# Patient Record
Sex: Female | Born: 1945 | Race: Black or African American | Hispanic: No | Smoking: Former smoker
Health system: Southern US, Community
[De-identification: ages and names within clinical notes are randomized; demographics above are authoritative.]

## PROBLEM LIST (undated history)

## (undated) DIAGNOSIS — N189 Chronic kidney disease, unspecified: Secondary | ICD-10-CM

## (undated) DIAGNOSIS — N200 Calculus of kidney: Secondary | ICD-10-CM

## (undated) DIAGNOSIS — K219 Gastro-esophageal reflux disease without esophagitis: Secondary | ICD-10-CM

## (undated) DIAGNOSIS — E782 Mixed hyperlipidemia: Secondary | ICD-10-CM

## (undated) DIAGNOSIS — I340 Nonrheumatic mitral (valve) insufficiency: Secondary | ICD-10-CM

## (undated) DIAGNOSIS — I5189 Other ill-defined heart diseases: Secondary | ICD-10-CM

## (undated) DIAGNOSIS — E1122 Type 2 diabetes mellitus with diabetic chronic kidney disease: Secondary | ICD-10-CM

## (undated) DIAGNOSIS — F329 Major depressive disorder, single episode, unspecified: Secondary | ICD-10-CM

## (undated) DIAGNOSIS — I071 Rheumatic tricuspid insufficiency: Secondary | ICD-10-CM

## (undated) DIAGNOSIS — I639 Cerebral infarction, unspecified: Secondary | ICD-10-CM

## (undated) DIAGNOSIS — N289 Disorder of kidney and ureter, unspecified: Secondary | ICD-10-CM

## (undated) DIAGNOSIS — F32A Depression, unspecified: Secondary | ICD-10-CM

## (undated) DIAGNOSIS — D631 Anemia in chronic kidney disease: Secondary | ICD-10-CM

## (undated) DIAGNOSIS — N10 Acute tubulo-interstitial nephritis: Secondary | ICD-10-CM

## (undated) DIAGNOSIS — I739 Peripheral vascular disease, unspecified: Secondary | ICD-10-CM

## (undated) DIAGNOSIS — I10 Essential (primary) hypertension: Secondary | ICD-10-CM

## (undated) DIAGNOSIS — E118 Type 2 diabetes mellitus with unspecified complications: Secondary | ICD-10-CM

## (undated) DIAGNOSIS — N1832 Chronic kidney disease, stage 3b: Secondary | ICD-10-CM

## (undated) HISTORY — PX: KIDNEY STONE SURGERY: SHX686

## (undated) HISTORY — DX: Calculus of kidney: N20.0

## (undated) HISTORY — DX: Essential (primary) hypertension: I10

## (undated) HISTORY — DX: Type 2 diabetes mellitus with unspecified complications: E11.8

## (undated) HISTORY — PX: CHOLECYSTECTOMY: SHX55

## (undated) HISTORY — DX: Mixed hyperlipidemia: E78.2

---
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
## 2006-11-29 ENCOUNTER — Emergency Department: Payer: Self-pay | Admitting: Emergency Medicine

## 2016-05-21 DIAGNOSIS — I639 Cerebral infarction, unspecified: Secondary | ICD-10-CM

## 2016-05-21 HISTORY — DX: Cerebral infarction, unspecified: I63.9

## 2016-10-01 ENCOUNTER — Inpatient Hospital Stay: Payer: Medicare Other

## 2016-10-01 ENCOUNTER — Emergency Department: Payer: Medicare Other

## 2016-10-01 ENCOUNTER — Encounter: Payer: Self-pay | Admitting: Emergency Medicine

## 2016-10-01 ENCOUNTER — Inpatient Hospital Stay
Admission: EM | Admit: 2016-10-01 | Discharge: 2016-10-02 | DRG: 065 | Disposition: A | Discharge status: Home / Self Care | Payer: Medicare Other | Attending: Internal Medicine | Admitting: Internal Medicine

## 2016-10-01 DIAGNOSIS — Z66 Do not resuscitate: Secondary | ICD-10-CM | POA: Diagnosis present

## 2016-10-01 DIAGNOSIS — Z8249 Family history of ischemic heart disease and other diseases of the circulatory system: Secondary | ICD-10-CM

## 2016-10-01 DIAGNOSIS — Z23 Encounter for immunization: Secondary | ICD-10-CM

## 2016-10-01 DIAGNOSIS — Z87891 Personal history of nicotine dependence: Secondary | ICD-10-CM | POA: Diagnosis not present

## 2016-10-01 DIAGNOSIS — R7301 Impaired fasting glucose: Secondary | ICD-10-CM | POA: Diagnosis present

## 2016-10-01 DIAGNOSIS — N390 Urinary tract infection, site not specified: Secondary | ICD-10-CM

## 2016-10-01 DIAGNOSIS — Z8673 Personal history of transient ischemic attack (TIA), and cerebral infarction without residual deficits: Secondary | ICD-10-CM | POA: Diagnosis present

## 2016-10-01 DIAGNOSIS — I10 Essential (primary) hypertension: Secondary | ICD-10-CM | POA: Diagnosis present

## 2016-10-01 DIAGNOSIS — H6122 Impacted cerumen, left ear: Secondary | ICD-10-CM | POA: Diagnosis present

## 2016-10-01 DIAGNOSIS — I639 Cerebral infarction, unspecified: Secondary | ICD-10-CM | POA: Diagnosis not present

## 2016-10-01 DIAGNOSIS — N3001 Acute cystitis with hematuria: Secondary | ICD-10-CM | POA: Diagnosis present

## 2016-10-01 DIAGNOSIS — I6350 Cerebral infarction due to unspecified occlusion or stenosis of unspecified cerebral artery: Secondary | ICD-10-CM | POA: Diagnosis not present

## 2016-10-01 DIAGNOSIS — R42 Dizziness and giddiness: Secondary | ICD-10-CM

## 2016-10-01 LAB — URINALYSIS, COMPLETE (UACMP) WITH MICROSCOPIC
Bilirubin Urine: NEGATIVE
Ketones, ur: NEGATIVE mg/dL
NITRITE: NEGATIVE
Protein, ur: 100 mg/dL — AB
Specific Gravity, Urine: 1.017 (ref 1.005–1.030)
pH: 5 (ref 5.0–8.0)

## 2016-10-01 LAB — BASIC METABOLIC PANEL
ANION GAP: 10 (ref 5–15)
BUN: 19 mg/dL (ref 6–20)
CALCIUM: 9.7 mg/dL (ref 8.9–10.3)
CO2: 23 mmol/L (ref 22–32)
CREATININE: 0.99 mg/dL (ref 0.44–1.00)
Chloride: 102 mmol/L (ref 101–111)
GFR, EST NON AFRICAN AMERICAN: 56 mL/min — AB (ref >60)
Glucose, Bld: 271 mg/dL — ABNORMAL HIGH (ref 65–99)
Potassium: 4.7 mmol/L (ref 3.5–5.1)
Sodium: 135 mmol/L (ref 135–145)

## 2016-10-01 LAB — CBC
HCT: 40.2 % (ref 35.0–47.0)
Hemoglobin: 14.1 g/dL (ref 12.0–16.0)
MCH: 30.6 pg (ref 26.0–34.0)
MCHC: 35.1 g/dL (ref 32.0–36.0)
MCV: 87.2 fL (ref 80.0–100.0)
Platelets: 344 10*3/uL (ref 150–440)
RBC: 4.61 MIL/uL (ref 3.80–5.20)
RDW: 12.5 % (ref 11.5–14.5)
WBC: 8.6 10*3/uL (ref 3.6–11.0)

## 2016-10-01 LAB — TROPONIN I: Troponin I: <0.03 ng/mL (ref <0.03)

## 2016-10-01 LAB — GLUCOSE, CAPILLARY
GLUCOSE-CAPILLARY: 220 mg/dL — AB (ref 65–99)
Glucose-Capillary: 203 mg/dL — ABNORMAL HIGH (ref 65–99)

## 2016-10-01 MED ORDER — ATORVASTATIN CALCIUM 20 MG PO TABS
40.0000 mg | ORAL_TABLET | Freq: Every day | ORAL | Status: DC
  Administered 2016-10-01 – 2016-10-02 (×2): 40 mg via ORAL
  Filled 2016-10-01 (×2): qty 2

## 2016-10-01 MED ORDER — ASPIRIN 81 MG PO CHEW
324.0000 mg | CHEWABLE_TABLET | Freq: Once | ORAL | Status: AC
  Administered 2016-10-01: 324 mg via ORAL
  Filled 2016-10-01: qty 4

## 2016-10-01 MED ORDER — ASPIRIN EC 81 MG PO TBEC
81.0000 mg | DELAYED_RELEASE_TABLET | Freq: Every day | ORAL | Status: DC
  Administered 2016-10-02 (×2): 81 mg via ORAL
  Filled 2016-10-01: qty 1

## 2016-10-01 MED ORDER — PNEUMOCOCCAL VAC POLYVALENT 25 MCG/0.5ML IJ INJ
0.5000 mL | INJECTION | INTRAMUSCULAR | Status: AC
  Administered 2016-10-02: 17:00:00 0.5 mL via INTRAMUSCULAR
  Filled 2016-10-01: qty 0.5

## 2016-10-01 MED ORDER — INSULIN ASPART 100 UNIT/ML ~~LOC~~ SOLN
0.0000 [IU] | Freq: Every day | SUBCUTANEOUS | Status: DC
  Administered 2016-10-01: 2 [IU] via SUBCUTANEOUS
  Filled 2016-10-01: qty 2

## 2016-10-01 MED ORDER — ACETAMINOPHEN 650 MG RE SUPP: 650.0000 mg | Freq: Four times a day (QID) | RECTAL | Status: DC | PRN

## 2016-10-01 MED ORDER — ONDANSETRON HCL 4 MG/2ML IJ SOLN: 4.0000 mg | Freq: Four times a day (QID) | INTRAMUSCULAR | Status: DC | PRN

## 2016-10-01 MED ORDER — MECLIZINE HCL 25 MG PO TABS
25.0000 mg | ORAL_TABLET | Freq: Once | ORAL | Status: AC
  Administered 2016-10-01: 25 mg via ORAL
  Filled 2016-10-01: qty 1

## 2016-10-01 MED ORDER — ENOXAPARIN SODIUM 40 MG/0.4ML ~~LOC~~ SOLN
40.0000 mg | SUBCUTANEOUS | Status: DC
  Administered 2016-10-01: 40 mg via SUBCUTANEOUS
  Filled 2016-10-01: qty 0.4

## 2016-10-01 MED ORDER — GLIPIZIDE ER 5 MG PO TB24
5.0000 mg | ORAL_TABLET | Freq: Every day | ORAL | Status: DC
  Administered 2016-10-02: 08:00:00 5 mg via ORAL
  Filled 2016-10-01: qty 1

## 2016-10-01 MED ORDER — DEXTROSE 5 % IV SOLN
1.0000 g | INTRAVENOUS | Status: DC
  Administered 2016-10-02: 08:00:00 1 g via INTRAVENOUS
  Filled 2016-10-01 (×2): qty 10

## 2016-10-01 MED ORDER — INSULIN ASPART 100 UNIT/ML ~~LOC~~ SOLN
0.0000 [IU] | Freq: Three times a day (TID) | SUBCUTANEOUS | Status: DC
  Administered 2016-10-01: 3 [IU] via SUBCUTANEOUS
  Administered 2016-10-02: 5 [IU] via SUBCUTANEOUS
  Administered 2016-10-02: 3 [IU] via SUBCUTANEOUS
  Administered 2016-10-02: 18:00:00 1 [IU] via SUBCUTANEOUS
  Filled 2016-10-01 (×2): qty 5
  Filled 2016-10-01: qty 1
  Filled 2016-10-01 (×2): qty 3

## 2016-10-01 MED ORDER — CEFTRIAXONE SODIUM IN DEXTROSE 20 MG/ML IV SOLN: 1.0000 g | INTRAVENOUS | Status: DC

## 2016-10-01 MED ORDER — CEFTRIAXONE SODIUM IN DEXTROSE 20 MG/ML IV SOLN
1.0000 g | Freq: Once | INTRAVENOUS | Status: AC
  Administered 2016-10-01: 1 g via INTRAVENOUS
  Filled 2016-10-01: qty 50

## 2016-10-01 MED ORDER — ONDANSETRON HCL 4 MG PO TABS: 4.0000 mg | ORAL_TABLET | Freq: Four times a day (QID) | ORAL | Status: DC | PRN

## 2016-10-01 MED ORDER — ACETAMINOPHEN 325 MG PO TABS: 650.0000 mg | ORAL_TABLET | Freq: Four times a day (QID) | ORAL | Status: DC | PRN

## 2016-10-01 NOTE — ED Notes (Signed)
MD notified of hypertension.  He states that this is permissive hypertension and unless over 220/110.  1C notified and pt will be taken up

## 2016-10-01 NOTE — ED Notes (Signed)
Pt taken to US

## 2016-10-01 NOTE — ED Notes (Signed)
Call to 1C 

## 2016-10-01 NOTE — ED Triage Notes (Signed)
Pt states over the weekend when she tried to stand up she would become dizzy and feel like she was going to fall back. States a month ago, her left leg became "heavy" as well. States slight headache this am, no slurred speech or other neuro deficits noted per family and assessment.

## 2016-10-01 NOTE — ED Notes (Signed)
Due to BP MD will be notified before pt is transferred up

## 2016-10-01 NOTE — ED Provider Notes (Signed)
[facility] Emergency Department Provider Note  ____________________________________________   First MD Initiated Contact with Patient 10/01/16 1246     (approximate)  I have reviewed the triage vital signs and the nursing notes.   HISTORY  Chief Complaint Dizziness and Headache   HPI Crystal Miller is a 71 y.o. female with a history of hypertension who is presenting to the emergency department with vertigo since this past Friday. She says that she feels dizzy and off balance when walking. No symptoms when she is laying flat. She also says that she has a mild right-sided headache but says that she has headaches almost every day. This is no different from her regular headaches. She says that she has been both the right as well as the left temple. Denies any blurred vision. Also says that she has pressure to her left ear which is chronic. Denies any ringing in her ears. Denies any recent illnesses. Says that she has "heaviness" to her left leg that has also been going on over the past month.   Past Medical History:  Diagnosis Date  . Hypertension     There are no active problems to display for this patient.   Past Surgical History:  Procedure Laterality Date  . CHOLECYSTECTOMY      Prior to Admission medications   Not on File    Allergies Patient has no known allergies.  No family history on file.  Social History Social History  Substance Use Topics  . Smoking status: Former Games developer  . Smokeless tobacco: Never Used  . Alcohol use No    Review of Systems  Constitutional: No fever/chills Eyes: No visual changes. ENT: No sore throat. Cardiovascular: Denies chest pain. Respiratory: Denies shortness of breath. Gastrointestinal: No abdominal pain.  No nausea, no vomiting.  No diarrhea.  No constipation. Genitourinary: Negative for dysuria. Musculoskeletal: Negative for back pain. Skin: Negative for rash. Neurological: Negative for  headaches, focal weakness or numbness.   ____________________________________________   PHYSICAL EXAM:  VITAL SIGNS: ED Triage Vitals [10/01/16 1041]  Enc Vitals Group     BP (!) 159/60     Pulse Rate 68     Resp 18     Temp 98 F (36.7 C)     Temp Source Oral     SpO2 98 %     Weight 144 lb (65.3 kg)     Height 5\' 3"  (1.6 m)     Head Circumference      Peak Flow      Pain Score 2     Pain Loc      Pain Edu?      Excl. in GC?     Constitutional: Alert and oriented. Well appearing and in no acute distress. Eyes: Conjunctivae are normal. PERRL. EOMI. Head: Atraumatic.No tenderness to palpation along the distribution of the temporal arteries. Cerumen impaction to the left external ear canal. Nose: No congestion/rhinnorhea. Mouth/Throat: Mucous membranes are moist.   Neck: No stridor.   Cardiovascular: Normal rate, regular rhythm. Grossly normal heart sounds.   Respiratory: Normal respiratory effort.  No retractions. Lungs CTAB. Gastrointestinal: Soft and nontender. No distention.  Musculoskeletal: No lower extremity tenderness nor edema.  No joint effusions. Neurologic:  Normal speech and language. Difficulty with heel-to-shin testing with range of motion of the left lower extremity. However, there is no ataxia to the bilateral upper extremities. No nystagmus. Skin:  Skin is warm, dry and intact. No rash noted. Psychiatric: Mood and affect are  normal. Speech and behavior are normal.  ____________________________________________   LABS (all labs ordered are listed, but only abnormal results are displayed)  Labs Reviewed  BASIC METABOLIC PANEL - Abnormal; Notable for the following:       Result Value   Glucose, Bld 271 (*)    GFR calc non Af Amer 56 (*)    All other components within normal limits  URINALYSIS, COMPLETE (UACMP) WITH MICROSCOPIC - Abnormal; Notable for the following:    Color, Urine YELLOW (*)    APPearance CLOUDY (*)    Glucose, UA >=500 (*)    Hgb  urine dipstick SMALL (*)    Protein, ur 100 (*)    Leukocytes, UA LARGE (*)    Bacteria, UA MANY (*)    Squamous Epithelial / LPF 6-30 (*)    Non Squamous Epithelial 0-5 (*)    All other components within normal limits  URINE CULTURE  CBC  TROPONIN I  CBG MONITORING, ED   ____________________________________________  EKG  ED ECG REPORT I, Arelia LongestSchaevitz,  Kearra Calkin M, the attending physician, personally viewed and interpreted this ECG.   Date: 10/01/2016  EKG Time: 1050  Rate: 69  Rhythm: normal sinus rhythm  Axis: Normal  Intervals:none  ST&T Change: No ST segment elevation or depression. No abnormal T-wave inversion.  ____________________________________________  RADIOLOGY  CT Head Wo Contrast (Final result)  Result time 10/01/16 13:56:33  Final result by Joellyn HaffPatel, Hetal P, MD (10/01/16 13:56:33)           Narrative:   CLINICAL DATA: Vertigo. Dizziness when standing.  EXAM: CT HEAD WITHOUT CONTRAST  TECHNIQUE: Contiguous axial images were obtained from the base of the skull through the vertex without intravenous contrast.  COMPARISON: None.  FINDINGS: Brain: No evidence of acute cortical infarction, hemorrhage, extra-axial collection, ventriculomegaly, or mass effect. Low attenuation in the right side of the pons concerning for a nonhemorrhagic acute versus subacute lacunar infarct. Generalized cerebral atrophy. Periventricular white matter low attenuation likely secondary to microangiopathy.  Vascular: Cerebrovascular atherosclerotic calcifications are noted.  Skull: Negative for fracture or focal lesion.  Sinuses/Orbits: Visualized portions of the orbits are unremarkable. Visualized portions of the paranasal sinuses and mastoid air cells are unremarkable.  Other: None.  IMPRESSION: 1. 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   Electronically Signed By: Elige KoHetal  Patel On: 10/01/2016 13:56            ____________________________________________   PROCEDURES  Procedure(s) performed:  Ceruminosis is noted to the left ext canal.  Wax is removed by syringing and manual debridement. Instructions for home care to prevent wax buildup are given.  Normal TM visualized in the left after cerumen removal. Normal TM on the right.   Procedures  Critical Care performed:   ____________________________________________   INITIAL IMPRESSION / ASSESSMENT AND PLAN / ED COURSE  Pertinent labs & imaging results that were available during my care of the patient were reviewed by me and considered in my medical decision making (see chart for details).  ----------------------------------------- 4:18 PM on 10/01/2016 -----------------------------------------  Patient's likely infarct correlates with her neurologic deficits. Also found to have UTI. She will be admitted to the hospital. We'll start on aspirin as well as Rocephin. I discussed the findings with the patient as well as family and they're understanding and willing to comply with the plan. Signed out to Dr. Elpidio AnisSudini of the medicine service.      ____________________________________________  FINAL CLINICAL IMPRESSION(S) / ED DIAGNOSES  Cerumen impaction. Vertigo. UTI. CVA.    NEW MEDICATIONS STARTED DURING THIS VISIT:  New Prescriptions   No medications on file     Note:  This document was prepared using Dragon voice recognition software and may include unintentional dictation errors.    Myrna Blazer, MD 10/01/16 [phone]

## 2016-10-01 NOTE — ED Notes (Signed)
Pt continues to feel dizzy. Unchanged from before meclizine.  Pt helped to bathroom to void

## 2016-10-01 NOTE — ED Notes (Addendum)
Pt states since Friday night she gets dizzy when standing. Only when standing. States hard to walk because she gets off balance. Also c/o pain in L leg. Family also states " don't forget the headache." PT states R temporal HA. Denies blurred vision. Alert, oriented.   Pt states no hx of HTN, no medication for HTN.

## 2016-10-01 NOTE — H&P (Addendum)
[facility] -  at [facility]   PATIENT NAME: Crystal Miller    MR#:  [id number]  DATE OF BIRTH:  [birth year]  DATE OF ADMISSION:  10/01/2016  PRIMARY CARE PHYSICIAN: Patient, No Pcp Per   REQUESTING/REFERRING PHYSICIAN: Dr. Loreli Dollar  CHIEF COMPLAINT:   Chief Complaint  Patient presents with  . Dizziness  . Headache    HISTORY OF PRESENT ILLNESS:  Crystal Miller  is a 71 y.o. female with No past medical history he states 3 days ago on Friday evening she started getting dizzy spells and couldn't walk. She's been having some balance issues since then. She has a slight headache. She does have some tightening in her left leg. She did have some pressure in her left ear and decreased hearing but the ER physician remove some wax and that has resolved that symptom. In the ER, CT scan showed an acute versus subacute stroke and hospitalist services were contacted for further evaluation.  PAST MEDICAL HISTORY:   Past Medical History:  Diagnosis Date  . Hypertension     PAST SURGICAL HISTORY:   Past Surgical History:  Procedure Laterality Date  . CHOLECYSTECTOMY      SOCIAL HISTORY:   Social History  Substance Use Topics  . Smoking status: Former Games developer  . Smokeless tobacco: Never Used  . Alcohol use No    FAMILY HISTORY:   Family History  Problem Relation Age of Onset  . Alzheimer's disease Mother   . CVA Father   . CAD Father     DRUG ALLERGIES:  No Known Allergies  REVIEW OF SYSTEMS:  CONSTITUTIONAL: No fever. Entire body weakness. Some weight loss. EYES: No blurred or double vision. Wears glasses. EARS, NOSE, AND THROAT: No tinnitus or ear pain. No sore throat. Decreased hearing secondary to earwax left ear. Positive for runny nose RESPIRATORY: Some cough. No shortness of breath, wheezing or hemoptysis.  CARDIOVASCULAR: No chest pain, orthopnea, edema.  GASTROINTESTINAL: No nausea, vomiting, diarrhea or abdominal pain. No blood  in bowel movements GENITOURINARY: No dysuria, hematuria.  ENDOCRINE: No polyuria, nocturia,  HEMATOLOGY: No anemia, easy bruising or bleeding SKIN: No rash or lesion. MUSCULOSKELETAL: Positive for joint pain in her hands   NEUROLOGIC: No tingling, numbness, weakness.  PSYCHIATRY: No anxiety or depression.   MEDICATIONS AT HOME:   Prior to Admission medications   Not on File    Patient does not take any medication  VITAL SIGNS:  Blood pressure (!) 178/76, pulse 65, temperature 98 F (36.7 C), temperature source Oral, resp. rate 15, height 5\' 3"  (1.6 m), weight 65.3 kg (144 lb), SpO2 97 %.  PHYSICAL EXAMINATION:  GENERAL:  71 y.o.-year-old patient lying in the bed with no acute distress.  EYES: Pupils equal, round, reactive to light and accommodation. No scleral icterus. Extraocular muscles intact.  HEENT: Head atraumatic, normocephalic. Oropharynx and nasopharynx clear.  NECK:  Supple, no jugular venous distention. No thyroid enlargement, no tenderness.  LUNGS: Normal breath sounds bilaterally, no wheezing, rales,rhonchi or crepitation. No use of accessory muscles of respiration.  CARDIOVASCULAR: S1, S2 normal. No murmurs, rubs, or gallops.  ABDOMEN: Soft, nontender, nondistended. Bowel sounds present. No organomegaly or mass.  EXTREMITIES: No pedal edema, cyanosis, or clubbing.  NEUROLOGIC: Cranial nerves II through XII are intact. Muscle strength 5/5 in all extremities. Sensation intact. Gait not checked.  PSYCHIATRIC: The patient is alert and oriented x 3.  SKIN: No rash, lesion, or ulcer.   LABORATORY PANEL:   CBC  Recent Labs Lab 10/01/16 1043  WBC 8.6  HGB 14.1  HCT 40.2  PLT 344   ------------------------------------------------------------------------------------------------------------------  Chemistries   Recent Labs Lab 10/01/16 1043  NA 135  K 4.7  CL 102  CO2 23  GLUCOSE 271*  BUN 19  CREATININE 0.99  CALCIUM 9.7    ------------------------------------------------------------------------------------------------------------------  Cardiac Enzymes  Recent Labs Lab 10/01/16 1400  TROPONINI <0.03   ------------------------------------------------------------------------------------------------------------------  RADIOLOGY:  Ct Head Wo Contrast  Result Date: 10/01/2016 CLINICAL DATA:  Vertigo.  Dizziness when standing. EXAM: CT HEAD WITHOUT CONTRAST TECHNIQUE: Contiguous axial images were obtained from the base of the skull through the vertex without intravenous contrast. COMPARISON:  None. FINDINGS: Brain: No evidence of acute cortical infarction, hemorrhage, extra-axial collection, ventriculomegaly, or mass effect. Low attenuation in the right side of the pons concerning for a nonhemorrhagic acute versus subacute lacunar infarct. Generalized cerebral atrophy. Periventricular white matter low attenuation likely secondary to microangiopathy. Vascular: Cerebrovascular atherosclerotic calcifications are noted. Skull: Negative for fracture or focal lesion. Sinuses/Orbits: Visualized portions of the orbits are unremarkable. Visualized portions of the paranasal sinuses and mastoid air cells are unremarkable. Other: None. IMPRESSION: 1. 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 Electronically Signed   By: Elige KoHetal  Patel   On: 10/01/2016 13:56    EKG:   Normal sinus rhythm 69 bpm  IMPRESSION AND PLAN:   1. Acute versus subacute stroke. Obtain MRI of the brain, echocardiogram and carotid ultrasound. Obtain physical therapy and occupational therapy consultations. Since the patient is aspirin nave we'll continue aspirin on a daily basis start Lipitor and check a lipid profile in the a.m. Depending on how she does with physical therapy further recommendations will be based on this. Patient states her main issue is balance. Monitor on  telemetry for arrhythmias. 2. Acute cystitis with hematuria. IV Rocephin. Follow-up urine culture 3. Impaired fasting glucose with a glucose of 271 we'll check a hemoglobin A1c and place on sliding scale at this point. Patient most likely a diabetic with the sugar. Potentially can start low-dose glipizide 4. Essential hypertension. Allow permissive hypertension. Do not treat blood pressure unless greater than 220/120.  All the records are reviewed and case discussed with ED provider. Management plans discussed with the patient, family and they are in agreement.  CODE STATUS: DO NOT RESUSCITATE  TOTAL TIME TAKING CARE OF THIS PATIENT: 55 minutes.    Alford HighlandWIETING, Tyrin Herbers M.D on 10/01/2016 at 4:48 PM  Between 7am to 6pm - Pager - [phone]  After 6pm call admission pager [phone]  [facility] Office  [phone]  CC: Primary care physician; Patient, No Pcp Per

## 2016-10-02 ENCOUNTER — Inpatient Hospital Stay (HOSPITAL_COMMUNITY)
Admit: 2016-10-02 | Discharge: 2016-10-02 | Disposition: A | Discharge status: Home / Self Care | Payer: Medicare Other | Attending: Internal Medicine | Admitting: Internal Medicine

## 2016-10-02 ENCOUNTER — Inpatient Hospital Stay: Payer: Medicare Other

## 2016-10-02 DIAGNOSIS — I6350 Cerebral infarction due to unspecified occlusion or stenosis of unspecified cerebral artery: Secondary | ICD-10-CM

## 2016-10-02 LAB — CBC
HEMATOCRIT: 37.2 % (ref 35.0–47.0)
HEMOGLOBIN: 13.2 g/dL (ref 12.0–16.0)
MCH: 31.1 pg (ref 26.0–34.0)
MCHC: 35.4 g/dL (ref 32.0–36.0)
MCV: 87.7 fL (ref 80.0–100.0)
Platelets: 298 10*3/uL (ref 150–440)
RBC: 4.23 MIL/uL (ref 3.80–5.20)
RDW: 12.4 % (ref 11.5–14.5)
WBC: 7.2 10*3/uL (ref 3.6–11.0)

## 2016-10-02 LAB — ECHOCARDIOGRAM COMPLETE
HEIGHTINCHES: 63 in
WEIGHTICAEL: 2339.2 [oz_av]

## 2016-10-02 LAB — LIPID PANEL
CHOL/HDL RATIO: 7.2 ratio
Cholesterol: 179 mg/dL (ref 0–200)
HDL: 25 mg/dL — AB (ref >40)
LDL CALC: UNDETERMINED mg/dL (ref 0–99)
TRIGLYCERIDES: 496 mg/dL — AB (ref <150)
VLDL: UNDETERMINED mg/dL (ref 0–40)

## 2016-10-02 LAB — BASIC METABOLIC PANEL
Anion gap: 8 (ref 5–15)
BUN: 22 mg/dL — ABNORMAL HIGH (ref 6–20)
CHLORIDE: 103 mmol/L (ref 101–111)
CO2: 22 mmol/L (ref 22–32)
Calcium: 9.3 mg/dL (ref 8.9–10.3)
Creatinine, Ser: 0.93 mg/dL (ref 0.44–1.00)
GFR calc Af Amer: >60 mL/min (ref >60)
GFR, EST NON AFRICAN AMERICAN: 60 mL/min — AB (ref >60)
Glucose, Bld: 326 mg/dL — ABNORMAL HIGH (ref 65–99)
Potassium: 4.7 mmol/L (ref 3.5–5.1)
SODIUM: 133 mmol/L — AB (ref 135–145)

## 2016-10-02 LAB — GLUCOSE, CAPILLARY
GLUCOSE-CAPILLARY: 262 mg/dL — AB (ref 65–99)
GLUCOSE-CAPILLARY: 131 mg/dL — AB (ref 65–99)
Glucose-Capillary: 233 mg/dL — ABNORMAL HIGH (ref 65–99)

## 2016-10-02 LAB — HEMOGLOBIN A1C
Hgb A1c MFr Bld: 9.7 % — ABNORMAL HIGH (ref 4.8–5.6)
MEAN PLASMA GLUCOSE: 232 mg/dL

## 2016-10-02 MED ORDER — LIVING WELL WITH DIABETES BOOK
Freq: Once | Status: AC
  Administered 2016-10-02: 17:00:00
  Filled 2016-10-02: qty 1

## 2016-10-02 MED ORDER — GLIPIZIDE ER 5 MG PO TB24: 5.0000 mg | ORAL_TABLET | Freq: Every day | ORAL | 2 refills | Status: DC

## 2016-10-02 MED ORDER — ASPIRIN 81 MG PO TBEC: 81.0000 mg | DELAYED_RELEASE_TABLET | Freq: Every day | ORAL | 2 refills | Status: AC

## 2016-10-02 MED ORDER — CEFUROXIME AXETIL 250 MG PO TABS: 250.0000 mg | ORAL_TABLET | Freq: Two times a day (BID) | ORAL | 0 refills | Status: AC

## 2016-10-02 MED ORDER — LISINOPRIL 20 MG PO TABS: 20.0000 mg | ORAL_TABLET | Freq: Every day | ORAL | 2 refills | Status: DC

## 2016-10-02 MED ORDER — ATORVASTATIN CALCIUM 40 MG PO TABS: 40.0000 mg | ORAL_TABLET | Freq: Every day | ORAL | 2 refills | Status: DC

## 2016-10-02 NOTE — Progress Notes (Signed)
Pt d/ced home after positive diagnosis for stroke.  She is going home with [facility] PT/OT.  Will be using a walker.  Pt now has PCP and has made f/u appt.  NIHSS = 5.  Pt has good strength on L side but has movement difficulty.  Was SR on 60s-80s.  Pt's daughter and grandson lives with her.  Will get diabetes education book for her before she leaves.  IV removed.  Prescriptions given and PNA shot administered.  Pt will go home w/daughter.

## 2016-10-02 NOTE — Progress Notes (Signed)
Chaplain prayed for patient and a family member who was present in the room. When the nurse entered the room she said that the order she did was for an Advance Directive, but there was no notes regarding an AD. Chaplain went back to provide education about AD.   10/02/16 0700  Clinical Encounter Type  Visited With Patient and family together  Visit Type Initial  Referral From Nurse  Consult/Referral To Chaplain  Spiritual Encounters  Spiritual Needs Prayer

## 2016-10-02 NOTE — Discharge Summary (Signed)
[facility] [place] at [facility]   PATIENT NAME: Crystal Miller    MR#:  [id number]  DATE OF BIRTH:  08/11/1945  DATE OF ADMISSION:  10/01/2016 ADMITTING PHYSICIAN: Alford Highland, MD  DATE OF DISCHARGE: 10/02/2016  PRIMARY CARE PHYSICIAN: Patient, No Pcp Per    ADMISSION DIAGNOSIS:  Vertigo [R42] CVA (cerebral vascular accident) (HCC) [I63.9] Impacted cerumen of left ear [H61.22] Urinary tract infection without hematuria, site unspecified [N39.0] Cerebrovascular accident (CVA), unspecified mechanism (HCC) [I63.9]  DISCHARGE DIAGNOSIS:  Active Problems:   CVA (cerebral vascular accident) (HCC)   SECONDARY DIAGNOSIS:   Past Medical History:  Diagnosis Date  . Hypertension     HOSPITAL COURSE:   1. Acute stroke-   Confirmed by MRI, echocardiogram is done, result is pending.   Carotid Doppler study does not show any stenosis   some weakness while getting up and walking so physical therapy suggested arrangement of home health and occupational therapy.   Started on aspirin, statin, blood pressure control.   Counseled her about medication compliance and regular follow-up with primary care physician. 2. Acute cystitis with hematuria. IV Rocephin. Follow-up urine culture- give oral cefuroxime on discharge. 3. Impaired fasting glucose with a glucose of 271 , High hemoglobin A1c   Also discharging on oral glipizide.   I counseled patient about the diabetic diet and regular follow-up with her primary care physician as she may not need much medicine being new diagnosis of diabetes. 4. Essential hypertension. Allowed initially  permissive hypertension.We will discharge on oral lisinopril, advise dietary control and follow up with primary care physician.  DRUG ALLERGIES:  No Known Allergies  DISCHARGE MEDICATIONS:   Current Discharge Medication List    START taking these medications   Details  aspirin EC 81 MG EC tablet Take 1 tablet (81 mg total) by  mouth daily. Qty: 30 tablet, Refills: 2    atorvastatin (LIPITOR) 40 MG tablet Take 1 tablet (40 mg total) by mouth daily at 6 PM. Qty: 30 tablet, Refills: 2    cefUROXime (CEFTIN) 250 MG tablet Take 1 tablet (250 mg total) by mouth 2 (two) times daily with a meal. Qty: 8 tablet, Refills: 0    glipiZIDE (GLUCOTROL XL) 5 MG 24 hr tablet Take 1 tablet (5 mg total) by mouth daily with breakfast. Qty: 30 tablet, Refills: 2    lisinopril (PRINIVIL,ZESTRIL) 20 MG tablet Take 1 tablet (20 mg total) by mouth daily. Qty: 30 tablet, Refills: 2         DISCHARGE INSTRUCTIONS:  Follow with primary care physician in one month.  If you experience worsening of your admission symptoms, develop shortness of breath, life threatening emergency, suicidal or homicidal thoughts you must seek medical attention immediately by calling 911 or calling your MD immediately  if symptoms less severe.  You Must read complete instructions/literature along with all the possible adverse reactions/side effects for all the Medicines you take and that have been prescribed to you. Take any new Medicines after you have completely understood and accept all the possible adverse reactions/side effects.   Please note  You were cared for by a hospitalist during your hospital stay. If you have any questions about your discharge medications or the care you received while you were in the hospital after you are discharged, you can call the unit and asked to speak with the hospitalist on call if the hospitalist that took care of you is not available. Once you are discharged, your primary care  physician will handle any further medical issues. Please note that NO REFILLS for any discharge medications will be authorized once you are discharged, as it is imperative that you return to your primary care physician (or establish a relationship with a primary care physician if you do not have one) for your aftercare needs so that they can  reassess your need for medications and monitor your lab values.    Today   CHIEF COMPLAINT:   Chief Complaint  Patient presents with  . Dizziness  . Headache    HISTORY OF PRESENT ILLNESS:  Crystal Miller  is a 71 y.o. female with No past medical history he states 3 days ago on Friday evening she started getting dizzy spells and couldn't walk. She's been having some balance issues since then. She has a slight headache. She does have some tightening in her left leg. She did have some pressure in her left ear and decreased hearing but the ER physician remove some wax and that has resolved that symptom. In the ER, CT scan showed an acute versus subacute stroke and hospitalist services were contacted for further evaluation.    VITAL SIGNS:  Blood pressure (!) 167/59, pulse 72, temperature 98.3 F (36.8 C), temperature source Oral, resp. rate 16, height 5\' 3"  (1.6 m), weight 66.3 kg (146 lb 3.2 oz), SpO2 95 %.  I/O:   Intake/Output Summary (Last 24 hours) at 10/02/16 1558 Last data filed at 10/02/16 1353  Gross per 24 hour  Intake              530 ml  Output                0 ml  Net              530 ml    PHYSICAL EXAMINATION:  GENERAL:  71 y.o.-year-old patient lying in the bed with no acute distress.  EYES: Pupils equal, round, reactive to light and accommodation. No scleral icterus. Extraocular muscles intact.  HEENT: Head atraumatic, normocephalic. Oropharynx and nasopharynx clear.  NECK:  Supple, no jugular venous distention. No thyroid enlargement, no tenderness.  LUNGS: Normal breath sounds bilaterally, no wheezing, rales,rhonchi or crepitation. No use of accessory muscles of respiration.  CARDIOVASCULAR: S1, S2 normal. No murmurs, rubs, or gallops.  ABDOMEN: Soft, non-tender, non-distended. Bowel sounds present. No organomegaly or mass.  EXTREMITIES: No pedal edema, cyanosis, or clubbing.  NEUROLOGIC: Cranial nerves II through XII are intact. Muscle strength 5/5 in all  extremities. Sensation intact. Gait not checked.  PSYCHIATRIC: The patient is alert and oriented x 3.  SKIN: No obvious rash, lesion, or ulcer.   DATA REVIEW:   CBC  Recent Labs Lab 10/02/16 0549  WBC 7.2  HGB 13.2  HCT 37.2  PLT 298    Chemistries   Recent Labs Lab 10/02/16 0549  NA 133*  K 4.7  CL 103  CO2 22  GLUCOSE 326*  BUN 22*  CREATININE 0.93  CALCIUM 9.3    Cardiac Enzymes  Recent Labs Lab 10/01/16 1400  TROPONINI <0.03    Microbiology Results  No results found for this or any previous visit.  RADIOLOGY:  Ct Head Wo Contrast  Result Date: 10/01/2016 CLINICAL DATA:  Vertigo.  Dizziness when standing. EXAM: CT HEAD WITHOUT CONTRAST TECHNIQUE: Contiguous axial images were obtained from the base of the skull through the vertex without intravenous contrast. COMPARISON:  None. FINDINGS: Brain: No evidence of acute cortical infarction, hemorrhage, extra-axial collection, ventriculomegaly, or mass  effect. Low attenuation in the right side of the pons concerning for a nonhemorrhagic acute versus subacute lacunar infarct. Generalized cerebral atrophy. Periventricular white matter low attenuation likely secondary to microangiopathy. Vascular: Cerebrovascular atherosclerotic calcifications are noted. Skull: Negative for fracture or focal lesion. Sinuses/Orbits: Visualized portions of the orbits are unremarkable. Visualized portions of the paranasal sinuses and mastoid air cells are unremarkable. Other: None. IMPRESSION: 1. 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 Electronically Signed   By: Elige KoHetal  Patel   On: 10/01/2016 13:56   Mr Brain Wo Contrast  Result Date: 10/02/2016 CLINICAL DATA:  Development of dizziness and gait disturbance 3 days ago. Headache. EXAM: MRI HEAD WITHOUT CONTRAST TECHNIQUE: Multiplanar, multiecho pulse sequences of the brain and surrounding structures were  obtained without intravenous contrast. COMPARISON:  Head CT 10/01/2016 FINDINGS: Brain: Acute infarction within the right para median pons. This measures 1 x 1 x 2 cm. No other acute infarction. The brainstem appears otherwise normal. No cerebellar abnormality. Cerebral hemispheres show mild chronic small-vessel change of the deep white matter. No cortical or large vessel territory infarction. No mass lesion, hemorrhage, hydrocephalus or extra-axial collection. Vascular: Major vessels at the base of the brain show flow. Skull and upper cervical spine: Negative Sinuses/Orbits: Clear/normal Other: None IMPRESSION: 1 x 1 x 2 cm acute infarction in the right para median pons. Mild swelling but no evidence of hemorrhage. No other acute infarction. Mild chronic small-vessel ischemic change of the cerebral hemispheric white matter. Electronically Signed   By: Paulina FusiMark  Shogry M.D.   On: 10/02/2016 10:09   [place] Carotid Bilateral  Result Date: 10/01/2016 CLINICAL DATA:  Dizziness, cerebrovascular accident EXAM: BILATERAL CAROTID DUPLEX ULTRASOUND TECHNIQUE: Wallace CullensGray scale imaging, color Doppler and duplex ultrasound were performed of bilateral carotid and vertebral arteries in the neck. COMPARISON:  None. FINDINGS: Criteria: Quantification of carotid stenosis is based on velocity parameters that correlate the residual internal carotid diameter with NASCET-based stenosis levels, using the diameter of the distal internal carotid lumen as the denominator for stenosis measurement. The following velocity measurements were obtained: RIGHT ICA:  129 cm/sec CCA:  82 cm/sec SYSTOLIC ICA/CCA RATIO:  1.6 DIASTOLIC ICA/CCA RATIO:  2 ECA:  100 cm/sec LEFT ICA:  93 cm/sec CCA:  114 cm/sec SYSTOLIC ICA/CCA RATIO:  0.8 DIASTOLIC ICA/CCA RATIO:  1.4 ECA:  170 cm/sec RIGHT CAROTID ARTERY: Smooth circumferential soft plaque is noted within the distal right CCA, carotid bulb and proximal ECA of less than 50% luminal narrowing. RIGHT VERTEBRAL ARTERY:   Antegrade LEFT CAROTID ARTERY: Small focus of calcified plaque in the mid common carotid artery with soft smooth plaque noted distal to this within the distal CCA. The ICA is tortuous in appearance. LEFT VERTEBRAL ARTERY:  Antegrade IMPRESSION: 1. Tortuous appearing left internal carotid artery which is believed contribute to elevated velocities on the left. 2. Less than 50% stenosis estimated of the internal carotid arteries bilaterally. 3. Antegrade flow seen within both vertebral arteries. Electronically Signed   By: Tollie Ethavid  Kwon M.D.   On: 10/01/2016 18:19   [place] Venous Img Lower Unilateral Left  Result Date: 10/01/2016 CLINICAL DATA:  Left lower extremity pain for several months. EXAM: LEFT LOWER EXTREMITY VENOUS DOPPLER ULTRASOUND TECHNIQUE: Gray-scale sonography with graded compression, as well as color Doppler and duplex ultrasound were performed to evaluate the lower extremity deep venous systems from the level of the common femoral vein and including the common femoral, femoral, profunda femoral,  popliteal and calf veins including the posterior tibial, peroneal and gastrocnemius veins when visible. The superficial great saphenous vein was also interrogated. Spectral Doppler was utilized to evaluate flow at rest and with distal augmentation maneuvers in the common femoral, femoral and popliteal veins. COMPARISON:  None. FINDINGS: Contralateral Common Femoral Vein: Respiratory phasicity is normal and symmetric with the symptomatic side. No evidence of thrombus. Normal compressibility. Common Femoral Vein: No evidence of thrombus. Normal compressibility, respiratory phasicity and response to augmentation. Saphenofemoral Junction: No evidence of thrombus. Normal compressibility and flow on color Doppler imaging. Profunda Femoral Vein: No evidence of thrombus. Normal compressibility and flow on color Doppler imaging. Femoral Vein: No evidence of thrombus. Normal compressibility, respiratory phasicity and  response to augmentation. Popliteal Vein: No evidence of thrombus. Normal compressibility, respiratory phasicity and response to augmentation. Calf Veins: No evidence of thrombus. Normal compressibility and flow on color Doppler imaging. Superficial Great Saphenous Vein: No evidence of thrombus. Normal compressibility and flow on color Doppler imaging. Venous Reflux:  None. Other Findings:  None. IMPRESSION: No evidence of DVT within the left lower extremity. Electronically Signed   By: Amie Portland M.D.   On: 10/01/2016 17:43    EKG:   Orders placed or performed during the hospital encounter of 10/01/16  . ED EKG  . ED EKG      Management plans discussed with the patient, family and they are in agreement.  CODE STATUS:     Code Status Orders        Start     Ordered   10/01/16 1645  Do not attempt resuscitation (DNR)  Continuous    Question Answer Comment  In the event of cardiac or respiratory ARREST Do not call a "code blue"   In the event of cardiac or respiratory ARREST Do not perform Intubation, CPR, defibrillation or ACLS   In the event of cardiac or respiratory ARREST Use medication by any route, position, wound care, and other measures to relive pain and suffering. May use oxygen, suction and manual treatment of airway obstruction as needed for comfort.   Comments Nurse may pronounce      10/01/16 1645    Code Status History    Date Active Date Inactive Code Status Order ID Comments User Context   This patient has a current code status but no historical code status.      TOTAL TIME TAKING CARE OF THIS PATIENT: 35 minutes.    Altamese Dilling M.D on 10/02/2016 at 3:58 PM  Between 7am to 6pm - Pager - [phone]  After 6pm go to [email or web address] - password [email or web address]  [facility]  Office  [phone]  CC: Primary care physician; Patient, No Pcp Per   Note: This dictation was prepared with Dragon dictation along with smaller phrase  technology. Any transcriptional errors that result from this process are unintentional.

## 2016-10-02 NOTE — Progress Notes (Signed)
Initial Nutrition Assessment  DOCUMENTATION CODES:   Not applicable  INTERVENTION:  Encouraged ongoing intake of adequate calories and protein at meals. Patient has met 100% of estimated needs in the past 24 hours and is well-nourished on exam (no fat or muscle wasting noted).  If patient has days with poor appetite/PO intake recommend drinking Glucerna or other oral nutrition supplement. Discussed recommendation with patient but will not place order for here as she is meeting estimated needs at this time.  RD can provide education on Carbohydrate Counting when MD has discussed new diagnosis of DM type 2 with patient. Discussed with RN can place consult for the education any time during admission.  NUTRITION DIAGNOSIS:   Inadequate oral intake related to poor appetite, social / environmental circumstances (stress due to loss of 3 family members) as evidenced by per patient/family report, reported weight loss of 23% body weight in the past year.  GOAL:   Patient will meet greater than or equal to 90% of their needs  MONITOR:   PO intake, Supplement acceptance, Labs, Weight trends, I & O's  REASON FOR ASSESSMENT:   Malnutrition Screening Tool    ASSESSMENT:   71 year old female with no PMHx presents with 3 days of dizziness, headache found to have acute versus subacute stroke. Also with new diagnosis of DM type 2.   Met with patient at bedside. She reports her appetite has been poor for a while now due to family stress. She lives by herself now because her husband and two other family members passed away within the past year. Patient reports it is hard to prepare meals for herself and she occasionally has days where she will go all day without eating. Lately she has been eating out at restaurants such as [employer] so she does not have to prepare food. Reports eating mainly two meals per day, but sometimes less due to stress. Could not provide any further details on typical intake.  Reports she has dentures but does not like to wear them. The only food she can't eat per report without dentures is apples.   Patient reports her UBW was around 190 lbs one year ago. No weight history in chart. Per report this is a weight loss of 43.8 lbs (23% body weight) over one year, which is significant for time frame.  Meal Completion: 100%  In the past 24 hours patient has had 1375 kcal (100% estimated kcal needs) and 88 grams of protein (100% estimated protein needs).  Medications reviewed and include: Glipizide 5 mg daily, Novolog sliding scale TID and QHS, ceftriaxone.  Labs reviewed: CBG 203-233 past 24 hrs, Sodium 133, BUN 22. HgbA1c 9.7 on 10/01/2016.  Nutrition-Focused physical exam completed. Findings are no fat depletion, no muscle depletion, and no edema. Unsure if reported weight loss is accurate (could have either been less weight loss or over a longer time period than reported) as with that significant of weight loss would expect to see some level of fat or muscle depletion on physical exam.  Discussed with RN. Unsure if MD has told patient yet about new diagnosis of DM. Encouraged to place consult for DM education once MD has discussed with patient.  Diet Order:  Diet 2 gram sodium Room service appropriate? Yes; Fluid consistency: Thin  Skin:  Reviewed, no issues  Last BM:  PTA  Height:   Ht Readings from Last 1 Encounters:  10/01/16 '5\' 3"'  (1.6 m)    Weight:   Wt Readings from  Last 1 Encounters:  10/01/16 146 lb 3.2 oz (66.3 kg)    Ideal Body Weight:  52.3 kg  BMI:  Body mass index is 25.9 kg/m.  Estimated Nutritional Needs:   Kcal:  1375-1605 (MSJ x 1.2-1.4)  Protein:  65-80 grams (1-1.2 grams/kg)  Fluid:  1.6 L/day (25 ml/kg)  EDUCATION NEEDS:   Education needs no appropriate at this time (Can provide once MD discusses new dx of DM with patient.)  Willey Blade, MS, RD, LDN Pager: [phone] After Hours Pager: [phone]

## 2016-10-02 NOTE — Progress Notes (Signed)
Chaplain received and order to visit with pt in room 127. Chaplain provided information for an Advanced Directive.    10/02/16 0920  Clinical Encounter Type  Visited With Patient  Visit Type Initial  Referral From Nurse  Consult/Referral To Chaplain  Spiritual Encounters  Spiritual Needs Other (Comment)

## 2016-10-02 NOTE — Care Management Note (Addendum)
Case Management Note  Patient Details  Name: Crystal Miller MRN: [id number] Date of Birth: 01/28/1946  Subjective/Objective:      Admitted to this facility with the diagnosis of CVA. Daughter will be staying with [name]. Sister is Clide CliffRicky [phone] or [phone] No primary care physician. Now has an appointment [facility].  Information given on physicians accepting new patients. Will be [facility] [address] for prescriptions. Takes care of all basic and instrumental activities of daily living herself, drives. No falls. Good appetite. No home services in the past. No skilled facility. No equipment in the home. Family will transport.                Action/Plan:   Physical therapy and occupational therapy evaluation completed. Recommended both services in the home. Recommended rolling walker and bedside commode.    Expected Discharge Date:  10/03/16               Expected Discharge Plan:     In-House Referral:   yes  Discharge planning Services   yes  Post Acute Care Choice:   Home with Home Health, physical therapy and occupational therapy.  Choice offered to:   yes  DME Arranged:   yes DME Agency:   [facility]  HH Arranged:   yes HH Agency[facility]  Status of Service:   Agency notified  If discussed at Long Length of Stay Meetings, dates discussed:  No   Additional Comments:  Discharge to home today per Dr. Wallie RenshawVachhani  Yeslin Delio S Garlene Apperson, RN MSN CCM Care management [phone] 10/02/2016, 3:53 PM

## 2016-10-02 NOTE — Progress Notes (Signed)
Inpatient Diabetes Program Recommendations  AACE/ADA: New Consensus Statement on Inpatient Glycemic Control (2015)  Target Ranges:  Prepandial:   less than 140 mg/dL      Peak postprandial:   less than 180 mg/dL (1-2 hours)      Critically ill patients:  140 - 180 mg/dL   Lab Results  Component Value Date   GLUCAP 262 (H) 10/02/2016   HGBA1C 9.7 (H) 10/01/2016    Review of Glycemic Control  Results for Crystal Miller, Crystal Miller (MRN [id number]) as of 10/02/2016 08:22  Ref. Range 10/01/2016 18:45 10/01/2016 23:37 10/02/2016 07:29  Glucose-Capillary Latest Ref Range: 65 - 99 mg/dL 295220 (H) 284203 (H) 132262 (H)    Diabetes history: none  Elevated A1C (6.5 or greater). Per ADA guidelines this meets the criteria of a diagnosis for diabetes. If appropriate, please consider placing this diagnosis in chart and inform patient of diagnosis.   Outpatient Diabetes medications: none Current orders for Inpatient glycemic control: Glucotrol 5mg  with breakfast, Novolog 0-9 units tid, Novolog 0-5 units qhs  Inpatient Diabetes Program Recommendations:  Consider adding low dose basal insulin, 7 units qhs- fasting blood sugar 262mg /dl.    Once the A1C is discussed with patient, consider ordering inpatient dietitian consult and outpatient diabetes education.    Patient will need a prescription for a meter , lancets and strips at discharge. May need Metformin at discharge if not contraindicated.   I will order Living well with diabetes when appropriate.   Susette RacerJulie Sharisse Rantz, RN, BA, MHA, CDE Diabetes Coordinator Inpatient Diabetes Program  [phone] (Team Pager) [phone] [facility] Office) 10/02/2016 8:27 AM

## 2016-10-02 NOTE — Evaluation (Signed)
Occupational Therapy Evaluation Patient Details Name: Crystal Miller MRN: [id number] DOB: 22-Nov-1945 Today's Date: 10/02/2016    History of Present Illness 71 y.o. female with No past medical history he states 3 days ago on Friday evening she started getting dizzy spells and couldn't walk. She's been having some balance issues since then. She has a slight headache. In the ER, CT scan showed an acute versus subacute stroke and hospitalist services were contacted for further evaluation.   Clinical Impression   Pt seen for OT evaluation this date. Pt presents with slightly decreased coordination with gross and fine motor skills of LUE (non dominant), tingling in toes of L foot, impaired balance, and slight dizziness noted upon sitting EOB but quickly resolved.  Pt has decreased speed with finger to thumb opposition and with finger to nose coordination. Pt would benefit from skilled OT services to address ADL training, fine motor skills training, adaptive equipment training, strengthening, and family ed and training in order to maximize return to PLOF and minimize risk of falls and increased caregiver burden.  Pt would benefit from [facility] after discharge from hospital.    Follow Up Recommendations  Home health OT    Equipment Recommendations  3 in 1 bedside commode    Recommendations for Other Services       Precautions / Restrictions Precautions Precautions: Fall Restrictions Weight Bearing Restrictions: No      Mobility Bed Mobility Overal bed mobility: Needs Assistance Bed Mobility: Supine to Sit;Sit to Supine     Supine to sit: Supervision Sit to supine: Supervision   General bed mobility comments: slight dizziness upon sitting EOB, resolving within 3 minutes; pt educated in body positioning and falls prevention strategies   Transfers Overall transfer level: Needs assistance Equipment used: 1 person hand held assist Transfers: Sit to/from Stand Sit to Stand: Min guard          General transfer comment: slight unsteadiness noted, cues for hand placement to support balance and safety    Balance Overall balance assessment: Needs assistance Sitting-balance support: Feet supported;Single extremity supported Sitting balance-Leahy Scale: Good     Standing balance support: During functional activity;Single extremity supported Standing balance-Leahy Scale: Fair Standing balance comment: slight unsteadiness noted, better with 1 handheld assist                           ADL either performed or assessed with clinical judgement   ADL Overall ADL's : Needs assistance/impaired Eating/Feeding: Sitting;Set up   Grooming: Standing;Wash/dry face;Min guard   Upper Body Bathing: Sitting;Set up;Supervision/ safety   Lower Body Bathing: Minimal assistance;Sitting/lateral leans;Sit to/from stand   Upper Body Dressing : Set up;Supervision/safety;Sitting   Lower Body Dressing: Min guard;Sitting/lateral leans;Sit to/from stand   Toilet Transfer: Solicitor;Ambulation;Grab bars Toilet Transfer Details (indicate cue type and reason): x1 handheld min guard with verbal cues for sequencing to maximize safety Toileting- Clothing Manipulation and Hygiene: Sitting/lateral lean;Supervision/safety       Functional mobility during ADLs: Min guard (x1 handheld min guard) General ADL Comments: pt generally min assist for LB dressing, slight balance deficits and slight increased time to follow simple commands     Vision Baseline Vision/History: Wears glasses Wears Glasses: Reading only Patient Visual Report: No change from baseline Vision Assessment?: Yes Eye Alignment: Within Functional Limits Ocular Range of Motion: Within Functional Limits Alignment/Gaze Preference: Within Defined Limits Tracking/Visual Pursuits: Decreased smoothness of vertical tracking;Decreased smoothness of horizontal tracking;Requires cues, head turns, or  add eye shifts to  track;Impaired - to be further tested in functional context Convergence: Impaired - to be further tested in functional context Visual Fields: No apparent deficits     Perception     Praxis      Pertinent Vitals/Pain Pain Assessment: No/denies pain     Hand Dominance Right   Extremity/Trunk Assessment Upper Extremity Assessment Upper Extremity Assessment: LUE deficits/detail (RUE grossly WFL) LUE Deficits / Details: L shoulder flexion approx 3+/5; L shoulder add/abduction, elbow flex/extension and grip strength WFL and equal bilaterally; intact sensation bilaterally; slightly decreased coordination with hand/eye testing and finger to thumb opposition  LUE Coordination: decreased fine motor   Lower Extremity Assessment Lower Extremity Assessment: Defer to PT evaluation;Overall WFL for tasks assessed;LLE deficits/detail (slight tingling in toes on L foot noted; grossly even strength bilaterally) LLE Sensation:  (slight tingling in L toes)   Cervical / Trunk Assessment Cervical / Trunk Assessment: Normal   Communication Communication Communication: Other (comment) (slightly slurred speech, pt endorses this is not her baseline)   Cognition Arousal/Alertness: Awake/alert Behavior During Therapy: WFL for tasks assessed/performed Overall Cognitive Status: Impaired/Different from baseline Area of Impairment: Following commands                       Following Commands: Follows multi-step commands inconsistently;Follows one step commands with increased time;Follows multi-step commands with increased time;Follows one step commands consistently           General Comments       Exercises     Shoulder Instructions      Home Living Family/patient expects to be discharged to:: Private residence Living Arrangements: Children (daughter and 3yo ggrandson live with her; spouse passed away in Nov 2017) Available Help at Discharge: Family;Available PRN/intermittently (daughter  works) Type of Home: Apartment Home Access: Level entry     Home Layout: One level     Bathroom Shower/Tub: IT trainerTub/shower unit;Curtain   Bathroom Toilet: Standard Bathroom Accessibility: Yes How Accessible: Accessible via wheelchair;Accessible via walker Home Equipment: Walker - 2 wheels;Shower seat;Grab bars - tub/shower;Grab bars - toilet   Additional Comments: has equipment that spouse had been using prior to his passing in Nov      Prior Functioning/Environment Level of Independence: Independent        Comments: Pt indep with ADL, IADL including driving, med mgt, cooking, and being active in her church; no falls reported in past 12 months        OT Problem List: Decreased strength;Decreased coordination;Decreased cognition;Impaired sensation;Decreased safety awareness;Impaired balance (sitting and/or standing);Impaired UE functional use      OT Treatment/Interventions: Self-care/ADL training;Therapeutic exercise;Therapeutic activities;Energy conservation;Neuromuscular education;DME and/or AE instruction;Patient/family education    OT Goals(Current goals can be found in the care plan section) Acute Rehab OT Goals Patient Stated Goal: go home OT Goal Formulation: With patient Time For Goal Achievement: 10/16/16 Potential to Achieve Goals: Good  OT Frequency: Min 1X/week   Barriers to D/C: Decreased caregiver support          Co-evaluation              AM-PAC PT "6 Clicks" Daily Activity     Outcome Measure Help from another person eating meals?: None Help from another person taking care of personal grooming?: None Help from another person toileting, which includes using toliet, bedpan, or urinal?: A Little Help from another person bathing (including washing, rinsing, drying)?: A Little Help from another person to put on and taking off regular  upper body clothing?: A Little Help from another person to put on and taking off regular lower body clothing?: A  Little 6 Click Score: 20   End of Session Equipment Utilized During Treatment: Gait belt  Activity Tolerance: Patient tolerated treatment well Patient left: in bed;with call bell/phone within reach;with bed alarm set;with family/visitor present  OT Visit Diagnosis: Other abnormalities of gait and mobility (R26.89);Hemiplegia and hemiparesis;Other symptoms and signs involving cognitive function Hemiplegia - Right/Left: Left Hemiplegia - dominant/non-dominant: Non-Dominant Hemiplegia - caused by: Cerebral infarction                Time: 1610-9604 OT Time Calculation (min): 34 min Charges:  OT General Charges $OT Visit: 1 Procedure OT Evaluation $OT Eval Low Complexity: 1 Procedure OT Treatments $Self Care/Home Management : 23-37 mins G-Codes:     Richrd Prime, MPH, MS, OTR/L ascom [phone] 10/02/16, 1:54 PM

## 2016-10-02 NOTE — Evaluation (Signed)
Physical Therapy Evaluation Patient Details Name: Crystal MasseLinda Miller MRN: [id number] DOB: 01/19/1946 Today's Date: 10/02/2016   History of Present Illness  71 y.o. female with No past medical history he states 3 days ago on Friday evening she started getting dizzy spells and couldn't walk. She's been having some balance issues since then. She has a slight headache. Pt is now with R CVA in pons. Still complaints of dizziness this date. History includes HTN.  Clinical Impression  Pt is a pleasant 71 year old female who was admitted for CVA. Pt performs bed mobility with mod I, transfers with cga, and ambulation with cga and RW. Pt demonstrates deficits with unsteadiness/strength/mobility. Pt with L side strength deficits, needing to use RW for all mobility at this time. Pt is high fall risk. Educated family about use of gait belt and RW. No dizziness noted with ambulation at this time. Coordination deficits on R UE/LE with RAMP movement, sensation WNL. Would benefit from skilled PT to address above deficits and promote optimal return to PLOF. Recommend transition to HHPT upon discharge from acute hospitalization.'      Follow Up Recommendations Home health PT;Supervision/Assistance - 24 hour    Equipment Recommendations  Rolling walker with 5" wheels;3in1 (PT)    Recommendations for Other Services       Precautions / Restrictions Precautions Precautions: Fall Restrictions Weight Bearing Restrictions: No      Mobility  Bed Mobility Overal bed mobility: Modified Independent Bed Mobility: Supine to Sit     Supine to sit: Modified independent (Device/Increase time) Sit to supine: Supervision   General bed mobility comments: safe technique performed with ability to transfer to EOB without dizziness present. Upon return to supine, able to slide body up using bed rails  Transfers Overall transfer level: Needs assistance Equipment used: Rolling walker (2 wheeled) Transfers: Sit to/from  Stand Sit to Stand: Min guard         General transfer comment: unsteadiness present. Use of RW. No dizziness noted  Ambulation/Gait Ambulation/Gait assistance: Min guard Ambulation Distance (Feet): 20 Feet Assistive device: Rolling walker (2 wheeled) Gait Pattern/deviations: Step-through pattern;Decreased step length - left   Gait velocity interpretation: Below normal speed for age/gender General Gait Details: unsteadiness noted with ambulation, however no formal LOB noted. Pt cued for correct technique using RW as it tends to get too far away from her at times. When turning, pt makes quick movements, education given. Further mobility noted below.  Stairs            Wheelchair Mobility    Modified Rankin (Stroke Patients Only)       Balance Overall balance assessment: Needs assistance Sitting-balance support: Feet supported Sitting balance-Leahy Scale: Good     Standing balance support: Bilateral upper extremity supported Standing balance-Leahy Scale: Fair Standing balance comment: slight unsteadiness noted, better with 1 handheld assist                             Pertinent Vitals/Pain Pain Assessment: No/denies pain    Home Living Family/patient expects to be discharged to:: Private residence Living Arrangements: Children Available Help at Discharge: Family;Available 24 hours/day (will be able to arrange 24/7 care) Type of Home: Apartment Home Access: Level entry     Home Layout: One level Home Equipment: Shower seat;Grab bars - tub/shower;Grab bars - toilet      Prior Function Level of Independence: Independent         Comments: Pt  indep with ADL, IADL including driving, med mgt, cooking, and being active in her church; no falls reported in past 12 months     Hand Dominance   Dominant Hand: Right    Extremity/Trunk Assessment   Upper Extremity Assessment Upper Extremity Assessment: LUE deficits/detail (R grossly WNL) LUE  Deficits / Details: grip strength WFL. shoulder/elbow flexion grossly 4/5; decreased coordination with RAMP movement LUE Coordination: decreased gross motor    Lower Extremity Assessment Lower Extremity Assessment: LLE deficits/detail;Generalized weakness (R LE WNL) LLE Deficits / Details: L hip/knee flexors grossly 3+/5 and fatigue with endurance. Dorsiflexion grossly WNL. No numbness noted.  LLE Coordination: decreased gross motor (with RAMP movement)       Communication   Communication:  (speech slightly slurred)  Cognition Arousal/Alertness: Awake/alert Behavior During Therapy: WFL for tasks assessed/performed Overall Cognitive Status: Impaired/Different from baseline Area of Impairment: Following commands                       Following Commands: Follows one step commands consistently;Follows multi-step commands with increased time              General Comments      Exercises Other Exercises Other Exercises: Further ambulation performed in hallway x 60'. Fatigue noted in L leg with slight giveaway weakness. Pt cued for keeping RW close to body and upright posture.   Assessment/Plan    PT Assessment Patient needs continued PT services  PT Problem List Decreased strength;Decreased balance;Decreased mobility;Decreased safety awareness;Decreased knowledge of use of DME       PT Treatment Interventions DME instruction;Gait training;Functional mobility training;Balance training    PT Goals (Current goals can be found in the Care Plan section)  Acute Rehab PT Goals Patient Stated Goal: go home PT Goal Formulation: With patient Time For Goal Achievement: 10/16/16 Potential to Achieve Goals: Good    Frequency 7X/week   Barriers to discharge        Co-evaluation               AM-PAC PT "6 Clicks" Daily Activity  Outcome Measure Difficulty turning over in bed (including adjusting bedclothes, sheets and blankets)?: None Difficulty moving from lying  on back to sitting on the side of the bed? : None Difficulty sitting down on and standing up from a chair with arms (e.g., wheelchair, bedside commode, etc,.)?: A Little Help needed moving to and from a bed to chair (including a wheelchair)?: A Little Help needed walking in hospital room?: A Little Help needed climbing 3-5 steps with a railing? : A Little 6 Click Score: 20    End of Session Equipment Utilized During Treatment: Gait belt Activity Tolerance: Patient limited by fatigue Patient left: in bed;with bed alarm set;with family/visitor present Nurse Communication: Mobility status PT Visit Diagnosis: Unsteadiness on feet (R26.81);Muscle weakness (generalized) (M62.81);Dizziness and giddiness (R42)    Time: 1511-1540 PT Time Calculation (min) (ACUTE ONLY): 29 min   Charges:   PT Evaluation $PT Eval Low Complexity: 1 Procedure PT Treatments $Gait Training: 8-22 mins   PT G Codes:        Elizabeth Palau, PT, DPT [phone]   Crystal Miller 10/02/2016, 5:12 PM

## 2016-10-02 NOTE — Discharge Instructions (Signed)
Follow-up with PMD regularly, advised about dietary control of salt, oily and sugary foods.

## 2016-10-02 NOTE — Progress Notes (Signed)
*  PRELIMINARY RESULTS* Echocardiogram 2D Echocardiogram has been performed.  Cristela BlueHege, Caylee Vlachos 10/02/2016, 1:57 PM

## 2016-10-04 ENCOUNTER — Emergency Department
Admission: EM | Admit: 2016-10-04 | Discharge: 2016-10-05 | Disposition: A | Discharge status: Home / Self Care | Payer: Medicare Other | Attending: Emergency Medicine | Admitting: Emergency Medicine

## 2016-10-04 ENCOUNTER — Encounter: Payer: Self-pay | Admitting: Medical Oncology

## 2016-10-04 ENCOUNTER — Emergency Department: Payer: Medicare Other

## 2016-10-04 DIAGNOSIS — E119 Type 2 diabetes mellitus without complications: Secondary | ICD-10-CM | POA: Insufficient documentation

## 2016-10-04 DIAGNOSIS — R42 Dizziness and giddiness: Secondary | ICD-10-CM | POA: Diagnosis present

## 2016-10-04 DIAGNOSIS — Z87891 Personal history of nicotine dependence: Secondary | ICD-10-CM | POA: Diagnosis not present

## 2016-10-04 DIAGNOSIS — I639 Cerebral infarction, unspecified: Secondary | ICD-10-CM

## 2016-10-04 DIAGNOSIS — Z7982 Long term (current) use of aspirin: Secondary | ICD-10-CM | POA: Insufficient documentation

## 2016-10-04 DIAGNOSIS — I10 Essential (primary) hypertension: Secondary | ICD-10-CM | POA: Insufficient documentation

## 2016-10-04 DIAGNOSIS — Z79899 Other long term (current) drug therapy: Secondary | ICD-10-CM | POA: Diagnosis not present

## 2016-10-04 HISTORY — DX: Cerebral infarction, unspecified: I63.9

## 2016-10-04 LAB — URINALYSIS, COMPLETE (UACMP) WITH MICROSCOPIC
BILIRUBIN URINE: NEGATIVE
Bacteria, UA: NONE SEEN
Glucose, UA: NEGATIVE mg/dL
HGB URINE DIPSTICK: NEGATIVE
KETONES UR: NEGATIVE mg/dL
NITRITE: NEGATIVE
PROTEIN: 100 mg/dL — AB
RBC / HPF: NONE SEEN RBC/hpf (ref 0–5)
Specific Gravity, Urine: 1.017 (ref 1.005–1.030)
pH: 5 (ref 5.0–8.0)

## 2016-10-04 LAB — BASIC METABOLIC PANEL
Anion gap: 6 (ref 5–15)
BUN: 38 mg/dL — AB (ref 6–20)
CHLORIDE: 104 mmol/L (ref 101–111)
CO2: 25 mmol/L (ref 22–32)
CREATININE: 1.56 mg/dL — AB (ref 0.44–1.00)
Calcium: 9.2 mg/dL (ref 8.9–10.3)
GFR calc Af Amer: 37 mL/min — ABNORMAL LOW (ref >60)
GFR calc non Af Amer: 32 mL/min — ABNORMAL LOW (ref >60)
GLUCOSE: 225 mg/dL — AB (ref 65–99)
Potassium: 4.4 mmol/L (ref 3.5–5.1)
Sodium: 135 mmol/L (ref 135–145)

## 2016-10-04 LAB — CBC
HCT: 40.1 % (ref 35.0–47.0)
HEMOGLOBIN: 14 g/dL (ref 12.0–16.0)
MCH: 30.8 pg (ref 26.0–34.0)
MCHC: 34.8 g/dL (ref 32.0–36.0)
MCV: 88.3 fL (ref 80.0–100.0)
Platelets: 352 10*3/uL (ref 150–440)
RBC: 4.54 MIL/uL (ref 3.80–5.20)
RDW: 12.4 % (ref 11.5–14.5)
WBC: 11.5 10*3/uL — ABNORMAL HIGH (ref 3.6–11.0)

## 2016-10-04 LAB — URINE CULTURE

## 2016-10-04 LAB — TROPONIN I: Troponin I: <0.03 ng/mL (ref <0.03)

## 2016-10-04 MED ORDER — ATORVASTATIN CALCIUM 20 MG PO TABS
40.0000 mg | ORAL_TABLET | Freq: Every day | ORAL | Status: DC
  Administered 2016-10-04: 40 mg via ORAL
  Filled 2016-10-04 (×2): qty 2

## 2016-10-04 MED ORDER — ATORVASTATIN CALCIUM 20 MG PO TABS: 40.0000 mg | ORAL_TABLET | Freq: Every day | ORAL | Status: DC

## 2016-10-04 MED ORDER — CEFUROXIME AXETIL 250 MG PO TABS
250.0000 mg | ORAL_TABLET | Freq: Two times a day (BID) | ORAL | Status: DC
  Administered 2016-10-04 – 2016-10-05 (×2): 250 mg via ORAL
  Filled 2016-10-04 (×3): qty 1

## 2016-10-04 MED ORDER — SODIUM CHLORIDE 0.9 % IV SOLN
1000.0000 mL | Freq: Once | INTRAVENOUS | Status: AC
  Administered 2016-10-04: 1000 mL via INTRAVENOUS

## 2016-10-04 MED ORDER — ASPIRIN EC 81 MG PO TBEC
81.0000 mg | DELAYED_RELEASE_TABLET | Freq: Every day | ORAL | Status: DC
  Administered 2016-10-05: 81 mg via ORAL
  Filled 2016-10-04: qty 1

## 2016-10-04 MED ORDER — GLIPIZIDE ER 5 MG PO TB24
5.0000 mg | ORAL_TABLET | Freq: Every day | ORAL | Status: DC
  Administered 2016-10-05: 5 mg via ORAL
  Filled 2016-10-04: qty 1

## 2016-10-04 MED ORDER — LISINOPRIL 10 MG PO TABS
20.0000 mg | ORAL_TABLET | Freq: Every day | ORAL | Status: DC
  Administered 2016-10-05: 20 mg via ORAL
  Filled 2016-10-04 (×2): qty 2

## 2016-10-04 NOTE — Progress Notes (Signed)
[facility]  Patient Status: Home health PT [facility], 10/04/16, due to recent CVA. After being in the home for an hour, famiy called 911 and [facility] so she was not actually admitted to the agency. [name] has not yet been seen by her new PCP, Dr. Everlene OtherJayce Cook. PT had today when his office was called and PT was advised to send her to ED.  The daughter and sister in the home reported a significant decline in [name] status starting Weds., 10/03/16. They reported increased weakness and requiring much more assistance with her mobility, balance increasingly worse. Stated she went from walking with minimum assist using her rolling walker to not being able to walk. [name] reported she was having blurred/double vision, increased slurred speech, increased dizziness and weakness, no reported pain.  The family reported her cognitive status appeared stable but she was not interacting with family as she was the day before.  PT noted significant L side weakness, decreased sitting balance and poor standing balance. She required moderate assist with transfers and only able to ambulate 5 feet with +1 maximum assist using RW demonstrating poor balance leaning to L.  Family reported [name] had taken her morning medications and eaten prior to the PT visit. Blood pressure at rest sitting and standing in RUE: 142/78; pulse 76; temperature 97.8; 02 sats at rest 96 percent. Daughter stated she was [facility] to have [name] seen in ED if her condition worsened once she returned home and daughter felt strongly that is what she needed to do.  PT recommending  SN services be added to referral if MD approves due to the following:  1) Diagnosed with recent CVA; was on no medications prior to her recent CVA and now she is on all new meds including for blood pressure, cholesterol, blood sugar, and antibiotic for UTI;  2)Feel  she would benefit from blood sugar monitoring with glucometer and would need education on diet/medication/monitoring blood sugar; 3) She was recently diagnosed with UTI ; 4)She previously did not have a PCP and does not see her newly assigned PCP until 11/06/16.    If patient discharges after hours, please call [phone].   Dimple CaseyJason E Hinton 10/04/2016, 4:18 PM

## 2016-10-04 NOTE — ED Provider Notes (Signed)
[facility] Emergency Department Provider Note   ____________________________________________    I have reviewed the triage vital signs and the nursing notes.   HISTORY  Chief Complaint Dizziness and Blurred Vision     HPI Crystal Miller is a 71 y.o. female who presents with complaints of dizziness and blurred vision. Patient recently discharged from our hospital after diagnosis of a CVA which left her with some mild left-sided weakness. She reports she had been doing well at home and had been ambulating and taking care of herself until yesterday when she developed new blurred vision and some dizziness. Since then she has not been ambulating and family is having difficulty caring for her even with her home health resources. No fevers or chills reported. No cough. No abdominal pain or nausea or vomiting. No headache.    Past Medical History:  Diagnosis Date  . Diabetes mellitus without complication (HCC)   . Hypertension   . Stroke [facility])     Patient Active Problem List   Diagnosis Date Noted  . CVA (cerebral vascular accident) (HCC) 10/01/2016    Past Surgical History:  Procedure Laterality Date  . CHOLECYSTECTOMY      Prior to Admission medications   Medication Sig Start Date End Date Taking? Authorizing Provider  aspirin EC 81 MG EC tablet Take 1 tablet (81 mg total) by mouth daily. 10/03/16  Yes Altamese Dilling, MD  atorvastatin (LIPITOR) 40 MG tablet Take 1 tablet (40 mg total) by mouth daily at 6 PM. 10/02/16  Yes Altamese Dilling, MD  cefUROXime (CEFTIN) 250 MG tablet Take 1 tablet (250 mg total) by mouth 2 (two) times daily with a meal. 10/02/16 10/06/16 Yes Altamese Dilling, MD  glipiZIDE (GLUCOTROL XL) 5 MG 24 hr tablet Take 1 tablet (5 mg total) by mouth daily with breakfast. 10/03/16  Yes Altamese Dilling, MD  lisinopril (PRINIVIL,ZESTRIL) 20 MG tablet Take 1 tablet (20 mg total) by mouth daily. 10/02/16  Yes  Altamese Dilling, MD     Allergies Patient has no known allergies.  Family History  Problem Relation Age of Onset  . Alzheimer's disease Mother   . CVA Father   . CAD Father     Social History Social History  Substance Use Topics  . Smoking status: Former Games developer  . Smokeless tobacco: Never Used  . Alcohol use No    Review of Systems  Constitutional: No fever/chills Eyes: Blurred vision as above.  ENT: No sore throat. Cardiovascular: Denies chest pain. Respiratory: Denies shortness of breath. Gastrointestinal: No abdominal pain.  No nausea, no vomiting.   Genitourinary: Negative for dysuria. Musculoskeletal: Negative for back pain. Skin: Negative for rash. Neurological: Negative for headaches   ____________________________________________   PHYSICAL EXAM:  VITAL SIGNS: ED Triage Vitals  Enc Vitals Group     BP 10/04/16 1230 (!) 154/62     Pulse Rate 10/04/16 1230 72     Resp 10/04/16 1230 (!) 22     Temp --      Temp src --      SpO2 10/04/16 1230 97 %     Weight 10/04/16 1210 146 lb (66.2 kg)     Height 10/04/16 1210 5\' 3"  (1.6 m)     Head Circumference --      Peak Flow --      Pain Score --      Pain Loc --      Pain Edu? --      Excl. in GC? --  Constitutional: Alert and oriented. No acute distress. Pleasant and interactive Eyes: Conjunctivae are normal. PERRLA, EOMI Head: Atraumatic. Nose: No congestion/rhinnorhea. Mouth/Throat: Mucous membranes are moist.    Cardiovascular: Normal rate, regular rhythm. Grossly normal heart sounds.  Good peripheral circulation. Respiratory: Normal respiratory effort.  No retractions. Lungs CTAB. Gastrointestinal: Soft and nontender. No distention.  No CVA tenderness. Genitourinary: deferred Musculoskeletal: No lower extremity tenderness nor edema.  Warm and well perfused Neurologic:  Normal speech and language. No gross focal neurologic deficits are appreciated. Cranial nerves II through XII are  normal  Skin:  Skin is warm, dry and intact. No rash noted. Psychiatric: Mood and affect are normal. Speech and behavior are normal.  ____________________________________________   LABS (all labs ordered are listed, but only abnormal results are displayed)  Labs Reviewed  BASIC METABOLIC PANEL - Abnormal; Notable for the following:       Result Value   Glucose, Bld 225 (*)    BUN 38 (*)    Creatinine, Ser 1.56 (*)    GFR calc non Af Amer 32 (*)    GFR calc Af Amer 37 (*)    All other components within normal limits  CBC - Abnormal; Notable for the following:    WBC 11.5 (*)    All other components within normal limits  TROPONIN I  URINALYSIS, COMPLETE (UACMP) WITH MICROSCOPIC  CBG MONITORING, ED   ____________________________________________  EKG  None ____________________________________________  RADIOLOGY  CT unremarkable ____________________________________________   PROCEDURES  Procedure(s) performed: No    Critical Care performed: No ____________________________________________   INITIAL IMPRESSION / ASSESSMENT AND PLAN / ED COURSE  Pertinent labs & imaging results that were available during my care of the patient were reviewed by me and considered in my medical decision making (see chart for details).  Patient presents with weakness and dizziness, I suspect mild dehydration. IV fluids given with some improvement. I opted to admit the patient to the hospitalist service given her complaint of blurry vision for further evaluation.  Dr. Madelon LipsVacchani has decided to obtain MRI and PT consult to determine whether patient may require inpatient rehab services. Dr. Lenard LancePaduchowski will follow up on these results.   ____________________________________________   FINAL CLINICAL IMPRESSION(S) / ED DIAGNOSES  Final diagnoses:  CVA (cerebral infarction)  Cerebral infarction (HCC)      NEW MEDICATIONS STARTED DURING THIS VISIT:  New Prescriptions   No medications  on file     Note:  This document was prepared using Dragon voice recognition software and may include unintentional dictation errors.    Jene EveryKinner, Destini Cambre, MD 10/04/16 [phone]

## 2016-10-04 NOTE — ED Notes (Signed)
Patient changed to a floor bed from an ED stretcher.

## 2016-10-04 NOTE — Evaluation (Signed)
Physical Therapy Evaluation Patient Details Name: Crystal Miller MRN: [id number] DOB: [birth year] Today's Date: 10/04/2016   History of Present Illness  Pt with recent admission for CVA, now comes back to ED for headache and complaints of vision issues.  Clinical Impression  Pt is a pleasant 71 year old female who was admitted for CVA. Pt performs bed mobility with mod assist, transfers with cga, and ambulation with min assist and RW. Only able to ambulate 5' at this time secondary to unsteadiness with AD. During vision testing, R eye does not cross midline and peripheral vision is reduced on R side. Decreased coordination noted on L side. All L side weakness grossly same to earlier this week. Pt demonstrates deficits with strength/mobility/ambulation/balance. Compared to previous admission, pt with increased deficits regarding balance. Pt is very high falls risk. Pt is not yet at baseline level. Would benefit from skilled PT to address above deficits and promote optimal return to PLOF; recommend transition to STR upon discharge from acute hospitalization.       Follow Up Recommendations SNF    Equipment Recommendations       Recommendations for Other Services       Precautions / Restrictions Precautions Precautions: Fall Restrictions Weight Bearing Restrictions: No      Mobility  Bed Mobility Overal bed mobility: Needs Assistance Bed Mobility: Supine to Sit     Supine to sit: Mod assist Sit to supine: Mod assist   General bed mobility comments: assist for scooting out towards EOB as she demonstrates L side weakness. Once seated at EOB, pt able to sit with upright posture. Complains of slight dizziness with position change  Transfers Overall transfer level: Needs assistance Equipment used: Rolling walker (2 wheeled) Transfers: Sit to/from Stand Sit to Stand: Min guard         General transfer comment: slight unsteadiness, however safe use with  RW  Ambulation/Gait Ambulation/Gait assistance: Min assist Ambulation Distance (Feet): 5 Feet Assistive device: Rolling walker (2 wheeled) Gait Pattern/deviations: Step-to pattern;Ataxic     General Gait Details: Very unsteady during ambulation with ataxic movements noted. Pt has hard time with L foot placement on floor. INcreased difficulty with retro ambulation noted. Needs heavy cues and assist for ambulation  Stairs            Wheelchair Mobility    Modified Rankin (Stroke Patients Only)       Balance Overall balance assessment: Needs assistance Sitting-balance support: Feet supported Sitting balance-Leahy Scale: Good     Standing balance support: Bilateral upper extremity supported Standing balance-Leahy Scale: Poor                               Pertinent Vitals/Pain Pain Assessment: No/denies pain    Home Living Family/patient expects to be discharged to:: Private residence Living Arrangements: Children Available Help at Discharge: Family;Available 24 hours/day Type of Home: Apartment Home Access: Level entry     Home Layout: One level Home Equipment: Shower seat;Grab bars - tub/shower;Grab bars - toilet Additional Comments: has equipment that spouse had been using prior to his passing in Nov    Prior Function Level of Independence: Independent         Comments: Pt indep with ADL, IADL including driving, med mgt, cooking, and being active in her church; no falls reported in past 12 months     Hand Dominance        Extremity/Trunk Assessment   Upper  Extremity Assessment Upper Extremity Assessment: Generalized weakness (L UE grossly 4/5; R UE grossly 4+/5)    Lower Extremity Assessment Lower Extremity Assessment: LLE deficits/detail LLE Deficits / Details: L hip/knee flexors grossly 3+/5 and fatigue with endurance. Dorsiflexion grossly WNL. No numbness noted.        Communication   Communication:  (speech slightly slurred)   Cognition Arousal/Alertness: Awake/alert Behavior During Therapy: WFL for tasks assessed/performed Overall Cognitive Status: Within Functional Limits for tasks assessed                                 General Comments: able to name all family memebers in room      General Comments      Exercises     Assessment/Plan    PT Assessment Patient needs continued PT services  PT Problem List Decreased strength;Decreased balance;Decreased mobility;Decreased safety awareness;Decreased knowledge of use of DME       PT Treatment Interventions DME instruction;Gait training;Functional mobility training;Balance training    PT Goals (Current goals can be found in the Care Plan section)  Acute Rehab PT Goals Patient Stated Goal: to get stronger PT Goal Formulation: With patient Time For Goal Achievement: 10/18/16 Potential to Achieve Goals: Good    Frequency 7X/week   Barriers to discharge        Co-evaluation               AM-PAC PT "6 Clicks" Daily Activity  Outcome Measure Difficulty turning over in bed (including adjusting bedclothes, sheets and blankets)?: Total Difficulty moving from lying on back to sitting on the side of the bed? : Total Difficulty sitting down on and standing up from a chair with arms (e.g., wheelchair, bedside commode, etc,.)?: Total Help needed moving to and from a bed to chair (including a wheelchair)?: A Lot Help needed walking in hospital room?: A Lot Help needed climbing 3-5 steps with a railing? : A Lot 6 Click Score: 9    End of Session Equipment Utilized During Treatment: Gait belt Activity Tolerance: Patient tolerated treatment well Patient left: in bed;with family/visitor present Nurse Communication: Mobility status PT Visit Diagnosis: Unsteadiness on feet (R26.81);Muscle weakness (generalized) (M62.81);Dizziness and giddiness (R42)    Time: 1542-1600 PT Time Calculation (min) (ACUTE ONLY): 18 min   Charges:   PT  Evaluation $PT Eval Low Complexity: 1 Procedure     PT G Codes:   PT G-Codes **NOT FOR INPATIENT CLASS** Functional Assessment Tool Used: AM-PAC 6 Clicks Basic Mobility Functional Limitation: Mobility: Walking and moving around Mobility: Walking and Moving Around Current Status ([id number](G8978): At least 60 percent but less than 80 percent impaired, limited or restricted Mobility: Walking and Moving Around Goal Status [phone](G8979): At least 40 percent but less than 60 percent impaired, limited or restricted    Elizabeth PalauStephanie Brentton Wardlow, PT, DPT [phone]   Larena Ohnemus 10/04/2016, 5:02 PM

## 2016-10-04 NOTE — ED Triage Notes (Signed)
Pt from home via ems with reports of dizziness and blurred vision that began yesterday morning. Pt was treated and discharged on Tuesday for CVA. Pt had left sided deficits and speech difficulties when she went home. Denies pain at this time.

## 2016-10-04 NOTE — ED Provider Notes (Signed)
-----------------------------------------   5:46 PM on 10/04/2016 -----------------------------------------  The patient's MRI shows an evolving acute stroke but no additional/and is stroke. Patient's physical therapy consultation recommends skilled nursing facility placement. I discussed the patient with the clinical social worker, they state they will not have time to place the patient today but will work on her first thing tomorrow morning. I discussed this with the patient and family they are agreeable to stay in the emergency department overnight while awaiting attempted placement tomorrow.   Minna AntisPaduchowski, Niang Mitcheltree, MD 10/04/16 1747

## 2016-10-04 NOTE — ED Notes (Signed)
Patient transported to MRI 

## 2016-10-04 NOTE — ED Notes (Signed)
To ct by stretcher 

## 2016-10-04 NOTE — ED Notes (Signed)
Patient given turkey sandwich tray at this time.   

## 2016-10-04 NOTE — Progress Notes (Signed)
I discharged pt 2 days ago with a stroke and UTI.  As per family- she was fine yesterday morning, in evening yesterday, she had headache and vision problem. Since than it is hard for her to stand up, and they can not take care at home.  I spoke to ER case manager, physicial therapy.  On Exam Vitals and orthostatic  Are stable.  Pt is alert and oriented.  She have new eye movement abnormalities  her right eye does not go past midline on looking at left. And have some nystagmus on left.   Power 5/5 on right side and 4/5 on left side.  Assessment and plan  Recent stroke, dizziness.   Spoke to PT, Child psychotherapistocial worker and ER physician.    Will get an MRI brain, and if a new stroke- admit.   If no new stroke, then it depend on PT eval and CSW may help for placement.    Time spent 35 min.

## 2016-10-04 NOTE — ED Notes (Signed)
Physical Therapist in room to evaluate patient at this time.  Will continue to monitor.

## 2016-10-05 LAB — GLUCOSE, CAPILLARY: Glucose-Capillary: 165 mg/dL — ABNORMAL HIGH (ref 65–99)

## 2016-10-05 MED ORDER — BLOOD GLUCOSE MONITOR KIT: PACK | 0 refills | Status: AC

## 2016-10-05 MED ORDER — LIVING WELL WITH DIABETES BOOK
Freq: Once | Status: AC
  Administered 2016-10-05: 12:00:00
  Filled 2016-10-05: qty 1

## 2016-10-05 NOTE — ED Notes (Addendum)
Daughter's number (Chrissy) [phone]  Son's number Thereasa Distance(Rodney) [phone]

## 2016-10-05 NOTE — ED Provider Notes (Signed)
Patient seen and evaluated by social work who discussed rehabilitation placement versus home PT care with the family. The family cannot afford inpatient rehabilitation placement at this time. They decided to stay with home physical therapy. The patient and family are aware of this plan at this time.  Physical Exam  BP (!) 172/42   Pulse 61   Resp 18   Ht $RemoveBefor eDEID_EWTINpkvHPQpKLptAcLVUKdIripgJXki$5\' 3"5.86 kg/m   Physical Exam No distress. Patient is not reporting any worsening neurologic symptoms. ED Course  Procedures  MDM Patient to be discharged to home with home PT.       Myrna BlazerSchaevitz, David Matthew, MD 10/05/16 1055

## 2016-10-05 NOTE — Progress Notes (Signed)
LCSW received call from Diabetes coordinator and she will follow up with patients daughter to ensure the out patient appointment with Dr Adriana Simasook is not missed.  Delta Air LinesClaudine Maxum Cassarino LCSW [phone]

## 2016-10-05 NOTE — Care Management Note (Signed)
Case Management Note  Patient Details  Name: Consuello MasseLinda Dicola MRN: [id number] Date of Birth: 08/19/1945  Subjective/Objective:         Spoke to patient at bedside who asked myself and Claudine CSW to contact her daughter Prentice DockerChrissy at [phone]. The daughter has  Agreed to come get the patient and [facility]. The CSW talked to her about the cost of STR being out of pocket because the patient did not have a 3 night Medicare qualifying IP stay. They cannot afford to pay out of pocket. She will be here directly to get the patient.      Action/Plan:   Expected Discharge Date:                  Expected Discharge Plan:     In-House Referral:     Discharge planning Services     Post Acute Care Choice:    Choice offered to:     DME Arranged:    DME Agency:     HH Arranged:    HH Agency:     Status of Service:     If discussed at [employer] Length of Stay Meetings, dates discussed:    Additional Comments:  Berna BueCheryl Shylah Dossantos, RN 10/05/2016, 8:10 AM

## 2016-10-05 NOTE — ED Notes (Signed)
Patient assisted to commode. Patient soiled herself. This RN and KSwindleRN cleaned patient, cleaned bed, and replaced linens. Breakfast provided.

## 2016-10-05 NOTE — NC FL2 (Cosign Needed)
[place] MEDICAID FL2 LEVEL OF CARE SCREENING TOOL     IDENTIFICATION  Patient Name: Marchetta Navratil Birthdate: 1945-10-03 Sex: female Admission Date (Current Location): 10/04/2016  [place] Number:  Chiropodist and Address:  [facility], [address], [place], [place] [address]      Provider Number: [id number]  Attending Physician Name and Address:  No att. providers found  Relative Name and Phone Number:  Nicoletta Dress Sister [phone] or Watson,Thrischelle Daughter [phone]     Current Level of Care: Hospital Recommended Level of Care: Skilled Nursing Facility Prior Approval Number:    Date Approved/Denied:   PASRR Number: [id number]  Discharge Plan: SNF    Current Diagnoses: Patient Active Problem List   Diagnosis Date Noted  . CVA (cerebral vascular accident) (HCC) 10/01/2016    Orientation RESPIRATION BLADDER Height & Weight     Self, Time, Situation, Place  Normal Continent Weight: 146 lb (66.2 kg) Height:  5\' 3"  (160 cm)  BEHAVIORAL SYMPTOMS/MOOD NEUROLOGICAL BOWEL NUTRITION STATUS      Continent Diet (Low sodium Heart healthy)  AMBULATORY STATUS COMMUNICATION OF NEEDS Skin   Limited Assist Verbally Normal                       Personal Care Assistance Level of Assistance  Bathing, Feeding, Dressing Bathing Assistance: Limited assistance Feeding assistance: Independent Dressing Assistance: Limited assistance     Functional Limitations Info  Sight, Hearing, Speech Sight Info: Adequate Hearing Info: Adequate Speech Info: Adequate    SPECIAL CARE FACTORS FREQUENCY  PT (By licensed PT)     PT Frequency: 5x a week              Contractures Contractures Info: Not present    Additional Factors Info  Code Status, Allergies Code Status Info: DNR Allergies Info: NKA           Current Medications (10/05/2016):  This is the current hospital active medication  list Current Facility-Administered Medications  Medication Dose Route Frequency Provider Last Rate Last Dose  . aspirin EC tablet 81 mg  81 mg Oral Daily Minna Antis, MD      . atorvastatin (LIPITOR) tablet 40 mg  40 mg Oral q1800 Minna Antis, MD   40 mg at 10/04/16 1932  . cefUROXime (CEFTIN) tablet 250 mg  250 mg Oral BID WC Minna Antis, MD   250 mg at 10/04/16 1932  . glipiZIDE (GLUCOTROL XL) 24 hr tablet 5 mg  5 mg Oral Q breakfast Minna Antis, MD      . lisinopril (PRINIVIL,ZESTRIL) tablet 20 mg  20 mg Oral Daily Minna Antis, MD       Current Outpatient Prescriptions  Medication Sig Dispense Refill  . aspirin EC 81 MG EC tablet Take 1 tablet (81 mg total) by mouth daily. 30 tablet 2  . atorvastatin (LIPITOR) 40 MG tablet Take 1 tablet (40 mg total) by mouth daily at 6 PM. 30 tablet 2  . cefUROXime (CEFTIN) 250 MG tablet Take 1 tablet (250 mg total) by mouth 2 (two) times daily with a meal. 8 tablet 0  . glipiZIDE (GLUCOTROL XL) 5 MG 24 hr tablet Take 1 tablet (5 mg total) by mouth daily with breakfast. 30 tablet 2  . lisinopril (PRINIVIL,ZESTRIL) 20 MG tablet Take 1 tablet (20 mg total) by mouth daily. 30 tablet 2     Discharge Medications: Please see discharge summary for a list of discharge  medications.  Relevant Imaging Results:  Relevant Lab Results:   Additional Information SSN [id number]  Darleene Cleavernterhaus, Jaceon Heiberger R, [place]CSWA

## 2016-10-05 NOTE — ED Notes (Signed)
Blood sugar 165.

## 2016-10-05 NOTE — Progress Notes (Addendum)
LCSW consulted with care manager and spoke directly to patient and explained she did not meet 3 day qualify stay . Even though the PT felt a short term rehab would be good for her, the family has elected to have in home support. Daughter will come at noon to pick up patient once a home sitter can be arranged. ED RN/EDP and care manager consulted   No further needs  Richrd Kuzniar SmyrnaBandi LCSW [phone]

## 2016-10-05 NOTE — ED Notes (Addendum)
Pt's daughter, Prentice DockerChrissy contacted in regards to providing transportation home, states they should be here within the next hour.  Pt updated.

## 2016-10-05 NOTE — ED Notes (Signed)
Pt offered bedpan per request.

## 2016-10-05 NOTE — Clinical Social Work Note (Signed)
CSW received consult that patient needs SNF placement.  CSW was waiting on MRI result to determine if patient had a new stroke.  PT is recommending SNF, patient has not had a qualifying hospital stay.  Pending result of MRI, CSW will meet with patient and her family to explain options of going to SNF and paying privately or return home with home health.  CSW to complete assessment at a later time, FL2 has been completed awaiting signature by physician.  5:40pm  CSW received phone call that patient's MRI does not show an additional stroke.  CSW informed physician, that CSW is not able to see patient tonight and will work on placement for patient and discuss if patient can pay privately for SNF tomorrow.  Ervin KnackEric R. Haley Fuerstenberg, MSW, Theresia MajorsLCSWA [phone]  10/05/2016 5:40pm

## 2016-10-05 NOTE — Progress Notes (Addendum)
Inpatient Diabetes Program Recommendations  AACE/ADA: New Consensus Statement on Inpatient Glycemic Control (2015)  Target Ranges:  Prepandial:   less than 140 mg/dL      Peak postprandial:   less than 180 mg/dL (1-2 hours)      Critically ill patients:  140 - 180 mg/dL   Lab Results  Component Value Date   GLUCAP 165 (H) 10/05/2016   HGBA1C 9.7 (H) 10/01/2016    Spoke to Clinical social worker regarding patient's diagnosis- daughter is aware of the diagnosis so I called to speak to her.  I told her about the Living well with Diabetes book I have ordered for her mother.  I have suggested she purchase a [employer] Reli-on meter and testing strips and check her mother's blood sugar fasting and 2 hours after any meal and to take the blood sugar report with her to the MD visit next month.  I have told her that blood sugars have been around 200mg /dl over the past few months and that she may see blood sugars that are this high.  I have encouraged her to cal the MD office if she has concerns.  Patient will benefit from outpatient diabetes education.   Discussed with Case Management, Gabriel Cirriheryl Wicker RN  Susette RacerJulie Ercell Perlman, RN, [place], [place]HA, CDE Diabetes Coordinator Inpatient Diabetes Program  [phone] (Team Pager) [phone] [facility] Office) 10/05/2016 11:10 AM

## 2016-11-01 ENCOUNTER — Ambulatory Visit (INDEPENDENT_AMBULATORY_CARE_PROVIDER_SITE_OTHER): Payer: Medicare Other | Admitting: Family Medicine

## 2016-11-01 ENCOUNTER — Encounter: Payer: Self-pay | Admitting: Family Medicine

## 2016-11-01 VITALS — BP 160/78 | HR 77 | Temp 98.6°F | Wt 140.5 lb

## 2016-11-01 DIAGNOSIS — I639 Cerebral infarction, unspecified: Secondary | ICD-10-CM

## 2016-11-01 DIAGNOSIS — Z1231 Encounter for screening mammogram for malignant neoplasm of breast: Secondary | ICD-10-CM

## 2016-11-01 DIAGNOSIS — I10 Essential (primary) hypertension: Secondary | ICD-10-CM

## 2016-11-01 DIAGNOSIS — R413 Other amnesia: Secondary | ICD-10-CM | POA: Diagnosis not present

## 2016-11-01 DIAGNOSIS — Z1211 Encounter for screening for malignant neoplasm of colon: Secondary | ICD-10-CM | POA: Diagnosis not present

## 2016-11-01 DIAGNOSIS — E2839 Other primary ovarian failure: Secondary | ICD-10-CM

## 2016-11-01 DIAGNOSIS — E782 Mixed hyperlipidemia: Secondary | ICD-10-CM | POA: Diagnosis not present

## 2016-11-01 DIAGNOSIS — E118 Type 2 diabetes mellitus with unspecified complications: Secondary | ICD-10-CM | POA: Insufficient documentation

## 2016-11-01 DIAGNOSIS — Z8673 Personal history of transient ischemic attack (TIA), and cerebral infarction without residual deficits: Secondary | ICD-10-CM

## 2016-11-01 DIAGNOSIS — Z1239 Encounter for other screening for malignant neoplasm of breast: Secondary | ICD-10-CM

## 2016-11-01 HISTORY — DX: Essential (primary) hypertension: I10

## 2016-11-01 HISTORY — DX: Mixed hyperlipidemia: E78.2

## 2016-11-01 HISTORY — DX: Type 2 diabetes mellitus with unspecified complications: E11.8

## 2016-11-01 LAB — COMPREHENSIVE METABOLIC PANEL
ALT: 22 U/L (ref 0–35)
AST: 21 U/L (ref 0–37)
Albumin: 4.1 g/dL (ref 3.5–5.2)
Alkaline Phosphatase: 78 U/L (ref 39–117)
BILIRUBIN TOTAL: 0.4 mg/dL (ref 0.2–1.2)
BUN: 27 mg/dL — ABNORMAL HIGH (ref 6–23)
CHLORIDE: 103 meq/L (ref 96–112)
CO2: 25 meq/L (ref 19–32)
Calcium: 10 mg/dL (ref 8.4–10.5)
Creatinine, Ser: 1.13 mg/dL (ref 0.40–1.20)
GFR: 50.27 mL/min — AB (ref >60.00)
GLUCOSE: 193 mg/dL — AB (ref 70–99)
Potassium: 4.6 mEq/L (ref 3.5–5.1)
Sodium: 136 mEq/L (ref 135–145)
Total Protein: 7.9 g/dL (ref 6.0–8.3)

## 2016-11-01 LAB — LIPID PANEL
CHOL/HDL RATIO: 3
Cholesterol: 69 mg/dL (ref 0–200)
HDL: 27.3 mg/dL — AB (ref >39.00)
LDL CALC: 19 mg/dL (ref 0–99)
NONHDL: 41.92
Triglycerides: 117 mg/dL (ref 0.0–149.0)
VLDL: 23.4 mg/dL (ref 0.0–40.0)

## 2016-11-01 MED ORDER — AMLODIPINE BESYLATE 5 MG PO TABS: 5.0000 mg | ORAL_TABLET | Freq: Every day | ORAL | 3 refills | Status: DC

## 2016-11-01 NOTE — Assessment & Plan Note (Signed)
Continue aspirin, statin. Needs aggressive blood pressure control as well as control of her diabetes.

## 2016-11-01 NOTE — Assessment & Plan Note (Signed)
New problem. Per daughter.  Wants to see neurology.

## 2016-11-01 NOTE — Assessment & Plan Note (Signed)
Uncontrolled. Metabolic panel today. Adding Norvasc.

## 2016-11-01 NOTE — Assessment & Plan Note (Signed)
Uncontrolled but improving. Metabolic panel today to assess renal function. Recommended metformin but she wants to wait at this time. Continue glipizide.

## 2016-11-01 NOTE — Patient Instructions (Signed)
We will call with the lab results and with the referrals.  Follow up in 1 month.  Take care  Dr. Adriana Simasook

## 2016-11-01 NOTE — Assessment & Plan Note (Signed)
Lipid panel today to assess response to Lipitor.

## 2016-11-01 NOTE — Progress Notes (Signed)
Subjective:  Patient ID: Crystal Miller, female    DOB: 12/25/1945  Age: 71 y.o. MRN: [id number]  CC: Establish care  HPI Crystal Miller is a 71 y.o. female presents to the clinic today to establish care. Issues are below.  Recent CVA  Patient recently admitted for stroke.  She was discharged home on aspirin, statin, lisinopril.  It was recommended that she have home health PT and OT.  She seems to be doing okay at home. She does require around-the-clock care due to difficulty ambulating and weakness.  Daughter endorses that she is compliant with her medications.  Daughter reports that she's noticed some changes in her cognition even prior to the stroke. She would like her to see a neurologist.  Additionally, patient and daughter state that she is struggling with her mood.   Hypertension  Uncontrolled.  She is currently on lisinopril 20 mg daily.  Her home blood pressure readings are elevated. The majority are greater than 150 systolic.  DM-2  Most recent A1c was 9.7. Her sugars are improving per her home readings. Sugars ranging from 108-225 this week.  Compliant with glipizide. Is not on metformin.  Hyperlipidemia  She is currently on Lipitor. She needs repeat lipid panel to ensure adequate response.  Preventative care  Patient has not seen a doctor in years.  She is in need of several preventative healthcare items: Foot exam, eye exam, tetanus, mammogram, colonoscopy, bone density scan.  She is amendable to an eye exam, foot exam, mammogram, cologuard, and bone density. Will wait on tetanus.  PMH, Surgical Hx, Family Hx, Social History reviewed and updated as below.  Past Medical History:  Diagnosis Date  . DM (diabetes mellitus), 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  . Essential hypertension 11/01/2016  . Mixed hyperlipidemia 11/01/2016  . Stroke [facility](HCC)    Past Surgical History:  Procedure Laterality Date  . CHOLECYSTECTOMY     Family History    Problem Relation Age of Onset  . Alzheimer's disease Mother   . Arthritis Mother   . Hypertension Mother   . CVA Father   . CAD Father   . Heart disease Father   . Stroke Father   . Hypertension Father    Social History  Substance Use Topics  . Smoking status: Former Smoker    Years: 20.00    Types: Cigarettes  . Smokeless tobacco: Never Used  . Alcohol use No   Review of Systems  Constitutional: Positive for unexpected weight change.  HENT: Positive for hearing loss and voice change.   Eyes: Positive for visual disturbance.  Gastrointestinal: Positive for constipation.  Genitourinary:       Urinary incontinence.  Musculoskeletal: Positive for arthralgias.  Neurological: Positive for dizziness, weakness, numbness and headaches.  All other systems reviewed and are negative.   Objective:   Today's Vitals: BP (!) 160/78 (BP Location: Right Arm, Patient Position: Sitting, Cuff Size: Large)   Pulse 77   Temp 98.6 F (37 C) (Oral)   Wt 140 lb 8 oz (63.7 kg)   SpO2 98%   BMI 24.89 kg/m   Physical Exam  Constitutional: She appears well-developed. No distress.  HENT:  Head: Normocephalic and atraumatic.  Mouth/Throat: Oropharynx is clear and moist.  Eyes: Conjunctivae are normal. No scleral icterus.  Neck: Neck supple. No thyromegaly present.  Cardiovascular: Normal rate and regular rhythm.   Pulmonary/Chest: Effort normal and breath sounds normal. She has no wheezes. She has no rales.  Abdominal: Soft. She exhibits  no distension. There is no tenderness. There is no rebound and no guarding.  Lymphadenopathy:    She has no cervical adenopathy.  Neurological: She is alert.  Left sided weakness noted. Difficulty with ambulation.  Skin: Skin is warm. No rash noted.  Psychiatric: She has a normal mood and affect.  Vitals reviewed.  Assessment & Plan:   Problem List Items Addressed This Visit      Cardiovascular and Mediastinum   Essential hypertension     Uncontrolled. Metabolic panel today. Adding Norvasc.      Relevant Medications   amLODipine (NORVASC) 5 MG tablet   Other Relevant Orders   Comprehensive metabolic panel     Endocrine   DM (diabetes mellitus), type 2 with complications (HCC)    Uncontrolled but improving. Metabolic panel today to assess renal function. Recommended metformin but she wants to wait at this time. Continue glipizide.      Relevant Orders   Ambulatory referral to Ophthalmology     Other   Mixed hyperlipidemia    Lipid panel today to assess response to Lipitor.      Relevant Medications   amLODipine (NORVASC) 5 MG tablet   Other Relevant Orders   Lipid panel   Memory change    New problem. Per daughter.  Wants to see neurology.      Relevant Orders   Ambulatory referral to Neurology   History of stroke - Primary    Continue aspirin, statin. Needs aggressive blood pressure control as well as control of her diabetes.      Relevant Orders   Ambulatory referral to Neurology    Other Visit Diagnoses    Colon cancer screening       Relevant Orders   Cologuard   Breast cancer screening       Relevant Orders   MM Digital Screening   Estrogen deficiency       Relevant Orders   DG BONE DENSITY (DXA)      Meds ordered this encounter  Medications  . amLODipine (NORVASC) 5 MG tablet    Sig: Take 1 tablet (5 mg total) by mouth daily.    Dispense:  90 tablet    Refill:  3   Follow-up: 1 month  Telsa Dillavou DO [facility]

## 2016-11-06 ENCOUNTER — Encounter: Payer: Self-pay | Admitting: Family Medicine

## 2016-11-06 ENCOUNTER — Ambulatory Visit: Payer: Medicare Other | Admitting: Family Medicine

## 2016-11-07 ENCOUNTER — Ambulatory Visit
Admission: RE | Admit: 2016-11-07 | Discharge: 2016-11-07 | Disposition: A | Discharge status: Home / Self Care | Payer: Medicare Other | Source: Ambulatory Visit | Attending: Family Medicine | Admitting: Family Medicine

## 2016-11-07 ENCOUNTER — Ambulatory Visit: Payer: Medicare Other

## 2016-11-07 ENCOUNTER — Other Ambulatory Visit: Payer: Self-pay | Admitting: Family Medicine

## 2016-11-07 ENCOUNTER — Telehealth: Payer: Self-pay | Admitting: *Deleted

## 2016-11-07 DIAGNOSIS — Z1231 Encounter for screening mammogram for malignant neoplasm of breast: Secondary | ICD-10-CM | POA: Insufficient documentation

## 2016-11-07 DIAGNOSIS — E2839 Other primary ovarian failure: Secondary | ICD-10-CM | POA: Insufficient documentation

## 2016-11-07 DIAGNOSIS — M810 Age-related osteoporosis without current pathological fracture: Secondary | ICD-10-CM | POA: Insufficient documentation

## 2016-11-07 MED ORDER — ALENDRONATE SODIUM 70 MG PO TABS: 70.0000 mg | ORAL_TABLET | ORAL | 11 refills | Status: DC

## 2016-11-07 NOTE — Telephone Encounter (Signed)
Harriett SineNancy from [facility] requested verbal orders for a home health aide to assist with bathing.  FYI Pt missed her occupational therapy appt today, due to scheduling conflict.  Intel CorporationContact Nancy [phone]

## 2016-11-07 NOTE — Telephone Encounter (Signed)
Spoke with Harriett SineNancy and gave verbal thanks

## 2016-11-07 NOTE — Progress Notes (Signed)
.  fp

## 2016-11-08 ENCOUNTER — Telehealth: Payer: Self-pay | Admitting: *Deleted

## 2016-11-08 ENCOUNTER — Other Ambulatory Visit: Payer: Self-pay | Admitting: Family Medicine

## 2016-11-08 DIAGNOSIS — N6489 Other specified disorders of breast: Secondary | ICD-10-CM

## 2016-11-08 DIAGNOSIS — R928 Other abnormal and inconclusive findings on diagnostic imaging of breast: Secondary | ICD-10-CM

## 2016-11-08 NOTE — Telephone Encounter (Signed)
[facility] has requested verbal extended ordered for physical therapy  frequency of 2 weeks 3 times a week and 1 week 1 time a week starting next week  Contact [phone]

## 2016-11-08 NOTE — Telephone Encounter (Signed)
Gave verbal, thanks

## 2016-11-14 ENCOUNTER — Ambulatory Visit
Admission: RE | Admit: 2016-11-14 | Discharge: 2016-11-14 | Disposition: A | Discharge status: Home / Self Care | Payer: Medicare Other | Source: Ambulatory Visit | Attending: Family Medicine | Admitting: Family Medicine

## 2016-11-14 DIAGNOSIS — N6489 Other specified disorders of breast: Secondary | ICD-10-CM | POA: Diagnosis not present

## 2016-11-14 DIAGNOSIS — R928 Other abnormal and inconclusive findings on diagnostic imaging of breast: Secondary | ICD-10-CM

## 2016-11-14 LAB — COLOGUARD: Cologuard: NEGATIVE

## 2016-11-14 NOTE — Telephone Encounter (Signed)
Give verbal please.

## 2016-11-14 NOTE — Telephone Encounter (Signed)
Bennetta LaosNancy Wilson called wanted to add extension for OT for 1 x week for one week and two times week for the next two weeks.   Call back [phone]

## 2016-11-15 NOTE — Telephone Encounter (Signed)
Left voice mail to call back 

## 2016-11-16 NOTE — Telephone Encounter (Signed)
Gave orders as listed below

## 2016-11-26 ENCOUNTER — Encounter: Payer: Self-pay | Admitting: Family Medicine

## 2016-11-30 ENCOUNTER — Telehealth: Payer: Self-pay | Admitting: Family Medicine

## 2016-11-30 NOTE — Telephone Encounter (Signed)
Harriett SineNancy from [facility] called and left a voicemail stating that she will be away next week and that pt refuses to have another Occupational therapist while she is away, so pt will have 2 missed visit and will be discharged from OT. Pt is in agreement with this. Please advise, thank you!  Call [place] HeightsNancy @ [phone]

## 2016-11-30 NOTE — Telephone Encounter (Signed)
Left voice mail to call back 

## 2016-11-30 NOTE — Telephone Encounter (Signed)
Ok

## 2016-12-04 ENCOUNTER — Encounter: Payer: Self-pay | Admitting: Family Medicine

## 2016-12-04 ENCOUNTER — Ambulatory Visit (INDEPENDENT_AMBULATORY_CARE_PROVIDER_SITE_OTHER): Payer: Medicare Other | Admitting: Family Medicine

## 2016-12-04 DIAGNOSIS — I639 Cerebral infarction, unspecified: Secondary | ICD-10-CM

## 2016-12-04 DIAGNOSIS — I10 Essential (primary) hypertension: Secondary | ICD-10-CM

## 2016-12-04 DIAGNOSIS — E782 Mixed hyperlipidemia: Secondary | ICD-10-CM

## 2016-12-04 DIAGNOSIS — Z8673 Personal history of transient ischemic attack (TIA), and cerebral infarction without residual deficits: Secondary | ICD-10-CM | POA: Diagnosis not present

## 2016-12-04 DIAGNOSIS — E118 Type 2 diabetes mellitus with unspecified complications: Secondary | ICD-10-CM | POA: Diagnosis not present

## 2016-12-04 MED ORDER — METFORMIN HCL 500 MG PO TABS: 500.0000 mg | ORAL_TABLET | Freq: Two times a day (BID) | ORAL | 3 refills | Status: DC

## 2016-12-04 NOTE — Patient Instructions (Signed)
Metformin as prescribed.  Continue your other medications.   Follow up in 3 months.  We will call with the PT appt  Take care  Dr. Adriana Simasook

## 2016-12-04 NOTE — Assessment & Plan Note (Signed)
Doing well. Referring to PT.

## 2016-12-04 NOTE — Progress Notes (Signed)
Subjective:  Patient ID: Crystal Miller, female    DOB: [birth year]  Age: 71 y.o. MRN: [id number]  CC: Follow up  HPI:  71 year old female with history of stroke, DM 2, hypertension, hyperlipidemia presents for follow-up.  History of stroke  Patient is doing well.  She still having some gait instability.  She has finished home health PT and OT. Home health recommending outpatient PT.  Endorses compliance with aspirin, statin.  DM-2  Blood sugars not at goal.  She is currently on glipizide.  We'll discuss additional pharmacotherapy today.  HTN  Stable.  Currently on amlodipine and lisinopril. Blood pressures improving.  HLD  At goal on Lipitor.  Social Hx   Social History   Social History  . Marital status: Widowed    Spouse name: N/A  . Number of children: N/A  . Years of education: N/A   Social History Main Topics  . Smoking status: Former Smoker    Years: 20.00    Types: Cigarettes  . Smokeless tobacco: Never Used  . Alcohol use No  . Drug use: No  . Sexual activity: No   Other Topics Concern  . None   Social History Narrative  . None    Review of Systems  Constitutional: Negative.   Musculoskeletal: Positive for gait problem.  Psychiatric/Behavioral:       Feels depressed/down at times.   Objective:  BP (!) 144/80   Pulse 82   Temp 98 F (36.7 C) (Oral)   Wt 140 lb 9.6 oz (63.8 kg)   SpO2 98%   BMI 24.91 kg/m   BP/Weight 12/04/2016 11/01/2016 10/05/2016  Systolic BP 144 160 170  Diastolic BP 80 78 61  Wt. (Lbs) 140.6 140.5 -  BMI 24.91 24.89 -    Physical Exam  Constitutional: She is oriented to person, place, and time. She appears well-developed.  Cardiovascular: Normal rate and regular rhythm.   No murmur heard. Pulmonary/Chest: Effort normal and breath sounds normal. She has no wheezes. She has no rales.  Neurological: She is alert and oriented to person, place, and time.  Psychiatric: She has a normal mood and affect.    Vitals reviewed.  Lab Results  Component Value Date   WBC 11.5 (H) 10/04/2016   HGB 14.0 10/04/2016   HCT 40.1 10/04/2016   PLT 352 10/04/2016   GLUCOSE 193 (H) 11/01/2016   CHOL 69 11/01/2016   TRIG 117.0 11/01/2016   HDL 27.30 (L) 11/01/2016   LDLCALC 19 11/01/2016   ALT 22 11/01/2016   AST 21 11/01/2016   NA 136 11/01/2016   K 4.6 11/01/2016   CL 103 11/01/2016   CREATININE 1.13 11/01/2016   BUN 27 (H) 11/01/2016   CO2 25 11/01/2016   HGBA1C 9.7 (H) 10/01/2016    Assessment & Plan:   Problem List Items Addressed This Visit      Cardiovascular and Mediastinum   Essential hypertension    Stable on Norvasc and Lisinopril.        Endocrine   DM (diabetes mellitus), type 2 with complications (HCC)    Adding Metformin. Continue glipizide.      Relevant Medications   metFORMIN (GLUCOPHAGE) 500 MG tablet     Other   History of stroke    Doing well. Referring to PT.      Relevant Orders   Ambulatory referral to Physical Therapy   Mixed hyperlipidemia    At goal on Lipitor.  Meds ordered this encounter  Medications  . metFORMIN (GLUCOPHAGE) 500 MG tablet    Sig: Take 1 tablet (500 mg total) by mouth 2 (two) times daily with a meal.    Dispense:  180 tablet    Refill:  3   Follow-up: 3 months  Nisha Dhami Adriana Simasook DO [facility] [place]

## 2016-12-04 NOTE — Assessment & Plan Note (Signed)
Adding Metformin. Continue glipizide.

## 2016-12-04 NOTE — Assessment & Plan Note (Signed)
At goal on Lipitor. 

## 2016-12-04 NOTE — Telephone Encounter (Signed)
Harriett SineNancy , [facility] advised of below.

## 2016-12-04 NOTE — Assessment & Plan Note (Signed)
Stable on Norvasc and Lisinopril.

## 2017-01-01 ENCOUNTER — Ambulatory Visit: Payer: Medicare Other | Attending: Family Medicine

## 2017-01-01 DIAGNOSIS — R262 Difficulty in walking, not elsewhere classified: Secondary | ICD-10-CM | POA: Diagnosis present

## 2017-01-01 DIAGNOSIS — M6281 Muscle weakness (generalized): Secondary | ICD-10-CM

## 2017-01-01 NOTE — Therapy (Signed)
[facility] [address] [place], [place], [address] Phone: [phone]   Fax:  [phone]  Physical Therapy Evaluation  Patient Details  Name: Crystal MasseLinda Miller MRN: [id number] Date of Birth: 09/03/1945 Referring Provider: Everlene OtherJayce Cook DO  Encounter Date: 01/01/2017      PT End of Session - 01/01/17 1044    Visit Number 1   Number of Visits 16   Date for PT Re-Evaluation 02/26/17   Authorization Type 1 / 10 G Code   PT Start Time 0945   PT Stop Time 1045   PT Time Calculation (min) 60 min   Activity Tolerance Patient tolerated treatment well   Behavior During Therapy [facility] for tasks assessed/performed      Past Medical History:  Diagnosis Date  . DM (diabetes mellitus), 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  . Essential hypertension 11/01/2016  . Mixed hyperlipidemia 11/01/2016  . Stroke [facility](HCC)     Past Surgical History:  Procedure Laterality Date  . CHOLECYSTECTOMY      There were no vitals filed for this visit.       Subjective Assessment - 01/01/17 1002    Subjective Patient reports increased balance difficulty and generalized/ L sided weakness after experiencing a CVA on 10/04/16. Patient states she has difficulty with performing prolonged standing, ascending/descending, reaching on a high shelf, walking (requires use of rollator), cooking, and dressing herself (requires to sit to don/doff clothing and reports it takes increased time to perform. Patient states she has intermittent pain in her feel from diabetes.   Pertinent History PMH: Diabetes type II, HTN, Osteoporosis, CVA on 10/04/16   Limitations Lifting;Standing;Walking   How long can you stand comfortably? 1 min   How long can you walk comfortably? 30min    Diagnostic tests MRI: Positive   Patient Stated Goals Not to be dependent with walker   Currently in Pain? No/denies            [facility] Assessment - 01/01/17 0956      Assessment    Medical Diagnosis History of Stroke    Referring Provider Everlene OtherJayce Cook DO   Onset Date/Surgical Date 10/04/16   Hand Dominance Right   Next MD Visit unknown   Prior Therapy home health     Balance Screen   Has the patient fallen in the past 6 months No   Has the patient had a decrease in activity level because of a fear of falling?  Yes   Is the patient reluctant to leave their home because of a fear of falling?  No     Home Nurse, mental healthnvironment   Living Environment Private residence   Living Arrangements Children   Available Help at Discharge Friend(s)   Type of Home Apartment   Home Access Level entry   Home Layout One level   Home Equipment Shower seat;Toilet riser;Grab bars - toilet  rollator     Prior Function   Level of Independence Independent   Vocation Retired   GafferVocation Requirements N/A   Leisure To go church     Cognition   Overall Cognitive Status Within Functional Limits for tasks assessed     Observation/Other Assessments   Other Surveys  Other Surveys   Lower Extremity Functional Scale  30/80     Sensation   Light Touch Appears Intact     Functional Tests   Functional tests Sit to Stand     Sit to Stand   Comments Able  to perform ~ 50% of time with push off of thighs     ROM / Strength   AROM / PROM / Strength AROM;Strength     AROM   AROM Assessment Site Hip;Knee   Right/Left Hip --  WNL   Right/Left Knee --  WNL: Decreased R knee extension in sitting     Strength   Strength Assessment Site Hip;Knee;Ankle   Right/Left Hip Right;Left   Right Hip Flexion 4-/5   Right Hip ABduction 3/5   Right Hip ADduction 3/5   Left Hip Flexion 3+/5   Left Hip ABduction 4-/5   Left Hip ADduction 4-/5   Right/Left Knee Right;Left   Right Knee Flexion 4-/5   Right Knee Extension 4-/5   Left Knee Flexion 3+/5   Left Knee Extension 3+/5   Right/Left Ankle Right;Left   Right Ankle Dorsiflexion 4-/5   Right Ankle Plantar Flexion 4-/5   Left Ankle Dorsiflexion 3+/5    Left Ankle Plantar Flexion 2/5     Transfers   Five time sit to stand comments  42sec     Ambulation/Gait   Assistive device Rollator   Gait Pattern Decreased step length - left;Poor foot clearance - left  Decreased push off on the L LE     6 minute walk test results    Aerobic Endurance Distance Walked 550     Standardized Balance Assessment   Standardized Balance Assessment Timed Up and Go Test;10 meter walk test;Five Times Sit to Stand   10 Meter Walk .63m/s      Timed Up and Go Test   Normal TUG (seconds) 32     Balance: Feet together balance -- 20sec Staggered stance -- 10sec B   Therapeutic Exercise: Staggered stance -- x 2 B Sit to stands -- x10   Patient demonstrates increased fatigue at end of session   Objective measurements completed on examination: See above findings.           PT Education - 01/01/17 1044    Education provided Yes   Education Details HEP: sit to stands, staggerd stance for balance   Person(s) Educated Patient   Methods Explanation;Demonstration;Handout   Comprehension Returned demonstration;Verbalized understanding             PT Long Term Goals - 01/01/17 1052      PT LONG TERM GOAL #1   Title Patient will be independent with HEP to continue benefits of therapy after discharge   Baseline Dependent with exercise performance and progression   Time 8   Period Weeks   Status New   Target Date 02/26/17     PT LONG TERM GOAL #2   Title Patient will improve TUG to <20sec to demonstrate significant improvement in fall risk.    Baseline TUG: 35secs   Time 8   Period Weeks   Status New   Target Date 01/29/17     PT LONG TERM GOAL #3   Title Patient will improve to >1066ft improve LE endurance and improve ability to walk throughout the grocery store.    Baseline : 595ft   Time 8   Period Weeks   Status New   Target Date 02/26/17     PT LONG TERM GOAL #4   Title Patient will improve scores  to >1.0 m/s to demonstrate improvement with community ambulation without a RW.   Baseline .23m/s with rollator   Time 8   Period Weeks   Status New  Target Date 02/26/17     PT LONG TERM GOAL #5   Title Patient will improve 5XSTS to under 16sec to demonstrate significant improvement in LE functional strength and decreased fall risk   Baseline 42sec   Time 8   Period Weeks   Status New   Target Date 02/26/17                Plan - 2017-01-25 1045    Clinical Impression Statement Patient is a 71 yo right hand dominant female presenting with increased balance and walking difficulties after experiencing a CVA on 10/04/16. Patient demonstrates increased balance difficulties and fall risk as demonstrated by decreased scores in TUG, , , and 5xSTS. Patient also demonstrates decreased strength as indicated by decreased MMT scores. Patient will benefit from further skilled therapy focused on improving limitations to return to prior level of function.    History and Personal Factors relevant to plan of care: History of diabetes, CVA, osteoporosis   Clinical Presentation Stable   Clinical Presentation due to: symptoms improving   Clinical Decision Making Low   Rehab Potential Good   Clinical Impairments Affecting Rehab Potential (+) Highly motivated, family support (-) age, diabetes   PT Frequency 2x / week   PT Duration 8 weeks   PT Treatment/Interventions Gait training;Moist Heat;Therapeutic activities;Therapeutic exercise;Balance training;Patient/family education;Neuromuscular re-education;Stair training;Functional mobility training;Passive range of motion;Manual techniques;Electrical Stimulation   PT Next Visit Plan Progress strengthening   PT Home Exercise Plan See education section   Consulted and Agree with Plan of Care Patient      Patient will benefit from skilled therapeutic intervention in order to improve the following deficits and impairments:  Abnormal gait,  Decreased endurance, Decreased strength, Decreased balance, Difficulty walking, Decreased mobility  Visit Diagnosis: Muscle weakness (generalized) - Plan: PT plan of care cert/re-cert  Difficulty in walking, not elsewhere classified - Plan: PT plan of care cert/re-cert      G-Codes - 01-25-2017 1100    Functional Assessment Tool Used (Outpatient Only) 5XSTS, , , TUG, clinical judgement   Functional Limitation Mobility: Walking and moving around   Mobility: Walking and Moving Around Current Status (Z6109) At least 40 percent but less than 60 percent impaired, limited or restricted   Mobility: Walking and Moving Around Goal Status [phone]) At least 1 percent but less than 20 percent impaired, limited or restricted       Problem List Patient Active Problem List   Diagnosis Date Noted  . DM (diabetes mellitus), 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  . Essential hypertension 11/01/2016  . Mixed hyperlipidemia 11/01/2016  . Memory change 11/01/2016  . History of stroke 10/01/2016    Myrene Galas, PT DPT January 25, 2017, 11:13 AM  [facility] SPORTS MEDICINE [address] [place], [address] Phone: [phone]   Fax:  [phone]  Name: Crystal Miller MRN: [id number] Date of Birth: 1945/06/17

## 2017-01-03 ENCOUNTER — Ambulatory Visit: Payer: Medicare Other

## 2017-01-03 DIAGNOSIS — M6281 Muscle weakness (generalized): Secondary | ICD-10-CM

## 2017-01-03 DIAGNOSIS — R262 Difficulty in walking, not elsewhere classified: Secondary | ICD-10-CM

## 2017-01-03 NOTE — Therapy (Signed)
[facility]ICINE [address] [place], [place], [address] Phone: [phone]   Fax:  [phone]  Physical Therapy Treatment  Patient Details  Name: Crystal MasseLinda Miller MRN: [id number] Date of Birth: 01/20/1946 Referring Provider: Everlene OtherJayce Cook DO  Encounter Date: 01/03/2017      PT End of Session - 01/03/17 0949    Visit Number 2   Number of Visits 16   Date for PT Re-Evaluation 02/26/17   Authorization Type 2 / 10 G Code   PT Start Time 0945   PT Stop Time 1030   PT Time Calculation (min) 45 min   Activity Tolerance Patient tolerated treatment well   Behavior During Therapy [facility] for tasks assessed/performed      Past Medical History:  Diagnosis Date  . DM (diabetes mellitus), 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  . Essential hypertension 11/01/2016  . Mixed hyperlipidemia 11/01/2016  . Stroke [facility](HCC)     Past Surgical History:  Procedure Laterality Date  . CHOLECYSTECTOMY      There were no vitals filed for this visit.      Subjective Assessment - 01/03/17 0941    Subjective Patient reports increased fatigue after the previous session but no increase in pain. Patient reports no major changes since the previous visit.   Pertinent History PMH: Diabetes type II, HTN, Osteoporosis, CVA on 10/04/16   Limitations Lifting;Standing;Walking   How long can you stand comfortably? 1 min   How long can you walk comfortably? 30min    Diagnostic tests MRI: Positive   Patient Stated Goals Not to be dependent with walker   Currently in Pain? No/denies         Therapeutic Exercise Nustep level 2 - x 6min with cueing on speed of performance  Sit to stands - 2 x 10 with push up of thighs Seated hip adduction/ glute squeeze - x 15 Side stepping across airex beam - x 5 down and back with intermittent UE support Heel raises off of airex beam - x20 Standing marches with single unilateral hand support -- x 20  Step ups onto 6"  step -- 3 x 5  Hip abduction in standing -- x20     Patient demonstrates increased LE fatigue at end of session       PT Education - 01/03/17 0948    Education provided Yes   Education Details HEP: form/technique with exercise   Person(s) Educated Patient   Methods Explanation;Demonstration   Comprehension Verbalized understanding;Returned demonstration             PT Long Term Goals - 01/01/17 1052      PT LONG TERM GOAL #1   Title Patient will be independent with HEP to continue benefits of therapy after discharge   Baseline Dependent with exercise performance and progression   Time 8   Period Weeks   Status New   Target Date 02/26/17     PT LONG TERM GOAL #2   Title Patient will improve TUG to <20sec to demonstrate significant improvement in fall risk.    Baseline TUG: 35secs   Time 8   Period Weeks   Status New   Target Date 01/29/17     PT LONG TERM GOAL #3   Title Patient will improve 6minWT to >10100ft improve LE endurance and improve ability to walk throughout the grocery store.    Baseline 6minWT: 55450ft   Time 8   Period Weeks   Status New  Target Date 02/26/17     PT LONG TERM GOAL #4   Title Patient will improve scores to >1.0 m/s to demonstrate improvement with community ambulation without a RW.   Baseline .9m/s with rollator   Time 8   Period Weeks   Status New   Target Date 02/26/17     PT LONG TERM GOAL #5   Title Patient will improve 5XSTS to under 16sec to demonstrate significant improvement in LE functional strength and decreased fall risk   Baseline 42sec   Time 8   Period Weeks   Status New   Target Date 02/26/17               Plan - 01/03/17 0959    Clinical Impression Statement Progressed patient's strengthening and balance exercises to improve LE strength and balance. Patient demonstrates improvement with ability to perform sit to stands requiring less attempts to perform the motion. Patient demonstrates  decreased strength indicated by increase fatigue after performing exercises. Patient will benefit from further skilled therapy to return to prior level of function.    Rehab Potential Good   Clinical Impairments Affecting Rehab Potential (+) Highly motivated, family support (-) age, diabetes   PT Frequency 2x / week   PT Duration 8 weeks   PT Treatment/Interventions Gait training;Moist Heat;Therapeutic activities;Therapeutic exercise;Balance training;Patient/family education;Neuromuscular re-education;Stair training;Functional mobility training;Passive range of motion;Manual techniques;Electrical Stimulation   PT Next Visit Plan Progress strengthening   PT Home Exercise Plan See education section   Consulted and Agree with Plan of Care Patient      Patient will benefit from skilled therapeutic intervention in order to improve the following deficits and impairments:  Abnormal gait, Decreased endurance, Decreased strength, Decreased balance, Difficulty walking, Decreased mobility  Visit Diagnosis: Muscle weakness (generalized)  Difficulty in walking, not elsewhere classified     Problem List Patient Active Problem List   Diagnosis Date Noted  . DM (diabetes mellitus), 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  . Essential hypertension 11/01/2016  . Mixed hyperlipidemia 11/01/2016  . Memory change 11/01/2016  . History of stroke 10/01/2016    Myrene Galas, PT DPT  01/03/2017, 10:15 AM   [facility] PHYSICAL AND SPORTS MEDICINE [address], [place], [address] Phone: [phone]   Fax:  [phone]  Name: Crystal Miller MRN: [id number] Date of Birth: 1945-09-11

## 2017-01-04 ENCOUNTER — Other Ambulatory Visit: Payer: Self-pay | Admitting: Family Medicine

## 2017-01-04 MED ORDER — LISINOPRIL 20 MG PO TABS: 20.0000 mg | ORAL_TABLET | Freq: Every day | ORAL | 2 refills | Status: DC

## 2017-01-04 MED ORDER — ATORVASTATIN CALCIUM 40 MG PO TABS: 40.0000 mg | ORAL_TABLET | Freq: Every day | ORAL | 2 refills | Status: DC

## 2017-01-04 NOTE — Telephone Encounter (Signed)
Sent to pharmacy 

## 2017-01-04 NOTE — Telephone Encounter (Signed)
Last OV 12/04/16 with Dr.Cook, last filled by Dr.Vachhani 10/02/16

## 2017-01-04 NOTE — Telephone Encounter (Signed)
Pt daughter called and is requesting a refill on pt's lisinopril (PRINIVIL,ZESTRIL) 20 MG tablet, and atorvastatin (LIPITOR) 40 MG tablet. Pt has been out of medication for a few days. Please advise, thank you!  Pharmacy - [facility] [address] [place], [place] - [address]

## 2017-01-08 ENCOUNTER — Ambulatory Visit: Payer: Medicare Other

## 2017-01-08 DIAGNOSIS — M6281 Muscle weakness (generalized): Secondary | ICD-10-CM | POA: Diagnosis not present

## 2017-01-08 DIAGNOSIS — R262 Difficulty in walking, not elsewhere classified: Secondary | ICD-10-CM

## 2017-01-08 NOTE — Therapy (Signed)
[facility] [facility]INE [address] [place], [address] Phone: [phone]   Fax:  [phone]  Physical Therapy Treatment  Patient Details  Name: Crystal Miller MRN: [id number] Date of Birth: [birth year] Referring Provider: Everlene Other DO  Encounter Date: 01/08/2017      PT End of Session - 01/08/17 0953    Visit Number 3   Number of Visits 16   Date for PT Re-Evaluation 02/26/17   Authorization Type 3/ 10 G Code   PT Start Time 0945   PT Stop Time 1030   PT Time Calculation (min) 45 min   Activity Tolerance Patient tolerated treatment well   Behavior During Therapy [facility] for tasks assessed/performed      Past Medical History:  Diagnosis Date  . DM (diabetes mellitus), 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  . Essential hypertension 11/01/2016  . Mixed hyperlipidemia 11/01/2016  . Stroke [facility])     Past Surgical History:  Procedure Laterality Date  . CHOLECYSTECTOMY      There were no vitals filed for this visit.      Subjective Assessment - 01/08/17 0948    Subjective Patient reports she didnt feel terribly tired after the previous session. Patient denies any major changes since the previous visit.    Pertinent History PMH: Diabetes type II, HTN, Osteoporosis, CVA on 10/04/16   Limitations Lifting;Standing;Walking   How long can you stand comfortably? 1 min   How long can you walk comfortably?    Diagnostic tests MRI: Positive   Patient Stated Goals Not to be dependent with walker   Currently in Pain? No/denies          Therapeutic Exercise Nustep level 4 - x with cueing on speed of performance  Sit to stands - 2 x 10 with push up of thighs Seated hip adduction/ glute squeeze - x 15 Step ups onto 6" step -- 2 x 7 B Walking around the gym with SPC - 58ft x 2 with focus on improving heel strike and stride length Heel raises off of ground - 2x20 Side stepping down and back  - x 5 down  and back at side of treadmill with UE support  Patient demonstrates increased LE fatigue at the end of the session          PT Education - 01/08/17 0950    Education provided Yes   Education Details Form/technique with exercise   Person(s) Educated Patient   Methods Explanation;Demonstration   Comprehension Verbalized understanding;Returned demonstration             PT Long Term Goals - 01/01/17 1052      PT LONG TERM GOAL #1   Title Patient will be independent with HEP to continue benefits of therapy after discharge   Baseline Dependent with exercise performance and progression   Time 8   Period Weeks   Status New   Target Date 02/26/17     PT LONG TERM GOAL #2   Title Patient will improve TUG to <20sec to demonstrate significant improvement in fall risk.    Baseline TUG: 35secs   Time 8   Period Weeks   Status New   Target Date 01/29/17     PT LONG TERM GOAL #3   Title Patient will improve to >1076ft improve LE endurance and improve ability to walk throughout the grocery store.    Baseline : 528ft   Time 8   Period  Weeks   Status New   Target Date 02/26/17     PT LONG TERM GOAL #4   Title Patient will improve scores to >1.0 m/s to demonstrate improvement with community ambulation without a RW.   Baseline .81m/s with rollator   Time 8   Period Weeks   Status New   Target Date 02/26/17     PT LONG TERM GOAL #5   Title Patient will improve 5XSTS to under 16sec to demonstrate significant improvement in LE functional strength and decreased fall risk   Baseline 42sec   Time 8   Period Weeks   Status New   Target Date 02/26/17               Plan - 01/08/17 0953    Clinical Impression Statement Conitnued to progress patient's exercises with more resistance and harder techniques. Patient demonstrates improvement with sit to stand ability with less use of UEs compared to previous visits. Focussed on improving ambulation quality  improving heel strike and stride length with use of a SPC. Patient will benefit from further skilled therapy focused on improving limitations to return to prior level of function.     Rehab Potential Good   Clinical Impairments Affecting Rehab Potential (+) Highly motivated, family support (-) age, diabetes   PT Frequency 2x / week   PT Duration 8 weeks   PT Treatment/Interventions Gait training;Moist Heat;Therapeutic activities;Therapeutic exercise;Balance training;Patient/family education;Neuromuscular re-education;Stair training;Functional mobility training;Passive range of motion;Manual techniques;Electrical Stimulation   PT Next Visit Plan Progress strengthening   PT Home Exercise Plan See education section   Consulted and Agree with Plan of Care Patient      Patient will benefit from skilled therapeutic intervention in order to improve the following deficits and impairments:  Abnormal gait, Decreased endurance, Decreased strength, Decreased balance, Difficulty walking, Decreased mobility  Visit Diagnosis: Muscle weakness (generalized)  Difficulty in walking, not elsewhere classified     Problem List Patient Active Problem List   Diagnosis Date Noted  . DM (diabetes mellitus), 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  . Essential hypertension 11/01/2016  . Mixed hyperlipidemia 11/01/2016  . Memory change 11/01/2016  . History of stroke 10/01/2016    Myrene Galas, PT DPT 01/08/2017, 10:18 AM   [facility] PHYSICAL AND SPORTS MEDICINE [address] [place], [address] Phone: [phone]   Fax:  [phone]  Name: Junell Cullifer MRN: [id number] Date of Birth: 1945/06/01

## 2017-01-10 ENCOUNTER — Ambulatory Visit: Payer: Medicare Other

## 2017-01-10 DIAGNOSIS — R262 Difficulty in walking, not elsewhere classified: Secondary | ICD-10-CM

## 2017-01-10 DIAGNOSIS — M6281 Muscle weakness (generalized): Secondary | ICD-10-CM

## 2017-01-10 NOTE — Therapy (Signed)
[place] [facility]INE [address] [place], [address] Phone: [phone]   Fax:  [phone]  Physical Therapy Treatment  Patient Details  Name: Crystal Miller MRN: [id number] Date of Birth: December 16, 1945 Referring Provider: Everlene Other DO  Encounter Date: 01/10/2017      PT End of Session - 01/10/17 0907    Visit Number 4   Number of Visits 16   Date for PT Re-Evaluation 02/26/17   Authorization Type 4/ 10 G Code   PT Start Time 0904   PT Stop Time 0945   PT Time Calculation (min) 41 min   Activity Tolerance Patient tolerated treatment well   Behavior During Therapy [facility] for tasks assessed/performed      Past Medical History:  Diagnosis Date  . DM (diabetes mellitus), 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  . Essential hypertension 11/01/2016  . Mixed hyperlipidemia 11/01/2016  . Stroke [facility])     Past Surgical History:  Procedure Laterality Date  . CHOLECYSTECTOMY      There were no vitals filed for this visit.      Subjective Assessment - 01/10/17 0906    Subjective Patient reports she's felt 'a little tired' after the previous visit. Patient states she's been performing HEP throughout the day.   Pertinent History PMH: Diabetes type II, HTN, Osteoporosis, CVA on 10/04/16   Limitations Lifting;Standing;Walking   How long can you stand comfortably? 1 min   How long can you walk comfortably?    Diagnostic tests MRI: Positive   Patient Stated Goals Not to be dependent with walker   Currently in Pain? No/denies        Therapeutic Exercise Nustep level 4 - x with cueing on speed of performance  Side stepping down and back under airex beam - x 5 down and back at side of treadmill with UE support Hip abduction in standing - x 15 requiring tactile cueing to decrease trunk lean Seated Leg Press at OMEGA - x 10 35#, x 10 45# Sit to stands -with push up of thighs 2 x 12 Walking around the gym with  [facility] - x119ft with focus on improving heel strike and stride length Heel raises off of ground - x20 Standing mini squats with UE support - x 15   Patient demonstrates increased LE fatigue at the end of the session        PT Education - 01/10/17 0907    Education provided Yes   Education Details form/technique with exercise   Person(s) Educated Patient   Methods Explanation;Demonstration   Comprehension Verbalized understanding;Returned demonstration             PT Long Term Goals - 01/01/17 1052      PT LONG TERM GOAL #1   Title Patient will be independent with HEP to continue benefits of therapy after discharge   Baseline Dependent with exercise performance and progression   Time 8   Period Weeks   Status New   Target Date 02/26/17     PT LONG TERM GOAL #2   Title Patient will improve TUG to <20sec to demonstrate significant improvement in fall risk.    Baseline TUG: 35secs   Time 8   Period Weeks   Status New   Target Date 01/29/17     PT LONG TERM GOAL #3   Title Patient will improve to >1088ft improve LE endurance and improve ability to walk throughout the grocery store.  Baseline : 570ft   Time 8   Period Weeks   Status New   Target Date 02/26/17     PT LONG TERM GOAL #4   Title Patient will improve scores to >1.0 m/s to demonstrate improvement with community ambulation without a RW.   Baseline .62m/s with rollator   Time 8   Period Weeks   Status New   Target Date 02/26/17     PT LONG TERM GOAL #5   Title Patient will improve 5XSTS to under 16sec to demonstrate significant improvement in LE functional strength and decreased fall risk   Baseline 42sec   Time 8   Period Weeks   Status New   Target Date 02/26/17               Plan - 01/10/17 0941    Clinical Impression Statement Focused on improving quadriceps strength and walking ability with a SPC. Patient demonstrates improvement with ambulating with a cane with  ability to perform greater step length and speed. Patient continues to demonstrate decreased strength and balance mostly in narrow BOS positios. Patient will benefit from further skilled therapy focused on improving limitations to return to prior level of function.    Rehab Potential Good   Clinical Impairments Affecting Rehab Potential (+) Highly motivated, family support (-) age, diabetes   PT Frequency 2x / week   PT Duration 8 weeks   PT Treatment/Interventions Gait training;Moist Heat;Therapeutic activities;Therapeutic exercise;Balance training;Patient/family education;Neuromuscular re-education;Stair training;Functional mobility training;Passive range of motion;Manual techniques;Electrical Stimulation   PT Next Visit Plan Progress strengthening   PT Home Exercise Plan See education section   Consulted and Agree with Plan of Care Patient      Patient will benefit from skilled therapeutic intervention in order to improve the following deficits and impairments:  Abnormal gait, Decreased endurance, Decreased strength, Decreased balance, Difficulty walking, Decreased mobility  Visit Diagnosis: Muscle weakness (generalized)  Difficulty in walking, not elsewhere classified     Problem List Patient Active Problem List   Diagnosis Date Noted  . DM (diabetes mellitus), 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  . Essential hypertension 11/01/2016  . Mixed hyperlipidemia 11/01/2016  . Memory change 11/01/2016  . History of stroke 10/01/2016    Myrene Galas, PT DPT 01/10/2017, 9:46 AM  [place] [facility] PHYSICAL AND SPORTS MEDICINE [address] [place], [address] Phone: [phone]   Fax:  [phone]  Name: Crystal Miller MRN: [id number] Date of Birth: April 30, 1946

## 2017-01-15 ENCOUNTER — Ambulatory Visit: Payer: Medicare Other

## 2017-01-15 DIAGNOSIS — M6281 Muscle weakness (generalized): Secondary | ICD-10-CM | POA: Diagnosis not present

## 2017-01-15 DIAGNOSIS — R262 Difficulty in walking, not elsewhere classified: Secondary | ICD-10-CM

## 2017-01-15 NOTE — Therapy (Signed)
[place] [facility] [address], [place], [address] Phone: [phone]   Fax:  [phone]  Physical Therapy Treatment  Patient Details  Name: Crystal Miller MRN: [id number] Date of Birth: Oct 05, 1945 Referring Provider: Everlene Other DO  Encounter Date: 01/15/2017      PT End of Session - 01/15/17 0911    Visit Number 5   Number of Visits 16   Date for PT Re-Evaluation 02/26/17   Authorization Type 5/ 10 G Code   PT Start Time 0903   PT Stop Time 0945   PT Time Calculation (min) 42 min   Activity Tolerance Patient tolerated treatment well   Behavior During Therapy [facility] for tasks assessed/performed      Past Medical History:  Diagnosis Date  . DM (diabetes mellitus), 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  . Essential hypertension 11/01/2016  . Mixed hyperlipidemia 11/01/2016  . Stroke [facility])     Past Surgical History:  Procedure Laterality Date  . CHOLECYSTECTOMY      There were no vitals filed for this visit.      Subjective Assessment - 01/15/17 0909    Subjective Patient reports no major changes since the previous session. Patient reports she's been performing her HEP.    Pertinent History PMH: Diabetes type II, HTN, Osteoporosis, CVA on 10/04/16   Limitations Lifting;Standing;Walking   How long can you stand comfortably? 1 min   How long can you walk comfortably?    Diagnostic tests MRI: Positive   Patient Stated Goals Not to be dependent with walker   Currently in Pain? No/denies         Therapeutic Exercise Nustep level 3 - x with cueing on speed of performance  Feet together balance with feet together - 2  x 45sec  Widened tandem pre gait weight shifting in standing - x20  Ambulation with focus on improving heel strike and narrowing BOS -- 78ft x 2  Stepping over cane to improve step length and dynamic balance -- 2 x 12  Heel raises in standing with UE support -- x 20   Ambulation with SPC -- x213ft with focus on improving heel strike     Patient demonstrates increased LE fatigue at the end of the session        PT Education - 01/15/17 0911    Education provided Yes   Education Details form/technique with exercise   Person(s) Educated Patient   Methods Explanation;Demonstration   Comprehension Verbalized understanding;Returned demonstration             PT Long Term Goals - 01/01/17 1052      PT LONG TERM GOAL #1   Title Patient will be independent with HEP to continue benefits of therapy after discharge   Baseline Dependent with exercise performance and progression   Time 8   Period Weeks   Status New   Target Date 02/26/17     PT LONG TERM GOAL #2   Title Patient will improve TUG to <20sec to demonstrate significant improvement in fall risk.    Baseline TUG: 35secs   Time 8   Period Weeks   Status New   Target Date 01/29/17     PT LONG TERM GOAL #3   Title Patient will improve to >1060ft improve LE endurance and improve ability to walk throughout the grocery store.    Baseline : 531ft   Time 8   Period Weeks   Status  New   Target Date 02/26/17     PT LONG TERM GOAL #4   Title Patient will improve scores to >1.0 m/s to demonstrate improvement with community ambulation without a RW.   Baseline .58m/s with rollator   Time 8   Period Weeks   Status New   Target Date 02/26/17     PT LONG TERM GOAL #5   Title Patient will improve 5XSTS to under 16sec to demonstrate significant improvement in LE functional strength and decreased fall risk   Baseline 42sec   Time 8   Period Weeks   Status New   Target Date 02/26/17               Plan - 01/15/17 0912    Clinical Impression Statement Patient demonstrates increased BOS when walking and requires cueing to correct to enable faster and safer walking ability. Patient demosntrates improved gait pattern to previous visit indicating functional carryover  between visits. Patient will benefit from further skilled therapy to decrease fall risk.    Rehab Potential Good   Clinical Impairments Affecting Rehab Potential (+) Highly motivated, family support (-) age, diabetes   PT Frequency 2x / week   PT Duration 8 weeks   PT Treatment/Interventions Gait training;Moist Heat;Therapeutic activities;Therapeutic exercise;Balance training;Patient/family education;Neuromuscular re-education;Stair training;Functional mobility training;Passive range of motion;Manual techniques;Electrical Stimulation   PT Next Visit Plan Progress strengthening   PT Home Exercise Plan See education section   Consulted and Agree with Plan of Care Patient      Patient will benefit from skilled therapeutic intervention in order to improve the following deficits and impairments:  Abnormal gait, Decreased endurance, Decreased strength, Decreased balance, Difficulty walking, Decreased mobility  Visit Diagnosis: Muscle weakness (generalized)  Difficulty in walking, not elsewhere classified     Problem List Patient Active Problem List   Diagnosis Date Noted  . DM (diabetes mellitus), 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  . Essential hypertension 11/01/2016  . Mixed hyperlipidemia 11/01/2016  . Memory change 11/01/2016  . History of stroke 10/01/2016    Myrene Galas, PT DPT 01/15/2017, 9:28 AM  [place] [facility] PHYSICAL AND SPORTS MEDICINE [address] [place], [address] Phone: [phone]   Fax:  [phone]  Name: Crystal Miller MRN: [id number] Date of Birth: 18-Dec-1945

## 2017-01-17 ENCOUNTER — Ambulatory Visit: Payer: Medicare Other

## 2017-01-17 DIAGNOSIS — M6281 Muscle weakness (generalized): Secondary | ICD-10-CM | POA: Diagnosis not present

## 2017-01-17 DIAGNOSIS — R262 Difficulty in walking, not elsewhere classified: Secondary | ICD-10-CM

## 2017-01-17 NOTE — Therapy (Signed)
[place] [facility] [address] [place], [place], [address] Phone: [phone]   Fax:  [phone]  Physical Therapy Treatment  Patient Details  Name: Crystal MasseLinda Payson MRN: [id number] Date of Birth: 04/27/1946 Referring Provider: Everlene OtherJayce Cook DO  Encounter Date: 01/17/2017      PT End of Session - 01/17/17 0908    Visit Number 6   Number of Visits 16   Date for PT Re-Evaluation 02/26/17   Authorization Type 6/ 10 G Code   PT Start Time 0900   PT Stop Time 0945   PT Time Calculation (min) 45 min   Activity Tolerance Patient tolerated treatment well   Behavior [facility] for tasks assessed/performed      Past Medical History:  Diagnosis Date  . DM (diabetes mellitus), 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  . Essential hypertension 11/01/2016  . Mixed hyperlipidemia 11/01/2016  . Stroke [facility](HCC)     Past Surgical History:  Procedure Laterality Date  . CHOLECYSTECTOMY      There were no vitals filed for this visit.      Subjective Assessment - 01/17/17 0907    Subjective Patient reports no major changes since the previous visit. Patient reports she is overall doing well on this date. Patient reports she was able to sweep her kitchen since the previous visit.    Pertinent History PMH: Diabetes type II, HTN, Osteoporosis, CVA on 10/04/16   Limitations Lifting;Standing;Walking   How long can you stand comfortably? 1 min   How long can you walk comfortably? 30min    Diagnostic tests MRI: Positive   Patient Stated Goals Not to be dependent with walker   Currently in Pain? No/denies         Therapeutic Exercise Nustep level 5 - x 10min with cueing on speed of performance  Widened tandem pre gait weight shifting in standing - x20  Feet together balance in standing - 2  x 45sec  Heel raises in standing with UE support -- x 20  Stepping over cane to improve step length and dynamic balance -- 2 x 12  Ambulation  with SPC -- x27600ft with focus on improving heel strike       Patient demonstrates increased LE fatigue at the end of the session       PT Education - 01/17/17 0907    Education provided Yes   Education Details form/technique with exercise   Person(s) Educated Patient   Methods Explanation;Demonstration   Comprehension Verbalized understanding;Returned demonstration             PT Long Term Goals - 01/01/17 1052      PT LONG TERM GOAL #1   Title Patient will be independent with HEP to continue benefits of therapy after discharge   Baseline Dependent with exercise performance and progression   Time 8   Period Weeks   Status New   Target Date 02/26/17     PT LONG TERM GOAL #2   Title Patient will improve TUG to <20sec to demonstrate significant improvement in fall risk.    Baseline TUG: 35secs   Time 8   Period Weeks   Status New   Target Date 01/29/17     PT LONG TERM GOAL #3   Title Patient will improve 6minWT to >104800ft improve LE endurance and improve ability to walk throughout the grocery store.    Baseline 6minWT: 56950ft   Time 8   Period Weeks  Status New   Target Date 02/26/17     PT LONG TERM GOAL #4   Title Patient will improve scores to >1.0 m/s to demonstrate improvement with community ambulation without a RW.   Baseline .68m/s with rollator   Time 8   Period Weeks   Status New   Target Date 02/26/17     PT LONG TERM GOAL #5   Title Patient will improve 5XSTS to under 16sec to demonstrate significant improvement in LE functional strength and decreased fall risk   Baseline 42sec   Time 8   Period Weeks   Status New   Target Date 02/26/17               Plan - 01/17/17 0913    Clinical Impression Statement Continued to focus on improving decreasing base of support, weight shifting when ambulating, and improving step length B. Patient demonstrates difficulty with weight shifting and maintaining balance without UE support. Patient  demonstratres decreased strength and balance and will benefit from further skilled therapy to return to prior level of function.    Rehab Potential Good   Clinical Impairments Affecting Rehab Potential (+) Highly motivated, family support (-) age, diabetes   PT Frequency 2x / week   PT Duration 8 weeks   PT Treatment/Interventions Gait training;Moist Heat;Therapeutic activities;Therapeutic exercise;Balance training;Patient/family education;Neuromuscular re-education;Stair training;Functional mobility training;Passive range of motion;Manual techniques;Electrical Stimulation   PT Next Visit Plan Progress strengthening   PT Home Exercise Plan See education section   Consulted and Agree with Plan of Care Patient      Patient will benefit from skilled therapeutic intervention in order to improve the following deficits and impairments:  Abnormal gait, Decreased endurance, Decreased strength, Decreased balance, Difficulty walking, Decreased mobility  Visit Diagnosis: Muscle weakness (generalized)  Difficulty in walking, not elsewhere classified     Problem List Patient Active Problem List   Diagnosis Date Noted  . DM (diabetes mellitus), 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  . Essential hypertension 11/01/2016  . Mixed hyperlipidemia 11/01/2016  . Memory change 11/01/2016  . History of stroke 10/01/2016    Myrene Galas, PT DPT 01/17/2017, 9:31 AM  [place] [facility] PHYSICAL AND SPORTS MEDICINE [address] [place], [address] Phone: [phone]   Fax:  [phone]  Name: Sanora Cunanan MRN: [id number] Date of Birth: 10/15/1945

## 2017-01-22 ENCOUNTER — Ambulatory Visit: Payer: Medicare Other | Attending: Family Medicine

## 2017-01-22 DIAGNOSIS — M6281 Muscle weakness (generalized): Secondary | ICD-10-CM | POA: Diagnosis present

## 2017-01-22 DIAGNOSIS — R262 Difficulty in walking, not elsewhere classified: Secondary | ICD-10-CM | POA: Diagnosis present

## 2017-01-22 NOTE — Therapy (Signed)
[facility]INE [address] [place], [place], [address] Phone: [phone]   Fax:  [phone]  Physical Therapy Treatment  Patient Details  Name: Crystal MasseLinda Miller MRN: [id number] Date of Birth: 02/03/1946 Referring Provider: Everlene OtherJayce Cook DO  Encounter Date: 01/22/2017      PT End of Session - 01/22/17 1356    Visit Number 7   Number of Visits 16   Date for PT Re-Evaluation 02/26/17   Authorization Type 7/ 10 G Code   PT Start Time 1345   PT Stop Time 1430   PT Time Calculation (min) 45 min   Activity Tolerance Patient tolerated treatment well   Behavior During Therapy [facility] for tasks assessed/performed      Past Medical History:  Diagnosis Date  . DM (diabetes mellitus), 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  . Essential hypertension 11/01/2016  . Mixed hyperlipidemia 11/01/2016  . Stroke [facility](HCC)     Past Surgical History:  Procedure Laterality Date  . CHOLECYSTECTOMY      There were no vitals filed for this visit.      Subjective Assessment - 01/22/17 1352    Subjective Patient reports she did not perform her exercises but has been focusing on improving gait speed and heel strike when ambulating.    Pertinent History PMH: Diabetes type II, HTN, Osteoporosis, CVA on 10/04/16   Limitations Lifting;Standing;Walking   How long can you stand comfortably? 1 min   How long can you walk comfortably? 30min    Diagnostic tests MRI: Positive   Patient Stated Goals Not to be dependent with walker   Currently in Pain? No/denies        Therapeutic Exercise Nustep level 4 - x 10min with cueing on speed of performance  Widened tandem pre gait weight shifting in standing - x20  Hip abduction in standing - x15 Standing squats with UE support - 2 x 20 (required increased cueing for proper positioning) Feet together balance in standing in staggered foot positioning- 2  x 45sec Stepping over quad cane to improve step  length and dynamic balance -- 2 x 12  Ambulation with SPC -- x27450ft with focus on improving heel strike     Patient demonstrates increased LE fatigue at the end of the session         PT Education - 01/22/17 1356    Education provided Yes   Education Details form/technique with exercise   Person(s) Educated Patient   Methods Explanation;Demonstration   Comprehension Verbalized understanding;Returned demonstration             PT Long Term Goals - 01/01/17 1052      PT LONG TERM GOAL #1   Title Patient will be independent with HEP to continue benefits of therapy after discharge   Baseline Dependent with exercise performance and progression   Time 8   Period Weeks   Status New   Target Date 02/26/17     PT LONG TERM GOAL #2   Title Patient will improve TUG to <20sec to demonstrate significant improvement in fall risk.    Baseline TUG: 35secs   Time 8   Period Weeks   Status New   Target Date 01/29/17     PT LONG TERM GOAL #3   Title Patient will improve 6minWT to >10400ft improve LE endurance and improve ability to walk throughout the grocery store.    Baseline 6minWT: 56750ft   Time 8   Period Weeks  Status New   Target Date 02/26/17     PT LONG TERM GOAL #4   Title Patient will improve scores to >1.0 m/s to demonstrate improvement with community ambulation without a RW.   Baseline .67m/s with rollator   Time 8   Period Weeks   Status New   Target Date 02/26/17     PT LONG TERM GOAL #5   Title Patient will improve 5XSTS to under 16sec to demonstrate significant improvement in LE functional strength and decreased fall risk   Baseline 42sec   Time 8   Period Weeks   Status New   Target Date 02/26/17               Plan - 01/22/17 1357    Clinical Impression Statement Continued to focus on improving patient's balance and strength within her LEs. Patinet demonstrates ability to perform greater distance while ambulating indicating functional  improvement and improvement in LE endurance. Although patient is improving, she continues to demonstrate decreased narrow BOS balance and LE strength. Patient will benefit from further skilled therapy to return to prior level of function/    Rehab Potential Good   Clinical Impairments Affecting Rehab Potential (+) Highly motivated, family support (-) age, diabetes   PT Frequency 2x / week   PT Duration 8 weeks   PT Treatment/Interventions Gait training;Moist Heat;Therapeutic activities;Therapeutic exercise;Balance training;Patient/family education;Neuromuscular re-education;Stair training;Functional mobility training;Passive range of motion;Manual techniques;Electrical Stimulation   PT Next Visit Plan Progress strengthening   PT Home Exercise Plan See education section   Consulted and Agree with Plan of Care Patient      Patient will benefit from skilled therapeutic intervention in order to improve the following deficits and impairments:  Abnormal gait, Decreased endurance, Decreased strength, Decreased balance, Difficulty walking, Decreased mobility  Visit Diagnosis: Muscle weakness (generalized)  Difficulty in walking, not elsewhere classified     Problem List Patient Active Problem List   Diagnosis Date Noted  . DM (diabetes mellitus), 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  . Essential hypertension 11/01/2016  . Mixed hyperlipidemia 11/01/2016  . Memory change 11/01/2016  . History of stroke 10/01/2016    Myrene Galas, PT DPT 01/22/2017, 2:29 PM  [facility] PHYSICAL AND SPORTS MEDICINE [address], [place], [address] Phone: [phone]   Fax:  [phone]  Name: Crystal Miller MRN: [id number] Date of Birth: 14-Sep-1945

## 2017-01-24 ENCOUNTER — Ambulatory Visit: Payer: Medicare Other

## 2017-01-24 DIAGNOSIS — M6281 Muscle weakness (generalized): Secondary | ICD-10-CM | POA: Diagnosis not present

## 2017-01-24 DIAGNOSIS — R262 Difficulty in walking, not elsewhere classified: Secondary | ICD-10-CM

## 2017-01-24 NOTE — Therapy (Signed)
[facility] [address] [place], [place], [address] Phone: [phone]   Fax:  [phone]  Physical Therapy Treatment  Patient Details  Name: Crystal MasseLinda Miller MRN: [id number] Date of Birth: 12/03/1945 Referring Provider: Everlene OtherJayce Cook DO  Encounter Date: 01/24/2017      PT End of Session - 01/24/17 1407    Visit Number 8   Number of Visits 16   Date for PT Re-Evaluation 02/26/17   Authorization Type 8/ 10 G Code   PT Start Time 1346   PT Stop Time 1430   PT Time Calculation (min) 44 min   Activity Tolerance Patient tolerated treatment well   Behavior During Therapy [facility] for tasks assessed/performed      Past Medical History:  Diagnosis Date  . DM (diabetes mellitus), 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  . Essential hypertension 11/01/2016  . Mixed hyperlipidemia 11/01/2016  . Stroke [facility](HCC)     Past Surgical History:  Procedure Laterality Date  . CHOLECYSTECTOMY      There were no vitals filed for this visit.      Subjective Assessment - 01/24/17 1353    Subjective Patient reports she continues to feel she is improving and states it's easier to move.    Pertinent History PMH: Diabetes type II, HTN, Osteoporosis, CVA on 10/04/16   Limitations Lifting;Standing;Walking   How long can you stand comfortably? 1 min   How long can you walk comfortably? 30min    Diagnostic tests MRI: Positive   Patient Stated Goals Not to be dependent with walker   Currently in Pain? No/denies       Therapeutic Exercise Nustep level 5 - x 10min with cueing on speed of performance  Stepping over two quad canes to improve step length and dynamic balance - x 13 B Hip abduction in standing - x20 Hip Extension in standing - x 20  Heel raises in standing with UE support - x20 Ambulation with SPC - x49700ft with focus on improving heel strike  Sit to stands without use of UEs - x 15   Patient demonstrates increased LE fatigue  at the end of the session        PT Education - 01/24/17 1406    Education provided Yes   Education Details form/technique with exercise   Person(s) Educated Patient   Methods Explanation;Demonstration   Comprehension Verbalized understanding;Returned demonstration             PT Long Term Goals - 01/01/17 1052      PT LONG TERM GOAL #1   Title Patient will be independent with HEP to continue benefits of therapy after discharge   Baseline Dependent with exercise performance and progression   Time 8   Period Weeks   Status New   Target Date 02/26/17     PT LONG TERM GOAL #2   Title Patient will improve TUG to <20sec to demonstrate significant improvement in fall risk.    Baseline TUG: 35secs   Time 8   Period Weeks   Status New   Target Date 01/29/17     PT LONG TERM GOAL #3   Title Patient will improve 6minWT to >104500ft improve LE endurance and improve ability to walk throughout the grocery store.    Baseline 6minWT: 54250ft   Time 8   Period Weeks   Status New   Target Date 02/26/17     PT LONG TERM GOAL #4   Title Patient  will improve scores to >1.0 m/s to demonstrate improvement with community ambulation without a RW.   Baseline .74m/s with rollator   Time 8   Period Weeks   Status New   Target Date 02/26/17     PT LONG TERM GOAL #5   Title Patient will improve 5XSTS to under 16sec to demonstrate significant improvement in LE functional strength and decreased fall risk   Baseline 42sec   Time 8   Period Weeks   Status New   Target Date 02/26/17               Plan - 01/24/17 1408    Clinical Impression Statement Continued to work on improving LE strength and balance in standing to improve standing tolerance and ability to walk for longer periods of time. Patient demonstrates greater ability to walk for longer periods of time without a break indicating functional carryover between visits. Patient continues to demonstrate decreased LE  strength and will benefit from further skilled therapy to return to prior level of function.    Rehab Potential Good   Clinical Impairments Affecting Rehab Potential (+) Highly motivated, family support (-) age, diabetes   PT Frequency 2x / week   PT Duration 8 weeks   PT Treatment/Interventions Gait training;Moist Heat;Therapeutic activities;Therapeutic exercise;Balance training;Patient/family education;Neuromuscular re-education;Stair training;Functional mobility training;Passive range of motion;Manual techniques;Electrical Stimulation   PT Next Visit Plan Progress strengthening   PT Home Exercise Plan See education section   Consulted and Agree with Plan of Care Patient      Patient will benefit from skilled therapeutic intervention in order to improve the following deficits and impairments:  Abnormal gait, Decreased endurance, Decreased strength, Decreased balance, Difficulty walking, Decreased mobility  Visit Diagnosis: Muscle weakness (generalized)  Difficulty in walking, not elsewhere classified     Problem List Patient Active Problem List   Diagnosis Date Noted  . DM (diabetes mellitus), 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  . Essential hypertension 11/01/2016  . Mixed hyperlipidemia 11/01/2016  . Memory change 11/01/2016  . History of stroke 10/01/2016    Myrene Galas, PT DPT 01/24/2017, 2:29 PM  [facility] PHYSICAL AND SPORTS MEDICINE [address], [place], [address] Phone: [phone]   Fax:  [phone]  Name: Crystal Miller MRN: [id number] Date of Birth: Feb 06, 1946

## 2017-01-29 ENCOUNTER — Ambulatory Visit: Payer: Medicare Other

## 2017-01-29 DIAGNOSIS — R262 Difficulty in walking, not elsewhere classified: Secondary | ICD-10-CM

## 2017-01-29 DIAGNOSIS — M6281 Muscle weakness (generalized): Secondary | ICD-10-CM

## 2017-01-29 NOTE — Therapy (Signed)
[facility] [address] [place], [place], [address] Phone: [phone]   Fax:  [phone]  Physical Therapy Treatment  Patient Details  Name: Crystal Miller MRN: [id number] Date of Birth: 11/24/1945 Referring Provider: Everlene OtherJayce Cook DO  Encounter Date: 01/29/2017      PT End of Session - 01/29/17 1357    Visit Number 9   Number of Visits 16   Date for PT Re-Evaluation 02/26/17   Authorization Type 9/ 10 G Code   PT Start Time 1345   PT Stop Time 1430   PT Time Calculation (min) 45 min   Activity Tolerance Patient tolerated treatment well   Behavior During Therapy [facility] for tasks assessed/performed      Past Medical History:  Diagnosis Date  . DM (diabetes mellitus), 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  . Essential hypertension 11/01/2016  . Mixed hyperlipidemia 11/01/2016  . Stroke [facility](HCC)     Past Surgical History:  Procedure Laterality Date  . CHOLECYSTECTOMY      There were no vitals filed for this visit.      Subjective Assessment - 01/29/17 1355    Subjective Patient reports no major changes since the previous visit. Patient reports she does not move much at home secondary to not being able to drive and go anywhere.    Pertinent History PMH: Diabetes type II, HTN, Osteoporosis, CVA on 10/04/16   Limitations Lifting;Standing;Walking   How long can you stand comfortably? 1 min   How long can you walk comfortably? 30min    Diagnostic tests MRI: Positive   Patient Stated Goals Not to be dependent with walker   Currently in Pain? No/denies        Therapeutic Exercise Nustep level 5 - x 10min with cueing on speed of performance  Hip abduction in standing - 2x20 Hip Extension in standing - 2x 20  Ambulation with SPC - x46600ft with focus on improving heel strike  Squats with UE support at treadmill - 2 x 20  Widened tandem stance ant/post weight shift -- x 20   Patient demonstrates increased LE  fatigue at the end of the session        PT Education - 01/29/17 1356    Education provided Yes   Education Details form/technique with exercise   Person(s) Educated Patient   Methods Explanation;Demonstration   Comprehension Verbalized understanding;Returned demonstration             PT Long Term Goals - 01/01/17 1052      PT LONG TERM GOAL #1   Title Patient will be independent with HEP to continue benefits of therapy after discharge   Baseline Dependent with exercise performance and progression   Time 8   Period Weeks   Status New   Target Date 02/26/17     PT LONG TERM GOAL #2   Title Patient will improve TUG to <20sec to demonstrate significant improvement in fall risk.    Baseline TUG: 35secs   Time 8   Period Weeks   Status New   Target Date 01/29/17     PT LONG TERM GOAL #3   Title Patient will improve 6minWT to >109200ft improve LE endurance and improve ability to walk throughout the grocery store.    Baseline 6minWT: 52650ft   Time 8   Period Weeks   Status New   Target Date 02/26/17     PT LONG TERM GOAL #4   Title Patient will  improve scores to >1.0 m/s to demonstrate improvement with community ambulation without a RW.   Baseline .87m/s with rollator   Time 8   Period Weeks   Status New   Target Date 02/26/17     PT LONG TERM GOAL #5   Title Patient will improve 5XSTS to under 16sec to demonstrate significant improvement in LE functional strength and decreased fall risk   Baseline 42sec   Time 8   Period Weeks   Status New   Target Date 02/26/17               Plan - 01/29/17 1359    Clinical Impression Statement Continued to focus on improving LE strength and balance with activity. Patient continues to demonstrate decreased standing and ambulating tolerance requiring frequent breaks during session. Patient demonstrates decreased strength and endurance with exercises and will benefit from further skilled therapy to return to prior  level of function.    Rehab Potential Good   Clinical Impairments Affecting Rehab Potential (+) Highly motivated, family support (-) age, diabetes   PT Frequency 2x / week   PT Duration 8 weeks   PT Treatment/Interventions Gait training;Moist Heat;Therapeutic activities;Therapeutic exercise;Balance training;Patient/family education;Neuromuscular re-education;Stair training;Functional mobility training;Passive range of motion;Manual techniques;Electrical Stimulation   PT Next Visit Plan Progress strengthening   PT Home Exercise Plan See education section   Consulted and Agree with Plan of Care Patient      Patient will benefit from skilled therapeutic intervention in order to improve the following deficits and impairments:  Abnormal gait, Decreased endurance, Decreased strength, Decreased balance, Difficulty walking, Decreased mobility  Visit Diagnosis: Muscle weakness (generalized)  Difficulty in walking, not elsewhere classified     Problem List Patient Active Problem List   Diagnosis Date Noted  . DM (diabetes mellitus), 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  . Essential hypertension 11/01/2016  . Mixed hyperlipidemia 11/01/2016  . Memory change 11/01/2016  . History of stroke 10/01/2016    Myrene Galas, PT DPT 01/29/2017, 2:25 PM   [facility] PHYSICAL AND SPORTS MEDICINE [address] [place], [address] Phone: [phone]   Fax:  [phone]  Name: Crystal Miller MRN: [id number] Date of Birth: [birth year]

## 2017-01-31 ENCOUNTER — Ambulatory Visit: Payer: Medicare Other

## 2017-02-05 ENCOUNTER — Ambulatory Visit: Payer: Medicare Other

## 2017-02-05 DIAGNOSIS — M6281 Muscle weakness (generalized): Secondary | ICD-10-CM | POA: Diagnosis not present

## 2017-02-05 DIAGNOSIS — R262 Difficulty in walking, not elsewhere classified: Secondary | ICD-10-CM

## 2017-02-05 NOTE — Therapy (Signed)
[facility] [facility] MEDICINE [address], [place], [address] Phone: [phone]   Fax:  [phone]  Physical Therapy Treatment  Patient Details  Name: Crystal Miller MRN: [id number] Date of Birth: 02-Jan-1946 Referring Provider: Everlene Other DO  Encounter Date: 02/05/2017      PT End of Session - 02/05/17 1435    Visit Number 10   Number of Visits 16   Date for PT Re-Evaluation 02/26/17   Authorization Type 10/ 10 G Code   PT Start Time 1345   PT Stop Time 1430   PT Time Calculation (min) 45 min   Activity Tolerance Patient tolerated treatment well   Behavior During Therapy [facility] for tasks assessed/performed      Past Medical History:  Diagnosis Date  . DM (diabetes mellitus), 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  . Essential hypertension 11/01/2016  . Mixed hyperlipidemia 11/01/2016  . Stroke [facility])     Past Surgical History:  Procedure Laterality Date  . CHOLECYSTECTOMY      There were no vitals filed for this visit.      Subjective Assessment - 02/05/17 1355    Subjective Patient reports she is getting lazy and states she performs motions slowly at home. Patient reports she walks around a lot at home but continues to lay down often throughout the day.    Pertinent History PMH: Diabetes type II, HTN, Osteoporosis, CVA on 10/04/16   Limitations Lifting;Standing;Walking   How long can you stand comfortably? 1 min   How long can you walk comfortably?    Diagnostic tests MRI: Positive   Patient Stated Goals Not to be dependent with walker   Currently in Pain? No/denies        Therapeutic Exercise Nustep level 5 - x with cueing on speed of performance  Hip abduction in standing - 2x20 Squats with UE support at treadmill - 2 x 20  Side Stepping up and down at 7" step - x 20 with UE support  Ambulation with SPC - x524ft with focus on improving heel strike    Patient responds well to therapy with  increased LE fatigue at end of session        PT Education - 02/05/17 1434    Education provided Yes   Education Details form/technique with exercise   Person(s) Educated Patient   Methods Explanation;Demonstration   Comprehension Verbalized understanding;Returned demonstration             PT Long Term Goals - 01/01/17 1052      PT LONG TERM GOAL #1   Title Patient will be independent with HEP to continue benefits of therapy after discharge   Baseline Dependent with exercise performance and progression   Time 8   Period Weeks   Status New   Target Date 02/26/17     PT LONG TERM GOAL #2   Title Patient will improve TUG to <20sec to demonstrate significant improvement in fall risk.    Baseline TUG: 35secs   Time 8   Period Weeks   Status New   Target Date 01/29/17     PT LONG TERM GOAL #3   Title Patient will improve to >1026ft improve LE endurance and improve ability to walk throughout the grocery store.    Baseline : 566ft   Time 8   Period Weeks   Status New   Target Date 02/26/17     PT LONG TERM GOAL #4  Title Patient will improve scores to >1.0 m/s to demonstrate improvement with community ambulation without a RW.   Baseline .50m/s with rollator   Time 8   Period Weeks   Status New   Target Date 02/26/17     PT LONG TERM GOAL #5   Title Patient will improve 5XSTS to under 16sec to demonstrate significant improvement in LE functional strength and decreased fall risk   Baseline 42sec   Time 8   Period Weeks   Status New   Target Date 02/26/17               Plan - 02-22-2017 1435    Clinical Impression Statement Patient demonstrates improvement ambulating speed and endurancce today with ability to ambulate 531ft without a rest break with an SPC. Patient demonstrates improvement with gait speed with using the [facility] but continues to be slower then using her rollator for ambulation. Patient will benefit from further skilled therapy  focsued on improving limitations to return to prior level of function.    Rehab Potential Good   Clinical Impairments Affecting Rehab Potential (+) Highly motivated, family support (-) age, diabetes   PT Frequency 2x / week   PT Duration 8 weeks   PT Treatment/Interventions Gait training;Moist Heat;Therapeutic activities;Therapeutic exercise;Balance training;Patient/family education;Neuromuscular re-education;Stair training;Functional mobility training;Passive range of motion;Manual techniques;Electrical Stimulation   PT Next Visit Plan Progress strengthening   PT Home Exercise Plan See education section   Consulted and Agree with Plan of Care Patient      Patient will benefit from skilled therapeutic intervention in order to improve the following deficits and impairments:  Abnormal gait, Decreased endurance, Decreased strength, Decreased balance, Difficulty walking, Decreased mobility  Visit Diagnosis: Muscle weakness (generalized)  Difficulty in walking, not elsewhere classified       G-Codes - 02/22/17 1441    Functional Assessment Tool Used (Outpatient Only) 5XSTS, , , TUG, clinical judgement   Functional Limitation Mobility: Walking and moving around   Mobility: Walking and Moving Around Current Status ([id number]) At least 40 percent but less than 60 percent impaired, limited or restricted   Mobility: Walking and Moving Around Goal Status [phone]) At least 1 percent but less than 20 percent impaired, limited or restricted      Problem List Patient Active Problem List   Diagnosis Date Noted  . DM (diabetes mellitus), 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  . Essential hypertension 11/01/2016  . Mixed hyperlipidemia 11/01/2016  . Memory change 11/01/2016  . History of stroke 10/01/2016    Myrene Galas, PT DPT 2017/02/22, 2:42 PM  [facility] [facility] PHYSICAL AND SPORTS MEDICINE [address] [place], [address] Phone:  [phone]   Fax:  [phone]  Name: Crystal Miller MRN: [id number] Date of Birth: 09/01/1945

## 2017-02-07 ENCOUNTER — Encounter: Payer: Self-pay | Admitting: Physical Therapy

## 2017-02-07 ENCOUNTER — Ambulatory Visit: Payer: Medicare Other | Admitting: Physical Therapy

## 2017-02-07 DIAGNOSIS — R262 Difficulty in walking, not elsewhere classified: Secondary | ICD-10-CM

## 2017-02-07 DIAGNOSIS — M6281 Muscle weakness (generalized): Secondary | ICD-10-CM | POA: Diagnosis not present

## 2017-02-07 NOTE — Therapy (Signed)
[place] [facility] PH[facility]ICINE [address] [place], [address] Phone: [phone]   Fax:  [phone]  Physical Therapy Treatment  Patient Details  Name: Crystal Miller MRN: [id number] Date of Birth: 1945/11/13 Referring Provider: Everlene Other DO  Encounter Date: 02/07/2017      PT End of Session - 02/07/17 1347    Visit Number 11   Number of Visits 16   Date for PT Re-Evaluation 02/26/17   Authorization Type 1/ 10 G Code   PT Start Time 1346   PT Stop Time 1426   PT Time Calculation (min) 40 min   Activity Tolerance Patient tolerated treatment well   Behavior During Therapy [facility] for tasks assessed/performed      Past Medical History:  Diagnosis Date  . DM (diabetes mellitus), 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  . Essential hypertension 11/01/2016  . Mixed hyperlipidemia 11/01/2016  . Stroke [facility])     Past Surgical History:  Procedure Laterality Date  . CHOLECYSTECTOMY      There were no vitals filed for this visit.      Subjective Assessment - 02/07/17 1349    Subjective Pt reports she is feeling fine today.  No new complaints or concerns.  She would like to continue working on her walking.    Pertinent History PMH: Diabetes type II, HTN, Osteoporosis, CVA on 10/04/16   Limitations Lifting;Standing;Walking   How long can you stand comfortably? 1 min   How long can you walk comfortably?    Diagnostic tests MRI: Positive   Patient Stated Goals Not to be dependent with walker   Currently in Pain? No/denies      TREATMENT  Therapeutic Exercise:  Ambulating in gym with SPC and cues for heel strike and increased speed.  First 100 ft 1 min 25 sec.  Second 100 ft with cues for speed, 1 min 12 sec.  Lateral walking x10 ft x2 each direction.  LAQ with 5# ankle weight x15 each LE Seated marching with 5# ankle weight x15 each LE Seated Bil hip abduction isometric with strap.  10 second holds x15.  Squats  with UE support at treadmill - x15 Marching in place x20 each LE with cues for increased hip flexion throughout.  Pt initially demonstrates compensatory hip Abd which improved following cues and demonstration.            PT Education - 02/07/17 1346    Education provided Yes   Education Details Exercise technique; gait mechanics   Person(s) Educated Patient   Methods Explanation;Demonstration;Verbal cues   Comprehension Verbalized understanding;Returned demonstration;Verbal cues required;Need further instruction             PT Long Term Goals - 01/01/17 1052      PT LONG TERM GOAL #1   Title Patient will be independent with HEP to continue benefits of therapy after discharge   Baseline Dependent with exercise performance and progression   Time 8   Period Weeks   Status New   Target Date 02/26/17     PT LONG TERM GOAL #2   Title Patient will improve TUG to <20sec to demonstrate significant improvement in fall risk.    Baseline TUG: 35secs   Time 8   Period Weeks   Status New   Target Date 01/29/17     PT LONG TERM GOAL #3   Title Patient will improve to >1031ft improve LE endurance and improve ability to walk throughout  the grocery store.    Baseline : 554ft   Time 8   Period Weeks   Status New   Target Date 02/26/17     PT LONG TERM GOAL #4   Title Patient will improve scores to >1.0 m/s to demonstrate improvement with community ambulation without a RW.   Baseline .98m/s with rollator   Time 8   Period Weeks   Status New   Target Date 02/26/17     PT LONG TERM GOAL #5   Title Patient will improve 5XSTS to under 16sec to demonstrate significant improvement in LE functional strength and decreased fall risk   Baseline 42sec   Time 8   Period Weeks   Status New   Target Date 02/26/17               Plan - 02/07/17 1413    Clinical Impression Statement Pt demonstrates improved gait mechanics and speed with cues to focus on both  when ambulating with SPC.  Her daughter has ordered her a [facility] and pt will bring it to her PT session once it arrives. She demonstrates fatigue with endurace activities such as marching in place and ambulating.  She demonstrates fatigue with strengthening exercises but performs well with verbal cues.  She will benefit from continued skilled PT interventions for improved strength, balance, and gait mechanics.    Rehab Potential Good   Clinical Impairments Affecting Rehab Potential (+) Highly motivated, family support (-) age, diabetes   PT Frequency 2x / week   PT Duration 8 weeks   PT Treatment/Interventions Gait training;Moist Heat;Therapeutic activities;Therapeutic exercise;Balance training;Patient/family education;Neuromuscular re-education;Stair training;Functional mobility training;Passive range of motion;Manual techniques;Electrical Stimulation   PT Next Visit Plan Progress strengthening   PT Home Exercise Plan See education section   Consulted and Agree with Plan of Care Patient      Patient will benefit from skilled therapeutic intervention in order to improve the following deficits and impairments:  Abnormal gait, Decreased endurance, Decreased strength, Decreased balance, Difficulty walking, Decreased mobility  Visit Diagnosis: Muscle weakness (generalized)  Difficulty in walking, not elsewhere classified     Problem List Patient Active Problem List   Diagnosis Date Noted  . DM (diabetes mellitus), 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  . Essential hypertension 11/01/2016  . Mixed hyperlipidemia 11/01/2016  . Memory change 11/01/2016  . History of stroke 10/01/2016    Encarnacion Chu PT, DPT 02/07/2017, 2:26 PM  [place] [facility] PHYSICAL AND SPORTS MEDICINE [address] [place], [address] Phone: [phone]   Fax:  [phone]  Name: Crystal Miller MRN: [id number] Date of Birth: [birth year]

## 2017-02-11 ENCOUNTER — Ambulatory Visit: Payer: Medicare Other

## 2017-02-11 DIAGNOSIS — M6281 Muscle weakness (generalized): Secondary | ICD-10-CM

## 2017-02-11 DIAGNOSIS — R262 Difficulty in walking, not elsewhere classified: Secondary | ICD-10-CM

## 2017-02-11 NOTE — Therapy (Signed)
[place] [facility] [address] [place], [address] Phone: [phone]   Fax:  [phone]  Physical Therapy Treatment  Patient Details  Name: Crystal Miller MRN: [id number] Date of Birth: Feb 01, 1946 Referring Provider: Everlene Other DO  Encounter Date: 02/11/2017      PT End of Session - 02/11/17 1432    Visit Number 12   Number of Visits 16   Date for PT Re-Evaluation 02/26/17   Authorization Type 2/ 10 G Code   PT Start Time 1345   PT Stop Time 1430   PT Time Calculation (min) 45 min   Activity Tolerance Patient tolerated treatment well   Behavior During Therapy [facility] for tasks assessed/performed      Past Medical History:  Diagnosis Date  . DM (diabetes mellitus), 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  . Essential hypertension 11/01/2016  . Mixed hyperlipidemia 11/01/2016  . Stroke [facility])     Past Surgical History:  Procedure Laterality Date  . CHOLECYSTECTOMY      There were no vitals filed for this visit.      Subjective Assessment - 02/11/17 1430    Subjective Patinet reports she would like to work on walking today. Patient states she's been performing more walking and ADL performance at home.   Pertinent History PMH: Diabetes type II, HTN, Osteoporosis, CVA on 10/04/16   Limitations Lifting;Standing;Walking   How long can you stand comfortably? 1 min   How long can you walk comfortably?    Diagnostic tests MRI: Positive   Patient Stated Goals Not to be dependent with walker   Currently in Pain? No/denies        Therapeutic Exercise Nustep level 5 - x with cueing on speed of performance  Side stepping up and over airex pad- x 20 with unilateral UE support  Standing step taps onto two cones - x  20 on airex B no UE but requires UE support Standing semi-tandem with airex - x20 Squats with UE support at treadmill - 2 x 20  Ambulation with SPC - x659ft with focus on improving heel  strike    Patient responds well to therapy with increased LE fatigue at end of session          PT Education - 02/11/17 1431    Education provided Yes   Education Details Form/technique with exercise performance   Person(s) Educated Patient   Methods Explanation;Demonstration   Comprehension Verbalized understanding;Returned demonstration             PT Long Term Goals - 01/01/17 1052      PT LONG TERM GOAL #1   Title Patient will be independent with HEP to continue benefits of therapy after discharge   Baseline Dependent with exercise performance and progression   Time 8   Period Weeks   Status New   Target Date 02/26/17     PT LONG TERM GOAL #2   Title Patient will improve TUG to <20sec to demonstrate significant improvement in fall risk.    Baseline TUG: 35secs   Time 8   Period Weeks   Status New   Target Date 01/29/17     PT LONG TERM GOAL #3   Title Patient will improve to >1065ft improve LE endurance and improve ability to walk throughout the grocery store.    Baseline : 510ft   Time 8   Period Weeks   Status New   Target Date 02/26/17  PT LONG TERM GOAL #4   Title Patient will improve scores to >1.0 m/s to demonstrate improvement with community ambulation without a RW.   Baseline .36m/s with rollator   Time 8   Period Weeks   Status New   Target Date 02/26/17     PT LONG TERM GOAL #5   Title Patient will improve 5XSTS to under 16sec to demonstrate significant improvement in LE functional strength and decreased fall risk   Baseline 42sec   Time 8   Period Weeks   Status New   Target Date 02/26/17               Plan - 02/11/17 1442    Clinical Impression Statement Patient demonstrates improved endurance with walking with ability to walk 153ft further compared to previous session indicating fuctional carryover between sessions. Patient demonstrates decreased balance in standing requiring UE support with exercise  performance indicating poor propioception and static balance. Patient will benefit from further skilled therapy to return to prior level of function.    Rehab Potential Good   Clinical Impairments Affecting Rehab Potential (+) Highly motivated, family support (-) age, diabetes   PT Frequency 2x / week   PT Duration 8 weeks   PT Treatment/Interventions Gait training;Moist Heat;Therapeutic activities;Therapeutic exercise;Balance training;Patient/family education;Neuromuscular re-education;Stair training;Functional mobility training;Passive range of motion;Manual techniques;Electrical Stimulation   PT Next Visit Plan Progress strengthening   PT Home Exercise Plan See education section   Consulted and Agree with Plan of Care Patient      Patient will benefit from skilled therapeutic intervention in order to improve the following deficits and impairments:  Abnormal gait, Decreased endurance, Decreased strength, Decreased balance, Difficulty walking, Decreased mobility  Visit Diagnosis: Muscle weakness (generalized)  Difficulty in walking, not elsewhere classified     Problem List Patient Active Problem List   Diagnosis Date Noted  . DM (diabetes mellitus), 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  . Essential hypertension 11/01/2016  . Mixed hyperlipidemia 11/01/2016  . Memory change 11/01/2016  . History of stroke 10/01/2016    Myrene Galas, PT DPT 02/11/2017, 3:08 PM  [place] [facility] PHYSICAL AND SPORTS MEDICINE [address] [place], [address] Phone: [phone]   Fax:  [phone]  Name: Crystal Miller MRN: [id number] Date of Birth: 11/13/1945

## 2017-02-14 ENCOUNTER — Ambulatory Visit: Payer: Medicare Other

## 2017-02-14 DIAGNOSIS — M6281 Muscle weakness (generalized): Secondary | ICD-10-CM | POA: Diagnosis not present

## 2017-02-14 DIAGNOSIS — R262 Difficulty in walking, not elsewhere classified: Secondary | ICD-10-CM

## 2017-02-14 NOTE — Therapy (Signed)
[place] [facility] PH[facility]TS [facility]ICINE [address], [place], [address] Phone: [phone]   Fax:  [phone]  Physical Therapy Treatment  Patient Details  Name: Crystal Miller MRN: [id number] Date of Birth: Jun 22, 1945 Referring Provider: Everlene Other DO  Encounter Date: 02/14/2017      PT End of Session - 02/14/17 1427    Visit Number 13   Number of Visits 16   Date for PT Re-Evaluation 02/26/17   Authorization Type 3/ 10 G Code   PT Start Time 1345   PT Stop Time 1430   PT Time Calculation (min) 45 min   Activity Tolerance Patient tolerated treatment well   Behavior During Therapy [facility] for tasks assessed/performed      Past Medical History:  Diagnosis Date  . DM (diabetes mellitus), 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  . Essential hypertension 11/01/2016  . Mixed hyperlipidemia 11/01/2016  . Stroke [facility])     Past Surgical History:  Procedure Laterality Date  . CHOLECYSTECTOMY      There were no vitals filed for this visit.      Subjective Assessment - 02/14/17 1352    Subjective Patient reports she's been walking at home regularing; patient reports no major changes since the previous visit.    Pertinent History PMH: Diabetes type II, HTN, Osteoporosis, CVA on 10/04/16   Limitations Lifting;Standing;Walking   How long can you stand comfortably? 1 min   How long can you walk comfortably?    Diagnostic tests MRI: Positive   Patient Stated Goals Not to be dependent with walker   Currently in Pain? No/denies      Therapeutic Exercise Nustep level 5 - x with cueing on speed of performance  Side stepping down and back on airex beam - x 20 with unilateral UE support  Step ups onto 6" step - x 15 B Sit to stands from raised chair - 2 x 10  Hip abduction in standing with UE support - x 20  Ambulation with SPC - x574ft with focus on improving heel strike and gait speed    Patient responds well to therapy  with increased LE fatigue at end of session       PT Education - 02/14/17 1427    Education provided Yes   Education Details form/technique with exercise   Person(s) Educated Patient   Methods Explanation;Demonstration   Comprehension Verbalized understanding;Returned demonstration             PT Long Term Goals - 01/01/17 1052      PT LONG TERM GOAL #1   Title Patient will be independent with HEP to continue benefits of therapy after discharge   Baseline Dependent with exercise performance and progression   Time 8   Period Weeks   Status New   Target Date 02/26/17     PT LONG TERM GOAL #2   Title Patient will improve TUG to <20sec to demonstrate significant improvement in fall risk.    Baseline TUG: 35secs   Time 8   Period Weeks   Status New   Target Date 01/29/17     PT LONG TERM GOAL #3   Title Patient will improve to >1026ft improve LE endurance and improve ability to walk throughout the grocery store.    Baseline : 519ft   Time 8   Period Weeks   Status New   Target Date 02/26/17     PT LONG TERM GOAL #4  Title Patient will improve scores to >1.0 m/s to demonstrate improvement with community ambulation without a RW.   Baseline .40m/s with rollator   Time 8   Period Weeks   Status New   Target Date 02/26/17     PT LONG TERM GOAL #5   Title Patient will improve 5XSTS to under 16sec to demonstrate significant improvement in LE functional strength and decreased fall risk   Baseline 42sec   Time 8   Period Weeks   Status New   Target Date 02/26/17               Plan - 02/14/17 1433    Clinical Impression Statement Focused on improving gait speed when ambulating today. Patient able to maintain increased speed along straight passaways, but has difficulty maintaining speed when performing turns/curves. Patient demonstrates improvement with ambulation and standing with less postural sway. Patient will benefit from further skilled  therapy to return to prior level of function.    Rehab Potential Good   Clinical Impairments Affecting Rehab Potential (+) Highly motivated, family support (-) age, diabetes   PT Frequency 2x / week   PT Duration 8 weeks   PT Treatment/Interventions Gait training;Moist Heat;Therapeutic activities;Therapeutic exercise;Balance training;Patient/family education;Neuromuscular re-education;Stair training;Functional mobility training;Passive range of motion;Manual techniques;Electrical Stimulation   PT Next Visit Plan Progress strengthening   PT Home Exercise Plan See education section   Consulted and Agree with Plan of Care Patient      Patient will benefit from skilled therapeutic intervention in order to improve the following deficits and impairments:  Abnormal gait, Decreased endurance, Decreased strength, Decreased balance, Difficulty walking, Decreased mobility  Visit Diagnosis: Muscle weakness (generalized)  Difficulty in walking, not elsewhere classified     Problem List Patient Active Problem List   Diagnosis Date Noted  . DM (diabetes mellitus), 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  . Essential hypertension 11/01/2016  . Mixed hyperlipidemia 11/01/2016  . Memory change 11/01/2016  . History of stroke 10/01/2016    Myrene Galas, PT DPT 02/14/2017, 2:46 PM  [facility] PHYSICAL AND SPORTS MEDICINE [address] [place], [address] Phone: [phone]   Fax:  [phone]  Name: Marisue Canion MRN: [id number] Date of Birth: Feb 19, 1946

## 2017-02-19 ENCOUNTER — Ambulatory Visit: Payer: Medicare Other | Attending: Family Medicine

## 2017-02-19 DIAGNOSIS — M6281 Muscle weakness (generalized): Secondary | ICD-10-CM | POA: Insufficient documentation

## 2017-02-19 DIAGNOSIS — R262 Difficulty in walking, not elsewhere classified: Secondary | ICD-10-CM | POA: Insufficient documentation

## 2017-02-19 DIAGNOSIS — R29818 Other symptoms and signs involving the nervous system: Secondary | ICD-10-CM | POA: Insufficient documentation

## 2017-02-19 DIAGNOSIS — R278 Other lack of coordination: Secondary | ICD-10-CM | POA: Insufficient documentation

## 2017-02-21 ENCOUNTER — Ambulatory Visit: Payer: Medicare Other

## 2017-02-21 DIAGNOSIS — R262 Difficulty in walking, not elsewhere classified: Secondary | ICD-10-CM

## 2017-02-21 DIAGNOSIS — R278 Other lack of coordination: Secondary | ICD-10-CM | POA: Diagnosis present

## 2017-02-21 DIAGNOSIS — M6281 Muscle weakness (generalized): Secondary | ICD-10-CM

## 2017-02-21 DIAGNOSIS — R29818 Other symptoms and signs involving the nervous system: Secondary | ICD-10-CM | POA: Diagnosis present

## 2017-02-21 NOTE — Therapy (Signed)
[place] [facility] PH[facility]ORTS MEDICINE [address] [place], [address] Phone: [phone]   Fax:  [phone]  Physical Therapy Treatment  Patient Details  Name: Crystal Miller MRN: [id number] Date of Birth: [birth year] Referring Provider: Everlene Other DO  Encounter Date: 02/21/2017      PT End of Session - 02/21/17 1657    Visit Number 14   Number of Visits 16   Date for PT Re-Evaluation 02/26/17   Authorization Type 4/ 10 G Code   PT Start Time 1645   PT Stop Time 1730   PT Time Calculation (min) 45 min   Activity Tolerance Patient tolerated treatment well   Behavior During Therapy [facility] for tasks assessed/performed      Past Medical History:  Diagnosis Date  . DM (diabetes mellitus), 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  . Essential hypertension 11/01/2016  . Mixed hyperlipidemia 11/01/2016  . Stroke [facility])     Past Surgical History:  Procedure Laterality Date  . CHOLECYSTECTOMY      There were no vitals filed for this visit.      Subjective Assessment - 02/21/17 1656    Subjective Patient reports she's been walking and doing activites around the house such as sweeping and cooking. Patient also mentions she's able to transfer in and out of the tub without use of her UE for support. Patient states she's feeling stronger.    Pertinent History PMH: Diabetes type II, HTN, Osteoporosis, CVA on 10/04/16   Limitations Lifting;Standing;Walking   How long can you stand comfortably? 1 min   How long can you walk comfortably?    Diagnostic tests MRI: Positive   Patient Stated Goals Not to be dependent with walker   Currently in Pain? No/denies      Therapeutic Exercise Nustep level 5 - x with cueing on speed of performance  Ambulation with SPC - x562ft with focus on improving heel strike and gait speed Sit to stands from raised chair -  x 10; sit to stands with hands overhead - x 10 Step ups onto 6" step - x 10  B Heel raises in standing - x 20 B Step over 4 point cane - x 10 B each leg   Patient responds well to therapy with increased LE fatigue at end of session       PT Education - 02/21/17 1657    Education provided Yes   Education Details form/technique with exercise   Person(s) Educated Patient   Methods Explanation;Demonstration   Comprehension Verbalized understanding;Returned demonstration             PT Long Term Goals - 01/01/17 1052      PT LONG TERM GOAL #1   Title Patient will be independent with HEP to continue benefits of therapy after discharge   Baseline Dependent with exercise performance and progression   Time 8   Period Weeks   Status New   Target Date 02/26/17     PT LONG TERM GOAL #2   Title Patient will improve TUG to <20sec to demonstrate significant improvement in fall risk.    Baseline TUG: 35secs   Time 8   Period Weeks   Status New   Target Date 01/29/17     PT LONG TERM GOAL #3   Title Patient will improve to >1072ft improve LE endurance and improve ability to walk throughout the grocery store.    Baseline : 566ft   Time 8  Period Weeks   Status New   Target Date 02/26/17     PT LONG TERM GOAL #4   Title Patient will improve scores to >1.0 m/s to demonstrate improvement with community ambulation without a RW.   Baseline .76m/s with rollator   Time 8   Period Weeks   Status New   Target Date 02/26/17     PT LONG TERM GOAL #5   Title Patient will improve 5XSTS to under 16sec to demonstrate significant improvement in LE functional strength and decreased fall risk   Baseline 42sec   Time 8   Period Weeks   Status New   Target Date 02/26/17               Plan - 02/21/17 1707    Clinical Impression Statement Patient demonstrates improvement with walking speed and gait today with ability to perform 155ft in under 70sec (fastest previously was 77sec) indicating functional carryover and decreased fall risk  compared to previous visits. Although patient is improving, she continues to demonstrate decreased dynamic and static balance as indicated by requiring UE support for standing exercises. Patient will benefit from further skilled therapy to return to prior level of function.    Rehab Potential Good   Clinical Impairments Affecting Rehab Potential (+) Highly motivated, family support (-) age, diabetes   PT Frequency 2x / week   PT Duration 8 weeks   PT Treatment/Interventions Gait training;Moist Heat;Therapeutic activities;Therapeutic exercise;Balance training;Patient/family education;Neuromuscular re-education;Stair training;Functional mobility training;Passive range of motion;Manual techniques;Electrical Stimulation   PT Next Visit Plan Progress strengthening   PT Home Exercise Plan See education section   Consulted and Agree with Plan of Care Patient      Patient will benefit from skilled therapeutic intervention in order to improve the following deficits and impairments:  Abnormal gait, Decreased endurance, Decreased strength, Decreased balance, Difficulty walking, Decreased mobility  Visit Diagnosis: Muscle weakness (generalized)  Difficulty in walking, not elsewhere classified     Problem List Patient Active Problem List   Diagnosis Date Noted  . DM (diabetes mellitus), 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  . Essential hypertension 11/01/2016  . Mixed hyperlipidemia 11/01/2016  . Memory change 11/01/2016  . History of stroke 10/01/2016    Myrene Galas, PT DPT 02/21/2017, 5:29 PM  [place] [facility] PHYSICAL AND SPORTS MEDICINE [address], [place], [address] Phone: [phone]   Fax:  [phone]  Name: Palma Buster MRN: [id number] Date of Birth: 1946-05-20

## 2017-02-26 ENCOUNTER — Ambulatory Visit: Payer: Medicare Other

## 2017-02-26 DIAGNOSIS — R262 Difficulty in walking, not elsewhere classified: Secondary | ICD-10-CM

## 2017-02-26 DIAGNOSIS — M6281 Muscle weakness (generalized): Secondary | ICD-10-CM

## 2017-02-26 NOTE — Therapy (Signed)
[place] [facility]ICINE [address] [place], [address] Phone: [phone]   Fax:  [phone]  Physical Therapy Treatment  Patient Details  Name: Crystal Miller MRN: [id number] Date of Birth: 10/27/1945 Referring Provider: Everlene Other DO  Encounter Date: 02/26/2017      PT End of Session - 02/26/17 1533    Visit Number 15   Number of Visits 25   Date for PT Re-Evaluation 03/26/17   Authorization Type 5/ 10 G Code   PT Start Time 1315   PT Stop Time 1400   PT Time Calculation (min) 45 min   Activity Tolerance Patient tolerated treatment well   Behavior During Therapy [facility] for tasks assessed/performed      Past Medical History:  Diagnosis Date  . DM (diabetes mellitus), 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  . Essential hypertension 11/01/2016  . Mixed hyperlipidemia 11/01/2016  . Stroke [facility])     Past Surgical History:  Procedure Laterality Date  . CHOLECYSTECTOMY      There were no vitals filed for this visit.      Subjective Assessment - 02/26/17 1512    Subjective Patient reports she continues to feel she is getting stronger with exercise performance and feel more stable on her feet.    Pertinent History PMH: Diabetes type II, HTN, Osteoporosis, CVA on 10/04/16   Limitations Lifting;Standing;Walking   How long can you stand comfortably? 1 min   How long can you walk comfortably?    Diagnostic tests MRI: Positive   Patient Stated Goals Not to be dependent with walker   Currently in Pain? No/denies      Therapeutic Exercise Nustep level 6 - x with cueing on speed of performance  Ambulation with SPC - x744ft with focus on improving heel strike and gait speed Sit to stands from raised chair - 2 x 5 without UE support cued to perform as fast as possible Side Stepping with UE support from PT - 8 x 67ft  Ambulation - 6 x 18ft with focus on improving arm swing   Patient responds well to  therapy with increased LE fatigue at end of session       PT Education - 02/26/17 1533    Education provided Yes   Education Details POC; form/technique with exercise   Person(s) Educated Patient   Methods Explanation;Demonstration   Comprehension Verbalized understanding;Returned demonstration             PT Long Term Goals - 02/26/17 1515      PT LONG TERM GOAL #1   Title Patient will be independent with HEP to continue benefits of therapy after discharge   Baseline Dependent with exercise performance and progression   Time 8   Period Weeks   Status On-going     PT LONG TERM GOAL #2   Title Patient will improve TUG to <20sec to demonstrate significant improvement in fall risk.    Baseline TUG: 35secs; 10/9.18: 26sec   Time 8   Period Weeks   Status On-going     PT LONG TERM GOAL #3   Title Patient will improve to >1053ft improve LE endurance and improve ability to walk throughout the grocery store.    Baseline : 555ft; 02/26/2017: 726ft   Time 8   Period Weeks   Status On-going     PT LONG TERM GOAL #4   Title Patient will improve scores to >1.0 m/s to demonstrate  improvement with community ambulation without a RW.   Baseline .35m/s with rollator; 02/26/17: .16m/s with SPC; .71 m/s   Time 8   Period Weeks   Status On-going     PT LONG TERM GOAL #5   Title Patient will improve 5XSTS to under 16sec to demonstrate significant improvement in LE functional strength and decreased fall risk   Baseline 42sec; 02/26/17: 5xSTS: 21sec   Time 8   Period Weeks   Status On-going               Plan - 02/26/17 1539    Clinical Impression Statement Patient is making progress towards long term goals with improvement in TUG, 5xSTS, , and with around 33% improvement in all tests performed. Although patient is improving she continues to demonstrate decreased walking ability without use of AD or use of cane. Patient demonstrates increased LE  weakness and decreased balance; patient will benefit from further skilled therapy to return to prior level of function.    Rehab Potential Good   Clinical Impairments Affecting Rehab Potential (+) Highly motivated, family support (-) age, diabetes   PT Frequency 2x / week   PT Duration 8 weeks   PT Treatment/Interventions Gait training;Moist Heat;Therapeutic activities;Therapeutic exercise;Balance training;Patient/family education;Neuromuscular re-education;Stair training;Functional mobility training;Passive range of motion;Manual techniques;Electrical Stimulation   PT Next Visit Plan Progress strengthening   PT Home Exercise Plan See education section   Consulted and Agree with Plan of Care Patient      Patient will benefit from skilled therapeutic intervention in order to improve the following deficits and impairments:  Abnormal gait, Decreased endurance, Decreased strength, Decreased balance, Difficulty walking, Decreased mobility  Visit Diagnosis: Muscle weakness (generalized)  Difficulty in walking, not elsewhere classified     Problem List Patient Active Problem List   Diagnosis Date Noted  . DM (diabetes mellitus), 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  . Essential hypertension 11/01/2016  . Mixed hyperlipidemia 11/01/2016  . Memory change 11/01/2016  . History of stroke 10/01/2016    Myrene Galas, PT DPT 02/26/2017, 3:56 PM  [place] [facility] PHYSICAL AND SPORTS MEDICINE [address] [place], [address] Phone: [phone]   Fax:  [phone]  Name: Makeya Hilgert MRN: [id number] Date of Birth: 1946-01-27

## 2017-02-26 NOTE — Addendum Note (Signed)
Addended by: Bethanie Dicker on: 02/26/2017 04:29 PM   Modules accepted: Orders

## 2017-02-28 ENCOUNTER — Ambulatory Visit: Payer: Medicare Other

## 2017-02-28 DIAGNOSIS — R262 Difficulty in walking, not elsewhere classified: Secondary | ICD-10-CM

## 2017-02-28 DIAGNOSIS — M6281 Muscle weakness (generalized): Secondary | ICD-10-CM | POA: Diagnosis not present

## 2017-02-28 NOTE — Therapy (Signed)
[place] [facility] PH[facility]ICINE [address] [place], [address] Phone: [phone]   Fax:  [phone]  Physical Therapy Treatment  Patient Details  Name: Crystal Miller MRN: [id number] Date of Birth: [birth year] Referring Provider: Everlene Other DO  Encounter Date: 02/28/2017      PT End of Session - 02/28/17 1018    Visit Number 16   Number of Visits 25   Date for PT Re-Evaluation 03/26/17   Authorization Type 6/ 10 G Code   PT Start Time 0950   PT Stop Time 1030   PT Time Calculation (min) 40 min   Activity Tolerance Patient tolerated treatment well   Behavior During Therapy [facility] for tasks assessed/performed      Past Medical History:  Diagnosis Date  . DM (diabetes mellitus), 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  . Essential hypertension 11/01/2016  . Mixed hyperlipidemia 11/01/2016  . Stroke [facility])     Past Surgical History:  Procedure Laterality Date  . CHOLECYSTECTOMY      There were no vitals filed for this visit.      Subjective Assessment - 02/28/17 0951    Subjective Patient reports she's been walking at home without the walker with less difficulty compared to previously. Patient reports she would like to continue to work on her walking and balancing.    Pertinent History PMH: Diabetes type II, HTN, Osteoporosis, CVA on 10/04/16   Limitations Lifting;Standing;Walking   How long can you stand comfortably? 1 min   How long can you walk comfortably?    Diagnostic tests MRI: Positive   Patient Stated Goals Not to be dependent with walker   Currently in Pain? No/denies      Therapeutic Exercise Nustep level 6 - x with cueing on speed of performance  Ambulation with SPC - x885ft with focus on improving heel strike and gait speed Ambulation with assist with two canes performing arm swing - x 273ft Side stepping up and over airex pad - x 10 with UE support with two finger support Feet together  balance on half foam roller - x 20 for 5 sec holds Heel raises on airex pad with UE support - x 20   Patient responds well to therapy with increased LE fatigue at end of session       PT Education - 02/28/17 1017    Education provided Yes   Education Details form/technique with exercise   Person(s) Educated Patient   Methods Explanation;Demonstration   Comprehension Verbalized understanding;Returned demonstration             PT Long Term Goals - 02/26/17 1515      PT LONG TERM GOAL #1   Title Patient will be independent with HEP to continue benefits of therapy after discharge   Baseline Dependent with exercise performance and progression   Time 8   Period Weeks   Status On-going     PT LONG TERM GOAL #2   Title Patient will improve TUG to <20sec to demonstrate significant improvement in fall risk.    Baseline TUG: 35secs; 10/9.18: 26sec   Time 8   Period Weeks   Status On-going     PT LONG TERM GOAL #3   Title Patient will improve to >1068ft improve LE endurance and improve ability to walk throughout the grocery store.    Baseline : 552ft; 02/26/2017: 759ft   Time 8   Period Weeks   Status On-going  PT LONG TERM GOAL #4   Title Patient will improve scores to >1.0 m/s to demonstrate improvement with community ambulation without a RW.   Baseline .93m/s with rollator; 02/26/17: .45m/s with SPC; .71 m/s   Time 8   Period Weeks   Status On-going     PT LONG TERM GOAL #5   Title Patient will improve 5XSTS to under 16sec to demonstrate significant improvement in LE functional strength and decreased fall risk   Baseline 42sec; 02/26/17: 5xSTS: 21sec   Time 8   Period Weeks   Status On-going               Plan - 02/28/17 1026    Clinical Impression Statement Patient demonstrates improvement with ambulation with improved arm swing compared to previous visits indicating functional carryover. Although patient is improving she continues to  demonstrate decreased ability to static stance with standing requiring UE support to perform most balancing exercises. Patient will benefit from further skilled therapy to return to prior level of funciton.    Rehab Potential Good   Clinical Impairments Affecting Rehab Potential (+) Highly motivated, family support (-) age, diabetes   PT Frequency 2x / week   PT Duration 8 weeks   PT Treatment/Interventions Gait training;Moist Heat;Therapeutic activities;Therapeutic exercise;Balance training;Patient/family education;Neuromuscular re-education;Stair training;Functional mobility training;Passive range of motion;Manual techniques;Electrical Stimulation   PT Next Visit Plan Progress strengthening   PT Home Exercise Plan See education section   Consulted and Agree with Plan of Care Patient      Patient will benefit from skilled therapeutic intervention in order to improve the following deficits and impairments:  Abnormal gait, Decreased endurance, Decreased strength, Decreased balance, Difficulty walking, Decreased mobility  Visit Diagnosis: Muscle weakness (generalized)  Difficulty in walking, not elsewhere classified     Problem List Patient Active Problem List   Diagnosis Date Noted  . DM (diabetes mellitus), 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  . Essential hypertension 11/01/2016  . Mixed hyperlipidemia 11/01/2016  . Memory change 11/01/2016  . History of stroke 10/01/2016    Myrene Galas, PT DPT 02/28/2017, 10:29 AM  [place] [facility] PHYSICAL AND SPORTS MEDICINE [address], [place], [address] Phone: [phone]   Fax:  [phone]  Name: Crystal Miller MRN: [id number] Date of Birth: 08-Jul-1945

## 2017-03-04 NOTE — Addendum Note (Signed)
Addended by: Myrene Galas V on: 03/04/2017 01:00 PM   Modules accepted: Orders

## 2017-03-05 ENCOUNTER — Ambulatory Visit: Payer: Medicare Other

## 2017-03-05 DIAGNOSIS — M6281 Muscle weakness (generalized): Secondary | ICD-10-CM | POA: Diagnosis not present

## 2017-03-05 DIAGNOSIS — R262 Difficulty in walking, not elsewhere classified: Secondary | ICD-10-CM

## 2017-03-05 NOTE — Therapy (Signed)
[place] [facility] PH[facility]TS MEDICINE [address] [place], [address] Phone: [phone]   Fax:  [phone]  Physical Therapy Treatment  Patient Details  Name: Crystal Miller MRN: [id number] Date of Birth: 1945-11-12 Referring Provider: Everlene Other DO  Encounter Date: 03/05/2017      PT End of Session - 03/05/17 1051    Visit Number 17   Number of Visits 25   Date for PT Re-Evaluation 03/26/17   Authorization Type 7/ 10 G Code   PT Start Time 1030   PT Stop Time 1115   PT Time Calculation (min) 45 min   Activity Tolerance Patient tolerated treatment well   Behavior During Therapy [facility] for tasks assessed/performed      Past Medical History:  Diagnosis Date  . DM (diabetes mellitus), 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  . Essential hypertension 11/01/2016  . Mixed hyperlipidemia 11/01/2016  . Stroke [facility])     Past Surgical History:  Procedure Laterality Date  . CHOLECYSTECTOMY      There were no vitals filed for this visit.      Subjective Assessment - 03/05/17 1036    Subjective Patient reports she had one instance of feeling like she was "going to pass out". Patient reports no onset of blurred vision, trouble speaking, or drop attacks during or after the performance.    Pertinent History PMH: Diabetes type II, HTN, Osteoporosis, CVA on 10/04/16   Limitations Lifting;Standing;Walking   How long can you stand comfortably? 1 min   How long can you walk comfortably?    Diagnostic tests MRI: Positive   Patient Stated Goals Not to be dependent with walker   Currently in Pain? No/denies      Therapeutic Exercise Nustep level 4 - x 10 min with cueing on speed of performance  Hip abduction in standing with minimal UE support - x 20 Heel raises in standing with UE support - x 25 Hip extension in standing with holding - x 20  Feet together balance on half foam roller - x 20  Side stepping up and over airex - x  20 Standing Marches with unilateral support - x 15  (Performed frequent long rest breaks between exercises secondary to increased blood pressure with exercise performance)   Patient responds well to therapy with increased LE fatigue at end of session       PT Education - 03/05/17 1050    Education provided Yes   Education Details Educating on importance of BP regulation    Person(s) Educated Patient   Methods Explanation;Demonstration   Comprehension Verbalized understanding;Returned demonstration             PT Long Term Goals - 02/26/17 1515      PT LONG TERM GOAL #1   Title Patient will be independent with HEP to continue benefits of therapy after discharge   Baseline Dependent with exercise performance and progression   Time 8   Period Weeks   Status On-going     PT LONG TERM GOAL #2   Title Patient will improve TUG to <20sec to demonstrate significant improvement in fall risk.    Baseline TUG: 35secs; 10/9.18: 26sec   Time 8   Period Weeks   Status On-going     PT LONG TERM GOAL #3   Title Patient will improve to >101ft improve LE endurance and improve ability to walk throughout the grocery store.    Baseline : 572ft; 02/26/2017: 765ft  Time 8   Period Weeks   Status On-going     PT LONG TERM GOAL #4   Title Patient will improve scores to >1.0 m/s to demonstrate improvement with community ambulation without a RW.   Baseline .64m/s with rollator; 02/26/17: .82m/s with SPC; .71 m/s   Time 8   Period Weeks   Status On-going     PT LONG TERM GOAL #5   Title Patient will improve 5XSTS to under 16sec to demonstrate significant improvement in LE functional strength and decreased fall risk   Baseline 42sec; 02/26/17: 5xSTS: 21sec   Time 8   Period Weeks   Status On-going               Plan - 03/05/17 1054    Clinical Impression Statement Performed greater amount of rest breaks and decreased intensity of exercise secondary to  patient's higher blood pressure upon entering the clinic (164/39mmHg). Patient also reports she had not taken one of her BP medications today secondary to not having any left. Patient reports she is going to the doctors tomorrow to have a refill on her perscription. No onset of stroke-like symptoms during or after therapy session. Patient will benefit from further skiled therapy focused on improving limitations to return to prior level of function.    Rehab Potential Good   Clinical Impairments Affecting Rehab Potential (+) Highly motivated, family support (-) age, diabetes   PT Frequency 2x / week   PT Duration 8 weeks   PT Treatment/Interventions Gait training;Moist Heat;Therapeutic activities;Therapeutic exercise;Balance training;Patient/family education;Neuromuscular re-education;Stair training;Functional mobility training;Passive range of motion;Manual techniques;Electrical Stimulation   PT Next Visit Plan Progress strengthening   PT Home Exercise Plan See education section   Consulted and Agree with Plan of Care Patient      Patient will benefit from skilled therapeutic intervention in order to improve the following deficits and impairments:  Abnormal gait, Decreased endurance, Decreased strength, Decreased balance, Difficulty walking, Decreased mobility  Visit Diagnosis: Muscle weakness (generalized)  Difficulty in walking, not elsewhere classified     Problem List Patient Active Problem List   Diagnosis Date Noted  . DM (diabetes mellitus), 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  . Essential hypertension 11/01/2016  . Mixed hyperlipidemia 11/01/2016  . Memory change 11/01/2016  . History of stroke 10/01/2016    Myrene Galas, PT DPT 03/05/2017, 1:08 PM  [place] [facility] PHYSICAL AND SPORTS MEDICINE [address], [place], [address] Phone: [phone]   Fax:  [phone]  Name: Crystal Miller MRN: [id number] Date of Birth:  1945-06-01

## 2017-03-06 ENCOUNTER — Ambulatory Visit (INDEPENDENT_AMBULATORY_CARE_PROVIDER_SITE_OTHER): Payer: Medicare Other | Admitting: Family Medicine

## 2017-03-06 ENCOUNTER — Ambulatory Visit: Payer: Medicare Other | Admitting: Family Medicine

## 2017-03-06 ENCOUNTER — Telehealth: Payer: Self-pay

## 2017-03-06 ENCOUNTER — Ambulatory Visit
Admission: RE | Admit: 2017-03-06 | Discharge: 2017-03-06 | Disposition: A | Discharge status: Home / Self Care | Payer: Medicare Other | Source: Ambulatory Visit | Attending: Family Medicine | Admitting: Family Medicine

## 2017-03-06 ENCOUNTER — Encounter: Payer: Self-pay | Admitting: Family Medicine

## 2017-03-06 VITALS — BP 160/80 | HR 80 | Temp 98.6°F | Wt 132.8 lb

## 2017-03-06 DIAGNOSIS — I639 Cerebral infarction, unspecified: Secondary | ICD-10-CM | POA: Diagnosis not present

## 2017-03-06 DIAGNOSIS — I6389 Other cerebral infarction: Secondary | ICD-10-CM | POA: Diagnosis not present

## 2017-03-06 DIAGNOSIS — I739 Peripheral vascular disease, unspecified: Secondary | ICD-10-CM | POA: Diagnosis not present

## 2017-03-06 DIAGNOSIS — I10 Essential (primary) hypertension: Secondary | ICD-10-CM | POA: Diagnosis not present

## 2017-03-06 DIAGNOSIS — Z8673 Personal history of transient ischemic attack (TIA), and cerebral infarction without residual deficits: Secondary | ICD-10-CM

## 2017-03-06 DIAGNOSIS — R278 Other lack of coordination: Secondary | ICD-10-CM | POA: Diagnosis not present

## 2017-03-06 DIAGNOSIS — E118 Type 2 diabetes mellitus with unspecified complications: Secondary | ICD-10-CM | POA: Diagnosis not present

## 2017-03-06 LAB — POCT GLYCOSYLATED HEMOGLOBIN (HGB A1C): Hemoglobin A1C: 6.5

## 2017-03-06 MED ORDER — LISINOPRIL 20 MG PO TABS: 20.0000 mg | ORAL_TABLET | Freq: Every day | ORAL | 2 refills | Status: DC

## 2017-03-06 MED ORDER — AMLODIPINE BESYLATE 5 MG PO TABS: 5.0000 mg | ORAL_TABLET | Freq: Every day | ORAL | 3 refills | Status: DC

## 2017-03-06 NOTE — Patient Instructions (Signed)
Nice to see you. We're going to get an MRI to evaluate for another stroke. If you develop any new symptoms such as numbness, weakness, dizziness, headache, vision changes, or any new or changing symptoms please seek medical attention.

## 2017-03-06 NOTE — Telephone Encounter (Addendum)
Noted. Findings consistent with chronic infarction noted previously. No new stroke noted. No need for emergent evaluation at this time. She will need to see neurology in follow-up and continue PT. Could also have her see OT for evaluation as well. Please inform the patient.  Thanks.

## 2017-03-06 NOTE — Telephone Encounter (Signed)
Spoke with the patient, reviewed results, she is wanting to get the OT set up also.  Thanks

## 2017-03-06 NOTE — Progress Notes (Signed)
Marikay Alar, MD Phone: [phone]  Crystal Miller is a 71 y.o. female who presents today for follow-up.  History of stroke: Patient had a stroke in May of this past year. Started with dizziness. Subsequently developed weakness. Daughter reports some new coordination issues over the last week. First noticed symptoms a week ago. They've remained stable since then. Reports difficulty trying to open a jar with the left hand which the daughter reports is new over the last week as well. Patient reports she's been doing well with physical therapy and progressing well. She does note she got a little lightheaded 5 days ago and sat down and this resolved. It was not vertigo. She was reaching up above her head to get something when this occurred. She does note she had a slight headache on Friday and Saturday of last week which was 5-6 days ago. Notes it was right side of her head. Did resolve. No vision changes.  Diabetes: Fastings are less than 150. Taking metformin. Does note some polyuria and polydipsia. No hypoglycemia.  Hypertension: Typically running 131-150/62-80. Taking Norvasc and lisinopril. No chest pain or shortness of breath. No edema. Patient has not taken her amlodipine or losartan today as she ran out of these medications.  PMH: Former smoker   ROS see history of present illness  Objective  Physical Exam Vitals:   03/06/17 1016  BP: (!) 160/80  Pulse: 80  Temp: 98.6 F (37 C)  SpO2: 98%    BP Readings from Last 3 Encounters:  03/06/17 (!) 160/80  12/04/16 (!) 144/80  11/01/16 (!) 160/78   Wt Readings from Last 3 Encounters:  03/06/17 132 lb 12.8 oz (60.2 kg)  12/04/16 140 lb 9.6 oz (63.8 kg)  11/01/16 140 lb 8 oz (63.7 kg)    Physical Exam  Constitutional: No distress.  Cardiovascular: Normal rate, regular rhythm and normal heart sounds.   Pulmonary/Chest: Effort normal and breath sounds normal.  Neurological: She is alert.  Right eye does not pass midline on  looking to the left, otherwise CN 2-12 intact, 4+/5 strength left biceps, triceps, grip, quads, hamstrings, plantar flexion, and dorsiflexion, 5/5 strength in right biceps, triceps, grip, quads, hamstrings, plantar and dorsiflexion, sensation to light touch intact in bilateral UE and LE, finger to nose abnormal with left hand, intact on the right, rapid alternating movement left hand abnormal, rapid alternating movement right hand normal, heel to shin difficult to accomplish given left-sided weakness, gait slowed favoring right side  Skin: Skin is warm and dry. She is not diaphoretic.     Assessment/Plan: Please see individual problem list.  Essential hypertension Refills given. Advised to take her medications consistently. BP likely elevated given she has not taken her medication today.  DM (diabetes mellitus), type 2 with complications (HCC) Check A1c. Continue metformin.  History of stroke Patient with prior stroke. Possibly some new cerebellar symptoms with some dysmetria and abnormal rapid alternating movements. Patient's daughter feels like she has noticed a difference. Given this we'll obtain MRI to rule out new CVA. If positive we will send to ED for evaluation. If negative for acute CVA we will have her work with occupational therapy and physical therapy on her deficits. Continue risk factor management. Given return precautions.   Orders Placed This Encounter  Procedures  . MR Brain Wo Contrast    Standing Status:   Future    Number of Occurrences:   1    Standing Expiration Date:   05/06/2018    Order Specific  Question:   What is the patient's sedation requirement?    Answer:   No Sedation    Order Specific Question:   Does the patient have a pacemaker or implanted devices?    Answer:   No    Order Specific Question:   Preferred imaging location?    Answer:   [facility] (table limit-300lbs)    Order Specific Question:   Radiology Contrast Protocol - do NOT remove file  path    Answer:   [email or web address]  . POCT HgB A1C    Meds ordered this encounter  Medications  . amLODipine (NORVASC) 5 MG tablet    Sig: Take 1 tablet (5 mg total) by mouth daily.    Dispense:  90 tablet    Refill:  3  . lisinopril (PRINIVIL,ZESTRIL) 20 MG tablet    Sig: Take 1 tablet (20 mg total) by mouth daily.    Dispense:  30 tablet    Refill:  2   Marikay AlarEric Stokely Jeancharles, MD [facility]

## 2017-03-06 NOTE — Assessment & Plan Note (Signed)
Check A1c.  Continue metformin. 

## 2017-03-06 NOTE — Assessment & Plan Note (Signed)
Patient with prior stroke. Possibly some new cerebellar symptoms with some dysmetria and abnormal rapid alternating movements. Patient's daughter feels like she has noticed a difference. Given this we'll obtain MRI to rule out new CVA. If positive we will send to ED for evaluation. If negative for acute CVA we will have her work with occupational therapy and physical therapy on her deficits. Continue risk factor management. Given return precautions.

## 2017-03-06 NOTE — Telephone Encounter (Signed)
Attempted to reach the patient, left a VM to return my call, thanks

## 2017-03-06 NOTE — Telephone Encounter (Signed)
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 No acute intracranial findings. Atrophy and small vessel disease.  Intracranial vasculature remains patent with special attention to the vertebral and basilar arteries.  Please advise

## 2017-03-06 NOTE — Telephone Encounter (Signed)
Pt called back returning your call. Please advise, thank you!  Call pt @ [phone]

## 2017-03-06 NOTE — Assessment & Plan Note (Signed)
Refills given. Advised to take her medications consistently. BP likely elevated given she has not taken her medication today.

## 2017-03-07 ENCOUNTER — Ambulatory Visit: Payer: Medicare Other | Attending: Neurology

## 2017-03-07 DIAGNOSIS — G4733 Obstructive sleep apnea (adult) (pediatric): Secondary | ICD-10-CM | POA: Diagnosis present

## 2017-03-07 NOTE — Telephone Encounter (Signed)
Order placed

## 2017-03-07 NOTE — Addendum Note (Signed)
Addended by: Glori LuisSONNENBERG, Linlee Cromie G on: 03/07/2017 03:44 PM   Modules accepted: Orders

## 2017-03-12 ENCOUNTER — Ambulatory Visit: Payer: Medicare Other

## 2017-03-12 DIAGNOSIS — M6281 Muscle weakness (generalized): Secondary | ICD-10-CM | POA: Diagnosis not present

## 2017-03-12 DIAGNOSIS — R262 Difficulty in walking, not elsewhere classified: Secondary | ICD-10-CM

## 2017-03-12 NOTE — Therapy (Signed)
[facility] PHYSICAL AND SPORTS MEDICINE [address] [place], [place], [address] Phone: [phone]   Fax:  [phone]  Physical Therapy Treatment  Patient Details  Name: Crystal MasseLinda Miller MRN: [id number] Date of Birth: 06/15/1945 Referring Provider: Everlene OtherJayce Cook DO  Encounter Date: 03/12/2017      PT End of Session - 03/12/17 1403    Visit Number 18   Number of Visits 25   Date for PT Re-Evaluation 03/26/17   Authorization Type 8/ 10 G Code   PT Start Time 1345   PT Stop Time 1430   PT Time Calculation (min) 45 min   Activity Tolerance Patient tolerated treatment well   Behavior During Therapy [facility] for tasks assessed/performed      Past Medical History:  Diagnosis Date  . DM (diabetes mellitus), 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  . Essential hypertension 11/01/2016  . Mixed hyperlipidemia 11/01/2016  . Stroke [facility](HCC)     Past Surgical History:  Procedure Laterality Date  . CHOLECYSTECTOMY      There were no vitals filed for this visit.      Subjective Assessment - 03/12/17 1357    Subjective Patient reports she been walking around at home and states she was able to cook a meal on Sunday. Patient reports she was able stand for a long period of time but got tired.    Pertinent History PMH: Diabetes type II, HTN, Osteoporosis, CVA on 10/04/16   Limitations Lifting;Standing;Walking   How long can you stand comfortably? 1 min   How long can you walk comfortably? 30min    Diagnostic tests MRI: Positive   Patient Stated Goals Not to be dependent with walker   Currently in Pain? No/denies        Therapeutic Exercise Nustep level 6 - x 5 min with cueing on speed of performance  Hip abduction with UE support - x20 B Hip extension with UE support - x 20 B  Figure 8 Pattern around 4 cones - x 10 B Balance stones marching - x 20 Balance static stance on balance stones - x 20  Ambulation with SPC with focus on improving step  length/stride length   BP: 144/90   Patient responds well to therapy with increased LE fatigue at end of session        PT Education - 03/12/17 1401    Education provided Yes   Education Details form/technique with exercise   Person(s) Educated Patient   Methods Explanation;Demonstration   Comprehension Verbalized understanding;Returned demonstration             PT Long Term Goals - 02/26/17 1515      PT LONG TERM GOAL #1   Title Patient will be independent with HEP to continue benefits of therapy after discharge   Baseline Dependent with exercise performance and progression   Time 8   Period Weeks   Status On-going     PT LONG TERM GOAL #2   Title Patient will improve TUG to <20sec to demonstrate significant improvement in fall risk.    Baseline TUG: 35secs; 10/9.18: 26sec   Time 8   Period Weeks   Status On-going     PT LONG TERM GOAL #3   Title Patient will improve 6minWT to >101900ft improve LE endurance and improve ability to walk throughout the grocery store.    Baseline 6minWT: 56050ft; 02/26/2017: 74500ft   Time 8   Period Weeks   Status On-going  PT LONG TERM GOAL #4   Title Patient will improve scores to >1.0 m/s to demonstrate improvement with community ambulation without a RW.   Baseline .32m/s with rollator; 02/26/17: .55m/s with SPC; .71 m/s   Time 8   Period Weeks   Status On-going     PT LONG TERM GOAL #5   Title Patient will improve 5XSTS to under 16sec to demonstrate significant improvement in LE functional strength and decreased fall risk   Baseline 42sec; 02/26/17: 5xSTS: 21sec   Time 8   Period Weeks   Status On-going               Plan - 03/12/17 1444    Clinical Impression Statement Patient demonstrates improvement with ambulation tolerance with ability to perform 8 laps (163ft each lap) before requiring sitting rest break secondary to fatigue. Although patient's walking endurance is improving, she continues to demonstrate  decreased ability to perform reciproical arm swing and decreased ambulation speed. Patient will benefit from further skilled therapy to return to prior level of function.    Rehab Potential Good   Clinical Impairments Affecting Rehab Potential (+) Highly motivated, family support (-) age, diabetes   PT Frequency 2x / week   PT Duration 8 weeks   PT Treatment/Interventions Gait training;Moist Heat;Therapeutic activities;Therapeutic exercise;Balance training;Patient/family education;Neuromuscular re-education;Stair training;Functional mobility training;Passive range of motion;Manual techniques;Electrical Stimulation   PT Next Visit Plan Progress strengthening   PT Home Exercise Plan See education section   Consulted and Agree with Plan of Care Patient      Patient will benefit from skilled therapeutic intervention in order to improve the following deficits and impairments:  Abnormal gait, Decreased endurance, Decreased strength, Decreased balance, Difficulty walking, Decreased mobility  Visit Diagnosis: Difficulty in walking, not elsewhere classified  Muscle weakness (generalized)     Problem List Patient Active Problem List   Diagnosis Date Noted  . DM (diabetes mellitus), 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  . Essential hypertension 11/01/2016  . Mixed hyperlipidemia 11/01/2016  . Memory change 11/01/2016  . History of stroke 10/01/2016    Myrene Galas, PT DPT 03/12/2017, 2:51 PM  [place] [facility] PHYSICAL AND SPORTS MEDICINE [address], [place], [address] Phone: [phone]   Fax:  [phone]  Name: Crystal Miller MRN: [id number] Date of Birth: [birth year]

## 2017-03-14 ENCOUNTER — Ambulatory Visit: Payer: Medicare Other | Admitting: Occupational Therapy

## 2017-03-14 ENCOUNTER — Encounter: Payer: Self-pay | Admitting: Occupational Therapy

## 2017-03-14 DIAGNOSIS — R29818 Other symptoms and signs involving the nervous system: Secondary | ICD-10-CM

## 2017-03-14 DIAGNOSIS — M6281 Muscle weakness (generalized): Secondary | ICD-10-CM

## 2017-03-14 DIAGNOSIS — R278 Other lack of coordination: Secondary | ICD-10-CM

## 2017-03-14 NOTE — Therapy (Signed)
[facility] PH[facility] [address] [place], [place], [address] Phone: [phone]   Fax:  [phone]  Occupational Therapy Evaluation  Patient Details  Name: Crystal MasseLinda Miller MRN: [id number] Date of Birth: 12/31/1945 Referring Provider: Dr Birdie SonsSonnenberg  Encounter Date: 03/14/2017      OT End of Session - 03/14/17 1028    Visit Number 1  1/10 G   Number of Visits 16   Date for OT Re-Evaluation 04/11/17  Every 10 th visit    Authorization Type Medicare Part A & B -Needs G Code every 10 visits   Authorization - Visit Number 1   Authorization - Number of Visits 10   OT Start Time [phone]   OT Stop Time 1020   OT Time Calculation (min) 54 min   Activity Tolerance Patient tolerated treatment well   Behavior During Therapy [facility] for tasks assessed/performed      Past Medical History:  Diagnosis Date  . DM (diabetes mellitus), 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  . Essential hypertension 11/01/2016  . Mixed hyperlipidemia 11/01/2016  . Stroke [facility](HCC)     Past Surgical History:  Procedure Laterality Date  . CHOLECYSTECTOMY      There were no vitals filed for this visit.      Subjective Assessment - 03/14/17 0930    Subjective  Pt presents to out-pt OT w/ c/o L sided weakness and difficulty performing daily activities following CVA in 10/04/16. She has been having balance issues as well and has been getting PT since 12/2016.   Patient is accompained by: Family member  Daughter drives pt to appointments   Pertinent History Diabetes type II; HTN; Osteoporosis, CVA 10/04/16 with left sided weakness. Pt is R HD.   Patient Stated Goals Increased strength and motion L arm and hand   Currently in Pain? 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   Referring Provider  Dr Birdie SonsSonnenberg   Onset Date 10/04/16   Prior Therapy Pt states that she had HHOT/PT through June 2018. PT for decreased balance.     Precautions   Precautions Fall     Restrictions   Weight Bearing Restrictions No     Balance Screen   Has the patient fallen in the past 6 months No   Has the patient had a decrease in activity level because of a fear of falling?  Yes  Does not drive   Is the patient reluctant to leave their home because of a fear of falling?  No  Stays home most of the time b/c no longer drives     Home  Environment   Family/patient expects to be discharged to: Private residence   Living Arrangements Other(Comment)  Daughter and great grandson   Available Help at Discharge Family   Type of Home Aartment   Home Access Level entry   Home Layout One level   Bathroom Shower/Tub Other (comment);Tub/Shower unit  Tub bench - pt does on her own   Information systems managerBathroom Toilet Standard   How accessible Accessible via walker   Home Biochemist, clinicalquipment Walker - 4 wheels;Tub bench;Toilet riser;Grab bars - toilet;Grab bars - tub/shower;Hand held shower head   Lives With Daughter  Daughter and great grandson     Prior Function   Level  of Independence Independent with basic ADLs;Needs assistance with homemaking   Vacuuming --  Daughter does, "I can't" per pt   Light Housekeeping Minimal   Shopping Moderate   Homemaking Assistance Comments --  Does light homemaking, family assists PRN   Driving No  No longer driving   Vocation Retired   Chief Operating Officer; TV, cooking (used to relieve tension"     ADL   Eating/Feeding Independent   Grooming Modified independent  Increased time secondary to decreased FM   Upper Body Bathing Modified independent   Lower Body Bathing Increased time   Upper Body Dressing Increased time   Lower Body Dressing Increased time   Toilet Transfer Modified independent   Toileting - Clothing Manipulation Modified independent   Toileting -  Hygiene Modified Independent    Tub/Shower Transfer Modified independent   ADL comments Overall Mod I with increased time for ADL's. Ocassional assistance to fasten necklace or open something that is difficult to open. Fatigues during ADL's and IADL's. No longer does shopping or driving. Difficulty reaching overhead for activities and generalized weakness.     IADL   Shopping Assistance for transportation  Daughter shops "I'm too slow & get tired"   Light Housekeeping Does personal laundry completely   Meal Prep Able to complete simple warm meal prep;Able to complete simple cold meal and snack prep  Does some simple meal prep in oven    Prior Level of Function Community Mobility Independent all activity   Community Mobility Relies on family or friends for transportation   Prior Level of Function Meal Prep Independent; Enjoyed cooking for stress relief   Medication Management Is responsible for taking medication in correct dosages at correct time  Pt plans weekly mediciations and daughter "checks"   Prior Level of Function Financial Management Independent   Financial Management Requires assistance  Daughter manages checking account but pt pays 1/2 bills     Mobility   Mobility Status Needs assist  Ambulates with 4 wheeled RW   Mobility Status Comments Fatigues easily, decreased endurance     Written Expression   Dominant Hand Right     Vision - History   Baseline Vision Wears glasses all the time   Additional Comments Pt reports that L eye weakness. Denies left sided inattention, blurred vision etc.     Vision Assessment   Eye Alignment Within Functional Limits   Comment Apperas WFL's overall. Continue to assess in functional context     Activity Tolerance   Activity Tolerance Tolerates 30 min activity with muliple rests   Activity Tolerance Comments Fatigues easily, takes rest breaks as needed at home     Cognition   Overall Cognitive Status Within Functional Limits for tasks assessed      Observation/Other Assessments   Observations Pt hands appear arthritic   Other Surveys  Select  PRWHE   Outcome Measures PRWHE: Pain = 0/50 Function: 40/100     Sensation   Light Touch Appears Intact   Additional Comments Pt reports paresthesias in left hand since CVA. Has h/o DM as well     Coordination   Gross Motor Movements are Fluid and Coordinated Yes   Fine Motor Movements are Fluid and Coordinated No   Other Modified Purdue Peg Test Activity (using 5 pegs): R = 25 seconds L = 33 Seconds (   Coordination Impaired Coordination/opposition and changing direction etc. Finger/Nose test impaired/slower on Left      ROM / Strength   AROM / PROM /  Strength Strength     Hand Function   Right Hand Grip (lbs) 41#   Right Hand Lateral Pinch 10 lbs   Right Hand 3 Point Pinch 10 lbs   Left Hand Grip (lbs) 38#   Left Hand Lateral Pinch 9.75 lbs   Left 3 point pinch 8 lbs     Functional Reaching Activities   Low Level WFL's   Mid Level Decreased AROM LUE   High Level Unable to raise arm overhead  L shoulder A/ROM impaired @  ~80*                         OT Education - 03/14/17 1027    Education provided Yes   Education Details Findings & recommendations of OT Assessment. Focus on general strengthening and FM coordination to assist with improved functional use LUE and improved ADL/IADL function.   Person(s) Educated Patient   Methods Explanation   Comprehension Verbalized understanding;Need further instruction          OT Short Term Goals - 03/14/17 1300      OT SHORT TERM GOAL #1   Title Pt will be Mod I HEP    Time 4   Period Weeks   Status New   Target Date 04/11/17     OT SHORT TERM GOAL #2   Title Pt will demonstrate imporved FM control as seen by improved time on modified Purdue peg test (by 3 seconds or more).   Baseline see initial Eval 03/14/17.   Time 4   Period Weeks   Status New     OT SHORT TERM GOAL #3   Title Pt will report Mod I  ADL's (for opening pill containers and during grooming tasks) using L hand/UE as non-dominant hand   Time 4   Period Weeks   Status New     OT SHORT TERM GOAL #4   Title Pt will I'ly state signs/symptoms of possible CVA   Time 4   Period Weeks   Status New           OT Long Term Goals - 03/14/17 1305      OT LONG TERM GOAL #1   Title Pt will be Mod I styling her hair using LUE as assist and a/ PRN   Time 8   Period Weeks   Status New   Target Date 05/09/17     OT LONG TERM GOAL #2   Title Pt will be I upgraded HEP LUE   Time 8   Period Weeks   Status New     OT LONG TERM GOAL #3   Title Pt will report improved functiona lactivity as observed by her ability to participate in simulated ADL tasks for or more w/o rest break   Time 8   Period Weeks   Status New     OT LONG TERM GOAL #4   Title Pt will demonstrate improved strength as seen by improved grip strength by 5# or more LUE   Baseline See initial Eval 03/14/17   Time 8   Period Weeks   Status New     OT LONG TERM GOAL #5   Title Pt will be I reaching into an overhead cabinet for a plate or bowl demonstrating good technique (No LOB) and overall strength bilateral UE's   Time 8   Period Weeks   Status New     Long Term Additional Goals   Additional Long  Term Goals Yes     OT LONG TERM GOAL #6   Title Pt will I'ly state 2-3 energy conservation techniques & possible a/e for increased independence, and implement during therapy sessions   Time 8   Period Weeks   Status New               Plan - 03/26/17 1249    Clinical Impression Statement Pt is a plesant 71 y/o RHD female s/p chronic brainstem infarction w/ Wallerian degeneration , atrophy and small vessel disease on 10/04/16. She initially had HHOT/PT and then in Aug of 2018, began out-pt PT for ambulation and balance issues. She presents to out-pt OT today per Dr Birdie Sons for evaluation and treatment for generalized L UE weakness and  decreased coordination. As per assessment, she has decreaesd endurance for daily activities, impaired A/ROM of her LUE, generalized weakness, impaired coordination and FM control impacting her ability to perform ADL, IADL's and leisure activities. She should benefit from out-pt OT to assist with pt/family education, issuing a HEP (A/ROM, coordination/FM control and strengthening) for her LUE to assist with increased independence and return to prior level of function w/ reguards to leisure activities (cooking, cleaning etc).   Occupational Profile and client history currently impacting functional performance Decreased coordination; decreased A/ROM LUE, L non-dominant hand, generalized weakness   Occupational performance deficits (Please refer to evaluation for details): ADL's;IADL's;Play;Leisure   Rehab Potential Fair   Current Impairments/barriers affecting progress: CVA 10/04/16; somewhat sedentary lifestyle   OT Frequency 1x / week   OT Duration 6 weeks   OT Treatment/Interventions Self-care/ADL training;Energy conservation;Therapeutic exercise;Therapeutic exercises;Patient/family education;Therapeutic activities;Neuromuscular education;Other (comment)  Balance training within respect to ADL's and mid to high level/overhead activities.   Plan Issue home program for A/ROM, A/AROM and coordination LUE.   Clinical Decision Making Several treatment options, min-mod task modification necessary   OT Home Exercise Plan See pt instructions   Consulted and Agree with Plan of Care Patient      Patient will benefit from skilled therapeutic intervention in order to improve the following deficits and impairments:  Decreased activity tolerance, Decreased endurance, Decreased range of motion, Impaired UE functional use, Decreased coordination, Decreased balance  Visit Diagnosis: Muscle weakness (generalized) - Plan: Ot plan of care cert/re-cert  Other lack of coordination - Plan: Ot plan of care  cert/re-cert  Other symptoms and signs involving the nervous system - Plan: Ot plan of care cert/re-cert      G-Codes - March 26, 2017 1317    Functional Assessment Tool Used (Outpatient only) PRWHE; Modified Purdue pegboard; Clinical judgement   Functional Limitation Self care   Self Care Current Status [phone]) At least 20 percent but less than 40 percent impaired, limited or restricted   Self Care Goal Status ([id number]) At least 1 percent but less than 20 percent impaired, limited or restricted      Problem List Patient Active Problem List   Diagnosis Date Noted  . DM (diabetes mellitus), 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  . Essential hypertension 11/01/2016  . Mixed hyperlipidemia 11/01/2016  . Memory change 11/01/2016  . History of stroke 10/01/2016    Mariam Dollar Dionicio Stall, OTR/L 03/26/17, 1:21 PM  [facility] PHYSICAL AND SPORTS MEDICINE [address], [place], [address] Phone: [phone]   Fax:  [phone]  Name: Crystal Miller MRN: [id number] Date of Birth: 12/21/1945

## 2017-03-18 ENCOUNTER — Ambulatory Visit: Payer: Medicare Other

## 2017-03-18 DIAGNOSIS — R29818 Other symptoms and signs involving the nervous system: Secondary | ICD-10-CM

## 2017-03-18 DIAGNOSIS — M6281 Muscle weakness (generalized): Secondary | ICD-10-CM | POA: Diagnosis not present

## 2017-03-18 DIAGNOSIS — R278 Other lack of coordination: Secondary | ICD-10-CM

## 2017-03-18 DIAGNOSIS — R262 Difficulty in walking, not elsewhere classified: Secondary | ICD-10-CM

## 2017-03-18 NOTE — Therapy (Signed)
[place] [facility] PHYSICAL AND SPORTS MEDICINE [address] [place], [place], [address] Phone: [phone]   Fax:  [phone]  Physical Therapy Treatment  Patient Details  Name: Crystal Miller MRN: [id number] Date of Birth: 11/05/1945 Referring Provider: Everlene OtherJayce Cook DO  Encounter Date: 03/18/2017      PT End of Session - 03/18/17 1413    Visit Number 19   Number of Visits 25   Date for PT Re-Evaluation 03/26/17   Authorization Type 9/ 10 G Code   PT Start Time 1345   PT Stop Time 1430   PT Time Calculation (min) 45 min   Activity Tolerance Patient tolerated treatment well   Behavior During Therapy [facility] for tasks assessed/performed      Past Medical History:  Diagnosis Date  . DM (diabetes mellitus), 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  . Essential hypertension 11/01/2016  . Mixed hyperlipidemia 11/01/2016  . Stroke [facility](H[facility])     Past Surgical History:  Procedure Laterality Date  . CHOLECYSTECTOMY      There were no vitals filed for this visit.      Subjective Assessment - 03/18/17 1402    Subjective Patient reports she had increased blood pressure when going to the doctors this morning. Patient states she's been to OT and said she feels like she does not need it.    Pertinent History PMH: Diabetes type II, HTN, Osteoporosis, CVA on 10/04/16   Limitations Lifting;Standing;Walking   How long can you stand comfortably? 1 min   How long can you walk comfortably? 30min    Diagnostic tests MRI: Positive   Patient Stated Goals Not to be dependent with walker   Currently in Pain? No/denies        TREATMENT: Therapeutic Exercise: Nustep: seat level 7 -- resistance level 1 for 5 min  Therapeutic Activity:  Ambulation with SPC focus on performing arm swing -- 2 x 300 ft with 5 min of rest after performing - to take BP measurements and allow for decrease in BP Ambulation without AD --  x 37600ft with focus on performing arm swing and  improving step and stride length With 5 min rest before to improve BP  Patient demonstrates no increase in symptoms througout the session           PT Education - 03/18/17 1412    Education provided Yes   Education Details Educated on blood pressure management at home   Person(s) Educated Patient   Methods Explanation;Demonstration   Comprehension Verbalized understanding;Returned demonstration             PT Long Term Goals - 02/26/17 1515      PT LONG TERM GOAL #1   Title Patient will be independent with HEP to continue benefits of therapy after discharge   Baseline Dependent with exercise performance and progression   Time 8   Period Weeks   Status On-going     PT LONG TERM GOAL #2   Title Patient will improve TUG to <20sec to demonstrate significant improvement in fall risk.    Baseline TUG: 35secs; 10/9.18: 26sec   Time 8   Period Weeks   Status On-going     PT LONG TERM GOAL #3   Title Patient will improve 6minWT to >104100ft improve LE endurance and improve ability to walk throughout the grocery store.    Baseline 6minWT: 58050ft; 02/26/2017: 7400ft   Time 8   Period Weeks   Status On-going  PT LONG TERM GOAL #4   Title Patient will improve scores to >1.0 m/s to demonstrate improvement with community ambulation without a RW.   Baseline .13m/s with rollator; 02/26/17: .23m/s with SPC; .71 m/s   Time 8   Period Weeks   Status On-going     PT LONG TERM GOAL #5   Title Patient will improve 5XSTS to under 16sec to demonstrate significant improvement in LE functional strength and decreased fall risk   Baseline 42sec; 02/26/17: 5xSTS: 21sec   Time 8   Period Weeks   Status On-going               Plan - 03/18/17 1414    Clinical Impression Statement Monitored BP heavily this session secondary to patient's reports of hypertension at her doctors appointment this morning. Patient demonstrates increased blood pressure at rest (170/90mmHg) which  raised to 188/80 after 357ft of walking and then decreased to 156/80 after 3 min of sitting rest break. Repeated this process with blood pressure raising to 178/80 after 300 ft and lowered to 160/80 after 3 min. Patient will benefit from further skilled therapy focused on improving cardiobvascular endurance with exercise and improving functional capacity.    Rehab Potential Good   Clinical Impairments Affecting Rehab Potential (+) Highly motivated, family support (-) age, diabetes   PT Frequency 2x / week   PT Duration 8 weeks   PT Treatment/Interventions Gait training;Moist Heat;Therapeutic activities;Therapeutic exercise;Balance training;Patient/family education;Neuromuscular re-education;Stair training;Functional mobility training;Passive range of motion;Manual techniques;Electrical Stimulation   PT Next Visit Plan Progress strengthening   PT Home Exercise Plan See education section   Consulted and Agree with Plan of Care Patient      Patient will benefit from skilled therapeutic intervention in order to improve the following deficits and impairments:  Abnormal gait, Decreased endurance, Decreased strength, Decreased balance, Difficulty walking, Decreased mobility  Visit Diagnosis: Muscle weakness (generalized)  Other lack of coordination  Other symptoms and signs involving the nervous system  Difficulty in walking, not elsewhere classified     Problem List Patient Active Problem List   Diagnosis Date Noted  . DM (diabetes mellitus), 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  . Essential hypertension 11/01/2016  . Mixed hyperlipidemia 11/01/2016  . Memory change 11/01/2016  . History of stroke 10/01/2016    Crystal Miller, PT DPT 03/18/2017, 2:37 PM  [place] [facility] PHYSICAL AND SPORTS MEDICINE [address], [place], [address] Phone: [phone]   Fax:  [phone]  Name: Crystal Miller MRN: [id number] Date of Birth:  1945-08-14

## 2017-03-20 ENCOUNTER — Ambulatory Visit: Payer: Medicare Other | Admitting: Occupational Therapy

## 2017-03-20 ENCOUNTER — Ambulatory Visit: Payer: Medicare Other

## 2017-03-20 DIAGNOSIS — M6281 Muscle weakness (generalized): Secondary | ICD-10-CM

## 2017-03-20 DIAGNOSIS — R29818 Other symptoms and signs involving the nervous system: Secondary | ICD-10-CM

## 2017-03-20 DIAGNOSIS — R278 Other lack of coordination: Secondary | ICD-10-CM

## 2017-03-20 DIAGNOSIS — R262 Difficulty in walking, not elsewhere classified: Secondary | ICD-10-CM

## 2017-03-20 NOTE — Therapy (Signed)
[place] [facility] PH[facility]ORTS MEDICINE [address], [place], [address] Phone: [phone]   Fax:  [phone]  Occupational Therapy Treatment  Patient Details  Name: Crystal Miller MRN: [id number] Date of Birth: [birth year] Referring Provider: Dr Birdie Sons  Encounter Date: 03/20/2017      OT End of Session - 03/20/17 1548    Visit Number 2   Number of Visits 16   Date for OT Re-Evaluation 04/11/17   Authorization Type Medicare Part A & B -Needs G Code every 10 visits   OT Start Time 1300   OT Stop Time 1345   OT Time Calculation (min) 45 min   Activity Tolerance Patient tolerated treatment well   Behavior During Therapy [facility] for tasks assessed/performed      Past Medical History:  Diagnosis Date  . DM (diabetes mellitus), 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  . Essential hypertension 11/01/2016  . Mixed hyperlipidemia 11/01/2016  . Stroke [facility])     Past Surgical History:  Procedure Laterality Date  . CHOLECYSTECTOMY      There were no vitals filed for this visit.      Subjective Assessment - 03/20/17 1542    Subjective  My shoulder is week and I can do my shoe laces and put ear rings in - but slow - and cannot over head reach - could do it prior to my stroke- did see MD - he said that I need to walk more    Patient Stated Goals Increased strength and motion L arm and hand   Currently in Pain? No/denies      Assess functional shoulder ROM   strength in elbow and wrist   functional hand function   Supine - done stabilization at 90 degrees   3 x 30 sec all directions Supine - retraction and protraction 1 lbs weight - 2 x 10 reps   needed min A to keep positiong and not bend elbow   Shoulder flexion 1 lbs over head  And shoulder reaching to R hip and over head and out - 1 lbs  10 reps  Mod A to not bend elbow - cause some pain in elbow- do straight arm - and work towards target - decrease coordination   RTB  for retraction of scapula And shoulder extention  Bilateral 12 reps  To do at home    [facility] - golf ball - for thumb <> fingers - from palm inbetween  Then golf ball thumb <> fingers - but do 4thand 5th separate and 2ndand 3rd  12 reps  Small object pick up - 4 - and hold in palm - out to finger tips again - 18 sec for 4  Could not do tripot on pen and walk up and down - work on pick up pen and work in hand to tripod grip                           OT Education - 03/20/17 1548    Education provided Yes   Education Details HEP for L shooulder and [facility]    Person(s) Educated Patient   Methods Explanation;Demonstration;Tactile cues;Verbal cues;Handout   Comprehension Verbal cues required;Returned demonstration;Verbalized understanding          OT Short Term Goals - 03/14/17 1300      OT SHORT TERM GOAL #1   Title Pt will be Mod I HEP    Time 4  Period Weeks   Status New   Target Date 04/11/17     OT SHORT TERM GOAL #2   Title Pt will demonstrate imporved FM control as seen by improved time on modified [employer] peg test (by 3 seconds or more).   Baseline see initial Eval 03/14/17.   Time 4   Period Weeks   Status New     OT SHORT TERM GOAL #3   Title Pt will report Mod I ADL's (for opening pill containers and during grooming tasks) using L hand/UE as non-dominant hand   Time 4   Period Weeks   Status New     OT SHORT TERM GOAL #4   Title Pt will I'ly state signs/symptoms of possible CVA   Time 4   Period Weeks   Status New           OT Long Term Goals - 03/14/17 1305      OT LONG TERM GOAL #1   Title Pt will be Mod I styling her hair using LUE as assist and a/ PRN   Time 8   Period Weeks   Status New   Target Date 05/09/17     OT LONG TERM GOAL #2   Title Pt will be I upgraded HEP LUE   Time 8   Period Weeks   Status New     OT LONG TERM GOAL #3   Title Pt will report improved functiona lactivity as observed by her ability to  participate in simulated ADL tasks for or more w/o rest break   Time 8   Period Weeks   Status New     OT LONG TERM GOAL #4   Title Pt will demonstrate improved strength as seen by improved grip strength by 5# or more LUE   Baseline See initial Eval 03/14/17   Time 8   Period Weeks   Status New     OT LONG TERM GOAL #5   Title Pt will be I reaching into an overhead cabinet for a plate or bowl demonstrating good technique (No LOB) and overall strength bilateral UE's   Time 8   Period Weeks   Status New     Long Term Additional Goals   Additional Long Term Goals Yes     OT LONG TERM GOAL #6   Title Pt will I'ly state 2-3 energy conservation techniques & possible a/e for increased independence, and implement during therapy sessions   Time 8   Period Weeks   Status New               Plan - 03/20/17 1548    Clinical Impression Statement Pt show decrease strength in L shoulder and scapula - decrease stabilization - decrease gross coordination - strength in elbow and wrist 4+/5 - but decrease speed and FMC in L hand- limiting her  in  ADL;s and IADL's    Occupational performance deficits (Please refer to evaluation for details): ADL's;IADL's;Play;Leisure   Rehab Potential Fair   Current Impairments/barriers affecting progress: CVA 10/04/16; somewhat sedentary lifestyle   OT Frequency 2x / week   OT Duration 6 weeks   OT Treatment/Interventions Self-care/ADL training;Energy conservation;Therapeutic exercise;Therapeutic exercises;Patient/family education;Therapeutic activities;Neuromuscular education;Other (comment)   Plan assess how she did with HEP   Clinical Decision Making Several treatment options, min-mod task modification necessary   OT Home Exercise Plan See pt instructions   Consulted and Agree with Plan of Care Patient      Patient will  benefit from skilled therapeutic intervention in order to improve the following deficits and impairments:  Decreased activity  tolerance, Decreased endurance, Decreased range of motion, Impaired UE functional use, Decreased coordination, Decreased balance  Visit Diagnosis: Muscle weakness (generalized)  Other lack of coordination  Other symptoms and signs involving the nervous system    Problem List Patient Active Problem List   Diagnosis Date Noted  . DM (diabetes mellitus), 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  . Essential hypertension 11/01/2016  . Mixed hyperlipidemia 11/01/2016  . Memory change 11/01/2016  . History of stroke 10/01/2016    Oletta CohnuPreez, Kellee Sittner OTR/L,CLT 03/20/2017, 3:51 PM  [place] [facility]ORTS MEDICINE [address] [place], [place], [address] Phone: [phone]   Fax:  [phone]  Name: Crystal MasseLinda Miller MRN: [id number] Date of Birth: 02/03/1946

## 2017-03-20 NOTE — Patient Instructions (Signed)
  Supine - done stabilization at 90 degrees   3 x 30 sec all directions Supine - retraction and protraction 1 lbs weight - 2 x 10 reps   needed min A to keep positiong and not bend elbow   Shoulder flexion 1 lbs over head  And shoulder reaching to R hip and over head and out - 1 lbs  10 reps  Mod A to not bend elbow - cause some pain in elbow- do straight arm - and work towards target - decrease coordination   RTB for retraction of scapula And shoulder extention  Bilateral 12 reps  To do at home    [facility] - golf ball - for thumb <> fingers - from palm inbetween  Then golf ball thumb <> fingers - but do 4thand 5th separate and 2ndand 3rd  12 reps  Small object pick up - 4 - and hold in palm - out to finger tips again - 18 sec for 4  Could not do tripot on pen and walk up and down - work on pick up pen and work in hand to tripod grip

## 2017-03-20 NOTE — Therapy (Signed)
[place] [facility] [address] [place], [place], [address] Phone: [phone]   Fax:  [phone]  Physical Therapy Treatment  Patient Details  Name: Crystal MasseLinda Miller MRN: [id number] Date of Birth: 09/27/1945 Referring Provider: Everlene OtherJayce Cook DO  Encounter Date: 03/20/2017      PT End of Session - 03/20/17 1403    Visit Number 20   Number of Visits 25   Date for PT Re-Evaluation 03/26/17   Authorization Type 10/ 10 G Code   PT Start Time 1352   PT Stop Time 1430   PT Time Calculation (min) 38 min   Activity Tolerance Patient tolerated treatment well   Behavior During Therapy [facility] for tasks assessed/performed      Past Medical History:  Diagnosis Date  . DM (diabetes mellitus), 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  . Essential hypertension 11/01/2016  . Mixed hyperlipidemia 11/01/2016  . Stroke [facility](HCC)     Past Surgical History:  Procedure Laterality Date  . CHOLECYSTECTOMY      There were no vitals filed for this visit.      Subjective Assessment - 03/20/17 1401    Subjective Patient reports she has been walking around the house more and states she was able to walk for a total time of 45 min outside since the previous treatment.    Pertinent History PMH: Diabetes type II, HTN, Osteoporosis, CVA on 10/04/16   Limitations Lifting;Standing;Walking   How long can you stand comfortably? 1 min   How long can you walk comfortably? 30min    Diagnostic tests MRI: Positive   Patient Stated Goals Not to be dependent with walker   Currently in Pain? No/denies      Therapeutic Exercise Nustep level 5 - x 5 min with cueing on speed of performance with focus on improving speed  Ambulation with walker for endurance -- 72650ft and focus on speed and arm swing Ambulation with SPC with focus on improving step length/stride length -- 2 x 2030ft  Sit to stands -- 3 x 5 without use of UE and focus on improving speed with  performance    Patient responds well to therapy with increased LE fatigue at end of session       PT Education - 03/20/17 1402    Education provided Yes   Education Details Form/technique with exercise    Person(s) Educated Patient   Methods Explanation;Demonstration   Comprehension Verbalized understanding;Returned demonstration             PT Long Term Goals - 03/20/17 1407      PT LONG TERM GOAL #1   Title Patient will be independent with HEP to continue benefits of therapy after discharge   Baseline Dependent with exercise performance and progression   Time 8   Period Weeks   Status On-going     PT LONG TERM GOAL #2   Title Patient will improve TUG to <20sec to demonstrate significant improvement in fall risk.    Baseline TUG: 35secs; 10/9.18: 26sec; 03/20/17: 22 sec    Time 8   Period Weeks   Status On-going     PT LONG TERM GOAL #3   Title Patient will improve 6minWT to >106700ft improve LE endurance and improve ability to walk throughout the grocery store.    Baseline 6minWT: 57350ft; 02/26/2017: 74700ft 03/20/17: 7425ft   Time 8   Period Weeks   Status On-going     PT LONG TERM GOAL #4  Title Patient will improve scores to >1.0 m/s to demonstrate improvement with community ambulation without a RW.   Baseline .13m/s with rollator; 02/26/17: .64m/s with SPC; .71 m/s; 04/19/17: .63m /s   Time 8   Period Weeks   Status On-going     PT LONG TERM GOAL #5   Title Patient will improve 5XSTS to under 16sec to demonstrate significant improvement in LE functional strength and decreased fall risk   Baseline 42sec; 02/26/17: 5xSTS: 21sec; 2017-04-19: 19sec   Time 8   Period Weeks   Status On-going               Plan - 04/19/2017 1420    Clinical Impression Statement Reassessed goals today and patient demonstrates improvement with all long term goals. Patient demonstrates continous improvement with TUG, 5xSTS, and indicating funcitonal improvement in  strength, endurance, and decreased fall risk. Although patient is improving, she continues to have risk factors for risk indicating increased risk of fall. Patient will benefit from further skilled therapy.    Rehab Potential Good   Clinical Impairments Affecting Rehab Potential (+) Highly motivated, family support (-) age, diabetes   PT Frequency 2x / week   PT Duration 8 weeks   PT Treatment/Interventions Gait training;Moist Heat;Therapeutic activities;Therapeutic exercise;Balance training;Patient/family education;Neuromuscular re-education;Stair training;Functional mobility training;Passive range of motion;Manual techniques;Electrical Stimulation   PT Next Visit Plan Progress strengthening   PT Home Exercise Plan See education section   Consulted and Agree with Plan of Care Patient      Patient will benefit from skilled therapeutic intervention in order to improve the following deficits and impairments:  Abnormal gait, Decreased endurance, Decreased strength, Decreased balance, Difficulty walking, Decreased mobility  Visit Diagnosis: Muscle weakness (generalized)  Other lack of coordination  Other symptoms and signs involving the nervous system  Difficulty in walking, not elsewhere classified       G-Codes - Apr 19, 2017 1412    Functional Assessment Tool Used (Outpatient Only) 5XSTS, , , TUG, clinical judgement   Functional Limitation Mobility: Walking and moving around   Mobility: Walking and Moving Around Current Status ([id number]) At least 20 percent but less than 40 percent impaired, limited or restricted   Mobility: Walking and Moving Around Goal Status [phone]) At least 1 percent but less than 20 percent impaired, limited or restricted      Problem List Patient Active Problem List   Diagnosis Date Noted  . DM (diabetes mellitus), 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  . Essential hypertension 11/01/2016  . Mixed hyperlipidemia 11/01/2016  . Memory change  11/01/2016  . History of stroke 10/01/2016    Myrene Galas, PT DPT 04/19/17, 2:35 PM  [place] [facility] PHYSICAL AND SPORTS MEDICINE [address] [place], [address] Phone: [phone]   Fax:  [phone]  Name: Crystal Miller MRN: [id number] Date of Birth: August 16, 1945

## 2017-03-25 ENCOUNTER — Ambulatory Visit: Payer: Medicare Other | Admitting: Occupational Therapy

## 2017-03-25 ENCOUNTER — Ambulatory Visit: Payer: Medicare Other | Attending: Family Medicine

## 2017-03-25 DIAGNOSIS — R278 Other lack of coordination: Secondary | ICD-10-CM | POA: Diagnosis present

## 2017-03-25 DIAGNOSIS — R29818 Other symptoms and signs involving the nervous system: Secondary | ICD-10-CM | POA: Diagnosis present

## 2017-03-25 DIAGNOSIS — M6281 Muscle weakness (generalized): Secondary | ICD-10-CM | POA: Insufficient documentation

## 2017-03-25 DIAGNOSIS — R262 Difficulty in walking, not elsewhere classified: Secondary | ICD-10-CM | POA: Diagnosis present

## 2017-03-25 NOTE — Patient Instructions (Addendum)
  Same shoulder HEP   Done 1 kg  ball on wall - for stabilization - pt had hard time with coordination to move palm on ball - pt to keep pressure and tap in all directions arm  3 x 30 sec at home with basketball     Same HEP but  Add rolling towel with L hand - thumb <> fingers  Patty cake - snap, clap, slap thigh, clap and snap  Also done with clap to OT hands inbetween - pt to do with grandson at home  8 x done

## 2017-03-25 NOTE — Therapy (Signed)
[facility] PH[facility] MEDICINE [address], [place], [address] Phone: [phone]   Fax:  [phone]  Physical Therapy Treatment  Patient Details  Name: Crystal [place] MRN: [id number] Date of Birth: 25-Mar-1946 Referring Provider: Everlene Other DO   Encounter Date: 03/25/2017  PT End of Session - 03/25/17 1310    Visit Number  21    Number of Visits  25    Date for PT Re-Evaluation  03/26/17    Authorization Type  1/ 10 G Code    PT Start Time  1303    PT Stop Time  1345    PT Time Calculation (min)  42 min    Activity Tolerance  Patient tolerated treatment well    Behavior During Therapy  [facility] for tasks assessed/performed       Past Medical History:  Diagnosis Date  . DM (diabetes mellitus), 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  . Essential hypertension 11/01/2016  . Mixed hyperlipidemia 11/01/2016  . Stroke [facility])     Past Surgical History:  Procedure Laterality Date  . CHOLECYSTECTOMY      There were no vitals filed for this visit.  Subjective Assessment - 03/25/17 1308    Subjective  Patient reports she "walked a lot this weekend" and reports she has OT after therapy today. Patient reports no major changes since the previous visit.     Pertinent History  PMH: Diabetes type II, HTN, Osteoporosis, CVA on 10/04/16    Limitations  Lifting;Standing;Walking    How long can you stand comfortably?  1 min    How long can you walk comfortably?      Diagnostic tests  MRI: Positive    Patient Stated Goals  Not to be dependent with walker    Currently in Pain?  No/denies       Therapeutic Exercise Nustep level 5 - x 3.5 min; level 4 x 3.93min with cueing on speed of performance with focus on improving speed  Hip Abduction in standing - x 20; with yellow band - x 20  Side stepping with YTB around knees - 4 x 68ft B Cone maze calling out each side for balance challenge - 4 x 15  Squats without UE support - x 25   Ambulation without UE support -- 546ft and focus on speed and arm swing   Patient responds well to therapy with increased LE fatigue at end of session   PT Education - 03/25/17 1310    Education provided  Yes    Education Details  Form/technique with exercise    Person(s) Educated  Patient    Methods  Explanation;Demonstration    Comprehension  Verbalized understanding;Returned demonstration          PT Long Term Goals - 03/20/17 1407      PT LONG TERM GOAL #1   Title  Patient will be independent with HEP to continue benefits of therapy after discharge    Baseline  Dependent with exercise performance and progression    Time  8    Period  Weeks    Status  On-going      PT LONG TERM GOAL #2   Title  Patient will improve TUG to <20sec to demonstrate significant improvement in fall risk.     Baseline  TUG: 35secs; 10/9.18: 26sec; 03/20/17: 22 sec     Time  8    Period  Weeks    Status  On-going  PT LONG TERM GOAL #3   Title  Patient will improve 6minWT to >102700ft improve LE endurance and improve ability to walk throughout the grocery store.     Baseline  6minWT: 52950ft; 02/26/2017: 74800ft 03/20/17: 73525ft    Time  8    Period  Weeks    Status  On-going      PT LONG TERM GOAL #4   Title  Patient will improve 10mWT scores to >1.0 m/s to demonstrate improvement with community ambulation without a RW.    Baseline  .7557m/s with rollator; 02/26/17: .5857m/s with SPC; .71 m/s; 03/20/2017: .9858m /s    Time  8    Period  Weeks    Status  On-going      PT LONG TERM GOAL #5   Title  Patient will improve 5XSTS to under 16sec to demonstrate significant improvement in LE functional strength and decreased fall risk    Baseline  42sec; 02/26/17: 5xSTS: 21sec; 03/20/17: 19sec    Time  8    Period  Weeks    Status  On-going            Plan - 03/25/17 1352    Clinical Impression Statement  Patient demonstrates improved arm swing with ambulation requiring less cueing with  performance. Although patient is improving, she has decreased L foot clearance with ambulation requiring cueing to improve. Patient will benefit from further skilled therapy to return to prior level of function.     Rehab Potential  Good    Clinical Impairments Affecting Rehab Potential  (+) Highly motivated, family support (-) age, diabetes    PT Frequency  2x / week    PT Duration  8 weeks    PT Treatment/Interventions  Gait training;Moist Heat;Therapeutic activities;Therapeutic exercise;Balance training;Patient/family education;Neuromuscular re-education;Stair training;Functional mobility training;Passive range of motion;Manual techniques;Electrical Stimulation    PT Next Visit Plan  Progress strengthening    PT Home Exercise Plan  See education section    Consulted and Agree with Plan of Care  Patient       Patient will benefit from skilled therapeutic intervention in order to improve the following deficits and impairments:  Abnormal gait, Decreased endurance, Decreased strength, Decreased balance, Difficulty walking, Decreased mobility  Visit Diagnosis: Muscle weakness (generalized)  Other lack of coordination  Other symptoms and signs involving the nervous system  Difficulty in walking, not elsewhere classified     Problem List Patient Active Problem List   Diagnosis Date Noted  . DM (diabetes mellitus), 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  . Essential hypertension 11/01/2016  . Mixed hyperlipidemia 11/01/2016  . Memory change 11/01/2016  . History of stroke 10/01/2016    Myrene GalasWesley Laylanie Kruczek, PT DPT 03/25/2017, 1:56 PM   [facility] [address] [place], [place], [address] Phone: [phone]   Fax:  [phone]  Name: Crystal MasseLinda [place] MRN: [id number] Date of Birth: 09/21/1945

## 2017-03-26 NOTE — Therapy (Signed)
[facility] [address] , [place], [address] Phone: [phone]   Fax:  [phone]  Occupational Therapy Treatment  Patient Details  Name: Crystal [place] MRN: [id number] Date of Birth: 01/15/1946 Referring Provider: Dr Birdie SonsSonnenberg   Encounter Date: 03/25/2017  OT End of Session - 03/25/17 1341    Visit Number  3    Number of Visits  16    Date for OT Re-Evaluation  04/11/17    OT Start Time  1346    OT Stop Time  1433    OT Time Calculation (min)  47 min    Activity Tolerance  Patient tolerated treatment well    Behavior During Therapy  [facility] for tasks assessed/performed       Past Medical History:  Diagnosis Date  . DM (diabetes mellitus), 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  . Essential hypertension 11/01/2016  . Mixed hyperlipidemia 11/01/2016  . Stroke [facility](HCC)     Past Surgical History:  Procedure Laterality Date  . CHOLECYSTECTOMY      There were no vitals filed for this visit.  Subjective Assessment - 03/25/17 1341    Subjective   I could reach up in cabinet to put bowl away with my L arm - doing better - cook hamburger helper - did my exercises - my grandson helped me     Patient Stated Goals  Increased strength and motion L arm and hand    Currently in Pain?  No/denies       Supine - done stabilization at 90 degrees   3 x 30 sec all directions - need mod v/c and t/c Supine - retraction and protraction 1 lbs weight - 2 x 10 reps   needed mod A to keep positiong and not bend elbow   Shoulder flexion 1 lbs over head  - done bilateral  And shoulder reaching to R hip and over head and out - took weight away - pt report some elbow pain with this one  10 reps  Mod A to not bend elbow - cause some pain in elbow- do straight arm - and work towards target - decrease coordination   RTB for retraction of scapula And shoulder extention  Bilateral 12 reps  Done at home - to cont with  Done  1 kg  ball on wall - for stabilization - pt had hard time with coordination to move palm on ball - pt to keep pressure and tap in all directions arm  3 x 30 sec at home with basketball     [facility] - golf ball - for thumb <> fingers - from palm inbetween - need still min A  Then golf ball thumb <> fingers - but do 4thand 5th separate and 2ndand 3rd  12 reps  - to cont at home with  Small object pick up - 4 - and hold in palm - out to finger tips again - 18 sec for 4 - done great - will time next time - do not have to do at home  Could not do tripot on pen and walk up and down - work on pick up pen and work in hand to tripod grip  - to cont with  Add rolling towel with L hand - thumb <> fingers  Patty cake - snap, clap, slap thigh, clap and snap  Also done with clap to OT hands inbetween - pt to do with grandson at home  8 x  done   UBE for 6 min - change directions every minute   pt report felt good - was harder going back wards                     OT Education - 03/25/17 1341    Education provided  Yes    Person(s) Educated  Patient    Methods  Explanation;Demonstration;Tactile cues;Verbal cues;Handout    Comprehension  Verbalized understanding;Returned demonstration;Verbal cues required;Need further instruction       OT Short Term Goals - 03/14/17 1300      OT SHORT TERM GOAL #1   Title  Pt will be Mod I HEP     Time  4    Period  Weeks    Status  New    Target Date  04/11/17      OT SHORT TERM GOAL #2   Title  Pt will demonstrate imporved FM control as seen by improved time on modified [employer] peg test (by 3 seconds or more).    Baseline  see initial Eval 03/14/17.    Time  4    Period  Weeks    Status  New      OT SHORT TERM GOAL #3   Title  Pt will report Mod I ADL's (for opening pill containers and during grooming tasks) using L hand/UE as non-dominant hand    Time  4    Period  Weeks    Status  New      OT SHORT TERM GOAL #4   Title  Pt will I'ly  state signs/symptoms of possible CVA    Time  4    Period  Weeks    Status  New        OT Long Term Goals - 03/14/17 1305      OT LONG TERM GOAL #1   Title  Pt will be Mod I styling her hair using LUE as assist and a/ PRN    Time  8    Period  Weeks    Status  New    Target Date  05/09/17      OT LONG TERM GOAL #2   Title  Pt will be I upgraded HEP LUE    Time  8    Period  Weeks    Status  New      OT LONG TERM GOAL #3   Title  Pt will report improved functiona lactivity as observed by her ability to participate in simulated ADL tasks for or more w/o rest break    Time  8    Period  Weeks    Status  New      OT LONG TERM GOAL #4   Title  Pt will demonstrate improved strength as seen by improved grip strength by 5# or more LUE    Baseline  See initial Eval 03/14/17    Time  8    Period  Weeks    Status  New      OT LONG TERM GOAL #5   Title  Pt will be I reaching into an overhead cabinet for a plate or bowl demonstrating good technique (No LOB) and overall strength bilateral UE's    Time  8    Period  Weeks    Status  New      Long Term Additional Goals   Additional Long Term Goals  Yes      OT LONG TERM GOAL #6  Title  Pt will I'ly state 2-3 energy conservation techniques & possible a/e for increased independence, and implement during therapy sessions    Time  8    Period  Weeks    Status  New            Plan - 03/25/17 1341    Clinical Impression Statement  Pt tolerating ther ex - tone and rigid movement with shoulder ROM - focusing on stabilation and strengthening around scapula and shoulder - cont with dexterity     Occupational performance deficits (Please refer to evaluation for details):  ADL's;IADL's;Leisure    Rehab Potential  Fair    Current Impairments/barriers affecting progress:  CVA 10/04/16; somewhat sedentary lifestyle    OT Frequency  1x / week    OT Duration  6 weeks    OT Treatment/Interventions  Self-care/ADL training;Energy  conservation;Therapeutic exercise;Therapeutic exercises;Patient/family education;Therapeutic activities;Neuromuscular education;Other (comment)    Plan  assess progress and upgrade HEP as needed     Clinical Decision Making  Several treatment options, min-mod task modification necessary    OT Home Exercise Plan  See pt instructions    Consulted and Agree with Plan of Care  Patient       Patient will benefit from skilled therapeutic intervention in order to improve the following deficits and impairments:  Decreased activity tolerance, Decreased endurance, Decreased range of motion, Impaired UE functional use, Decreased coordination, Decreased balance  Visit Diagnosis: Muscle weakness (generalized)  Other lack of coordination    Problem List Patient Active Problem List   Diagnosis Date Noted  . DM (diabetes mellitus), 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  . Essential hypertension 11/01/2016  . Mixed hyperlipidemia 11/01/2016  . Memory change 11/01/2016  . History of stroke 10/01/2016    Oletta CohnuPreez, Mosi Hannold OTR/L,CLT 03/26/2017, 9:35 AM  [facility] [address] [place], [place], [address] Phone: [phone]   Fax:  [phone]  Name: Crystal [place] MRN: [id number] Date of Birth: 11/14/1945

## 2017-03-27 ENCOUNTER — Ambulatory Visit: Payer: Medicare Other | Admitting: Occupational Therapy

## 2017-03-27 ENCOUNTER — Ambulatory Visit: Payer: Medicare Other

## 2017-03-27 DIAGNOSIS — M6281 Muscle weakness (generalized): Secondary | ICD-10-CM

## 2017-03-27 DIAGNOSIS — R278 Other lack of coordination: Secondary | ICD-10-CM

## 2017-03-27 DIAGNOSIS — R262 Difficulty in walking, not elsewhere classified: Secondary | ICD-10-CM

## 2017-03-27 DIAGNOSIS — I639 Cerebral infarction, unspecified: Secondary | ICD-10-CM

## 2017-03-27 DIAGNOSIS — R29818 Other symptoms and signs involving the nervous system: Secondary | ICD-10-CM

## 2017-03-27 DIAGNOSIS — I6350 Cerebral infarction due to unspecified occlusion or stenosis of unspecified cerebral artery: Secondary | ICD-10-CM | POA: Insufficient documentation

## 2017-03-27 HISTORY — DX: Cerebral infarction due to unspecified occlusion or stenosis of unspecified cerebral artery: I63.50

## 2017-03-27 HISTORY — DX: Cerebral infarction, unspecified: I63.9

## 2017-03-27 NOTE — Therapy (Signed)
[facility] [address] [place], [place], [address] Phone: [phone]   Fax:  [phone]  Physical Therapy Treatment  Patient Details  Name: Crystal [place] MRN: [id number] Date of Birth: 06/09/1945 Referring Provider: Everlene OtherJayce Cook DO   Encounter Date: 03/27/2017  PT End of Session - 03/27/17 1329    Visit Number  22    Number of Visits  38    Date for PT Re-Evaluation  05/08/17    Authorization Type  2/ 10 G Code    PT Start Time  1300    PT Stop Time  1345    PT Time Calculation (min)  45 min    Activity Tolerance  Patient tolerated treatment well    Behavior During Therapy  [facility] for tasks assessed/performed       Past Medical History:  Diagnosis Date  . DM (diabetes mellitus), 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  . Essential hypertension 11/01/2016  . Mixed hyperlipidemia 11/01/2016  . Stroke [facility](HCC)     Past Surgical History:  Procedure Laterality Date  . CHOLECYSTECTOMY      There were no vitals filed for this visit.  Subjective Assessment - 03/27/17 1319    Subjective  Patient reports she wasn't able to walk outside this weekend because it was raining outside. Patient report's she's feeling lazy today.    Pertinent History  PMH: Diabetes type II, HTN, Osteoporosis, CVA on 10/04/16    Limitations  Lifting;Standing;Walking    How long can you stand comfortably?  1 min    How long can you walk comfortably?  30min     Diagnostic tests  MRI: Positive    Patient Stated Goals  Not to be dependent with walker    Currently in Pain?  No/denies       Therapeutic Exercise Nustep level 5 - x 5 min with cueing on speed of performance with focus on improving speed  Hip Abduction in standing - x 20; with yellow band - x 20  Side stepping up and over 6" step - x 20 Squats with UE support - x 25  Ambulation without UE support -- 31300ft and focus on speed and arm swing   Patient responds well to therapy with  increased LE fatigue at end of session   PT Education - 03/27/17 1329    Education provided  Yes    Education Details  form/technique with exercise    Person(s) Educated  Patient    Methods  Explanation;Demonstration    Comprehension  Verbalized understanding;Returned demonstration          PT Long Term Goals - 03/20/17 1407      PT LONG TERM GOAL #1   Title  Patient will be independent with HEP to continue benefits of therapy after discharge    Baseline  Dependent with exercise performance and progression    Time  8    Period  Weeks    Status  On-going      PT LONG TERM GOAL #2   Title  Patient will improve TUG to <20sec to demonstrate significant improvement in fall risk.     Baseline  TUG: 35secs; 10/9.18: 26sec; 03/20/17: 22 sec     Time  8    Period  Weeks    Status  On-going      PT LONG TERM GOAL #3   Title  Patient will improve 6minWT to >105300ft improve LE endurance and improve ability to  walk throughout the grocery store.     Baseline  6minWT: 52750ft; 02/26/2017: 75400ft 03/20/17: 77325ft    Time  8    Period  Weeks    Status  On-going      PT LONG TERM GOAL #4   Title  Patient will improve 10mWT scores to >1.0 m/s to demonstrate improvement with community ambulation without a RW.    Baseline  .18108m/s with rollator; 02/26/17: .68108m/s with SPC; .71 m/s; 03/20/2017: .5163m /s    Time  8    Period  Weeks    Status  On-going      PT LONG TERM GOAL #5   Title  Patient will improve 5XSTS to under 16sec to demonstrate significant improvement in LE functional strength and decreased fall risk    Baseline  42sec; 02/26/17: 5xSTS: 21sec; 03/20/17: 19sec    Time  8    Period  Weeks    Status  On-going            Plan - 03/27/17 1339    Clinical Impression Statement  Patient making progress towards long term goals with improvement in walking speed and balance once in standing. Patient demonstrates earlier onset of fatigued and patient not as motivated to perform therapy  today. Patient continues to demonstrate decreased balance in standing and will benefit from further skilled therapy to return to prior level of function.     Rehab Potential  Good    Clinical Impairments Affecting Rehab Potential  (+) Highly motivated, family support (-) age, diabetes    PT Frequency  2x / week    PT Duration  8 weeks    PT Treatment/Interventions  Gait training;Moist Heat;Therapeutic activities;Therapeutic exercise;Balance training;Patient/family education;Neuromuscular re-education;Stair training;Functional mobility training;Passive range of motion;Manual techniques;Electrical Stimulation    PT Next Visit Plan  Progress strengthening    PT Home Exercise Plan  See education section    Consulted and Agree with Plan of Care  Patient       Patient will benefit from skilled therapeutic intervention in order to improve the following deficits and impairments:  Abnormal gait, Decreased endurance, Decreased strength, Decreased balance, Difficulty walking, Decreased mobility  Visit Diagnosis: Muscle weakness (generalized)  Other lack of coordination  Other symptoms and signs involving the nervous system  Difficulty in walking, not elsewhere classified     Problem List Patient Active Problem List   Diagnosis Date Noted  . DM (diabetes mellitus), 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  . Essential hypertension 11/01/2016  . Mixed hyperlipidemia 11/01/2016  . Memory change 11/01/2016  . History of stroke 10/01/2016    Myrene GalasWesley Shardae Kleinman, PT DPT 03/27/2017, 1:45 PM  [facility] [address] [place], [place], [address] Phone: [phone]   Fax:  [phone]  Name: Crystal [place] MRN: [id number] Date of Birth: 11/02/1945

## 2017-03-27 NOTE — Therapy (Signed)
[place] [facility] [address], [place], [address] Phone: [phone]   Fax:  [phone]  Occupational Therapy Treatment  Patient Details  Name: Crystal Miller MRN: [id number] Date of Birth: 1946/03/23 Referring Provider: Dr Birdie Sons   Encounter Date: 03/27/2017  OT End of Session - 03/27/17 1353    Visit Number  4    Number of Visits  16    Date for OT Re-Evaluation  04/11/17    Authorization - Visit Number  2    Authorization - Number of Visits  10    OT Start Time  1346    OT Stop Time  1432    OT Time Calculation (min)  46 min    Activity Tolerance  Patient tolerated treatment well    Behavior During Therapy  [facility] for tasks assessed/performed       Past Medical History:  Diagnosis Date  . DM (diabetes mellitus), 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  . Essential hypertension 11/01/2016  . Mixed hyperlipidemia 11/01/2016  . Stroke [facility])     Past Surgical History:  Procedure Laterality Date  . CHOLECYSTECTOMY      There were no vitals filed for this visit.  Subjective Assessment - 03/27/17 1351    Subjective   I do not feel good today - no energy - I cook some yesterday     Patient Stated Goals  Increased strength and motion L arm and hand    Currently in Pain?  No/denies         Supine -2 kg ball - over head with bilateral hands- 12 reps D1 -D2  patterns - 2 kg ball  In supine = using bilateral hands   12 reps  Elbow extention against gravity - 2 kg ball -  Bilateral hands   RTB for retraction of scapula And shoulder extention  Bilateral 12 reps  Done at home - to cont with  Done 1 kg  ball on wall - for stabilization - pt had hard time with coordination to move palm on ball - pt to keep pressure and tap in all directions arm  3 x 30 sec at home with basketball    In Quad position - walking FW and BW with arms Elevate arms into flexion - alternating ABD of arms L and R - weight  shift and alternate Tap arms to thighs - alternate arms - weight shift 15 reps each Tennis ball R <> L hand toss  Up and down throw and catch with L hand  Bounce and catch with L hand - 6 x 8 reps each   cannot do more than 4 without using body to help or dropping ball  Add to HEP  UBE for 6 min - change directions every minute   pt report felt good - was harder going back wards                       OT Education - 03/27/17 1352    Education provided  Yes    Education Details  use bilatearl 4 lbs at home and for over head ROM, tennis ball for coordination     Person(s) Educated  Patient    Methods  Explanation;Demonstration;Tactile cues;Verbal cues    Comprehension  Verbalized understanding;Need further instruction;Returned demonstration       OT Short Term Goals - 03/14/17 1300      OT SHORT TERM GOAL #1  Title  Pt will be Mod I HEP     Time  4    Period  Weeks    Status  New    Target Date  04/11/17      OT SHORT TERM GOAL #2   Title  Pt will demonstrate imporved FM control as seen by improved time on modified Purdue peg test (by 3 seconds or more).    Baseline  see initial Eval 03/14/17.    Time  4    Period  Weeks    Status  New      OT SHORT TERM GOAL #3   Title  Pt will report Mod I ADL's (for opening pill containers and during grooming tasks) using L hand/UE as non-dominant hand    Time  4    Period  Weeks    Status  New      OT SHORT TERM GOAL #4   Title  Pt will I'ly state signs/symptoms of possible CVA    Time  4    Period  Weeks    Status  New        OT Long Term Goals - 03/14/17 1305      OT LONG TERM GOAL #1   Title  Pt will be Mod I styling her hair using LUE as assist and a/ PRN    Time  8    Period  Weeks    Status  New    Target Date  05/09/17      OT LONG TERM GOAL #2   Title  Pt will be I upgraded HEP LUE    Time  8    Period  Weeks    Status  New      OT LONG TERM GOAL #3   Title  Pt will report improved  functiona lactivity as observed by her ability to participate in simulated ADL tasks for 35min or more w/o rest break    Time  8    Period  Weeks    Status  New      OT LONG TERM GOAL #4   Title  Pt will demonstrate improved strength as seen by improved grip strength by 5# or more LUE    Baseline  See initial Eval 03/14/17    Time  8    Period  Weeks    Status  New      OT LONG TERM GOAL #5   Title  Pt will be I reaching into an overhead cabinet for a plate or bowl demonstrating good technique (No LOB) and overall strength bilateral UE's    Time  8    Period  Weeks    Status  New      Long Term Additional Goals   Additional Long Term Goals  Yes      OT LONG TERM GOAL #6   Title  Pt will I'ly state 2-3 energy conservation techniques & possible a/e for increased independence, and implement during therapy sessions    Time  8    Period  Weeks    Status  New            Plan - 03/27/17 1353    Clinical Impression Statement  Pt cont to show decrease over head ROM and strength in L shoulder - and cont to adress coordation - was able to do weight bearing in quad position this date on mat    Occupational performance deficits (Please refer to evaluation for details):  ADL's;IADL's;Leisure    Rehab Potential  Fair    Current Impairments/barriers affecting progress:  CVA 10/04/16; somewhat sedentary lifestyle    OT Frequency  2x / week    OT Duration  6 weeks    OT Treatment/Interventions  Self-care/ADL training;Energy conservation;Therapeutic exercise;Therapeutic exercises;Patient/family education;Therapeutic activities;Neuromuscular education;Other (comment)    Clinical Decision Making  Several treatment options, min-mod task modification necessary    OT Home Exercise Plan  See pt instructions    Consulted and Agree with Plan of Care  Patient       Patient will benefit from skilled therapeutic intervention in order to improve the following deficits and impairments:  Decreased  activity tolerance, Decreased endurance, Decreased range of motion, Impaired UE functional use, Decreased coordination, Decreased balance  Visit Diagnosis: Muscle weakness (generalized)  Other lack of coordination    Problem List Patient Active Problem List   Diagnosis Date Noted  . DM (diabetes mellitus), 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  . Essential hypertension 11/01/2016  . Mixed hyperlipidemia 11/01/2016  . Memory change 11/01/2016  . History of stroke 10/01/2016    Oletta CohnuPreez, Danita Proud OTR/L,CLT 03/27/2017, 2:33 PM  [place] [facility] [address] [place], [place], [address] Phone: [phone]   Fax:  [phone]  Name: Crystal MasseLinda Miller MRN: [id number] Date of Birth: 06/05/1945

## 2017-03-27 NOTE — Patient Instructions (Addendum)
Supine -4 lbs  - over head with bilateral hands- 12 reps D1 -D2  patterns - 4 lbs   In supine = using bilateral hands   12 reps  Tennis ball R <> L hand toss  Up and down throw and catch with L hand  Bounce and catch with L hand - 6 x 8 reps each   Add to HEP

## 2017-04-01 ENCOUNTER — Ambulatory Visit: Payer: Medicare Other

## 2017-04-01 ENCOUNTER — Ambulatory Visit: Payer: Medicare Other | Admitting: Occupational Therapy

## 2017-04-04 ENCOUNTER — Ambulatory Visit: Payer: Medicare Other

## 2017-04-04 ENCOUNTER — Ambulatory Visit: Payer: Medicare Other | Admitting: Occupational Therapy

## 2017-04-10 ENCOUNTER — Other Ambulatory Visit: Payer: Self-pay | Admitting: Family Medicine

## 2017-04-10 ENCOUNTER — Ambulatory Visit: Payer: Medicare Other | Admitting: Occupational Therapy

## 2017-04-15 ENCOUNTER — Encounter: Payer: Medicare Other | Admitting: Occupational Therapy

## 2017-04-17 ENCOUNTER — Ambulatory Visit: Payer: Medicare Other

## 2017-04-17 ENCOUNTER — Ambulatory Visit: Payer: Medicare Other | Admitting: Occupational Therapy

## 2017-04-17 DIAGNOSIS — R29818 Other symptoms and signs involving the nervous system: Secondary | ICD-10-CM

## 2017-04-17 DIAGNOSIS — M6281 Muscle weakness (generalized): Secondary | ICD-10-CM | POA: Diagnosis not present

## 2017-04-17 DIAGNOSIS — R278 Other lack of coordination: Secondary | ICD-10-CM

## 2017-04-17 DIAGNOSIS — R262 Difficulty in walking, not elsewhere classified: Secondary | ICD-10-CM

## 2017-04-17 NOTE — Therapy (Signed)
[place] [facility] [address] [place], [address] Phone: [phone]   Fax:  [phone]  Occupational Therapy Treatment/discharge  Patient Details  Name: Crystal Miller MRN: [id number] Date of Birth: [birth year] Referring Provider: Dr Birdie Sons   Encounter Date: 04/17/2017  OT End of Session - 04/17/17 1430    Visit Number  5    Number of Visits  5    Date for OT Re-Evaluation  04/17/17    OT Start Time  1336    OT Stop Time  1418    OT Time Calculation (min)  42 min    Activity Tolerance  Patient tolerated treatment well    Behavior During Therapy  [facility] for tasks assessed/performed       Past Medical History:  Diagnosis Date  . DM (diabetes mellitus), 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  . Essential hypertension 11/01/2016  . Mixed hyperlipidemia 11/01/2016  . Stroke [facility])     Past Surgical History:  Procedure Laterality Date  . CHOLECYSTECTOMY      There were no vitals filed for this visit.  Subjective Assessment - 04/17/17 1427    Subjective   My arm is so much stronger , I can reach up into cupboards , pull shirt over head and fasten bra - can carry half gallon cool-aid or milk, put in my earrings today , tie my shoes, cooking I am back to 80% - only little biit of numbness still in tips of my fingers     Patient Stated Goals  Increased strength and motion L arm and hand    Currently in Pain?  No/denies         [facility] OT Assessment - 04/17/17 0001      Strength   Right/Left Shoulder  --    Left Shoulder Flexion  4/5    Left Shoulder ABduction  3+/5    Left Elbow Flexion  5/5    Left Elbow Extension  5/5    Left Wrist Flexion  5/5    Left Wrist Extension  5/5    Left Wrist Radial Deviation  5/5    Left Wrist Ulnar Deviation  5/5    Right Hand Grip (lbs)  44    Right Hand Lateral Pinch  10 lbs    Right Hand 3 Point Pinch  9 lbs    Left Hand Grip (lbs)  41    Left Hand Lateral Pinch   9 lbs    Left Hand 3 Point Pinch  8 lbs       Measurements taken for ROM and m/m for L shoulder ,elbow and wrist  PRWHE done for function - improved from 20/50 to 3.5/50  No pain  Still some decrease sensation in tips of L fingers   FMC improved - 5 disk stack ( modify [place] hand test)  - 5 pegs   was L and R hand 11 sec  But in hand manipuation - still decrease  R hand 10 sec, L 17 sec - from flipping 5 disks ( [place] hand test disks used and modified)  R hand 15 sec , L 24 sec - purdue 5 pegs manipulation   Review HEP with pt on cont shoulder strengthening HEP and hand function - pt feel she can cont with it at home - she is much stronger and able to use her L hand in ADL's and IADl's compare to 4 wks ago  OT Education - 04/17/17 1429    Education provided  Yes    Education Details  review HEP pt to work on in Scientist, physiologicalhand manipulation , and shoulder strengthening HEP     Person(s) Educated  Patient    Methods  Explanation;Demonstration    Comprehension  Verbalized understanding;Returned demonstration;Verbal cues required       OT Short Term Goals - 04/17/17 1433      OT SHORT TERM GOAL #1   Title  Pt will be Mod I HEP     Baseline  showed progress with doing HEP for last 2-3 wks while she was sick and out of town    Status  Achieved      OT SHORT TERM GOAL #2   Title  Pt will demonstrate imporved FM control as seen by improved time on modified Purdue peg test (by 3 seconds or more).    Baseline  modified [place] 11 sec R and L ;     Status  Achieved      OT SHORT TERM GOAL #3   Title  Pt will report Mod I ADL's (for opening pill containers and during grooming tasks) using L hand/UE as non-dominant hand    Status  Achieved      OT SHORT TERM GOAL #4   Title  Pt will I'ly state signs/symptoms of possible CVA    Status  Achieved        OT Long Term Goals - 04/17/17 1436      OT LONG TERM GOAL #1   Title  Pt will be Mod I styling her hair  using LUE as assist and a/ PRN    Status  Achieved      OT LONG TERM GOAL #2   Title  Pt will be I upgraded HEP LUE    Status  Achieved      OT LONG TERM GOAL #3   Title  Pt will report improved functiona lactivity as observed by her ability to participate in simulated ADL tasks for 35min or more w/o rest break    Status  Achieved      OT LONG TERM GOAL #4   Title  Pt will demonstrate improved strength as seen by improved grip strength by 5# or more LUE    Baseline  R grip 44 lbs and L 41 lbs     Status  Achieved      OT LONG TERM GOAL #5   Title  Pt will be I reaching into an overhead cabinet for a plate or bowl demonstrating good technique (No LOB) and overall strength bilateral UE's    Status  Achieved      OT LONG TERM GOAL #6   Title  Pt will I'ly state 2-3 energy conservation techniques & possible a/e for increased independence, and implement during therapy sessions    Status  Achieved            Plan - 04/17/17 1431    Clinical Impression Statement  [facility] UE strength , shoulder flexion has more normal reaching pattern now - but still 4/5 and ABD 4-/5 - elbow and wrist in all planes 5/5 -  grip and prehension compare to R WNL -  FMC  improved greatly  and function score on PRWHE  but in hand manipulation still decrease compare to R - but pt feels like she can cont with HEP for  that and shoulder strengthening - discharge  at this time     OT Treatment/Interventions  Self-care/ADL training;Energy conservation;Therapeutic exercise;Therapeutic exercises;Patient/family education;Therapeutic activities;Neuromuscular education;Other (comment)    Plan  discharge with HEP     Clinical Decision Making  Limited treatment options, no task modification necessary    OT Home Exercise Plan  see pt instruction     Consulted and Agree with Plan of Care  Patient       Patient will benefit from skilled therapeutic intervention in order to improve the  following deficits and impairments:     Visit Diagnosis: Muscle weakness (generalized) - Plan: Ot plan of care cert/re-cert  Other lack of coordination - Plan: Ot plan of care cert/re-cert    Problem List Patient Active Problem List   Diagnosis Date Noted  . DM (diabetes mellitus), 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  . Essential hypertension 11/01/2016  . Mixed hyperlipidemia 11/01/2016  . Memory change 11/01/2016  . History of stroke 10/01/2016    Oletta CohnuPreez, Mccall Will OTR/L,CLT 04/17/2017, 2:43 PM  [place] [facility] [address] [place], [place], [address] Phone: [phone]   Fax:  [phone]  Name: Crystal MasseLinda Miller MRN: [id number] Date of Birth: 01/13/1946

## 2017-04-17 NOTE — Therapy (Signed)
[place] [facility] [address] [place], [place], [address] Phone: [phone]   Fax:  [phone]  Physical Therapy Treatment  Patient Details  Name: Crystal MasseLinda Miller MRN: [id number] Date of Birth: 10/28/1945 Referring Provider: Everlene OtherJayce Cook DO   Encounter Date: 04/17/2017  PT End of Session - 04/17/17 1433    Visit Number  23    Number of Visits  38    Date for PT Re-Evaluation  05/08/17    Authorization Type  3/ 10 G Code    PT Start Time  1430    PT Stop Time  1515    PT Time Calculation (min)  45 min    Activity Tolerance  Patient tolerated treatment well    Behavior During Therapy  [facility] for tasks assessed/performed       Past Medical History:  Diagnosis Date  . DM (diabetes mellitus), 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  . Essential hypertension 11/01/2016  . Mixed hyperlipidemia 11/01/2016  . Stroke [facility](HCC)     Past Surgical History:  Procedure Laterality Date  . CHOLECYSTECTOMY      There were no vitals filed for this visit.  Subjective Assessment - 04/17/17 1431    Subjective  Patient reports she's been walking at home and with her family. Patient reports her legs have been feeling a bit better since she started walking more.     Pertinent History  PMH: Diabetes type II, HTN, Osteoporosis, CVA on 10/04/16    Limitations  Lifting;Standing;Walking    How long can you stand comfortably?  1 min    How long can you walk comfortably?  30min     Diagnostic tests  MRI: Positive for CVA    Patient Stated Goals  Not to be dependent with walker    Currently in Pain?  No/denies       Therapeutic Exercise Nustep level 5 - x 5 min with cueing on speed of performance with focus on improving speed  Ambulation without UE support - x35100ft, x17100ft, x27500ft  and focus on speed, heel strike, stride length and arm swing Step over Half foam roller - 2 x 10 with weight shifting forward Step ups with 6" step - 2 x  10 Patient demonstrates increased fatigue at end of the session    PT Education - 04/17/17 1433    Education provided  Yes    Education Details  Educated to continue walking program    Person(s) Educated  Patient    Methods  Explanation;Demonstration    Comprehension  Verbalized understanding;Returned demonstration          PT Long Term Goals - 03/27/17 1346      PT LONG TERM GOAL #1   Title  Patient will be independent with HEP to continue benefits of therapy after discharge    Baseline  Dependent with exercise performance and progression    Time  8    Period  Weeks    Status  On-going      PT LONG TERM GOAL #2   Title  Patient will improve TUG to <20sec to demonstrate significant improvement in fall risk.     Baseline  TUG: 35secs; 10/9.18: 26sec; 03/20/17: 22 sec     Time  8    Period  Weeks    Status  On-going      PT LONG TERM GOAL #3   Title  Patient will improve 6minWT to >104500ft improve LE endurance and improve  ability to walk throughout the grocery store.     Baseline  6minWT: 53350ft; 02/26/2017: 71800ft 03/20/17: 75225ft    Time  8    Period  Weeks    Status  On-going      PT LONG TERM GOAL #4   Title  Patient will improve 10mWT scores to >1.0 m/s to demonstrate improvement with community ambulation without a RW.    Baseline  .2732m/s with rollator; 02/26/17: .7432m/s with SPC; .71 m/s; 03/20/2017: .3140m /s    Time  8    Period  Weeks    Status  On-going      PT LONG TERM GOAL #5   Title  Patient will improve 5XSTS to under 16sec to demonstrate significant improvement in LE functional strength and decreased fall risk    Baseline  42sec; 02/26/17: 5xSTS: 21sec; 03/20/17: 19sec    Time  8    Period  Weeks    Status  On-going            Plan - 04/17/17 1449    Clinical Impression Statement  Patient demonstrates decreased foot clearance on the L side with decreased push off and heel strike B. Patient demonstrates early onset of fatigue but demonstrates  improvement in UE swing with gait pattern indicating functional carryover between session. Patient will benefit from further skilled therapy to return to prior level of function and decrease fall risk.     Rehab Potential  Good    Clinical Impairments Affecting Rehab Potential  (+) Highly motivated, family support (-) age, diabetes    PT Frequency  2x / week    PT Duration  8 weeks    PT Treatment/Interventions  Gait training;Moist Heat;Therapeutic activities;Therapeutic exercise;Balance training;Patient/family education;Neuromuscular re-education;Stair training;Functional mobility training;Passive range of motion;Manual techniques;Electrical Stimulation    PT Next Visit Plan  Progress strengthening    PT Home Exercise Plan  See education section    Consulted and Agree with Plan of Care  Patient       Patient will benefit from skilled therapeutic intervention in order to improve the following deficits and impairments:  Abnormal gait, Decreased endurance, Decreased strength, Decreased balance, Difficulty walking, Decreased mobility  Visit Diagnosis: Muscle weakness (generalized)  Other lack of coordination  Other symptoms and signs involving the nervous system  Difficulty in walking, not elsewhere classified     Problem List Patient Active Problem List   Diagnosis Date Noted  . DM (diabetes mellitus), 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  . Essential hypertension 11/01/2016  . Mixed hyperlipidemia 11/01/2016  . Memory change 11/01/2016  . History of stroke 10/01/2016    Myrene GalasWesley Jenayah Antu, PT DPT 04/17/2017, 3:06 PM  [place] [facility] [address] [place], [place], [address] Phone: [phone]   Fax:  [phone]  Name: Crystal MasseLinda Miller MRN: [id number] Date of Birth: 03/05/1946

## 2017-04-22 ENCOUNTER — Ambulatory Visit: Payer: Medicare Other

## 2017-04-22 ENCOUNTER — Ambulatory Visit: Payer: Medicare Other | Admitting: Occupational Therapy

## 2017-04-23 ENCOUNTER — Ambulatory Visit: Payer: Medicare Other | Attending: Surgery

## 2017-04-23 DIAGNOSIS — R262 Difficulty in walking, not elsewhere classified: Secondary | ICD-10-CM | POA: Insufficient documentation

## 2017-04-23 DIAGNOSIS — M6281 Muscle weakness (generalized): Secondary | ICD-10-CM | POA: Insufficient documentation

## 2017-04-23 DIAGNOSIS — R278 Other lack of coordination: Secondary | ICD-10-CM | POA: Insufficient documentation

## 2017-04-23 DIAGNOSIS — R29818 Other symptoms and signs involving the nervous system: Secondary | ICD-10-CM | POA: Diagnosis present

## 2017-04-23 NOTE — Therapy (Addendum)
[facility]IC[facility] [address] [place], [place], [address] Phone: [phone]   Fax:  [phone]  Physical Therapy Treatment  Patient Details  Name: Crystal Miller Lonzo MRN: [id number] Date of Birth: 08/21/1945 Referring Provider: Everlene OtherJayce Cook DO   Encounter Date: 04/23/2017  PT End of Session - 04/23/17 1440    Visit Number  24    Number of Visits  38    Date for PT Re-Evaluation  05/08/17    Authorization Type  4/ 10 G Code    PT Start Time  1433    PT Stop Time  1513    PT Time Calculation (min)  40 min    Equipment Utilized During Treatment  Gait belt    Activity Tolerance  Patient tolerated treatment well    Behavior During Therapy  WFL for tasks assessed/performed       Past Medical History:  Diagnosis Date  . DM (diabetes mellitus), 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  . Essential hypertension 11/01/2016  . Mixed hyperlipidemia 11/01/2016  . Stroke [facility](HCC)     Past Surgical History:  Procedure Laterality Date  . CHOLECYSTECTOMY      There were no vitals filed for this visit.  Subjective Assessment - 04/23/17 1439    Subjective  Pt reports she is feeling well today. her HEP continues to go well. She can notice improvements since working with PT. Pt says she is still walking alot inside d/t weather, but ventures outside occasionally.     Pertinent History  PMH: Diabetes type II, HTN, Osteoporosis, CVA on 10/04/16    Currently in Pain?  No/denies       INTERVENTION THIS SESSION   Therapeutic Exercise -Nu-step level 5 - x 5 min with cueing on speed of performance with focus on improving speed  -STS from chair + air-ex: 2x10 good form overall -Step up and over, lateral 1x12 bilat, 4" step single UE support  -Fwd step up and over, 1x12 bilat, 4" step single UE support  -Lateral sidestepping MinGuard Assist: 4x1540ft  -RetroAMB, VC for step length: 2x7040ft -Fwd high knee marching: 2x6440ft MinGuard Assist  -Airex  Trunk rotation: 15x bilat -AMB: 14200ft s A/E, VC for heel/toe gait; lateral instability intermittinetly, Mod-I for balance recovery.     PT Long Term Goals - 03/27/17 1346      PT LONG TERM GOAL #1   Title  Patient will be independent with HEP to continue benefits of therapy after discharge    Baseline  Dependent with exercise performance and progression    Time  8    Period  Weeks    Status  On-going      PT LONG TERM GOAL #2   Title  Patient will improve TUG to <20sec to demonstrate significant improvement in fall risk.     Baseline  TUG: 35secs; 10/9.18: 26sec; 03/20/17: 22 sec     Time  8    Period  Weeks    Status  On-going      PT LONG TERM GOAL #3   Title  Patient will improve 6minWT to >107200ft improve LE endurance and improve ability to walk throughout the grocery store.     Baseline  6minWT: 51450ft; 02/26/2017: 73600ft 03/20/17: 76125ft    Time  8    Period  Weeks    Status  On-going      PT LONG TERM GOAL #4   Title  Patient will improve 10mWT scores to >  1.0 m/s to demonstrate improvement with community ambulation without a RW.    Baseline  .5430m/s with rollator; 02/26/17: .3630m/s with SPC; .71 m/s; 03/20/2017: .8561m /s    Time  8    Period  Weeks    Status  On-going      PT LONG TERM GOAL #5   Title  Patient will improve 5XSTS to under 16sec to demonstrate significant improvement in LE functional strength and decreased fall risk    Baseline  42sec; 02/26/17: 5xSTS: 21sec; 03/20/17: 19sec    Time  8    Period  Weeks    Status  On-going            Plan - 04/23/17 1440    Clinical Impression Statement  Continued to progress dynamic stability in functional movements, tranfers and gait, and progressed dynamic balance activity. Pt tolerating well overall, but somewaht fatigued at end of session. Pt still making good progress in quality and tolerance to AMB.     Rehab Potential  Good    Clinical Impairments Affecting Rehab Potential  (+) Highly motivated, family support  (-) age, diabetes    PT Frequency  2x / week    PT Duration  8 weeks    PT Treatment/Interventions  Gait training;Moist Heat;Therapeutic activities;Therapeutic exercise;Balance training;Patient/family education;Neuromuscular re-education;Stair training;Functional mobility training;Passive range of motion;Manual techniques;Electrical Stimulation    PT Next Visit Plan  Progress strengthening, dynamic balance     PT Home Exercise Plan  See education section       Patient will benefit from skilled therapeutic intervention in order to improve the following deficits and impairments:  Abnormal gait, Decreased endurance, Decreased strength, Decreased balance, Difficulty walking, Decreased mobility  Visit Diagnosis: Muscle weakness (generalized)  Other lack of coordination  Other symptoms and signs involving the nervous system  Difficulty in walking, not elsewhere classified     Problem List Patient Active Problem List   Diagnosis Date Noted  . DM (diabetes mellitus), 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  . Essential hypertension 11/01/2016  . Mixed hyperlipidemia 11/01/2016  . Memory change 11/01/2016  . History of stroke 10/01/2016   2:59 PM, 04/23/17 Rosamaria LintsAllan C Leodan Bolyard, PT, DPT [facility] - [place] [phone] (Office)   Torin Whisner C 04/23/2017, 2:58 PM  [place] [facility]IC[facility] [address] [place], [place], [address] Phone: [phone]   Fax:  [phone]  Name: Crystal Miller Montellano MRN: [id number] Date of Birth: 07/01/1945

## 2017-04-24 ENCOUNTER — Ambulatory Visit: Payer: Medicare Other

## 2017-04-24 ENCOUNTER — Ambulatory Visit: Payer: Medicare Other | Admitting: Occupational Therapy

## 2017-04-25 ENCOUNTER — Ambulatory Visit: Payer: Medicare Other

## 2017-04-25 DIAGNOSIS — R278 Other lack of coordination: Secondary | ICD-10-CM

## 2017-04-25 DIAGNOSIS — R29818 Other symptoms and signs involving the nervous system: Secondary | ICD-10-CM

## 2017-04-25 DIAGNOSIS — M6281 Muscle weakness (generalized): Secondary | ICD-10-CM

## 2017-04-25 DIAGNOSIS — R262 Difficulty in walking, not elsewhere classified: Secondary | ICD-10-CM

## 2017-04-25 NOTE — Therapy (Signed)
[facility] [address] [place], [place], [address] Phone: [phone]   Fax:  [phone]  Physical Therapy Treatment  Patient Details  Name: Crystal Miller MRN: [id number] Date of Birth: 02/04/1946 Referring Provider: Everlene OtherJayce Cook DO   Encounter Date: 04/25/2017  PT End of Session - 04/25/17 1525    Visit Number  25    Number of Visits  38    Date for PT Re-Evaluation  05/08/17    Authorization Type  5/ 10 G Code    PT Start Time  1515    PT Stop Time  1600    PT Time Calculation (min)  45 min    Equipment Utilized During Treatment  Gait belt    Activity Tolerance  Patient tolerated treatment well    Behavior During Therapy  WFL for tasks assessed/performed       Past Medical History:  Diagnosis Date  . DM (diabetes mellitus), 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  . Essential hypertension 11/01/2016  . Mixed hyperlipidemia 11/01/2016  . Stroke [facility](HCC)     Past Surgical History:  Procedure Laterality Date  . CHOLECYSTECTOMY      There were no vitals filed for this visit.  Subjective Assessment - 04/25/17 1522    Subjective  Patient reports she's been "doing goo" in terms of ambulating. Patient reports she's has not been walking often secondary to not having assistance when walking.     Pertinent History  PMH: Diabetes type II, HTN, Osteoporosis, CVA on 10/04/16    Currently in Pain?  No/denies    Multiple Pain Sites  No       Therapeutic Exercise Nustep level 6 - x 6.5 min with cueing on speed of performance with focus on improving speed  Ambulation without UE support -x28900ft, x 16100ft  and focus on speed, heel strike, stride length and arm swing Backwards ambulation without use of UE - 2 x 730ft Side stepping - 3230ft with UE support  Stepping forward with agility ladder - x 6 each length ~2410ft each length Step ups onto 9" step with UE support - x 10   Patient demonstrates increased fatigue at end of  the session   PT Education - 04/25/17 1525    Education provided  Yes    Education Details  form/technique with exercise    Person(s) Educated  Patient    Methods  Explanation;Demonstration    Comprehension  Verbalized understanding;Returned demonstration          PT Long Term Goals - 03/27/17 1346      PT LONG TERM GOAL #1   Title  Patient will be independent with HEP to continue benefits of therapy after discharge    Baseline  Dependent with exercise performance and progression    Time  8    Period  Weeks    Status  On-going      PT LONG TERM GOAL #2   Title  Patient will improve TUG to <20sec to demonstrate significant improvement in fall risk.     Baseline  TUG: 35secs; 10/9.18: 26sec; 03/20/17: 22 sec     Time  8    Period  Weeks    Status  On-going      PT LONG TERM GOAL #3   Title  Patient will improve 6minWT to >106800ft improve LE endurance and improve ability to walk throughout the grocery store.     Baseline  6minWT: 57350ft; 02/26/2017: 76800ft 03/20/17:  3025ft    Time  8    Period  Weeks    Status  On-going      PT LONG TERM GOAL #4   Title  Patient will improve 10mWT scores to >1.0 m/s to demonstrate improvement with community ambulation without a RW.    Baseline  .3062m/s with rollator; 02/26/17: .8262m/s with SPC; .71 m/s; 03/20/2017: .7324m /s    Time  8    Period  Weeks    Status  On-going      PT LONG TERM GOAL #5   Title  Patient will improve 5XSTS to under 16sec to demonstrate significant improvement in LE functional strength and decreased fall risk    Baseline  42sec; 02/26/17: 5xSTS: 21sec; 03/20/17: 19sec    Time  8    Period  Weeks    Status  On-going            Plan - 04/25/17 1532    Clinical Impression Statement  Continued to address ambulation ability and ability to lift feet when ambulating. Patient demonstrates improved speed with walking today and required less cues to improve walking ability. Although patient is improving, she demonstrates  increased fall risk in terms LE strength and ambulation speed. Patient will benefit from further skilled therapy to return to prior level of function.     Rehab Potential  Good    Clinical Impairments Affecting Rehab Potential  (+) Highly motivated, family support (-) age, diabetes    PT Frequency  2x / week    PT Duration  8 weeks    PT Treatment/Interventions  Gait training;Moist Heat;Therapeutic activities;Therapeutic exercise;Balance training;Patient/family education;Neuromuscular re-education;Stair training;Functional mobility training;Passive range of motion;Manual techniques;Electrical Stimulation    PT Next Visit Plan  Progress strengthening, dynamic balance     PT Home Exercise Plan  See education section       Patient will benefit from skilled therapeutic intervention in order to improve the following deficits and impairments:  Abnormal gait, Decreased endurance, Decreased strength, Decreased balance, Difficulty walking, Decreased mobility  Visit Diagnosis: Muscle weakness (generalized)  Other lack of coordination  Other symptoms and signs involving the nervous system  Difficulty in walking, not elsewhere classified     Problem List Patient Active Problem List   Diagnosis Date Noted  . DM (diabetes mellitus), 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  . Essential hypertension 11/01/2016  . Mixed hyperlipidemia 11/01/2016  . Memory change 11/01/2016  . History of stroke 10/01/2016    Myrene GalasWesley Briseyda Fehr, PT DPT 04/25/2017, 3:49 PM  [facility] [address] [place], [place], [address] Phone: [phone]   Fax:  [phone]  Name: Crystal Miller MRN: [id number] Date of Birth: 01/12/1946

## 2017-04-30 ENCOUNTER — Ambulatory Visit: Payer: Medicare Other

## 2017-04-30 ENCOUNTER — Ambulatory Visit: Payer: Medicare Other | Admitting: Family Medicine

## 2017-05-02 ENCOUNTER — Ambulatory Visit: Payer: Medicare Other

## 2017-05-06 ENCOUNTER — Ambulatory Visit: Payer: Medicare Other

## 2017-05-06 DIAGNOSIS — R278 Other lack of coordination: Secondary | ICD-10-CM

## 2017-05-06 DIAGNOSIS — M6281 Muscle weakness (generalized): Secondary | ICD-10-CM | POA: Diagnosis not present

## 2017-05-06 NOTE — Therapy (Signed)
[place] [facility] [address] [place], [place], [address] Phone: [phone]   Fax:  [phone]  Physical Therapy Treatment  Patient Details  Name: Crystal Miller MRN: [id number] Date of Birth: 08/18/1945 Referring Provider: Everlene OtherJayce Cook DO   Encounter Date: 05/06/2017  PT End of Session - 05/06/17 1551    Visit Number  26    Number of Visits  38    Date for PT Re-Evaluation  05/08/17    Authorization Type  6/ 10 G Code    PT Start Time  1515    PT Stop Time  1600    PT Time Calculation (min)  45 min    Equipment Utilized During Treatment  Gait belt    Activity Tolerance  Patient tolerated treatment well    Behavior During Therapy  WFL for tasks assessed/performed       Past Medical History:  Diagnosis Date  . DM (diabetes mellitus), 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  . Essential hypertension 11/01/2016  . Mixed hyperlipidemia 11/01/2016  . Stroke [facility](HCC)     Past Surgical History:  Procedure Laterality Date  . CHOLECYSTECTOMY      There were no vitals filed for this visit.  Subjective Assessment - 05/06/17 1540    Subjective  Patient reports she has not been able to walk much seoncdary to the snow. Patient reports she feels comfortable in the house when walking. Patient reports she was able to stand from her knees yesterday.     Pertinent History  PMH: Diabetes type II, HTN, Osteoporosis, CVA on 10/04/16    Limitations  Lifting;Standing;Walking    Diagnostic tests  MRI: Positive for CVA    Patient Stated Goals  Not to be dependent with walker    Currently in Pain?  No/denies         Therapeutic Exercise Nustep level 7 - x 6.5 min with cueing on speed of performance with focus on improving speed  Ambulation without UE support -x28650ft, x200 and focus on speed, heel strike, stride length and arm swing Step ups up and over step - x 8 without UE support without UE support Foot taps onto 3" step -  x15 Heel taps onto 3" step - x 15   Patient demonstrates increased fatigue at end of the session     PT Education - 05/06/17 1546    Education provided  Yes    Education Details  form/technique with form/technique     Person(s) Educated  Patient    Methods  Explanation;Demonstration    Comprehension  Verbalized understanding;Returned demonstration          PT Long Term Goals - 03/27/17 1346      PT LONG TERM GOAL #1   Title  Patient will be independent with HEP to continue benefits of therapy after discharge    Baseline  Dependent with exercise performance and progression    Time  8    Period  Weeks    Status  On-going      PT LONG TERM GOAL #2   Title  Patient will improve TUG to <20sec to demonstrate significant improvement in fall risk.     Baseline  TUG: 35secs; 10/9.18: 26sec; 03/20/17: 22 sec     Time  8    Period  Weeks    Status  On-going      PT LONG TERM GOAL #3   Title  Patient will improve 6minWT to >107700ft improve  LE endurance and improve ability to walk throughout the grocery store.     Baseline  6minWT: 5850ft; 02/26/2017: 74200ft 03/20/17: 72225ft    Time  8    Period  Weeks    Status  On-going      PT LONG TERM GOAL #4   Title  Patient will improve 10mWT scores to >1.0 m/s to demonstrate improvement with community ambulation without a RW.    Baseline  .8617m/s with rollator; 02/26/17: .6717m/s with SPC; .71 m/s; 03/20/2017: .418m /s    Time  8    Period  Weeks    Status  On-going      PT LONG TERM GOAL #5   Title  Patient will improve 5XSTS to under 16sec to demonstrate significant improvement in LE functional strength and decreased fall risk    Baseline  42sec; 02/26/17: 5xSTS: 21sec; 03/20/17: 19sec    Time  8    Period  Weeks    Status  On-going            Plan - 05/06/17 1614    Clinical Impression Statement  Focused on improving heel strike and balance in standing positions. Patient requires frequent verbal cueing to improve heel strike and  demonstrates increased difficulty with performing heel strike on the R vs the L. Patient demonstrates decreased muscular endurance as noted by increased fatigue and breathlessness after ambulating ~25150ft. Patient will benefit from further skilled therapy to return to prior level of function.     Rehab Potential  Good    Clinical Impairments Affecting Rehab Potential  (+) Highly motivated, family support (-) age, diabetes    PT Frequency  2x / week    PT Duration  8 weeks    PT Treatment/Interventions  Gait training;Moist Heat;Therapeutic activities;Therapeutic exercise;Balance training;Patient/family education;Neuromuscular re-education;Stair training;Functional mobility training;Passive range of motion;Manual techniques;Electrical Stimulation    PT Next Visit Plan  Progress strengthening, dynamic balance     PT Home Exercise Plan  See education section       Patient will benefit from skilled therapeutic intervention in order to improve the following deficits and impairments:  Abnormal gait, Decreased endurance, Decreased strength, Decreased balance, Difficulty walking, Decreased mobility  Visit Diagnosis: Muscle weakness (generalized)  Other lack of coordination     Problem List Patient Active Problem List   Diagnosis Date Noted  . DM (diabetes mellitus), 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  . Essential hypertension 11/01/2016  . Mixed hyperlipidemia 11/01/2016  . Memory change 11/01/2016  . History of stroke 10/01/2016    Myrene GalasWesley Romaine Maciolek, PT DPT 05/06/2017, 4:19 PM  [place] [facility] PH[facility]TS MEDICINE [address] [place], [place], [address] Phone: [phone]   Fax:  [phone]  Name: Crystal Miller MRN: [id number] Date of Birth: 09/08/1945

## 2017-05-08 ENCOUNTER — Ambulatory Visit: Payer: Medicare Other

## 2017-05-08 DIAGNOSIS — M6281 Muscle weakness (generalized): Secondary | ICD-10-CM

## 2017-05-08 DIAGNOSIS — R278 Other lack of coordination: Secondary | ICD-10-CM

## 2017-05-08 DIAGNOSIS — R29818 Other symptoms and signs involving the nervous system: Secondary | ICD-10-CM

## 2017-05-08 NOTE — Therapy (Signed)
[place] [facility] [address] [place], [place], [address] Phone: [phone]   Fax:  [phone]  Physical Therapy Treatment  Patient Details  Name: Crystal Miller MRN: [id number] Date of Birth: 09/14/1945 Referring Provider: Everlene OtherJayce Cook DO   Encounter Date: 05/08/2017  PT End of Session - 05/08/17 1552    Visit Number  27    Number of Visits  46    Date for PT Re-Evaluation  05/08/17    Authorization Type  7/ 10 G Code (next visit 1/10)    PT Start Time  1445    PT Stop Time  1530    PT Time Calculation (min)  45 min    Equipment Utilized During Treatment  Gait belt    Activity Tolerance  Patient tolerated treatment well    Behavior During Therapy  [facility] for tasks assessed/performed       Past Medical History:  Diagnosis Date  . DM (diabetes mellitus), 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  . Essential hypertension 11/01/2016  . Mixed hyperlipidemia 11/01/2016  . Stroke [facility]HCC)     Past Surgical History:  Procedure Laterality Date  . CHOLECYSTECTOMY      There were no vitals filed for this visit.  Subjective Assessment - 05/08/17 1527    Subjective  Patient reports she has been walking around her house. Patient states she's making progress with physical therapy and will benefit from future therapy.     Pertinent History  PMH: Diabetes type II, HTN, Osteoporosis, CVA on 10/04/16    Limitations  Lifting;Standing;Walking    Diagnostic tests  MRI: Positive for CVA    Patient Stated Goals  Not to be dependent with walker    Currently in Pain?  No/denies        Therapeutic Exercise Nustep level 7 - x 7 min with cueing on speed of performance with focus on improving speed  Ambulation without UE support -x45900ft, x88400ft and focus on speed, heel strike, stride length and arm swing (last 8700ft performed with rollator) Sit to stands - x 5, x10 for speed without use of UEs Standing walks and sit downs - x 5 with  rollator and without rollator Walking for speed - x 5 with focus on increased speed performed without rollator and with rollator   Patient demonstrates increased fatigue at end of the session   PT Education - 05/08/17 1531    Education provided  Yes    Education Details  form/technique with exercise    Person(s) Educated  Patient    Methods  Explanation;Demonstration    Comprehension  Verbalized understanding;Returned demonstration          PT Long Term Goals - 05/08/17 1510      PT LONG TERM GOAL #1   Title  Patient will be independent with HEP to continue benefits of therapy after discharge    Baseline  Dependent with exercise performance and progression    Time  8    Period  Weeks    Status  On-going      PT LONG TERM GOAL #2   Title  Patient will improve TUG to <20sec to demonstrate significant improvement in fall risk.     Baseline  TUG: 35secs; 10/9.18: 26sec; 03/20/17: 22 sec; 05/08/2017: 17sec with rollator    Time  8    Period  Weeks    Status  Achieved      PT LONG TERM GOAL #3   Title  Patient will improve 6minWT to >10200ft improve LE endurance and improve ability to walk throughout the grocery store.     Baseline  6minWT: 5250ft; 02/26/2017: 75700ft 03/20/17: 75825ft; 05/08/2017: 8910ft    Time  8    Period  Weeks    Status  On-going      PT LONG TERM GOAL #4   Title  Patient will improve 10mWT scores to >1.0 m/s to demonstrate improvement with community ambulation without a RW.    Baseline  .5271m/s with rollator; 02/26/17: .5171m/s with SPC; .71 m/s; 03/20/2017: .5768m /s; 05/08/2017: .84    Time  8    Period  Weeks    Status  On-going      PT LONG TERM GOAL #5   Title  Patient will improve 5XSTS to under 16sec to demonstrate significant improvement in LE functional strength and decreased fall risk    Baseline  42sec; 02/26/17: 5xSTS: 21sec; 03/20/17: 19sec; 05/08/17: 16.3sec    Time  8    Period  Weeks    Status  On-going            Plan - 05/08/17 1558     Clinical Impression Statement  Patient demonstrates improvement in gait speed, 5xSTS, 6mWT and TUG scores indicating functional improvement in LE strength, and endurance as well as decreased fall risk. Although patient is improving, she continues to demonstrates poor stepping pattern with patient 'dragging' her right foot while progressing her foot to take a step. Patient will benefit from further skilled therapy to decrease fall risk.     Rehab Potential  Good    Clinical Impairments Affecting Rehab Potential  (+) Highly motivated, family support (-) age, diabetes    PT Frequency  2x / week    PT Duration  8 weeks    PT Treatment/Interventions  Gait training;Moist Heat;Therapeutic activities;Therapeutic exercise;Balance training;Patient/family education;Neuromuscular re-education;Stair training;Functional mobility training;Passive range of motion;Manual techniques;Electrical Stimulation    PT Next Visit Plan  Progress strengthening, dynamic balance     PT Home Exercise Plan  See education section       Patient will benefit from skilled therapeutic intervention in order to improve the following deficits and impairments:  Abnormal gait, Decreased endurance, Decreased strength, Decreased balance, Difficulty walking, Decreased mobility  Visit Diagnosis: Muscle weakness (generalized)  Other lack of coordination  Other symptoms and signs involving the nervous system   G-Codes - 05/08/17 1601    Functional Assessment Tool Used (Outpatient Only)  5XSTS, 10mWT, 6minWT, TUG, clinical judgement    Functional Limitation  Mobility: Walking and moving around    Mobility: Walking and Moving Around Current Status [phone])  At least 20 percent but less than 40 percent impaired, limited or restricted    Mobility: Walking and Moving Around Goal Status [phone]([id number])  At least 1 percent but less than 20 percent impaired, limited or restricted       Problem List Patient Active Problem List   Diagnosis Date  Noted  . DM (diabetes mellitus), 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  . Essential hypertension 11/01/2016  . Mixed hyperlipidemia 11/01/2016  . Memory change 11/01/2016  . History of stroke 10/01/2016    Myrene GalasWesley Taja Pentland, PT DPT 05/08/2017, 4:01 PM  [place] [facility] [address] [place], [place], [address] Phone: [phone]   Fax:  [phone]  Name: Crystal Miller MRN: [id number] Date of Birth: 01/24/1946

## 2017-05-20 ENCOUNTER — Ambulatory Visit: Payer: Medicare Other

## 2017-05-20 ENCOUNTER — Telehealth: Payer: Self-pay

## 2017-05-20 NOTE — Telephone Encounter (Signed)
Copied from CRM [id number]. Topic: Appointment Scheduling - Scheduling Inquiry for Clinic >> May 20, 2017  9:53 AM Crystal Miller, Crystal Miller: Reason for CRM: pt called b/c she was told that she would receive a phone call to reschedule her appt that was on the 11th of Dec but never received a phone call, she wants to reschedule her appt but Dr. Purvis SheffieldSonnenberg's availability is too far out, contact pt to schedule an appt

## 2017-05-22 ENCOUNTER — Ambulatory Visit: Payer: Medicare HMO | Attending: Family Medicine

## 2017-05-22 DIAGNOSIS — R278 Other lack of coordination: Secondary | ICD-10-CM | POA: Diagnosis not present

## 2017-05-22 DIAGNOSIS — R262 Difficulty in walking, not elsewhere classified: Secondary | ICD-10-CM | POA: Insufficient documentation

## 2017-05-22 DIAGNOSIS — M6281 Muscle weakness (generalized): Secondary | ICD-10-CM | POA: Insufficient documentation

## 2017-05-22 NOTE — Therapy (Signed)
[place] [facility] PH[facility]ICINE [address] [place], [place], [address] Phone: [phone]   Fax:  [phone]  Physical Therapy Treatment  Patient Details  Name: Crystal MasseLinda Miller MRN: [id number] Date of Birth: 10/27/1945 Referring Provider: Everlene OtherJayce Cook DO   Encounter Date: 05/22/2017  PT End of Session - 05/22/17 1528    Visit Number  28    Number of Visits  46    Date for PT Re-Evaluation  06/19/17    PT Start Time  1515    PT Stop Time  1600    PT Time Calculation (min)  45 min    Equipment Utilized During Treatment  Gait belt    Activity Tolerance  Patient tolerated treatment well    Behavior During Therapy  [facility] for tasks assessed/performed       Past Medical History:  Diagnosis Date  . DM (diabetes mellitus), 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  . Essential hypertension 11/01/2016  . Mixed hyperlipidemia 11/01/2016  . Stroke [facility](HCC)     Past Surgical History:  Procedure Laterality Date  . CHOLECYSTECTOMY      There were no vitals filed for this visit.  Subjective Assessment - 05/22/17 1525    Subjective  Patient reports no major changes since the previous session and patient reports she went walking this weekend do to the favorable weather.     Pertinent History  PMH: Diabetes type II, HTN, Osteoporosis, CVA on 10/04/16    Limitations  Lifting;Standing;Walking    Diagnostic tests  MRI: Positive for CVA    Patient Stated Goals  Not to be dependent with walker    Currently in Pain?  No/denies       Therapeutic Exercise Nustep level 5 - x 10 min with cueing on speed of performance with focus on improving speed  Sit to stands - 2 x10 for speed without use of UEs Ambulation without UE support -x68200ft and focus on speed, heel strike, stride length and arm swing (last 85300ft performed with rollator) Stepping over two quad canes - x 6 down and back without UE support Ambulation with quad cane - x11400ft with increased speed  compared to without AD    Patient demonstrates increased fatigue at end of the session   PT Education - 05/22/17 1526    Education provided  Yes    Education Details  form/technique with exercises     Person(s) Educated  Patient    Methods  Explanation;Demonstration    Comprehension  Verbalized understanding;Returned demonstration          PT Long Term Goals - 05/08/17 1510      PT LONG TERM GOAL #1   Title  Patient will be independent with HEP to continue benefits of therapy after discharge    Baseline  Dependent with exercise performance and progression    Time  8    Period  Weeks    Status  On-going      PT LONG TERM GOAL #2   Title  Patient will improve TUG to <20sec to demonstrate significant improvement in fall risk.     Baseline  TUG: 35secs; 10/9.18: 26sec; 03/20/17: 22 sec; 05/08/2017: 17sec with rollator    Time  8    Period  Weeks    Status  Achieved      PT LONG TERM GOAL #3   Title  Patient will improve 6minWT to >104900ft improve LE endurance and improve ability to walk throughout the grocery  store.     Baseline  : 525ft; 02/26/2017: 715ft 03/20/17: 728ft; 05/08/2017: 856ft    Time  8    Period  Weeks    Status  On-going      PT LONG TERM GOAL #4   Title  Patient will improve scores to >1.0 m/s to demonstrate improvement with community ambulation without a RW.    Baseline  .60m/s with rollator; 02/26/17: .80m/s with SPC; .71 m/s; 03/20/2017: .26m /s; 05/08/2017: .84    Time  8    Period  Weeks    Status  On-going      PT LONG TERM GOAL #5   Title  Patient will improve 5XSTS to under 16sec to demonstrate significant improvement in LE functional strength and decreased fall risk    Baseline  42sec; 02/26/17: 5xSTS: 21sec; 03/20/17: 19sec; 05/08/17: 16.3sec    Time  8    Period  Weeks    Status  On-going            Plan - 05/22/17 1553    Clinical Impression Statement  Patient demonstrates decreased endurance with ambulation without a AD  compared to use of a rollator. Patient demonstrates decreased strength with performance of sit to stands requiring multiple attempts to stand into the later repetitions. Patient will benefit from further skilled therapy to return to prior level of function.      Rehab Potential  Good    Clinical Impairments Affecting Rehab Potential  (+) Highly motivated, family support (-) age, diabetes    PT Frequency  2x / week    PT Duration  8 weeks    PT Treatment/Interventions  Gait training;Moist Heat;Therapeutic activities;Therapeutic exercise;Balance training;Patient/family education;Neuromuscular re-education;Stair training;Functional mobility training;Passive range of motion;Manual techniques;Electrical Stimulation    PT Next Visit Plan  Progress strengthening, dynamic balance     PT Home Exercise Plan  See education section       Patient will benefit from skilled therapeutic intervention in order to improve the following deficits and impairments:  Abnormal gait, Decreased endurance, Decreased strength, Decreased balance, Difficulty walking, Decreased mobility  Visit Diagnosis: Other lack of coordination  Muscle weakness (generalized)  Difficulty in walking, not elsewhere classified     Problem List Patient Active Problem List   Diagnosis Date Noted  . DM (diabetes mellitus), 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  . Essential hypertension 11/01/2016  . Mixed hyperlipidemia 11/01/2016  . Memory change 11/01/2016  . History of stroke 10/01/2016    Myrene Galas, PT DPT 05/22/2017, 4:17 PM  [place] [facility] PHYSICAL AND SPORTS MEDICINE [address] [place], [address] Phone: [phone]   Fax:  [phone]  Name: Crystal Miller MRN: [id number] Date of Birth: 07-Dec-1945

## 2017-05-27 ENCOUNTER — Ambulatory Visit: Payer: Medicare HMO

## 2017-05-27 DIAGNOSIS — R262 Difficulty in walking, not elsewhere classified: Secondary | ICD-10-CM

## 2017-05-27 DIAGNOSIS — R278 Other lack of coordination: Secondary | ICD-10-CM

## 2017-05-27 DIAGNOSIS — M6281 Muscle weakness (generalized): Secondary | ICD-10-CM | POA: Diagnosis not present

## 2017-05-27 NOTE — Therapy (Signed)
[place] [facility] [address] [place], [place], [address] Phone: [phone]   Fax:  [phone]  Physical Therapy Treatment  Patient Details  Name: Crystal Miller MRN: [id number] Date of Birth: 11/02/1945 Referring Provider: Everlene OtherJayce Cook DO   Encounter Date: 05/27/2017  PT End of Session - 05/27/17 1407    Visit Number  29    Number of Visits  46    Date for PT Re-Evaluation  06/19/17    PT Start Time  1345    PT Stop Time  1430    PT Time Calculation (min)  45 min    Equipment Utilized During Treatment  Gait belt    Activity Tolerance  Patient tolerated treatment well    Behavior During Therapy  [facility] for tasks assessed/performed       Past Medical History:  Diagnosis Date  . DM (diabetes mellitus), 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  . Essential hypertension 11/01/2016  . Mixed hyperlipidemia 11/01/2016  . Stroke [facility](HCC)     Past Surgical History:  Procedure Laterality Date  . CHOLECYSTECTOMY      There were no vitals filed for this visit.  Subjective Assessment - 05/27/17 1403    Subjective  Patient reports no major changes since the previous session and reports she had not been performing exercises at home.     Pertinent History  PMH: Diabetes type II, HTN, Osteoporosis, CVA on 10/04/16    Limitations  Lifting;Standing;Walking    Diagnostic tests  MRI: Positive for CVA    Patient Stated Goals  Not to be dependent with walker    Currently in Pain?  No/denies       Therapeutic Exercise Nustep level 5 - x 7 min with cueing on speed of performance with focus on improving speed  Sit to stands - 2 x10 for speed without use of UEs with pillow on seat Ambulation without UE support -x2600ft and focus on speed, heel strike, stride length and arm swing  Step ups onto 6" step - 2 x 5 B  Side stepping up and over - 8 x 566ft to address balance and hip strengthening     Patient demonstrates increased fatigue at end  of the session    PT Education - 05/27/17 1406    Education provided  Yes    Education Details  form/technique with exercise    Person(s) Educated  Patient    Methods  Explanation;Demonstration    Comprehension  Verbalized understanding;Returned demonstration          PT Long Term Goals - 05/08/17 1510      PT LONG TERM GOAL #1   Title  Patient will be independent with HEP to continue benefits of therapy after discharge    Baseline  Dependent with exercise performance and progression    Time  8    Period  Weeks    Status  On-going      PT LONG TERM GOAL #2   Title  Patient will improve TUG to <20sec to demonstrate significant improvement in fall risk.     Baseline  TUG: 35secs; 10/9.18: 26sec; 03/20/17: 22 sec; 05/08/2017: 17sec with rollator    Time  8    Period  Weeks    Status  Achieved      PT LONG TERM GOAL #3   Title  Patient will improve 6minWT to >108800ft improve LE endurance and improve ability to walk throughout the grocery store.  Baseline  : 536ft; 02/26/2017: 786ft 03/20/17: 716ft; 05/08/2017: 832ft    Time  8    Period  Weeks    Status  On-going      PT LONG TERM GOAL #4   Title  Patient will improve scores to >1.0 m/s to demonstrate improvement with community ambulation without a RW.    Baseline  .87m/s with rollator; 02/26/17: .60m/s with SPC; .71 m/s; 03/20/2017: .23m /s; 05/08/2017: .84    Time  8    Period  Weeks    Status  On-going      PT LONG TERM GOAL #5   Title  Patient will improve 5XSTS to under 16sec to demonstrate significant improvement in LE functional strength and decreased fall risk    Baseline  42sec; 02/26/17: 5xSTS: 21sec; 03/20/17: 19sec; 05/08/17: 16.3sec    Time  8    Period  Weeks    Status  On-going            Plan - 05/27/17 1414    Clinical Impression Statement  Patient demonstrates increased greater fatigue today with exercises compared to previous visits. Patient reports she has not eaten anything  today and reports increased fatigue due to not eating. Continued to address increased weakness and balance and pt will benefit from further skilled therapy to return to prior level of function.     Rehab Potential  Good    Clinical Impairments Affecting Rehab Potential  (+) Highly motivated, family support (-) age, diabetes    PT Frequency  2x / week    PT Duration  8 weeks    PT Treatment/Interventions  Gait training;Moist Heat;Therapeutic activities;Therapeutic exercise;Balance training;Patient/family education;Neuromuscular re-education;Stair training;Functional mobility training;Passive range of motion;Manual techniques;Electrical Stimulation    PT Next Visit Plan  Progress strengthening, dynamic balance     PT Home Exercise Plan  See education section       Patient will benefit from skilled therapeutic intervention in order to improve the following deficits and impairments:  Abnormal gait, Decreased endurance, Decreased strength, Decreased balance, Difficulty walking, Decreased mobility  Visit Diagnosis: Muscle weakness (generalized)  Other lack of coordination  Difficulty in walking, not elsewhere classified     Problem List Patient Active Problem List   Diagnosis Date Noted  . DM (diabetes mellitus), 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  . Essential hypertension 11/01/2016  . Mixed hyperlipidemia 11/01/2016  . Memory change 11/01/2016  . History of stroke 10/01/2016    Myrene Galas, PT DPT 05/27/2017, 2:33 PM  [place] [facility] PHYSICAL AND SPORTS MEDICINE [address] [place], [address] Phone: [phone]   Fax:  [phone]  Name: Crystal Miller MRN: [id number] Date of Birth: 08-Jan-1946

## 2017-05-29 ENCOUNTER — Telehealth: Payer: Self-pay

## 2017-05-29 NOTE — Telephone Encounter (Signed)
Copied from CRM [id number]. Topic: General - Other >> May 29, 2017  2:55 PM Raquel SarnaHayes, Teresa G wrote: Pt returned missed call.

## 2017-05-30 ENCOUNTER — Ambulatory Visit: Payer: Medicare HMO

## 2017-05-30 DIAGNOSIS — R278 Other lack of coordination: Secondary | ICD-10-CM

## 2017-05-30 DIAGNOSIS — M6281 Muscle weakness (generalized): Secondary | ICD-10-CM

## 2017-05-30 DIAGNOSIS — R262 Difficulty in walking, not elsewhere classified: Secondary | ICD-10-CM

## 2017-05-30 NOTE — Telephone Encounter (Signed)
I do not see where anyone has called patient

## 2017-05-30 NOTE — Therapy (Signed)
[facility] [address], [place], [address] Phone: [phone]   Fax:  [phone]  Physical Therapy Treatment  Patient Details  Name: Crystal Miller MRN: [id number] Date of Birth: [birth year] Referring Provider: Everlene Other DO   Encounter Date: 05/30/2017  PT End of Session - 05/30/17 1436    Visit Number  30    Number of Visits  46    Date for PT Re-Evaluation  06/19/17    PT Start Time  1330    PT Stop Time  1415    PT Time Calculation (min)  45 min    Equipment Utilized During Treatment  Gait belt    Activity Tolerance  Patient tolerated treatment well    Behavior During Therapy  [facility] for tasks assessed/performed       Past Medical History:  Diagnosis Date  . DM (diabetes mellitus), 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  . Essential hypertension 11/01/2016  . Mixed hyperlipidemia 11/01/2016  . Stroke [facility])     Past Surgical History:  Procedure Laterality Date  . CHOLECYSTECTOMY      There were no vitals filed for this visit.  Subjective Assessment - 05/30/17 1344    Subjective  Patient reports there have been no major changes since previous session and that she has been performing her exercises at home.    Pertinent History  PMH: Diabetes type II, HTN, Osteoporosis, CVA on 10/04/16    Limitations  Lifting;Standing;Walking    How long can you stand comfortably?  1 min    How long can you walk comfortably?      Diagnostic tests  MRI: Positive for CVA    Patient Stated Goals  Not to be dependent with walker    Currently in Pain?  No/denies         Therapeutic Exercise:   Nustep level 5 - x 7 min with cueing on speed of performance with focus on improving speed Sit to stands -- x10, x12 for speed without use of UEs with pillow on seat Ambulation without UE support -351ft and focus on speed, heel strike, stride length and arm swing Step over 4" step forward and backward -2x5 B with  cueing and min assist to address balance Step ups onto 6" step - x10 B Hip abduction in standing with UE support - x15  Patient demonstrates increased fatigue at end of the session.                 PT Education - 05/30/17 1432    Education Details  Form/technique with exercise     Person(s) Educated  Patient    Methods  Explanation;Demonstration;Tactile cues;Verbal cues    Comprehension  Verbalized understanding;Returned demonstration          PT Long Term Goals - 05/08/17 1510      PT LONG TERM GOAL #1   Title  Patient will be independent with HEP to continue benefits of therapy after discharge    Baseline  Dependent with exercise performance and progression    Time  8    Period  Weeks    Status  On-going      PT LONG TERM GOAL #2   Title  Patient will improve TUG to <20sec to demonstrate significant improvement in fall risk.     Baseline  TUG: 35secs; 10/9.18: 26sec; 03/20/17: 22 sec; 05/08/2017: 17sec with rollator    Time  8    Period  Weeks    Status  Achieved      PT LONG TERM GOAL #3   Title  Patient will improve 6minWT to >107100ft improve LE endurance and improve ability to walk throughout the grocery store.     Baseline  6minWT: 52550ft; 02/26/2017: 79500ft 03/20/17: 72925ft; 05/08/2017: 85410ft    Time  8    Period  Weeks    Status  On-going      PT LONG TERM GOAL #4   Title  Patient will improve 10mWT scores to >1.0 m/s to demonstrate improvement with community ambulation without a RW.    Baseline  .4438m/s with rollator; 02/26/17: .938m/s with SPC; .71 m/s; 03/20/2017: .4866m /s; 05/08/2017: .84    Time  8    Period  Weeks    Status  On-going      PT LONG TERM GOAL #5   Title  Patient will improve 5XSTS to under 16sec to demonstrate significant improvement in LE functional strength and decreased fall risk    Baseline  42sec; 02/26/17: 5xSTS: 21sec; 03/20/17: 19sec; 05/08/17: 16.3sec    Time  8    Period  Weeks    Status  On-going            Plan  - 05/30/17 1446    Clinical Impression Statement  Patient demonstrates imrpovement in endurance with x10, x12STS with pillow.  Patient required increased cueing for step ups. Although patient increased endurance for step ups she continues to demonstrate dysfunctional dragging pattern of L foot to clear height of step.  Patient will benefit from further skilled therapy to decrease fall risk.    Rehab Potential  Good    Clinical Impairments Affecting Rehab Potential  (+) Highly motivated, family support (-) age, diabetes    PT Frequency  2x / week    PT Duration  8 weeks    PT Treatment/Interventions  Gait training;Moist Heat;Therapeutic activities;Therapeutic exercise;Balance training;Patient/family education;Neuromuscular re-education;Stair training;Functional mobility training;Passive range of motion;Manual techniques;Electrical Stimulation    PT Next Visit Plan  Progress strengthening, dynamic balance     PT Home Exercise Plan  See education section       Patient will benefit from skilled therapeutic intervention in order to improve the following deficits and impairments:  Abnormal gait, Decreased endurance, Decreased strength, Decreased balance, Difficulty walking, Decreased mobility  Visit Diagnosis: No diagnosis found.     Problem List Patient Active Problem List   Diagnosis Date Noted  . DM (diabetes mellitus), 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  . Essential hypertension 11/01/2016  . Mixed hyperlipidemia 11/01/2016  . Memory change 11/01/2016  . History of stroke 10/01/2016    Temple PaciniHaley Beau Vanduzer, SPT 05/30/2017, 3:15 PM  [place] [facility] [address] [place], [place], [address] Phone: [phone]   Fax:  [phone]  Name: Consuello MasseLinda Spackman MRN: [id number] Date of Birth: 11/05/1945

## 2017-06-03 ENCOUNTER — Ambulatory Visit: Payer: Medicare HMO

## 2017-06-07 ENCOUNTER — Other Ambulatory Visit: Payer: Self-pay | Admitting: Family Medicine

## 2017-06-07 NOTE — Telephone Encounter (Signed)
Last OV 03/06/17

## 2017-06-07 NOTE — Telephone Encounter (Signed)
Pt states she has been taking the BP medication that was recalled. It's called amLODipine (NORVASC) 5 MG tablet. Pt needs a refill on BP. Please advise? Refill for alendronate (FOSAMAX) 70 MG tablet, atorvastatin (LIPITOR) 40 MG tablet, lisinopril (PRINIVIL,ZESTRIL) 20 MG tablet, metFORMIN (GLUCOPHAGE) 500 MG tablet, sertraline (ZOLOFT) 100 MG tablet. Thank you!  [facility] [address] - [place], [place] - [id number]  Call pt @ [phone].

## 2017-06-08 MED ORDER — LISINOPRIL 20 MG PO TABS: 20.0000 mg | ORAL_TABLET | Freq: Every day | ORAL | 2 refills | Status: DC

## 2017-06-08 MED ORDER — ALENDRONATE SODIUM 70 MG PO TABS: 70.0000 mg | ORAL_TABLET | ORAL | 11 refills | Status: DC

## 2017-06-08 MED ORDER — SERTRALINE HCL 100 MG PO TABS: 100.0000 mg | ORAL_TABLET | Freq: Every day | ORAL | 1 refills | Status: DC

## 2017-06-08 MED ORDER — ATORVASTATIN CALCIUM 40 MG PO TABS
ORAL_TABLET | ORAL | 2 refills | Status: DC
Start: 2017-06-08 — End: 2017-06-10

## 2017-06-08 MED ORDER — METFORMIN HCL 500 MG PO TABS: 500.0000 mg | ORAL_TABLET | Freq: Two times a day (BID) | ORAL | 3 refills | Status: DC

## 2017-06-08 NOTE — Telephone Encounter (Signed)
Sent to pharmacy.  Please see what she takes the sertraline for.  Please also see if she checked with her pharmacy regarding the amlodipine.  I believe individual amlodipine tablets were not recalled and they were only recalled as part of combination tablets with valsartan.  She should check with her pharmacy regarding this.  Thanks.

## 2017-06-10 ENCOUNTER — Other Ambulatory Visit: Payer: Self-pay | Admitting: Family Medicine

## 2017-06-10 MED ORDER — ALENDRONATE SODIUM 70 MG PO TABS: 70.0000 mg | ORAL_TABLET | ORAL | 11 refills | Status: DC

## 2017-06-10 MED ORDER — SERTRALINE HCL 100 MG PO TABS: 100.0000 mg | ORAL_TABLET | Freq: Every day | ORAL | 1 refills | Status: DC

## 2017-06-10 MED ORDER — AMLODIPINE BESYLATE 5 MG PO TABS: 5.0000 mg | ORAL_TABLET | Freq: Every day | ORAL | 3 refills | Status: DC

## 2017-06-10 MED ORDER — METFORMIN HCL 500 MG PO TABS: 500.0000 mg | ORAL_TABLET | Freq: Two times a day (BID) | ORAL | 3 refills | Status: DC

## 2017-06-10 MED ORDER — ATORVASTATIN CALCIUM 40 MG PO TABS: ORAL_TABLET | ORAL | 2 refills | Status: DC

## 2017-06-10 MED ORDER — LISINOPRIL 20 MG PO TABS: 20.0000 mg | ORAL_TABLET | Freq: Every day | ORAL | 2 refills | Status: DC

## 2017-06-10 NOTE — Telephone Encounter (Signed)
Patients daughter states she takes sertraline for anxiety and she will check with her pharmacy

## 2017-06-11 ENCOUNTER — Ambulatory Visit: Payer: Medicare HMO

## 2017-06-11 DIAGNOSIS — R262 Difficulty in walking, not elsewhere classified: Secondary | ICD-10-CM | POA: Diagnosis not present

## 2017-06-11 DIAGNOSIS — R278 Other lack of coordination: Secondary | ICD-10-CM

## 2017-06-11 DIAGNOSIS — M6281 Muscle weakness (generalized): Secondary | ICD-10-CM

## 2017-06-11 NOTE — Therapy (Signed)
[place] [facility] [address] [place], [place], [address] Phone: [phone]   Fax:  [phone]  Physical Therapy Treatment  Patient Details  Name: Crystal Miller MRN: [id number] Date of Birth: 05/09/1946 Referring Provider: Everlene OtherJayce Cook DO   Encounter Date: 06/11/2017  PT End of Session - 06/11/17 1606    Visit Number  31    Number of Visits  46    Date for PT Re-Evaluation  06/19/17    PT Start Time  1555    PT Stop Time  1635    PT Time Calculation (min)  40 min    Equipment Utilized During Treatment  Gait belt    Activity Tolerance  Patient tolerated treatment well    Behavior During Therapy  [facility] for tasks assessed/performed       Past Medical History:  Diagnosis Date  . DM (diabetes mellitus), 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  . Essential hypertension 11/01/2016  . Mixed hyperlipidemia 11/01/2016  . Stroke [facility](HCC)     Past Surgical History:  Procedure Laterality Date  . CHOLECYSTECTOMY      There were no vitals filed for this visit.  Subjective Assessment - 06/11/17 1604    Subjective  Patient reportsno major changes since the previous visits. Patient reports she wishes she cold "get out more" to walk.     Pertinent History  PMH: Diabetes type II, HTN, Osteoporosis, CVA on 10/04/16    Limitations  Lifting;Standing;Walking    How long can you stand comfortably?  1 min    How long can you walk comfortably?  30min     Diagnostic tests  MRI: Positive for CVA    Patient Stated Goals  Not to be dependent with walker    Currently in Pain?  No/denies       TREATMENT  Therapeutic Exercise:    Nustep level 5 - x 7 min with cueing on speed of performance with focus on improving speed Sit to stands -- x10, x12, x10 for speed without use of UEs with pillow on seat Ambulation without UE support -- 4500ft and focus on speed, heel strike, stride length and arm swing Backwards ambulation - 2 x 330ft with  performing short steps and cueing  Ball toss with narrow BOS - x 20 in standing   Patient demonstrates increased fatigue at end of the session.     PT Education - 06/11/17 1606    Education provided  Yes    Education Details  form/technique with exercise    Person(s) Educated  Patient    Methods  Explanation;Demonstration    Comprehension  Verbalized understanding;Returned demonstration          PT Long Term Goals - 05/08/17 1510      PT LONG TERM GOAL #1   Title  Patient will be independent with HEP to continue benefits of therapy after discharge    Baseline  Dependent with exercise performance and progression    Time  8    Period  Weeks    Status  On-going      PT LONG TERM GOAL #2   Title  Patient will improve TUG to <20sec to demonstrate significant improvement in fall risk.     Baseline  TUG: 35secs; 10/9.18: 26sec; 03/20/17: 22 sec; 05/08/2017: 17sec with rollator    Time  8    Period  Weeks    Status  Achieved      PT LONG TERM GOAL #  3   Title  Patient will improve to >1063ft improve LE endurance and improve ability to walk throughout the grocery store.     Baseline  : 550ft; 02/26/2017: 748ft 03/20/17: 734ft; 05/08/2017: 860ft    Time  8    Period  Weeks    Status  On-going      PT LONG TERM GOAL #4   Title  Patient will improve scores to >1.0 m/s to demonstrate improvement with community ambulation without a RW.    Baseline  .47m/s with rollator; 02/26/17: .39m/s with SPC; .71 m/s; 03/20/2017: .75m /s; 05/08/2017: .84    Time  8    Period  Weeks    Status  On-going      PT LONG TERM GOAL #5   Title  Patient will improve 5XSTS to under 16sec to demonstrate significant improvement in LE functional strength and decreased fall risk    Baseline  42sec; 02/26/17: 5xSTS: 21sec; 03/20/17: 19sec; 05/08/17: 16.3sec    Time  8    Period  Weeks    Status  On-going            Plan - 06/11/17 1614    Clinical Impression Statement  Patient  demonstrates decreased ability to perform sit to stands without use of hands today indicating decreased carryover. When asked, patient reports she has not been performing her HEP and poor carryover is most likely from not performing HEP. Patient does demonstrates decreased step length on the left side and patient will benefit from further skilled therapy to return to prior level of function.     Rehab Potential  Good    Clinical Impairments Affecting Rehab Potential  (+) Highly motivated, family support (-) age, diabetes    PT Frequency  2x / week    PT Duration  8 weeks    PT Treatment/Interventions  Gait training;Moist Heat;Therapeutic activities;Therapeutic exercise;Balance training;Patient/family education;Neuromuscular re-education;Stair training;Functional mobility training;Passive range of motion;Manual techniques;Electrical Stimulation    PT Next Visit Plan  Progress strengthening, dynamic balance     PT Home Exercise Plan  See education section       Patient will benefit from skilled therapeutic intervention in order to improve the following deficits and impairments:  Abnormal gait, Decreased endurance, Decreased strength, Decreased balance, Difficulty walking, Decreased mobility  Visit Diagnosis: Muscle weakness (generalized)  Other lack of coordination  Difficulty in walking, not elsewhere classified     Problem List Patient Active Problem List   Diagnosis Date Noted  . DM (diabetes mellitus), 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  . Essential hypertension 11/01/2016  . Mixed hyperlipidemia 11/01/2016  . Memory change 11/01/2016  . History of stroke 10/01/2016    Myrene Galas, PT DPT 06/11/2017, 4:33 PM  [place] [facility] PHYSICAL AND SPORTS MEDICINE [address], [place], [address] Phone: [phone]   Fax:  [phone]  Name: Crystal Miller MRN: [id number] Date of Birth: December 01, 1945

## 2017-06-17 ENCOUNTER — Ambulatory Visit: Payer: Medicare HMO

## 2017-06-17 DIAGNOSIS — R262 Difficulty in walking, not elsewhere classified: Secondary | ICD-10-CM

## 2017-06-17 DIAGNOSIS — M6281 Muscle weakness (generalized): Secondary | ICD-10-CM | POA: Diagnosis not present

## 2017-06-17 DIAGNOSIS — R278 Other lack of coordination: Secondary | ICD-10-CM | POA: Diagnosis not present

## 2017-06-17 NOTE — Therapy (Signed)
[place] [facility] [address] [place], [address] Phone: [phone]   Fax:  [phone]  Physical Therapy Treatment  Patient Details  Name: Crystal Miller MRN: [id number] Date of Birth: 11-11-1945 Referring Provider: Everlene Other DO   Encounter Date: 06/17/2017  PT End of Session - 06/17/17 0934    Visit Number  32    Number of Visits  46    Date for PT Re-Evaluation  06/19/17    PT Start Time  0910    PT Stop Time  0948    PT Time Calculation (min)  38 min    Equipment Utilized During Treatment  Gait belt    Activity Tolerance  Patient tolerated treatment well    Behavior During Therapy  [facility] for tasks assessed/performed       Past Medical History:  Diagnosis Date  . DM (diabetes mellitus), 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  . Essential hypertension 11/01/2016  . Mixed hyperlipidemia 11/01/2016  . Stroke [facility])     Past Surgical History:  Procedure Laterality Date  . CHOLECYSTECTOMY      There were no vitals filed for this visit.  Subjective Assessment - 06/17/17 0917    Subjective  Patient reports no major changes since the previous session and reports no current pain.     Pertinent History  PMH: Diabetes type II, HTN, Osteoporosis, CVA on 10/04/16    Limitations  Lifting;Standing;Walking    How long can you stand comfortably?  1 min    How long can you walk comfortably?      Diagnostic tests  MRI: Positive for CVA    Patient Stated Goals  Not to be dependent with walker    Currently in Pain?  No/denies      TREATMENT  Therapeutic Exercise:    Nustep level 6 - x 5 min with cueing on speed of performance with focus on improving speed Ambulation with agility ladder - x6 with cueing on balance and ambulating with greater stride length Ambulation without UE support -- 322ft and focus on speed, heel strike, stride length and arm swing Side stepping across agility ladder - 2 x 10 Sit to  stands -- x13, x14 for speed without use of UEs with pillow on seat   Patient demonstrates increased fatigue at end of the session.    PT Education - 06/17/17 [phone]    Education provided  Yes    Education Details  form/technique with exercise    Person(s) Educated  Patient    Methods  Explanation;Demonstration    Comprehension  Verbalized understanding;Returned demonstration          PT Long Term Goals - 05/08/17 1510      PT LONG TERM GOAL #1   Title  Patient will be independent with HEP to continue benefits of therapy after discharge    Baseline  Dependent with exercise performance and progression    Time  8    Period  Weeks    Status  On-going      PT LONG TERM GOAL #2   Title  Patient will improve TUG to <20sec to demonstrate significant improvement in fall risk.     Baseline  TUG: 35secs; 10/9.18: 26sec; 03/20/17: 22 sec; 05/08/2017: 17sec with rollator    Time  8    Period  Weeks    Status  Achieved      PT LONG TERM GOAL #3   Title  Patient  will improve 6minWT to >108800ft improve LE endurance and improve ability to walk throughout the grocery store.     Baseline  6minWT: 51250ft; 02/26/2017: 76400ft 03/20/17: 79025ft; 05/08/2017: 86310ft    Time  8    Period  Weeks    Status  On-going      PT LONG TERM GOAL #4   Title  Patient will improve 10mWT scores to >1.0 m/s to demonstrate improvement with community ambulation without a RW.    Baseline  .5744m/s with rollator; 02/26/17: .8044m/s with SPC; .71 m/s; 03/20/2017: .2776m /s; 05/08/2017: .84    Time  8    Period  Weeks    Status  On-going      PT LONG TERM GOAL #5   Title  Patient will improve 5XSTS to under 16sec to demonstrate significant improvement in LE functional strength and decreased fall risk    Baseline  42sec; 02/26/17: 5xSTS: 21sec; 03/20/17: 19sec; 05/08/17: 16.3sec    Time  8    Period  Weeks    Status  On-going            Plan - 06/17/17 0947    Clinical Impression Statement  Patient demonstrates  decreased stride length bilaterally when ambulating indicating poor dynamic balance and stability with narrow BOS. Although patient demonstrates difficulty with ambulating she was able to perform greater amount of sit to stands indicating improvement in functional gait. Patient will benefit from further skilled therapy to return to prior level of function.     Rehab Potential  Good    Clinical Impairments Affecting Rehab Potential  (+) Highly motivated, family support (-) age, diabetes    PT Frequency  2x / week    PT Duration  8 weeks    PT Treatment/Interventions  Gait training;Moist Heat;Therapeutic activities;Therapeutic exercise;Balance training;Patient/family education;Neuromuscular re-education;Stair training;Functional mobility training;Passive range of motion;Manual techniques;Electrical Stimulation    PT Next Visit Plan  Progress strengthening, dynamic balance     PT Home Exercise Plan  See education section       Patient will benefit from skilled therapeutic intervention in order to improve the following deficits and impairments:  Abnormal gait, Decreased endurance, Decreased strength, Decreased balance, Difficulty walking, Decreased mobility  Visit Diagnosis: Muscle weakness (generalized)  Other lack of coordination  Difficulty in walking, not elsewhere classified     Problem List Patient Active Problem List   Diagnosis Date Noted  . DM (diabetes mellitus), 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  . Essential hypertension 11/01/2016  . Mixed hyperlipidemia 11/01/2016  . Memory change 11/01/2016  . History of stroke 10/01/2016    Myrene GalasWesley Seraj Dunnam, PT DPT 06/17/2017, 10:15 AM  [place] [facility] [address] [place], [place], [address] Phone: [phone]   Fax:  [phone]  Name: Crystal Miller MRN: [id number] Date of Birth: 05/04/1946

## 2017-06-19 ENCOUNTER — Ambulatory Visit: Payer: Medicare HMO

## 2017-06-19 DIAGNOSIS — M6281 Muscle weakness (generalized): Secondary | ICD-10-CM | POA: Diagnosis not present

## 2017-06-19 DIAGNOSIS — R262 Difficulty in walking, not elsewhere classified: Secondary | ICD-10-CM | POA: Diagnosis not present

## 2017-06-19 DIAGNOSIS — R278 Other lack of coordination: Secondary | ICD-10-CM | POA: Diagnosis not present

## 2017-06-19 NOTE — Therapy (Signed)
[place] [facility] [address], [place], [address] Phone: [phone]   Fax:  [phone]  Physical Therapy Treatment  Patient Details  Name: Crystal Miller MRN: [id number] Date of Birth: 07-27-1945 Referring Provider: Everlene Other DO   Encounter Date: 06/19/2017  PT End of Session - 06/19/17 1352    Visit Number  33    Number of Visits  46    Date for PT Re-Evaluation  06/19/17    PT Start Time  1300    PT Stop Time  1345    PT Time Calculation (min)  45 min    Equipment Utilized During Treatment  Gait belt    Activity Tolerance  Patient tolerated treatment well    Behavior During Therapy  [facility] for tasks assessed/performed       Past Medical History:  Diagnosis Date  . DM (diabetes mellitus), 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  . Essential hypertension 11/01/2016  . Mixed hyperlipidemia 11/01/2016  . Stroke [facility])     Past Surgical History:  Procedure Laterality Date  . CHOLECYSTECTOMY      There were no vitals filed for this visit.  Subjective Assessment - 06/19/17 1255    Subjective  Patient reports no current pain. Patient reports doing HEP.    Pertinent History  PMH: Diabetes type II, HTN, Osteoporosis, CVA on 10/04/16    Limitations  Lifting;Standing;Walking    How long can you stand comfortably?  1 min    How long can you walk comfortably?      Diagnostic tests  MRI: Positive for CVA    Patient Stated Goals  Not to be dependent with walker    Currently in Pain?  No/denies       TREATMENT   Therapeutic Exercise:    Nustep level 6 - x 6 min with focus on improving speed Standing hip extension -- 1 x20 B to increase endurance and strength of hip extensors Standing hip abduction -- 2x15 B to increase endurance and strength of hip abductors Lateral step ups onto 6" steps - to increase strength of LEs and glute muscles Sit to stands -- 2x13 for speed without use of UEs with pillow on  seat Ambulation without UE support -- 280ft with focus on speed, heel strike, stride length and arm swing   Patient demonstrates increase fatigue with exercise at end of session.      PT Education - 06/19/17 1352    Education provided  Yes    Education Details  Form/technique with exercise    Person(s) Educated  Patient    Methods  Explanation;Demonstration    Comprehension  Verbalized understanding;Returned demonstration          PT Long Term Goals - 05/08/17 1510      PT LONG TERM GOAL #1   Title  Patient will be independent with HEP to continue benefits of therapy after discharge    Baseline  Dependent with exercise performance and progression    Time  8    Period  Weeks    Status  On-going      PT LONG TERM GOAL #2   Title  Patient will improve TUG to <20sec to demonstrate significant improvement in fall risk.     Baseline  TUG: 35secs; 10/9.18: 26sec; 03/20/17: 22 sec; 05/08/2017: 17sec with rollator    Time  8    Period  Weeks    Status  Achieved  PT LONG TERM GOAL #3   Title  Patient will improve 6minWT to >10900ft improve LE endurance and improve ability to walk throughout the grocery store.     Baseline  6minWT: 52550ft; 02/26/2017: 76100ft 03/20/17: 73425ft; 05/08/2017: 86610ft    Time  8    Period  Weeks    Status  On-going      PT LONG TERM GOAL #4   Title  Patient will improve 10mWT scores to >1.0 m/s to demonstrate improvement with community ambulation without a RW.    Baseline  .2723m/s with rollator; 02/26/17: .4023m/s with SPC; .71 m/s; 03/20/2017: .3279m /s; 05/08/2017: .84    Time  8    Period  Weeks    Status  On-going      PT LONG TERM GOAL #5   Title  Patient will improve 5XSTS to under 16sec to demonstrate significant improvement in LE functional strength and decreased fall risk    Baseline  42sec; 02/26/17: 5xSTS: 21sec; 03/20/17: 19sec; 05/08/17: 16.3sec    Time  8    Period  Weeks    Status  On-going            Plan - 06/19/17 1353     Clinical Impression Statement  Patient demonstrates ability to perform greater amount of repetitions with STS compared to previous session, indicating increased LE strength and functional carryover between sessions.  Although patient demonstrates improvement, patient continues to demonstrate decreased dynamic balance and stability with a narrow BOS when ambulating indicating increase in fall risk and proprioception of the LEs.  Patient will continue to benefit from further skilled therapy to return to prior levels of function.    Rehab Potential  Good    Clinical Impairments Affecting Rehab Potential  (+) Highly motivated, family support (-) age, diabetes    PT Frequency  2x / week    PT Duration  8 weeks    PT Treatment/Interventions  Gait training;Moist Heat;Therapeutic activities;Therapeutic exercise;Balance training;Patient/family education;Neuromuscular re-education;Stair training;Functional mobility training;Passive range of motion;Manual techniques;Electrical Stimulation    PT Next Visit Plan  Progress strengthening, dynamic balance     PT Home Exercise Plan  See education section    Consulted and Agree with Plan of Care  Patient       Patient will benefit from skilled therapeutic intervention in order to improve the following deficits and impairments:  Abnormal gait, Decreased endurance, Decreased strength, Decreased balance, Difficulty walking, Decreased mobility  Visit Diagnosis: Muscle weakness (generalized)  Other lack of coordination  Difficulty in walking, not elsewhere classified     Problem List Patient Active Problem List   Diagnosis Date Noted  . DM (diabetes mellitus), 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  . Essential hypertension 11/01/2016  . Mixed hyperlipidemia 11/01/2016  . Memory change 11/01/2016  . History of stroke 10/01/2016    Temple PaciniHaley Seren Chaloux, SPT 06/19/2017, 3:44 PM  [place] [facility] [address] [place], [place], [address] Phone: [phone]   Fax:  [phone]  Name: Consuello MasseLinda Rohner MRN: [id number] Date of Birth: 12/16/1945

## 2017-06-24 ENCOUNTER — Ambulatory Visit: Payer: Medicare HMO | Attending: Family Medicine

## 2017-06-24 DIAGNOSIS — R262 Difficulty in walking, not elsewhere classified: Secondary | ICD-10-CM | POA: Insufficient documentation

## 2017-06-24 DIAGNOSIS — R278 Other lack of coordination: Secondary | ICD-10-CM | POA: Diagnosis not present

## 2017-06-24 DIAGNOSIS — M6281 Muscle weakness (generalized): Secondary | ICD-10-CM

## 2017-06-24 NOTE — Therapy (Signed)
[facility] [address], [place], [address] Phone: [phone]   Fax:  [phone]  Physical Therapy Treatment  Patient Details  Name: Crystal Miller MRN: [id number] Date of Birth: 1945-06-12 Referring Provider: Everlene Other DO   Encounter Date: 06/24/2017  PT End of Session - 06/24/17 1453    Visit Number  34    Number of Visits  46    Date for PT Re-Evaluation  06/19/17    PT Start Time  1345    PT Stop Time  1430    PT Time Calculation (min)  45 min    Equipment Utilized During Treatment  Gait belt    Activity Tolerance  Patient tolerated treatment well    Behavior During Therapy  [facility] for tasks assessed/performed       Past Medical History:  Diagnosis Date  . DM (diabetes mellitus), 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  . Essential hypertension 11/01/2016  . Mixed hyperlipidemia 11/01/2016  . Stroke [facility])     Past Surgical History:  Procedure Laterality Date  . CHOLECYSTECTOMY      There were no vitals filed for this visit.  Subjective Assessment - 06/24/17 1437    Subjective  Patient reports overall improvement since previous session and that she has been walking a lot. Patient reports no current pain. Patient reports doing HEP.     Pertinent History  PMH: Diabetes type II, HTN, Osteoporosis, CVA on 10/04/16    Limitations  Lifting;Standing;Walking    How long can you stand comfortably?  1 min    How long can you walk comfortably?      Diagnostic tests  MRI: Positive for CVA    Patient Stated Goals  Not to be dependent with walker    Currently in Pain?  No/denies         TREATMENT   Therapeutic Exercise:    Nustep level 6 - x 8 min with focus on improving speed  Sit to stands - 1x15, 1x5 for speed without use of UEs with pillow on seat to increase strength of LEs  Standing hip extension - 2x20 B to increase glute strength and endurance   Ambulation over 3" steps in  succession with therapist assistance and without UE support - x10 to improve dynamic balance  Standing hip abduction - 1x20 B  To increase strength and endurance of hip abductors   Ambulation without UE support 300 ft, ambulation with UE support 150 ft - with focus on speed, heel strike, stride length and arm swing  Partial squats with UE support - 2x15 to increase LE strength   Patient demonstrates increased fatigue at end of session.    PT Education - 06/24/17 1453    Education provided  Yes    Education Details  Form/technique with exercise    Person(s) Educated  Patient    Methods  Explanation    Comprehension  Verbalized understanding;Returned demonstration          PT Long Term Goals - 05/08/17 1510      PT LONG TERM GOAL #1   Title  Patient will be independent with HEP to continue benefits of therapy after discharge    Baseline  Dependent with exercise performance and progression    Time  8    Period  Weeks    Status  On-going      PT LONG TERM GOAL #2   Title  Patient will improve TUG  to <20sec to demonstrate significant improvement in fall risk.     Baseline  TUG: 35secs; 10/9.18: 26sec; 03/20/17: 22 sec; 05/08/2017: 17sec with rollator    Time  8    Period  Weeks    Status  Achieved      PT LONG TERM GOAL #3   Title  Patient will improve 6minWT to >108900ft improve LE endurance and improve ability to walk throughout the grocery store.     Baseline  6minWT: 57650ft; 02/26/2017: 73000ft 03/20/17: 75125ft; 05/08/2017: 8610ft    Time  8    Period  Weeks    Status  On-going      PT LONG TERM GOAL #4   Title  Patient will improve 10mWT scores to >1.0 m/s to demonstrate improvement with community ambulation without a RW.    Baseline  .2936m/s with rollator; 02/26/17: .8236m/s with SPC; .71 m/s; 03/20/2017: .3260m /s; 05/08/2017: .84    Time  8    Period  Weeks    Status  On-going      PT LONG TERM GOAL #5   Title  Patient will improve 5XSTS to under 16sec to demonstrate  significant improvement in LE functional strength and decreased fall risk    Baseline  42sec; 02/26/17: 5xSTS: 21sec; 03/20/17: 19sec; 05/08/17: 16.3sec    Time  8    Period  Weeks    Status  On-going            Plan - 06/24/17 1455    Clinical Impression Statement  Patient demonstrates increased ability to ambulate without UE support for a greater distance compared to previous session, indicating functional carryover between sessions and increase in LE strength and endurance. Although patient demonstrates improvement, patient continues to demonstrate decreased push-off bilaterally and narrow base of support with ambulation, indicating poor dynamic balance, proprioception and increase fall risk. Patient will continue to benefit from further skilled therapy to return to prior levels of function.    Rehab Potential  Good    Clinical Impairments Affecting Rehab Potential  (+) Highly motivated, family support (-) age, diabetes    PT Frequency  2x / week    PT Duration  8 weeks    PT Treatment/Interventions  Gait training;Moist Heat;Therapeutic activities;Therapeutic exercise;Balance training;Patient/family education;Neuromuscular re-education;Stair training;Functional mobility training;Passive range of motion;Manual techniques;Electrical Stimulation    PT Next Visit Plan  Progress strengthening, dynamic balance     PT Home Exercise Plan  See education section    Consulted and Agree with Plan of Care  Patient       Patient will benefit from skilled therapeutic intervention in order to improve the following deficits and impairments:  Abnormal gait, Decreased endurance, Decreased strength, Decreased balance, Difficulty walking, Decreased mobility  Visit Diagnosis: Muscle weakness (generalized)  Difficulty in walking, not elsewhere classified  Other lack of coordination     Problem List Patient Active Problem List   Diagnosis Date Noted  . DM (diabetes mellitus), type 2 with  complications (HCC) 11/01/2016  . Essential hypertension 11/01/2016  . Mixed hyperlipidemia 11/01/2016  . Memory change 11/01/2016  . History of stroke 10/01/2016    Temple PaciniHaley Nolene Rocks, SPT 06/24/2017, 3:02 PM  [facility] [address] [place], [place], [address] Phone: [phone]   Fax:  [phone]  Name: Crystal MasseLinda Miller MRN: [id number] Date of Birth: 01/05/1946

## 2017-06-25 NOTE — Addendum Note (Signed)
Addended by: Bethanie DickerISSELL, [place] V on: 06/25/2017 12:41 PM   Modules accepted: Orders

## 2017-06-26 ENCOUNTER — Ambulatory Visit: Payer: Medicare HMO

## 2017-06-26 DIAGNOSIS — R278 Other lack of coordination: Secondary | ICD-10-CM | POA: Diagnosis not present

## 2017-06-26 DIAGNOSIS — M6281 Muscle weakness (generalized): Secondary | ICD-10-CM | POA: Diagnosis not present

## 2017-06-26 DIAGNOSIS — R262 Difficulty in walking, not elsewhere classified: Secondary | ICD-10-CM

## 2017-06-26 NOTE — Therapy (Signed)
[place] [facility] [address], [place], [address] Phone: [phone]   Fax:  [phone]  Physical Therapy Treatment  Patient Details  Name: Crystal Miller MRN: [id number] Date of Birth: 11/07/1945 Referring Provider: Everlene Other DO   Encounter Date: 06/26/2017  PT End of Session - 06/26/17 1353    Visit Number  35    Number of Visits  46    Date for PT Re-Evaluation  07/22/17    PT Start Time  1300    PT Stop Time  1347    PT Time Calculation (min)  47 min    Equipment Utilized During Treatment  Gait belt    Activity Tolerance  Patient tolerated treatment well    Behavior During Therapy  [facility] for tasks assessed/performed       Past Medical History:  Diagnosis Date  . DM (diabetes mellitus), 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  . Essential hypertension 11/01/2016  . Mixed hyperlipidemia 11/01/2016  . Stroke [facility])     Past Surgical History:  Procedure Laterality Date  . CHOLECYSTECTOMY      There were no vitals filed for this visit.  Subjective Assessment - 06/26/17 1308    Subjective  Pt reports overall improvement and that she has been walking a lot since previous session. Pt reports it takes her 2 hours to get dressed in the morning.    Pertinent History  PMH: Diabetes type II, HTN, Osteoporosis, CVA on 10/04/16    Limitations  Lifting;Standing;Walking    How long can you stand comfortably?  1 min    How long can you walk comfortably?      Diagnostic tests  MRI: Positive for CVA    Patient Stated Goals  Not to be dependent with walker    Currently in Pain?  No/denies          TREATMENT   Therapeutic Exercise:    Nustep level 6 - x6 min with focus on improving speed Sidestepping on airex pad -1x15 to increase dynamic balance and LE strength Sit to stands - 2x10 for speed without use of UEs with pillow on seat to increase strength of LEs  Step ups on 4" step - 1x15 to increase LE  strength and dynamic balance Ambulation -- 856ft with focus on improving speed and step length with FWW      PT Education - 06/26/17 1353    Education provided  Yes    Education Details  Form/technique with exercise    Person(s) Educated  Patient    Comprehension  Verbalized understanding;Returned demonstration          PT Long Term Goals - 06/25/17 1025      PT LONG TERM GOAL #1   Title  Patient will be independent with HEP to continue benefits of therapy after discharge    Baseline  Dependent with exercise performance and progression    Time  8    Period  Weeks    Status  On-going      PT LONG TERM GOAL #2   Title  Patient will improve TUG to <20sec to demonstrate significant improvement in fall risk.     Baseline  TUG: 35secs; 10/9.18: 26sec; 03/20/17: 22 sec; 05/08/2017: 17sec with rollator;    Time  8    Period  Weeks    Status  Achieved      PT LONG TERM GOAL #3   Title  Patient will improve  6minWT to >105200ft improve LE endurance and improve ability to walk throughout the grocery store.     Baseline  6minWT: 56750ft; 02/26/2017: 73900ft 03/20/17: 76925ft; 05/08/2017: 89510ft; 06/25/2016: 85650ft    Time  8    Period  Weeks    Status  On-going      PT LONG TERM GOAL #4   Title  Patient will improve 10mWT scores to >1.0 m/s to demonstrate improvement with community ambulation without a RW.    Baseline  .4163m/s with rollator; 02/26/17: .2963m/s with SPC; .71 m/s; 03/20/2017: .3137m /s; 05/08/2017: .84; 06/25/2017: .4872m/s    Time  8    Period  Weeks    Status  On-going      PT LONG TERM GOAL #5   Title  Patient will improve 5XSTS to under 16sec to demonstrate significant improvement in LE functional strength and decreased fall risk    Baseline  42sec; 02/26/17: 5xSTS: 21sec; 03/20/17: 19sec; 05/08/17: 16.3sec; 06/25/16: 16 from seat with pillow on seat    Time  8    Period  Weeks    Status  On-going            Plan - 06/26/17 1354    Clinical Impression Statement  Patient  demonstrates ability perform greater reps of sit to stands with increased speed, indicating increased strength and endurance of the LEs.   Although pt demonstrates improvement, pt continues to require intermittent UE support while performing side steps on airex beam, indicating decreased dynamic balance. Pt will continue to benefit from fruther skilled therapy to return to prior levels of function.    Rehab Potential  Good    Clinical Impairments Affecting Rehab Potential  (+) Highly motivated, family support (-) age, diabetes    PT Frequency  2x / week    PT Duration  8 weeks    PT Treatment/Interventions  Gait training;Moist Heat;Therapeutic activities;Therapeutic exercise;Balance training;Patient/family education;Neuromuscular re-education;Stair training;Functional mobility training;Passive range of motion;Manual techniques;Electrical Stimulation    PT Next Visit Plan  Progress strengthening, dynamic balance     PT Home Exercise Plan  See education section    Consulted and Agree with Plan of Care  Patient       Patient will benefit from skilled therapeutic intervention in order to improve the following deficits and impairments:  Abnormal gait, Decreased endurance, Decreased strength, Decreased balance, Difficulty walking, Decreased mobility  Visit Diagnosis: Muscle weakness (generalized)  Difficulty in walking, not elsewhere classified  Other lack of coordination     Problem List Patient Active Problem List   Diagnosis Date Noted  . DM (diabetes mellitus), 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  . Essential hypertension 11/01/2016  . Mixed hyperlipidemia 11/01/2016  . Memory change 11/01/2016  . History of stroke 10/01/2016    Temple PaciniHaley Rudell Marlowe, SPT 06/26/2017, 1:59 PM  [place] [facility] [address] [place], [place], [address] Phone: [phone]   Fax:  [phone]  Name: Crystal MasseLinda Miller MRN: [id number] Date of Birth:  09/15/1945

## 2017-07-01 ENCOUNTER — Ambulatory Visit: Payer: Medicare HMO

## 2017-07-03 ENCOUNTER — Ambulatory Visit: Payer: Medicare HMO

## 2017-07-03 DIAGNOSIS — R278 Other lack of coordination: Secondary | ICD-10-CM

## 2017-07-03 DIAGNOSIS — R262 Difficulty in walking, not elsewhere classified: Secondary | ICD-10-CM

## 2017-07-03 DIAGNOSIS — M6281 Muscle weakness (generalized): Secondary | ICD-10-CM | POA: Diagnosis not present

## 2017-07-03 NOTE — Therapy (Addendum)
[place] [facility] [address], [place], [address] Phone: [phone]   Fax:  [phone]  Physical Therapy Treatment/Discharge Summary  Patient Details  Name: Crystal Miller MRN: [id number] Date of Birth: [birth year] Referring Provider: Thersa Salt DO   Encounter Date: 07/03/2017  PT End of Session - 07/03/17 1349    Visit Number  36    Number of Visits  46    Date for PT Re-Evaluation  07/22/17    PT Start Time  1300    PT Stop Time  1345    PT Time Calculation (min)  45 min    Equipment Utilized During Treatment  Gait belt    Activity Tolerance  Patient tolerated treatment well    Behavior During Therapy  [facility] for tasks assessed/performed       Past Medical History:  Diagnosis Date  . DM (diabetes mellitus), type 2 with complications ([place]) 8/67/6195  . Essential hypertension 11/01/2016  . Mixed hyperlipidemia 11/01/2016  . Stroke [facility])     Past Surgical History:  Procedure Laterality Date  . CHOLECYSTECTOMY      There were no vitals filed for this visit.  Subjective Assessment - 07/03/17 1303    Subjective  Pt states she has not been walking as much because it has been raining and she developed a "scratchy throat' on Saturday. Pt states she also feels her legs are "weak" today secondary to having been sick.     Pertinent History  PMH: Diabetes type II, HTN, Osteoporosis, CVA on 10/04/16    Limitations  Lifting;Standing;Walking    How long can you stand comfortably?  1 min    How long can you walk comfortably?  15mn     Diagnostic tests  MRI: Positive for CVA    Patient Stated Goals  Not to be dependent with walker    Currently in Pain?  No/denies        TREATMENT   Therapeutic Exercise:    Nustep level 6 - x5 min with focus on improving speed  Side steping on airex beam with UE support -5 ft x10 to increase dynamic balance  Standing marches with UE support --  1x40 to increase dynamic balance  and LE strength  Mini squat with UE support - 2x20 to increase LE strength and endurance  Sit to stands -- 2x8, 1x10 to increase LE strength and endurance   Standing hip extension - 2x15 B to increase strength of the hip extensors  Standing Hip abduction - 1x15 B, 1x20 B to increase strength and endurance of the hip abductors  Tandem stance -- 1x30 sec B, 1x60 sec B to increase static balance  Pt demonstrates increased fatigue with exercise at end of session.     PT Education - 07/03/17 1349    Education provided  Yes    Education Details  Discussed HEP for discharge    Person(s) Educated  Patient    Methods  Explanation;Demonstration    Comprehension  Verbalized understanding;Returned demonstration          PT Long Term Goals - 06/25/17 1025      PT LONG TERM GOAL #1   Title  Patient will be independent with HEP to continue benefits of therapy after discharge    Baseline  Dependent with exercise performance and progression    Time  8    Period  Weeks    Status  On-going      PT LONG  TERM GOAL #2   Title  Patient will improve TUG to <20sec to demonstrate significant improvement in fall risk.     Baseline  TUG: 35secs; 10/9.18: 26sec; 03/20/17: 22 sec; 05/08/2017: 17sec with rollator;    Time  8    Period  Weeks    Status  Achieved      PT LONG TERM GOAL #3   Title  Patient will improve 39mnWT to >1006fimprove LE endurance and improve ability to walk throughout the grocery store.     Baseline  59m71mT: 550f23f0/01/2017: 700ft73f31/18: 725ft;37f19/2018: 810ft; 86f2018: 850ft   83fe  8    Period  Weeks    Status  On-going      PT LONG TERM GOAL #4   Title  Patient will improve 10mWT sc31m to >1.0 m/s to demonstrate improvement with community ambulation without a RW.    Baseline  .80m/s wit70mllator; 02/26/17: .80m/s with62m; .71 m/[place]10/31/2018: .48m /s; 12/89m018: .84; 06/25/2017: .32m/s    Tim8m    Period  Weeks    Status  On-going      PT LONG TERM  GOAL #5   Title  Patient will improve 5XSTS to under 16sec to demonstrate significant improvement in LE functional strength and decreased fall risk    Baseline  42sec; 02/26/17: 5xSTS: 21sec; 03/20/17: 19sec; 05/08/17: 16.3sec; 06/25/16: 16 from seat with pillow on seat    Time  8    Period  Weeks    Status  On-going            Plan - 07/03/17 1349    Clinical Impression Statement  Patient demonstrates ability to maintian tandem stance for increased time without UE suport compared to previous session, indicating functional carryover between sessions and increased static balance. Pt also demonstrates increased speed with sit to stand and ability to perform more reps of sit to stands compared to previous session, indicating increased strength and endurance of the LEs. Pt also reports spending more time ambulating outside of therapy.  Pt reports she has met long term goals, and pt does not require much cueing to perform HEP with proper technique.PT reports she feels confident performing her HEP outside of therapy to continue benefits of therapy after discharge.    Rehab Potential  Good    Clinical Impairments Affecting Rehab Potential  (+) Highly motivated, family support (-) age, diabetes    PT Frequency  2x / week    PT Duration  8 weeks    PT Treatment/Interventions  Gait training;Moist Heat;Therapeutic activities;Therapeutic exercise;Balance training;Patient/family education;Neuromuscular re-education;Stair training;Functional mobility training;Passive range of motion;Manual techniques;Electrical Stimulation    PT Next Visit Plan  Progress strengthening, dynamic balance     PT Home Exercise Plan  See education section    Consulted and Agree with Plan of Care  Patient       Patient will benefit from skilled therapeutic intervention in order to improve the following deficits and impairments:  Abnormal gait, Decreased endurance, Decreased strength, Decreased balance, Difficulty walking,  Decreased mobility  Visit Diagnosis: Muscle weakness (generalized)  Difficulty in walking, not elsewhere classified  Other lack of coordination     Problem List Patient Active Problem List   Diagnosis Date Noted  . DM (diabetes mellitus), type 2 with complications (HCC) 06/14/20[place] .73/53/2992 hypertension 11/01/2016  . Mixed hyperlipidemia 11/01/2016  . Memory change 11/01/2016  . History of stroke 10/01/2016    Crystal Miller, Ricard Dillon19, 2:15  PM  [place] [facility] [address], [place], [address] Phone: [phone]   Fax:  [phone]  Name: Crystal Miller MRN: [id number] Date of Birth: [birth year]

## 2017-07-17 ENCOUNTER — Ambulatory Visit: Payer: Medicare Other | Admitting: Family Medicine

## 2017-07-30 ENCOUNTER — Other Ambulatory Visit: Payer: Self-pay

## 2017-07-30 ENCOUNTER — Ambulatory Visit (INDEPENDENT_AMBULATORY_CARE_PROVIDER_SITE_OTHER): Payer: Medicare HMO | Admitting: Family Medicine

## 2017-07-30 ENCOUNTER — Encounter: Payer: Self-pay | Admitting: Family Medicine

## 2017-07-30 VITALS — BP 170/84 | HR 78 | Temp 98.2°F | Wt 134.0 lb

## 2017-07-30 DIAGNOSIS — E118 Type 2 diabetes mellitus with unspecified complications: Secondary | ICD-10-CM

## 2017-07-30 DIAGNOSIS — E782 Mixed hyperlipidemia: Secondary | ICD-10-CM | POA: Diagnosis not present

## 2017-07-30 DIAGNOSIS — R0989 Other specified symptoms and signs involving the circulatory and respiratory systems: Secondary | ICD-10-CM | POA: Diagnosis not present

## 2017-07-30 DIAGNOSIS — I10 Essential (primary) hypertension: Secondary | ICD-10-CM

## 2017-07-30 DIAGNOSIS — Z8673 Personal history of transient ischemic attack (TIA), and cerebral infarction without residual deficits: Secondary | ICD-10-CM | POA: Diagnosis not present

## 2017-07-30 LAB — COMPREHENSIVE METABOLIC PANEL
ALBUMIN: 4.1 g/dL (ref 3.5–5.2)
ALK PHOS: 76 U/L (ref 39–117)
ALT: 26 U/L (ref 0–35)
AST: 20 U/L (ref 0–37)
BUN: 23 mg/dL (ref 6–23)
CO2: 27 mEq/L (ref 19–32)
CREATININE: 0.98 mg/dL (ref 0.40–1.20)
Calcium: 9.7 mg/dL (ref 8.4–10.5)
Chloride: 101 mEq/L (ref 96–112)
GFR: 59.12 mL/min — ABNORMAL LOW (ref >60.00)
Glucose, Bld: 127 mg/dL — ABNORMAL HIGH (ref 70–99)
Potassium: 4.9 mEq/L (ref 3.5–5.1)
SODIUM: 134 meq/L — AB (ref 135–145)
TOTAL PROTEIN: 7.8 g/dL (ref 6.0–8.3)
Total Bilirubin: 0.4 mg/dL (ref 0.2–1.2)

## 2017-07-30 LAB — HEMOGLOBIN A1C: Hgb A1c MFr Bld: 6.8 % — ABNORMAL HIGH (ref 4.6–6.5)

## 2017-07-30 LAB — LDL CHOLESTEROL, DIRECT: LDL DIRECT: 49 mg/dL

## 2017-07-30 MED ORDER — AMLODIPINE BESYLATE 5 MG PO TABS: 5.0000 mg | ORAL_TABLET | Freq: Every day | ORAL | 3 refills | Status: DC

## 2017-07-30 NOTE — Addendum Note (Signed)
Addended by: Birdie SonsSONNENBERG, Porschea Borys G on: 07/30/2017 12:15 PM   Modules accepted: Orders

## 2017-07-30 NOTE — Patient Instructions (Signed)
Nice to see you. We will check lab work today and contact you with the results. Please restart your amlodipine at 5 mg.  If after the next week your blood pressure does not start to come down please increase to 10 mg and for this you may take two 5 mg tablets daily.  We will have you return in 1 month for BP recheck. Please discontinue the metformin. If your loose stools do not improve please be evaluated. If you develop chest pain, shortness of breath, numbness, or weakness please be evaluated

## 2017-07-30 NOTE — Progress Notes (Addendum)
Tommi Rumps, MD Phone: [phone]  Crystal Miller is a 72 y.o. female who presents today for f/u.  HYPERTENSION Disease Monitoring: Blood pressure [id number] systolically Chest pain- no      Dyspnea- no Medications: Compliance- taking lisinopril, they think she stopped the amlodipine   Edema- no  DIABETES Disease Monitoring: Blood Sugar ranges-120-160 Polyuria/phagia/dipsia- yes, nocturia      Optho- in November Medications: Compliance- taking metformin, has had diarrhea with no abdominal pain, blood in her stool, or other symptoms. Has been going on since 2 weeks after starting the metformin. Comes on suddenly and is difficult to control. Hypoglycemic symptoms- no  HYPERLIPIDEMIA Disease Monitoring: See symptoms for Hypertension Medications: Compliance- taking lipitor Right upper quadrant pain- no  Muscle aches- no  History of stroke: has been stable with regards to this. Has had numb sensation in left foot and left hand that has been chronic since the stroke. No new numbness or weakness. Prior MRI with no acute changes. Continues on aspirin. Saw neurology.   Social History   Tobacco Use  Smoking Status Former Smoker  . Years: 20.00  . Types: Cigarettes  Smokeless Tobacco Never Used     ROS see history of present illness  Objective  Physical Exam Vitals:   07/30/17 1035  BP: (!) 170/84  Pulse: 78  Temp: 98.2 F (36.8 C)  SpO2: 97%    BP Readings from Last 3 Encounters:  07/30/17 (!) 170/84  03/06/17 (!) 160/80  12/04/16 (!) 144/80   Wt Readings from Last 3 Encounters:  07/30/17 134 lb (60.8 kg)  03/06/17 132 lb 12.8 oz (60.2 kg)  12/04/16 140 lb 9.6 oz (63.8 kg)    Physical Exam  Constitutional: No distress.  Cardiovascular: Normal rate, regular rhythm and normal heart sounds.  Pulmonary/Chest: Effort normal and breath sounds normal.  Musculoskeletal: She exhibits no edema.  Neurological: She is alert.  CN 2-12 intact, 4+/5 strength left  biceps, triceps, grip, hamstring, quad, plantar and dorsiflexion, 5/5 strength in right biceps, triceps, grip, quads, hamstrings, plantar and dorsiflexion, slight decreased light touch sensation in left fingers, otherwise sensation to light touch intact in bilateral UE and LE  Skin: Skin is warm and dry. She is not diaphoretic.   Diabetic Foot Exam - Simple   Simple Foot Form Diabetic Foot exam was performed with the following findings:  Yes 07/30/2017 12:08 PM  Visual Inspection No deformities, no ulcerations, no other skin breakdown bilaterally:  Yes Sensation Testing Intact to touch and monofilament testing bilaterally:  Yes Pulse Check See comments:  Yes Comments Decreased left PT and DP pulses, decreased right PT pulse, intact right DP pulse      Assessment/Plan: Please see individual problem list.  Essential hypertension Slightly elevated though apparently has been off of her amlodipine as she was concerned about seeing this medication on a commercial on TV.  It does not appear that this individual medication has been recalled though it appears that a combination tablet with valsartan has been recalled.  I reinforced this with the patient.  Will confirm with our clinical pharmacist.  We will restart her on amlodipine 5 mg daily.  She will monitor blood pressure for the next week or so and if not coming down increase the amlodipine to 10 mg daily.  She will follow-up in 1 month for repeat blood pressure.  DM (diabetes mellitus), type 2 with complications ([place]) Has been well controlled.  She has been intolerant of the metformin with diarrhea.  She was  advised to discontinue the metformin.  If the diarrhea does not improve she will let us know and we will evaluate further.  Will check an A1c.  Mixed hyperlipidemia Check LDL.  Continue current medication.  History of stroke Stable.  No new symptoms.  Continue to follow with neurology.  Decreased pedal pulses Diminished pedal pulses  bilaterally.  We will obtain ABIs.   Orders Placed This Encounter  Procedures  . Comp Met (CMET)  . Direct LDL  . HgB A1c    Meds ordered this encounter  Medications  . amLODipine (NORVASC) 5 MG tablet    Sig: Take 1 tablet (5 mg total) by mouth daily.    Dispense:  90 tablet    Refill:  [place], MD [place]

## 2017-07-30 NOTE — Assessment & Plan Note (Signed)
Has been well controlled.  She has been intolerant of the metformin with diarrhea.  She was advised to discontinue the metformin.  If the diarrhea does not improve she will let us know and we will evaluate further.  Will check an A1c.

## 2017-07-30 NOTE — Assessment & Plan Note (Signed)
Stable.  No new symptoms.  Continue to follow with neurology.

## 2017-07-30 NOTE — Assessment & Plan Note (Signed)
Diminished pedal pulses bilaterally.  We will obtain ABIs.

## 2017-07-30 NOTE — Assessment & Plan Note (Signed)
Check LDL.  Continue current medication.

## 2017-07-30 NOTE — Assessment & Plan Note (Signed)
Slightly elevated though apparently has been off of her amlodipine as she was concerned about seeing this medication on a commercial on TV.  It does not appear that this individual medication has been recalled though it appears that a combination tablet with valsartan has been recalled.  I reinforced this with the patient.  Will confirm with our clinical pharmacist.  We will restart her on amlodipine 5 mg daily.  She will monitor blood pressure for the next week or so and if not coming down increase the amlodipine to 10 mg daily.  She will follow-up in 1 month for repeat blood pressure.

## 2017-08-01 ENCOUNTER — Telehealth: Payer: Self-pay | Admitting: Family Medicine

## 2017-08-01 ENCOUNTER — Ambulatory Visit: Payer: Self-pay | Admitting: *Deleted

## 2017-08-01 NOTE — Telephone Encounter (Signed)
See telephone note from 06/07/17, and below message

## 2017-08-01 NOTE — Telephone Encounter (Signed)
Pt daughter has questions about her zoloft 100mg  and she was taking 25 mg tablets and no one has mentioned a change to her daughter or is this in error (Chrischelle Claudette LawsWatson and yes is on DPR   Patient daughter is calling to inform office that her mother's dosing on Zoloft is incorrect. She was given 100 mg along with 25 mg. She has been taking this for the month. Told patient we would research the change and let her know- thank you for alerting us.  Reason for Disposition . Caller has URGENT medication question about med that PCP prescribed and triager unable to answer question  Protocols used: MEDICATION QUESTION CALL-A-AH

## 2017-08-01 NOTE — Telephone Encounter (Signed)
Please let the patient know that the clinical pharmacist confirmed that amlodipine on its own was not recalled.  Thanks.

## 2017-08-01 NOTE — Telephone Encounter (Signed)
-----   Message from Allena Katzaroline E Welles, [facility] sent at 07/30/2017  7:20 PM EDT ----- Correct! Only amlodipine/valsartan or amlodipine/valsartan/HCTZ has been affected. Nothing wrong with amlodipine by itself.   Rayfield Citizenaroline  ----- Message ----- From: Glori LuisSonnenberg, Jamyron Redd G, MD Sent: 07/30/2017  12:14 PM To: Allena Katzaroline E Welles, [facility]  Merlene PullingHey Caroline,   This patient mentioned seeing a commercial previously about a possible amlodipine recall. I thought this was related to a combo tablet with amlodipine/valsartan. Can you confirm whether or not there has been an amlodipine recall? Thanks.  Minerva AreolaEric

## 2017-08-02 NOTE — Telephone Encounter (Signed)
Please advise 

## 2017-08-02 NOTE — Telephone Encounter (Signed)
Please confirm the dose she is taking and also the dose she was taking. The only dose we have in our system is for zoloft 100 mg daily. Please confirm their question and concern. Thanks.

## 2017-08-02 NOTE — Telephone Encounter (Signed)
Patient daughter states she has only ever take 25 mg

## 2017-08-02 NOTE — Telephone Encounter (Signed)
Patient notified

## 2017-08-03 MED ORDER — SERTRALINE HCL 25 MG PO TABS: 25.0000 mg | ORAL_TABLET | Freq: Every day | ORAL | 1 refills | Status: DC

## 2017-08-03 NOTE — Telephone Encounter (Signed)
Noted.  25 mg tablets sent to pharmacy.

## 2017-08-27 IMAGING — MG MM DIGITAL DIAGNOSTIC UNILAT*R* W/ TOMO W/ CAD
6 series · 6 of 14 positions shown · non-contrast
Comparison: Previous exam(s).

CLINICAL DATA: Callback from baseline screening mammogram for
possible right breast asymmetries

EXAM:
2D DIGITAL DIAGNOSTIC RIGHT MAMMOGRAM WITH CAD AND ADJUNCT TOMO
ULTRASOUND RIGHT BREAST

[R CC synth-2D]
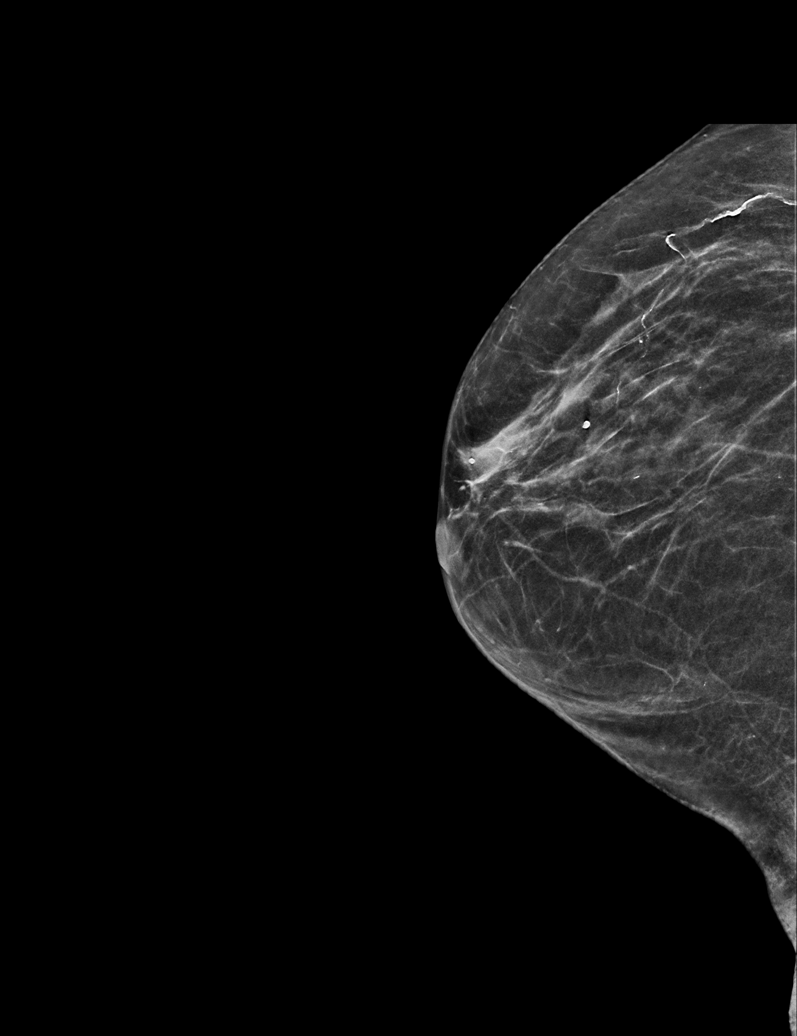

[R CC]
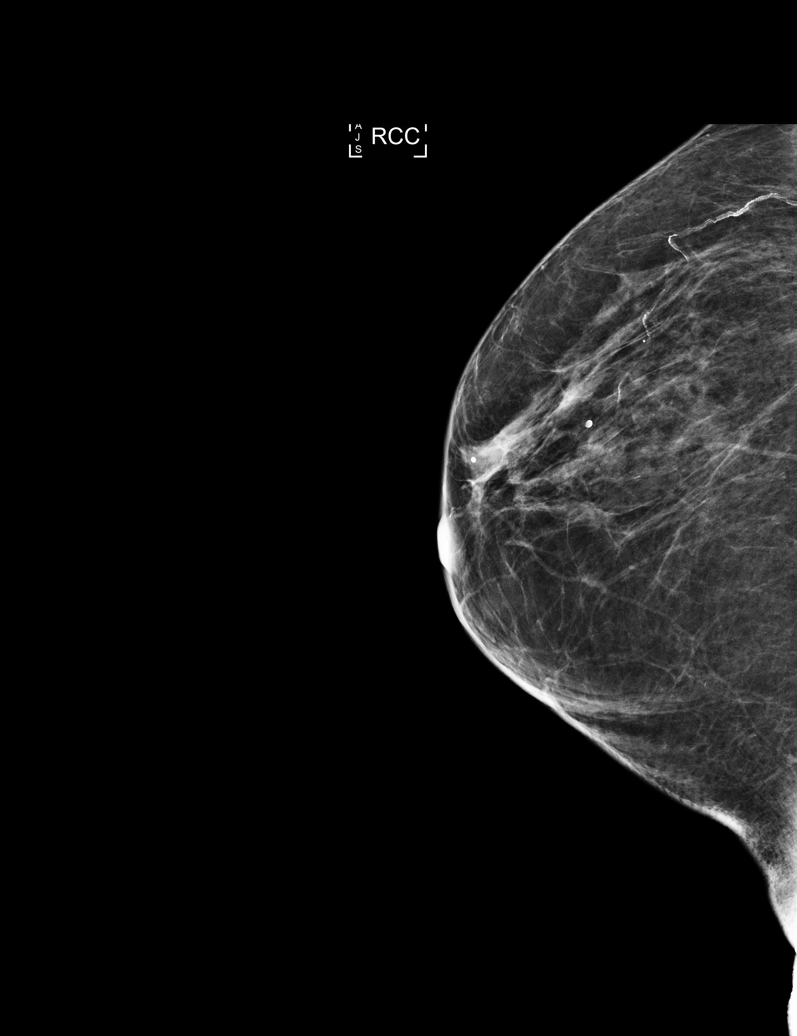

[R MLO synth-2D]
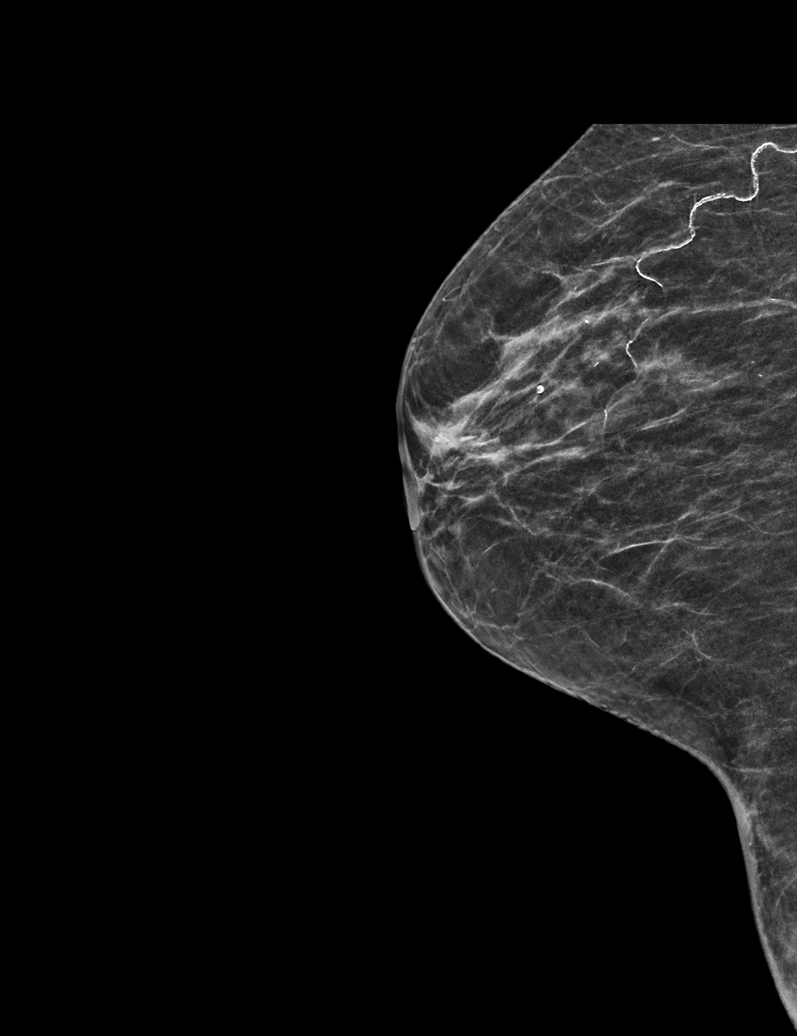

[R MLO]
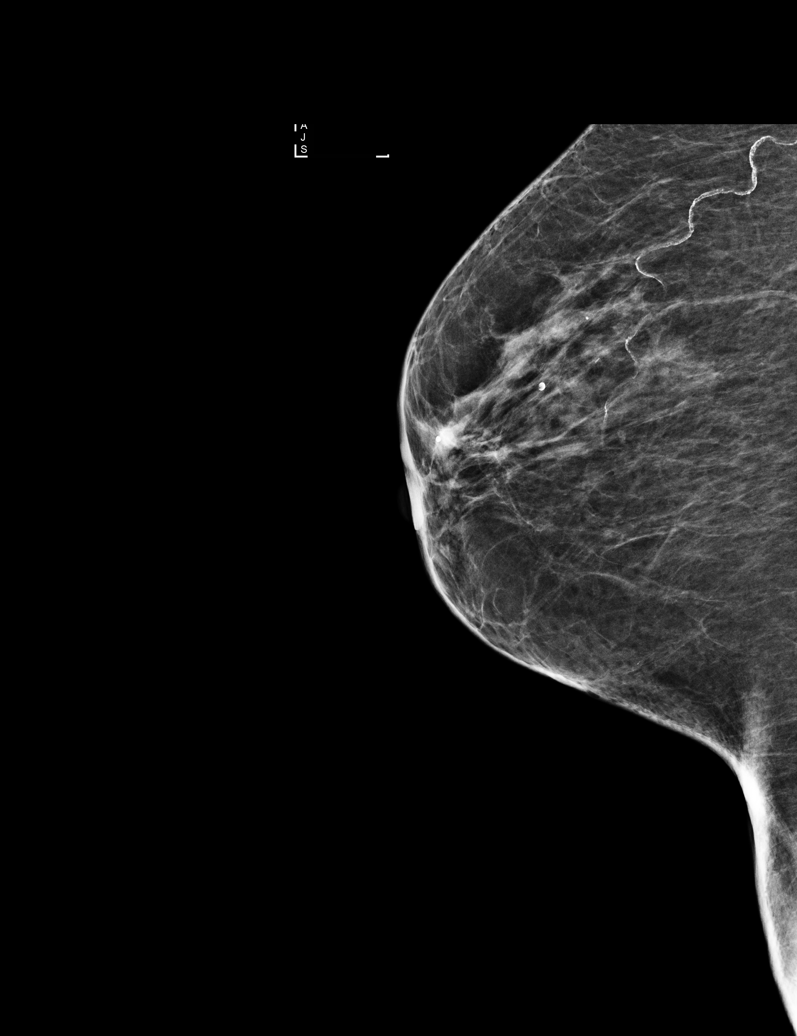

[R CC tomo · tomo slice 23/45.0]
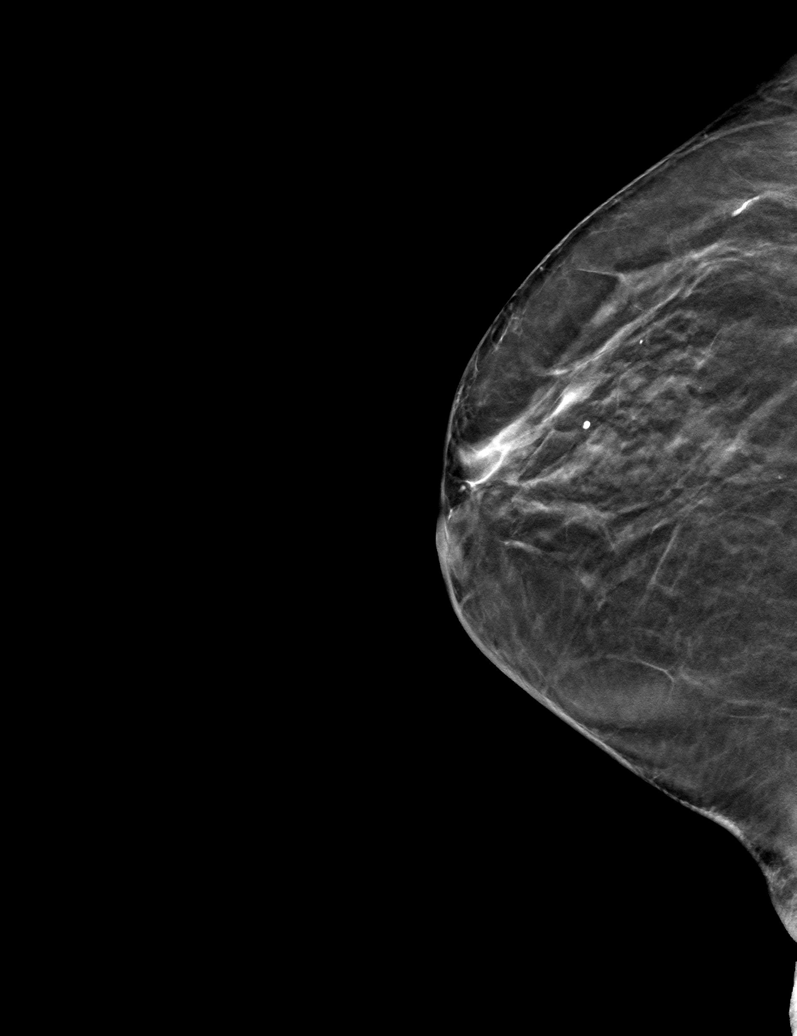

[R MLO tomo · tomo slice 21/40.0]
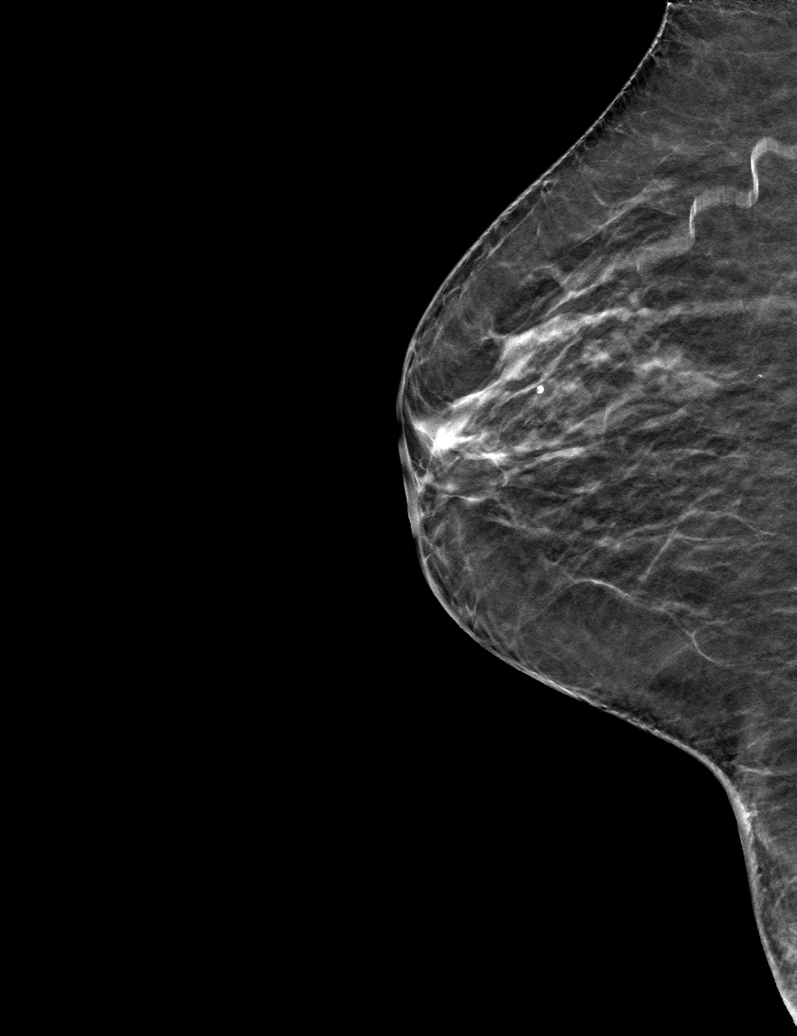

[6 of 14 positions shown; findings below may reference images not displayed]

ACR Breast Density Category b: There are scattered areas of
fibroglandular density.
FINDINGS: Additional views of the right breast demonstrate no suspicious mass,
calcifications, or other abnormality in the areas of concern seen on
screening mammogram.

Mammographic images were processed with CAD.

On physical exam, no discrete mass is felt in the areas of concern
within the superior, subareolar, or medial right breast.

Targeted ultrasound of the superior, medial, and subareolar right
breast was performed demonstrating no suspicious cystic or solid
sonographic finding in the areas of concern.
IMPRESSION: No mammographic or sonographic evidence of malignancy.

RECOMMENDATION:
Screening mammogram in one year.(Code:[id number]-H-I36)

I have discussed the findings and recommendations with the patient.
Results were also provided in writing at the conclusion of the
visit. If applicable, a reminder letter will be sent to the patient
regarding the next appointment.

BI-RADS CATEGORY  1: Negative.

## 2017-08-31 DIAGNOSIS — Z823 Family history of stroke: Secondary | ICD-10-CM | POA: Diagnosis not present

## 2017-08-31 DIAGNOSIS — Z809 Family history of malignant neoplasm, unspecified: Secondary | ICD-10-CM | POA: Diagnosis not present

## 2017-08-31 DIAGNOSIS — E119 Type 2 diabetes mellitus without complications: Secondary | ICD-10-CM | POA: Diagnosis not present

## 2017-08-31 DIAGNOSIS — E785 Hyperlipidemia, unspecified: Secondary | ICD-10-CM | POA: Diagnosis not present

## 2017-08-31 DIAGNOSIS — I10 Essential (primary) hypertension: Secondary | ICD-10-CM | POA: Diagnosis not present

## 2017-08-31 DIAGNOSIS — R69 Illness, unspecified: Secondary | ICD-10-CM | POA: Diagnosis not present

## 2017-08-31 DIAGNOSIS — Z7983 Long term (current) use of bisphosphonates: Secondary | ICD-10-CM | POA: Diagnosis not present

## 2017-08-31 DIAGNOSIS — R32 Unspecified urinary incontinence: Secondary | ICD-10-CM | POA: Diagnosis not present

## 2017-08-31 DIAGNOSIS — Z7982 Long term (current) use of aspirin: Secondary | ICD-10-CM | POA: Diagnosis not present

## 2017-08-31 DIAGNOSIS — M810 Age-related osteoporosis without current pathological fracture: Secondary | ICD-10-CM | POA: Diagnosis not present

## 2017-09-03 ENCOUNTER — Ambulatory Visit (INDEPENDENT_AMBULATORY_CARE_PROVIDER_SITE_OTHER): Payer: Medicare HMO | Admitting: *Deleted

## 2017-09-03 VITALS — BP 144/68 | HR 69 | Resp 16

## 2017-09-03 DIAGNOSIS — I10 Essential (primary) hypertension: Secondary | ICD-10-CM | POA: Diagnosis not present

## 2017-09-03 NOTE — Progress Notes (Signed)
Patient in for one month follow up after starting amlodipine 5 mg on 07/30/17.Patient brought copy of BP readings as per PCP guidelines given to patient at OV patient increased amlodipine to 10 mg on 08/19/17. Patient reported she takes 5 mg in the morning and 5 mg in the afternoon on most days some days she takes 10 mg at bedtime or 10 mg in the morning. Nurse advised patient should try to take the same time each day not to take at bedtime one night and the next morning take 10 mg, advised better to take in the morning or at night consistently not to alternate.  Patient BP taken in left arm 148/76 pulse 72; 10 -12 minutes later BP right arm 144/68 pulse 69. Patient took 10 mg amlodipine night before. Patient also asked for copy of labs and would like to have referral to a nutritionist to get A1c down.

## 2017-09-04 ENCOUNTER — Telehealth: Payer: Self-pay | Admitting: *Deleted

## 2017-09-04 NOTE — Telephone Encounter (Signed)
Left message for patient to call office PEC may advise patient of below statement from PCP.    Attestation signed by Glori LuisSonnenberg, Eric G, MD at 09/03/2017 6:01 PM  Blood pressure is not consistently well controlled at home.  It does not appear she is taking this in a consistent manner.  It has improved some days with the amlodipine.  She should be taking 10 mg of amlodipine once daily around the same time each day.  I would suggest she do this for a week and we recheck in the office and if it remains elevated at home and in the office we will need to increase her lisinopril or add an additional medication.  Thanks.

## 2017-09-13 NOTE — Telephone Encounter (Signed)
Left message for patient to return call to office. 

## 2017-09-13 NOTE — Progress Notes (Signed)
See phone note

## 2017-09-13 NOTE — Telephone Encounter (Signed)
Patient scheduled next Thursday for BP check.

## 2017-09-16 DIAGNOSIS — R202 Paresthesia of skin: Secondary | ICD-10-CM | POA: Diagnosis not present

## 2017-09-16 DIAGNOSIS — R69 Illness, unspecified: Secondary | ICD-10-CM | POA: Diagnosis not present

## 2017-09-16 DIAGNOSIS — R0683 Snoring: Secondary | ICD-10-CM | POA: Diagnosis not present

## 2017-09-16 DIAGNOSIS — R200 Anesthesia of skin: Secondary | ICD-10-CM | POA: Diagnosis not present

## 2017-09-16 DIAGNOSIS — I639 Cerebral infarction, unspecified: Secondary | ICD-10-CM | POA: Diagnosis not present

## 2017-09-16 DIAGNOSIS — I6350 Cerebral infarction due to unspecified occlusion or stenosis of unspecified cerebral artery: Secondary | ICD-10-CM | POA: Diagnosis not present

## 2017-09-19 ENCOUNTER — Ambulatory Visit (INDEPENDENT_AMBULATORY_CARE_PROVIDER_SITE_OTHER): Payer: Medicare HMO

## 2017-09-19 DIAGNOSIS — I10 Essential (primary) hypertension: Secondary | ICD-10-CM

## 2017-09-19 NOTE — Progress Notes (Signed)
BP still above goal at home. We could increase her lisinopril or add an additional medication. If we increased the lisinopril she would need lab work one week after increasing the dose. If she went with the other medication, likely carvedilol, she would not need labs.  Please see which she would prefer.

## 2017-09-19 NOTE — Progress Notes (Signed)
Patient comes in for blood pressure check.   Patient is taking Amlodipine 10 mg daily in am consistently .    Blood pressure at home when she checks it is  Averaging 150-160/70.  When she was at neurologist and  blood pressure was checked it was  156/72.   Blood pressure today left arm 142/68 right arm 144/66 pulse 69 O2 96%. Patient  reports no lower leg swelling, headache.  Advised patient to continue medication as directed by PCP.   We will contact you with any changes.

## 2017-09-24 NOTE — Progress Notes (Signed)
Left message to call.

## 2017-09-24 NOTE — Progress Notes (Addendum)
Spoke to  Patient and she wants to increase lisinopril for now , due to she wants to finish medication she has . Then she wants to switch to another medication.

## 2017-09-25 ENCOUNTER — Ambulatory Visit (INDEPENDENT_AMBULATORY_CARE_PROVIDER_SITE_OTHER): Payer: Medicare HMO

## 2017-09-25 DIAGNOSIS — R0989 Other specified symptoms and signs involving the circulatory and respiratory systems: Secondary | ICD-10-CM

## 2017-09-25 MED ORDER — LISINOPRIL 40 MG PO TABS: 40.0000 mg | ORAL_TABLET | Freq: Every day | ORAL | 2 refills | Status: DC

## 2017-09-25 NOTE — Progress Notes (Signed)
We will increase lisinopril to 40 mg daily.  The option is to stay on lisinopril at the increased dose of 40 mg daily and see how she does with this and if her blood pressure does not improve we would add an additional medication.  We will have her return in 2 to 3 weeks for recheck of blood pressure on the 40 mg dose of lisinopril.  If her blood pressure has not come down at that time we would add an additional medication and continue the lisinopril at 40 mg daily.  Please schedule her for a BP check in 2 to 3 weeks with the nurse.  Please schedule her for lab work for 7 days after she starts the increased dose of lisinopril.  Lab orders have been placed.

## 2017-09-25 NOTE — Addendum Note (Signed)
Addended by: Glori Luis on: 09/25/2017 05:38 PM   Modules accepted: Orders

## 2017-09-26 NOTE — Progress Notes (Signed)
Left message to return call 

## 2017-09-26 NOTE — Progress Notes (Signed)
Patient advised of above . She will increase Lisinopril to 40 mg.  Lab appointment scheduled and nurse visit scheduled.

## 2017-10-01 ENCOUNTER — Other Ambulatory Visit: Payer: Self-pay | Admitting: Family Medicine

## 2017-10-01 DIAGNOSIS — R6889 Other general symptoms and signs: Secondary | ICD-10-CM

## 2017-10-03 ENCOUNTER — Other Ambulatory Visit (INDEPENDENT_AMBULATORY_CARE_PROVIDER_SITE_OTHER): Payer: Medicare HMO

## 2017-10-03 DIAGNOSIS — I10 Essential (primary) hypertension: Secondary | ICD-10-CM

## 2017-10-03 LAB — BASIC METABOLIC PANEL
BUN: 29 mg/dL — AB (ref 6–23)
CHLORIDE: 102 meq/L (ref 96–112)
CO2: 25 meq/L (ref 19–32)
CREATININE: 1.27 mg/dL — AB (ref 0.40–1.20)
Calcium: 9.6 mg/dL (ref 8.4–10.5)
GFR: 43.82 mL/min — ABNORMAL LOW (ref >60.00)
Glucose, Bld: 180 mg/dL — ABNORMAL HIGH (ref 70–99)
Potassium: 5.7 mEq/L — ABNORMAL HIGH (ref 3.5–5.1)
Sodium: 133 mEq/L — ABNORMAL LOW (ref 135–145)

## 2017-10-04 ENCOUNTER — Telehealth: Payer: Self-pay

## 2017-10-04 ENCOUNTER — Telehealth: Payer: Self-pay | Admitting: Family Medicine

## 2017-10-04 DIAGNOSIS — E875 Hyperkalemia: Secondary | ICD-10-CM

## 2017-10-04 MED ORDER — LISINOPRIL 20 MG PO TABS: 20.0000 mg | ORAL_TABLET | Freq: Every day | ORAL | 1 refills | Status: DC

## 2017-10-04 NOTE — Telephone Encounter (Signed)
Copied from CRM [id number]. Topic: Quick Communication - See Telephone Encounter >> Oct 04, 2017  9:20 AM Cipriano Bunker wrote: CRM for notification. See Telephone encounter for: 10/04/17.  Patient returning call about labs. Please call her

## 2017-10-04 NOTE — Telephone Encounter (Signed)
See result note.  

## 2017-10-04 NOTE — Telephone Encounter (Signed)
-----   Message from Glori Luis, MD sent at 10/03/2017  4:33 PM EDT ----- I attempted to contact the patient regarding her lab results.  There is no answer.  I left a message asking her to call back to the office.  Her potassium is up and her kidney functions up slightly.  This is likely related to the increased dose of lisinopril.  We will need to decrease her dose of lisinopril back to 20 mg daily.  We will need to recheck these labs early next week.  Please try to contact her with these results.  If she is having any palpitations or chest pain she should be evaluated otherwise we will plan on rechecking labs early next week.  Please find out what her blood pressure has been running as well.  Thanks.

## 2017-10-07 ENCOUNTER — Other Ambulatory Visit (INDEPENDENT_AMBULATORY_CARE_PROVIDER_SITE_OTHER): Payer: Medicare HMO

## 2017-10-07 ENCOUNTER — Telehealth: Payer: Self-pay | Admitting: Radiology

## 2017-10-07 DIAGNOSIS — E875 Hyperkalemia: Secondary | ICD-10-CM | POA: Diagnosis not present

## 2017-10-07 LAB — BASIC METABOLIC PANEL
BUN: 47 mg/dL — AB (ref 6–23)
CHLORIDE: 108 meq/L (ref 96–112)
CO2: 22 mEq/L (ref 19–32)
CREATININE: 1.34 mg/dL — AB (ref 0.40–1.20)
Calcium: 9.5 mg/dL (ref 8.4–10.5)
GFR: 41.18 mL/min — AB (ref >60.00)
Glucose, Bld: 143 mg/dL — ABNORMAL HIGH (ref 70–99)
POTASSIUM: 6.4 meq/L — AB (ref 3.5–5.1)
Sodium: 136 mEq/L (ref 135–145)

## 2017-10-07 NOTE — Telephone Encounter (Signed)
Lab noted. I attempted to call the patient to discuss the lab results though there was no answer and no voicemail. Please call the patient and let her know that her potassium is higher than it was previously. She should discontinue the lisinopril. Please find out if she has had any muscle weakness that is new or worsening, chest pain, or palpitations. If she has she should go to the ED for evaluation. If she has not we will need to recheck her potassium on Wednesday after she has been off of the lisinopril. We should plan to check her BP the same day with a nurse visit. If she develops any of the above listed symptoms she should go to the ED for evaluation. Thanks.

## 2017-10-07 NOTE — Telephone Encounter (Signed)
Pt calling back to speak with the nurse or Dr. Birdie Sons. Please call pts cellphone: [phone]

## 2017-10-07 NOTE — Telephone Encounter (Signed)
Please see prior note and also make sure that the patient is urinating normally. Thanks.

## 2017-10-07 NOTE — Telephone Encounter (Signed)
fyi

## 2017-10-07 NOTE — Telephone Encounter (Signed)
Called and spoke with pt. Pt informed to stop the lisinopril and given the message by Dr Birdie Sons. Pt denies any muscle weakness, chest pain, and palpitations. Is urinating normally per pt.  Lab work and Nurse visit ordered for Wednesday.

## 2017-10-07 NOTE — Telephone Encounter (Signed)
Lab ordered.

## 2017-10-07 NOTE — Addendum Note (Signed)
Addended by: Glori Luis on: 10/07/2017 06:04 PM   Modules accepted: Orders

## 2017-10-07 NOTE — Telephone Encounter (Signed)
[facility] called with pt critical K of 6.4

## 2017-10-09 ENCOUNTER — Ambulatory Visit (INDEPENDENT_AMBULATORY_CARE_PROVIDER_SITE_OTHER): Payer: Medicare HMO | Admitting: *Deleted

## 2017-10-09 ENCOUNTER — Other Ambulatory Visit (INDEPENDENT_AMBULATORY_CARE_PROVIDER_SITE_OTHER): Payer: Medicare HMO

## 2017-10-09 VITALS — BP 130/78 | HR 69 | Resp 16

## 2017-10-09 DIAGNOSIS — I10 Essential (primary) hypertension: Secondary | ICD-10-CM | POA: Diagnosis not present

## 2017-10-09 DIAGNOSIS — E875 Hyperkalemia: Secondary | ICD-10-CM

## 2017-10-09 LAB — BASIC METABOLIC PANEL
BUN: 37 mg/dL — ABNORMAL HIGH (ref 6–23)
CALCIUM: 9.8 mg/dL (ref 8.4–10.5)
CO2: 23 mEq/L (ref 19–32)
CREATININE: 1.21 mg/dL — AB (ref 0.40–1.20)
Chloride: 106 mEq/L (ref 96–112)
GFR: 46.33 mL/min — AB (ref >60.00)
Glucose, Bld: 147 mg/dL — ABNORMAL HIGH (ref 70–99)
Potassium: 5.5 mEq/L — ABNORMAL HIGH (ref 3.5–5.1)
Sodium: 134 mEq/L — ABNORMAL LOW (ref 135–145)

## 2017-10-09 NOTE — Addendum Note (Signed)
Addended by: Glori Luis on: 10/09/2017 04:45 PM   Modules accepted: Orders

## 2017-10-09 NOTE — Progress Notes (Signed)
Blood pressure adequately controlled off of lisinopril.  Potassium has trended down.  Still slightly elevated.  I would suggest we recheck labs on Friday to ensure that it comes down to normal.  She should monitor her blood pressure at home and if it goes up above 130/90 consistently let us know.  She will need follow-up with me as planned in June.

## 2017-10-09 NOTE — Progress Notes (Signed)
See clinical note from 09/19/17 patient to follow up After increasing lisinopril to 40 mg and then after checking labs on 10/04/17 lisinopril was reduced due to out of range potassium level. On 10/07/17 lisinopril was DC due to potassium of 6.7, BP taken in left arm 128/70 pulse 67, BP taken in right arm 12 minutes apart 130/78 pulse 69. Patient left copy of home reading given to CMA.

## 2017-10-10 ENCOUNTER — Ambulatory Visit (INDEPENDENT_AMBULATORY_CARE_PROVIDER_SITE_OTHER): Payer: Medicare HMO | Admitting: Vascular Surgery

## 2017-10-10 ENCOUNTER — Encounter (INDEPENDENT_AMBULATORY_CARE_PROVIDER_SITE_OTHER): Payer: Self-pay | Admitting: Vascular Surgery

## 2017-10-10 VITALS — BP 145/61 | HR 66 | Resp 13 | Ht 63.0 in | Wt 135.0 lb

## 2017-10-10 DIAGNOSIS — E118 Type 2 diabetes mellitus with unspecified complications: Secondary | ICD-10-CM | POA: Diagnosis not present

## 2017-10-10 DIAGNOSIS — I739 Peripheral vascular disease, unspecified: Secondary | ICD-10-CM | POA: Diagnosis not present

## 2017-10-10 DIAGNOSIS — E782 Mixed hyperlipidemia: Secondary | ICD-10-CM | POA: Diagnosis not present

## 2017-10-10 DIAGNOSIS — I10 Essential (primary) hypertension: Secondary | ICD-10-CM

## 2017-10-10 NOTE — Progress Notes (Signed)
Patient and patient DPR notified of results and message lab scheduled for Monday at 11:30

## 2017-10-10 NOTE — Progress Notes (Signed)
Subjective:    Patient ID: Crystal Miller, female    DOB: February 04, 1946, 72 y.o.   MRN: [id number] Chief Complaint  Patient presents with  . New Patient (Initial Visit)    Abnormal ABI's   Presents as a new patient referred by Dr. [name] for abnormal ABI and decreased bilateral lower extremity pedal pulses.  Patient presents today with an aide.  Patient denies any claudication-like symptoms, rest pain or ulceration to the bilateral lower extremity.  The patient's chief complaint is weakness to the left lower extremity.  Patient notes having a "stroke" in the past.  Patient also experiences some tingling to the left foot.  Patient denies any recent surgery or trauma to the bilateral lower extremity.  The patient underwent a bilateral ABI which was notable for right lower extremity with mild arterial disease Doppler waveforms suggest arterial occlusive disease starting at the tibial artery level.  Left lower extremity indicates moderate left lower extremity arterial disease Doppler waveforms suggest infrainguinal level arterial disease.  Patient denies any fever, nausea vomiting.  Review of Systems  Constitutional: Negative.   HENT: Negative.   Eyes: Negative.   Respiratory: Negative.   Cardiovascular: Negative.   Gastrointestinal: Negative.   Endocrine: Negative.   Genitourinary: Negative.   Musculoskeletal: Negative.   Skin: Negative.   Allergic/Immunologic: Negative.   Neurological: Positive for weakness.  Hematological: Negative.   Psychiatric/Behavioral: Negative.       Objective:   Physical Exam  Constitutional: She is oriented to person, place, and time. She appears well-developed and well-nourished. No distress.  HENT:  Head: Normocephalic and atraumatic.  Right Ear: External ear normal.  Left Ear: External ear normal.  Eyes: Pupils are equal, round, and reactive to light. Conjunctivae and EOM are normal.  Neck: Normal range of motion.  Cardiovascular: Normal rate, regular  rhythm, normal heart sounds and intact distal pulses.  Pulses:      Radial pulses are 2+ on the right side, and 2+ on the left side.  Hard to palpate pedal pulses however the bilateral feet are warm  Pulmonary/Chest: Effort normal and breath sounds normal.  Musculoskeletal: Normal range of motion. She exhibits no edema.  Neurological: She is alert and oriented to person, place, and time.  Skin: Skin is warm and dry. She is not diaphoretic.  Psychiatric: She has a normal mood and affect. Her behavior is normal. Judgment and thought content normal.  Vitals reviewed.  BP (!) 145/61 (BP Location: Left Arm, Patient Position: Sitting)   Pulse 66   Resp 13   Ht '5\' 3"'  (1.6 m)   Wt 135 lb (61.2 kg)   BMI 23.91 kg/m   Past Medical History:  Diagnosis Date  . DM (diabetes mellitus), type 2 with complications ([place]) 5/63/8756  . Essential hypertension 11/01/2016  . Mixed hyperlipidemia 11/01/2016  . Stroke [facility])    Social History   Socioeconomic History  . Marital status: Widowed    Spouse name: Not on file  . Number of children: Not on file  . Years of education: Not on file  . Highest education level: Not on file  Occupational History  . Not on file  Social Needs  . Financial resource strain: Not on file  . Food insecurity:    Worry: Not on file    Inability: Not on file  . Transportation needs:    Medical: Not on file    Non-medical: Not on file  Tobacco Use  . Smoking status: Former Smoker  Years: 20.00    Types: Cigarettes  . Smokeless tobacco: Never Used  Substance and Sexual Activity  . Alcohol use: No  . Drug use: No  . Sexual activity: Never  Lifestyle  . Physical activity:    Days per week: Not on file    Minutes per session: Not on file  . Stress: Not on file  Relationships  . Social connections:    Talks on phone: Not on file    Gets together: Not on file    Attends religious service: Not on file    Active member of club or organization: Not on file     Attends meetings of clubs or organizations: Not on file    Relationship status: Not on file  . Intimate partner violence:    Fear of current or ex partner: Not on file    Emotionally abused: Not on file    Physically abused: Not on file    Forced sexual activity: Not on file  Other Topics Concern  . Not on file  Social History Narrative  . Not on file   Past Surgical History:  Procedure Laterality Date  . CHOLECYSTECTOMY     Family History  Problem Relation Age of Onset  . Alzheimer's disease Mother   . Arthritis Mother   . Hypertension Mother   . CVA Father   . CAD Father   . Heart disease Father   . Stroke Father   . Hypertension Father   . Breast cancer Neg Hx    Allergies  Allergen Reactions  . Metformin And Related Diarrhea      Assessment & Plan:  Presents as a new patient referred by Dr. [name] for abnormal ABI and decreased bilateral lower extremity pedal pulses.  Patient presents today with an aide.  Patient denies any claudication-like symptoms, rest pain or ulceration to the bilateral lower extremity.  The patient's chief complaint is weakness to the left lower extremity.  Patient notes having a "stroke" in the past.  Patient also experiences some tingling to the left foot.  Patient denies any recent surgery or trauma to the bilateral lower extremity.  The patient underwent a bilateral ABI which was notable for right lower extremity with mild arterial disease Doppler waveforms suggest arterial occlusive disease starting at the tibial artery level.  Left lower extremity indicates moderate left lower extremity arterial disease Doppler waveforms suggest infrainguinal level arterial disease.  Patient denies any fever, nausea vomiting.  1. PAD (peripheral artery disease) ([place]) - New Patient with mild peripheral artery disease to the right lower extremity Patient with moderate peripheral artery disease to the left lower extremity Patient's symptoms are mostly located  to the left lower extremity.  She describes them as weakness to the extremity and tingling to the left foot. I had a long discussion with the patient about peripheral artery disease and how it is a progressive disease. I recommend the patient undergo left lower extremity angiogram with possible intervention to assess the patient's exact degree of peripheral artery disease and if appropriate at that time revascularization of the leg can be performed. At this time, the patient is not ready to move forward with the procedure. I will see the patient back in 6 months and have her undergo an aortoiliac/ABI to surveilled her peripheral artery disease If the patient's symptoms worsen in the next 6 months she is to call the office to move forward with the angiogram Patient is in agreement I have discussed with the patient  at length the risk factors for and pathogenesis of atherosclerotic disease and encouraged a healthy diet, regular exercise regimen and blood pressure / glucose control.  The patient was encouraged to call the office in the interim if he experiences any claudication like symptoms, rest pain or ulcers to his feet / toes.  - VAS [place] ABI WITH/WO TBI; Future - VAS US AORTA/IVC/ILIACS; Future  2. Mixed hyperlipidemia - Stable Encouraged good control as its slows the progression of atherosclerotic disease  3. Type 2 diabetes mellitus with complication, without long-term current use of insulin (HCC) - Stable Encouraged good control as its slows the progression of atherosclerotic disease  4. Essential hypertension - Stable Encouraged good control as its slows the progression of atherosclerotic disease  Current Outpatient Medications on File Prior to Visit  Medication Sig Dispense Refill  . alendronate (FOSAMAX) 70 MG tablet Take 1 tablet (70 mg total) by mouth every 7 (seven) days. Take with a full glass of water on an empty stomach. 4 tablet 11  . amLODipine (NORVASC) 5 MG tablet Take 1  tablet (5 mg total) by mouth daily. (Patient taking differently: Take 10 mg by mouth daily. ) 90 tablet 3  . aspirin EC 81 MG EC tablet Take 1 tablet (81 mg total) by mouth daily. 30 tablet 2  . atorvastatin (LIPITOR) 40 MG tablet take 1 tablet by mouth once daily AT 6PM 30 tablet 2  . blood glucose meter kit and supplies KIT Dispense based on patient and insurance preference. Use up to four times daily as directed. (FOR ICD-9 250.00, 250.01). 1 each 0  . sertraline (ZOLOFT) 25 MG tablet Take 1 tablet (25 mg total) by mouth daily. 90 tablet 1  . lisinopril (PRINIVIL,ZESTRIL) 20 MG tablet Take by mouth.    . metFORMIN (GLUCOPHAGE) 500 MG tablet Take by mouth.     No current facility-administered medications on file prior to visit.    There are no Patient Instructions on file for this visit. No follow-ups on file.  Jamieka Royle A Rima Blizzard, PA-C

## 2017-10-15 ENCOUNTER — Other Ambulatory Visit (INDEPENDENT_AMBULATORY_CARE_PROVIDER_SITE_OTHER): Payer: Medicare HMO

## 2017-10-15 ENCOUNTER — Ambulatory Visit: Payer: Medicare HMO

## 2017-10-15 VITALS — BP 146/62

## 2017-10-15 DIAGNOSIS — E875 Hyperkalemia: Secondary | ICD-10-CM | POA: Diagnosis not present

## 2017-10-15 DIAGNOSIS — I10 Essential (primary) hypertension: Secondary | ICD-10-CM

## 2017-10-15 LAB — BASIC METABOLIC PANEL
BUN: 28 mg/dL — AB (ref 6–23)
CHLORIDE: 106 meq/L (ref 96–112)
CO2: 24 meq/L (ref 19–32)
CREATININE: 1.08 mg/dL (ref 0.40–1.20)
Calcium: 9.2 mg/dL (ref 8.4–10.5)
GFR: 52.82 mL/min — ABNORMAL LOW (ref >60.00)
Glucose, Bld: 219 mg/dL — ABNORMAL HIGH (ref 70–99)
Potassium: 4.6 mEq/L (ref 3.5–5.1)
Sodium: 137 mEq/L (ref 135–145)

## 2017-10-15 NOTE — Progress Notes (Signed)
Pt presents today for blood pressure check. Blood pressure in office today was 146/62 left arm with a pulse of 77. Pt is currently taking Amlodipine  daily. Pt was told to discontinue the Lisinopril due to elevated potassium level.

## 2017-10-15 NOTE — Progress Notes (Signed)
BP seems to be adequately controlled. Please see if she has been checking it at home and what her numbers have been. She should continue with the amlodipine.

## 2017-10-16 ENCOUNTER — Ambulatory Visit: Payer: Medicare HMO

## 2017-10-21 ENCOUNTER — Other Ambulatory Visit: Payer: Self-pay | Admitting: Family Medicine

## 2017-10-21 DIAGNOSIS — Z1231 Encounter for screening mammogram for malignant neoplasm of breast: Secondary | ICD-10-CM

## 2017-10-30 ENCOUNTER — Ambulatory Visit (INDEPENDENT_AMBULATORY_CARE_PROVIDER_SITE_OTHER): Payer: Medicare HMO | Admitting: Family Medicine

## 2017-10-30 ENCOUNTER — Ambulatory Visit (INDEPENDENT_AMBULATORY_CARE_PROVIDER_SITE_OTHER): Payer: Medicare HMO

## 2017-10-30 ENCOUNTER — Encounter: Payer: Self-pay | Admitting: Family Medicine

## 2017-10-30 ENCOUNTER — Other Ambulatory Visit: Payer: Self-pay | Admitting: Family Medicine

## 2017-10-30 VITALS — BP 144/70 | HR 69 | Temp 97.9°F | Ht 63.0 in | Wt 139.0 lb

## 2017-10-30 VITALS — BP 144/70 | HR 69 | Resp 13 | Ht 63.0 in | Wt 139.0 lb

## 2017-10-30 DIAGNOSIS — E118 Type 2 diabetes mellitus with unspecified complications: Secondary | ICD-10-CM

## 2017-10-30 DIAGNOSIS — R5383 Other fatigue: Secondary | ICD-10-CM | POA: Diagnosis not present

## 2017-10-30 DIAGNOSIS — M25551 Pain in right hip: Secondary | ICD-10-CM | POA: Insufficient documentation

## 2017-10-30 DIAGNOSIS — Z0000 Encounter for general adult medical examination without abnormal findings: Secondary | ICD-10-CM

## 2017-10-30 DIAGNOSIS — I10 Essential (primary) hypertension: Secondary | ICD-10-CM | POA: Diagnosis not present

## 2017-10-30 DIAGNOSIS — R8290 Unspecified abnormal findings in urine: Secondary | ICD-10-CM

## 2017-10-30 DIAGNOSIS — R350 Frequency of micturition: Secondary | ICD-10-CM | POA: Insufficient documentation

## 2017-10-30 DIAGNOSIS — E782 Mixed hyperlipidemia: Secondary | ICD-10-CM | POA: Diagnosis not present

## 2017-10-30 DIAGNOSIS — Z23 Encounter for immunization: Secondary | ICD-10-CM

## 2017-10-30 DIAGNOSIS — I739 Peripheral vascular disease, unspecified: Secondary | ICD-10-CM

## 2017-10-30 LAB — TSH: TSH: 4.41 u[IU]/mL (ref 0.35–4.50)

## 2017-10-30 LAB — CBC
HEMATOCRIT: 35.8 % — AB (ref 36.0–46.0)
HEMOGLOBIN: 12.1 g/dL (ref 12.0–15.0)
MCHC: 33.9 g/dL (ref 30.0–36.0)
MCV: 90.4 fl (ref 78.0–100.0)
PLATELETS: 326 10*3/uL (ref 150.0–400.0)
RBC: 3.96 Mil/uL (ref 3.87–5.11)
RDW: 13 % (ref 11.5–15.5)
WBC: 8 10*3/uL (ref 4.0–10.5)

## 2017-10-30 LAB — VITAMIN B12: Vitamin B-12: >1500 pg/mL — ABNORMAL HIGH (ref 211–911)

## 2017-10-30 LAB — COMPREHENSIVE METABOLIC PANEL
ALBUMIN: 4.3 g/dL (ref 3.5–5.2)
ALK PHOS: 71 U/L (ref 39–117)
ALT: 35 U/L (ref 0–35)
AST: 26 U/L (ref 0–37)
BILIRUBIN TOTAL: 0.4 mg/dL (ref 0.2–1.2)
BUN: 33 mg/dL — AB (ref 6–23)
CO2: 26 mEq/L (ref 19–32)
Calcium: 10.1 mg/dL (ref 8.4–10.5)
Chloride: 103 mEq/L (ref 96–112)
Creatinine, Ser: 1.17 mg/dL (ref 0.40–1.20)
GFR: 48.15 mL/min — AB (ref >60.00)
GLUCOSE: 162 mg/dL — AB (ref 70–99)
POTASSIUM: 5.4 meq/L — AB (ref 3.5–5.1)
Sodium: 135 mEq/L (ref 135–145)
TOTAL PROTEIN: 8.1 g/dL (ref 6.0–8.3)

## 2017-10-30 LAB — URINALYSIS, MICROSCOPIC ONLY

## 2017-10-30 LAB — POCT URINALYSIS DIPSTICK
BILIRUBIN UA: NEGATIVE
GLUCOSE UA: NEGATIVE
Ketones, UA: NEGATIVE
Nitrite, UA: NEGATIVE
Protein, UA: POSITIVE — AB
Spec Grav, UA: 1.015 (ref 1.010–1.025)
Urobilinogen, UA: 0.2 E.U./dL
pH, UA: 5 (ref 5.0–8.0)

## 2017-10-30 LAB — POCT GLYCOSYLATED HEMOGLOBIN (HGB A1C): Hemoglobin A1C: 7 % — AB (ref 4.0–5.6)

## 2017-10-30 NOTE — Progress Notes (Signed)
Tommi Rumps, MD Phone: [phone]  Crystal Miller is a 72 y.o. female who presents today for f/u.  CC: dm, htn, hld, right hip bone pain  HYPERTENSION Disease Monitoring: Blood pressure range-130s-150s/60s-70s Chest pain- no      Dyspnea- no Medications: Compliance- taking amlodipine   Edema- no  DIABETES Disease Monitoring: Blood Sugar ranges-138-167 Polyuria/phagia/dipsia- some polyuria  Optho- needs to see Medications: no meds She walks daily.  She is not eating as well as pasta or bread.  HYPERLIPIDEMIA Disease Monitoring: See symptoms for Hypertension Medications: Compliance- takin glipitor Right upper quadrant pain- no  Muscle aches- no  Patient reports for the last week or so she has had discomfort over her right anterior pelvic bone.  There is no abdominal discomfort or hip discomfort.  She notes this area bothers her when her bladder gets full.  She is had frequency and urgency.  No dysuria.  No fevers.  No vaginal discharge.  No abdominal surgery history.  She is going to have a procedure for her peripheral artery disease.  Is not yet scheduled.  Social History   Tobacco Use  Smoking Status Former Smoker  . Years: 20.00  . Types: Cigarettes  Smokeless Tobacco Never Used     ROS see history of present illness  Objective  Physical Exam Vitals:   10/30/17 1004 10/30/17 1025  BP: (!) 160/78 (!) 144/70  Pulse: 69   Temp: 97.9 F (36.6 C)   SpO2: 98%     BP Readings from Last 3 Encounters:  10/30/17 (!) 144/70  10/30/17 (!) 144/70  10/15/17 (!) 146/62   Wt Readings from Last 3 Encounters:  10/30/17 139 lb (63 kg)  10/30/17 139 lb (63 kg)  10/10/17 135 lb (61.2 kg)    Physical Exam  Constitutional: No distress.  Cardiovascular: Normal rate, regular rhythm and normal heart sounds.  Pulmonary/Chest: Effort normal and breath sounds normal.  Abdominal: Soft. Bowel sounds are normal. She exhibits no distension. There is no tenderness.    Musculoskeletal: She exhibits no edema.  Decreased internal and external range of motion right hip with no discomfort, there is no tenderness over the right hip anteriorly or over the anterior right pelvic bones   Neurological: She is alert.  Skin: Skin is warm and dry. She is not diaphoretic.     Assessment/Plan: Please see individual problem list.  Essential hypertension Not quite as controlled as I would like based on her stroke history.  I discussed starting on HCTZ though she is concerned about her kidneys with this.  I will discuss further with our clinical pharmacist to determine if HCTZ would be appropriate or if we need to go with a beta-blocker.  He will continue amlodipine.  DM (diabetes mellitus), type 2 with complications (HCC) Check A1c.  Continue to stay active.  Mixed hyperlipidemia Continue Lipitor.  Right hip pain I suspect the discomfort that she is having is likely coming from her right hip though could be a bony pelvis issue or related to her urinary issues.  We will check an x-ray today.  Also check a urinalysis.  Urine frequency Could be diabetes related.  Could be UTI.  We will check a urinalysis.  PAD (peripheral artery disease) ([place]) She will continue to see vascular surgery.  Patient was seeing our health coach and noted some fatigue since having her stroke previously.  I suspect this is likely related to the stroke.  We will check lab work as outlined below to evaluate for  other causes.  Orders Placed This Encounter  Procedures  . DG HIP UNILAT WITH PELVIS 2-3 VIEWS RIGHT    Standing Status:   Future    Number of Occurrences:   1    Standing Expiration Date:   12/31/2018    Order Specific Question:   Reason for Exam (SYMPTOM  OR DIAGNOSIS REQUIRED)    Answer:   right hip pain and pain over ASIS    Order Specific Question:   Preferred imaging location?    Answer:   [facility] Specific Question:   Radiology Contrast Protocol -  do NOT remove file path    Answer:   \\[email or web address].pdf  . CBC  . Comp Met (CMET)  . TSH  . B12  . POCT Urinalysis Dipstick  . POCT HgB A1C    No orders of the defined types were placed in this encounter.    Tommi Rumps, MD Standard

## 2017-10-30 NOTE — Assessment & Plan Note (Signed)
Could be diabetes related.  Could be UTI.  We will check a urinalysis.

## 2017-10-30 NOTE — Assessment & Plan Note (Signed)
She will continue to see vascular surgery. 

## 2017-10-30 NOTE — Progress Notes (Signed)
Subjective:   Crystal Miller is a 72 y.o. female who presents for an Initial Medicare Annual Wellness Visit.  Review of Systems    No ROS.  Medicare Wellness Visit. Additional risk factors are reflected in the social history.  Cardiac Risk Factors include: advanced age (>76mn, >>38women);hypertension;diabetes mellitus     Objective:    Today's Vitals   10/30/17 1100  BP: (!) 144/70  Pulse: 69  Resp: 13  SpO2: 98%  Weight: 139 lb (63 kg)  Height: '5\' 3"'  (1.6 m)   Body mass index is 24.62 kg/m.  Advanced Directives 10/30/2017 03/14/2017 01/01/2017 10/04/2016 10/01/2016 10/01/2016  Does Patient Have a Medical Advance Directive? Yes Yes Yes No No No  Type of [place] DNR (pink MOST or yellow form) [place] will - - -  Does patient want to make changes to medical advance directive? No - Patient declined - - - - -  Copy of [place] Chart? No - copy requested Yes Yes - - -  Would patient like information on creating a medical advance directive? - - - No - Patient declined Yes (Inpatient - patient requests chaplain consult to create a medical advance directive) No - Patient declined    Current Medications (verified) Outpatient Encounter Medications as of 10/30/2017  Medication Sig  . alendronate (FOSAMAX) 70 MG tablet Take 1 tablet (70 mg total) by mouth every 7 (seven) days. Take with a full glass of water on an empty stomach.  .Marland KitchenamLODipine (NORVASC) 5 MG tablet Take 1 tablet (5 mg total) by mouth daily. (Patient taking differently: Take 10 mg by mouth daily. )  . aspirin EC 81 MG EC tablet Take 1 tablet (81 mg total) by mouth daily.  .Marland Kitchenatorvastatin (LIPITOR) 40 MG tablet take 1 tablet by mouth once daily AT 6PM  . blood glucose meter kit and supplies KIT Dispense based on patient and insurance preference. Use up to four times daily as directed.  (FOR ICD-9 250.00, 250.01).  . Cyanocobalamin (VITAMIN B 12 PO) Take by mouth.  . sertraline (ZOLOFT) 25 MG tablet Take 1 tablet (25 mg total) by mouth daily.   No facility-administered encounter medications on file as of 10/30/2017.     Allergies (verified) Metformin and related   History: Past Medical History:  Diagnosis Date  . DM (diabetes mellitus), type 2 with complications ([place] 60/16/0109 . Essential hypertension 11/01/2016  . Mixed hyperlipidemia 11/01/2016  . Stroke ([facility]    Past Surgical History:  Procedure Laterality Date  . CHOLECYSTECTOMY     Family History  Problem Relation Age of Onset  . Alzheimer's disease Mother   . Arthritis Mother   . Hypertension Mother   . CVA Father   . CAD Father   . Heart disease Father   . Stroke Father   . Hypertension Father   . Stroke Brother   . Breast cancer Neg Hx    Social History   Socioeconomic History  . Marital status: Widowed    Spouse name: Not on file  . Number of children: Not on file  . Years of education: Not on file  . Highest education level: Not on file  Occupational History  . Not on file  Social Needs  . Financial resource strain: Not hard at all  . Food insecurity:    Worry: Never true    Inability: Never true  .  Transportation needs:    Medical: No    Non-medical: No  Tobacco Use  . Smoking status: Former Smoker    Years: 20.00    Types: Cigarettes  . Smokeless tobacco: Never Used  Substance and Sexual Activity  . Alcohol use: No  . Drug use: No  . Sexual activity: Never  Lifestyle  . Physical activity:    Days per week: Not on file    Minutes per session: Not on file  . Stress: Not on file  Relationships  . Social connections:    Talks on phone: Not on file    Gets together: Not on file    Attends religious service: Not on file    Active member of club or organization: Not on file    Attends meetings of clubs or organizations: Not on file    Relationship status: Not on file    Other Topics Concern  . Not on file  Social History Narrative  . Not on file    Tobacco Counseling Counseling given: Not Answered   Clinical Intake:  Pre-visit preparation completed: Yes        Nutritional Status: BMI of 19-24  Normal Diabetes: No  How often do you need to have someone help you when you read instructions, pamphlets, or other written materials from your doctor or pharmacy?: 2 - Rarely  Interpreter Needed?: No      Activities of Daily Living In your present state of health, do you have any difficulty performing the following activities: 10/30/2017  Hearing? Y  Comment Hard of hearing.  She does not wear her hearing aids.  Vision? N  Difficulty concentrating or making decisions? N  Walking or climbing stairs? Y  Comment Unsteady gait.  Rolling walker in use.   Dressing or bathing? N  Doing errands, shopping? Y  Comment She does not drive.  Preparing Food and eating ? Y  Using the Toilet? N  In the past six months, have you accidently leaked urine? Y  Do you have problems with loss of bowel control? N  Managing your Medications? N  Managing your Finances? N  Housekeeping or managing your Housekeeping? N  Some recent data might be hidden     Immunizations and Health Maintenance Immunization History  Administered Date(s) Administered  . Pneumococcal Conjugate-13 10/30/2017  . Pneumococcal Polysaccharide-23 10/02/2016   Health Maintenance Due  Topic Date Due  . OPHTHALMOLOGY EXAM  07/21/1955  . URINE MICROALBUMIN  07[birth year]21/1954  . TETANUS/TDAP  07/21/1963  . PNA vac Low Risk Adult (2 of 2 - PCV13) 10/02/2017    Patient Care Team: [name], MD as PCP - General (Family Medicine)  Indicate any recent Medical Services you may have received from other than [facility] providers in the past year (date may be approximate).     Assessment:   This is a routine wellness examination for [place].  The goal of the wellness visit is to assist the  patient how to close the gaps in care and create a preventative care plan for the patient.   The roster of all physicians providing medical care to patient is listed in the Snapshot section of the chart.  Osteoporosis reviewed.    Safety issues reviewed; Lives with daughter. Smoke and carbon monoxide detectors in the home. No firearms in the home. Wears seatbelts when riding with others. No violence in the home.  They do not have excessive sun exposure.  Discussed the need for sun protection: hats, long sleeves  and the use of sunscreen if there is significant sun exposure.  Oriented to person, place, time.  Can read and draw the time from watch face. Correctly identified the president of [place].  Performed calculations correctly. Recall 3/3 words. Displays appropriate judgement.  No new identified risk were noted.  No failures at ADL's or IADL's.  Ambulates with rolling walker.   BMI- discussed the importance of a healthy diet, water intake and the benefits of aerobic exercise. Educational material provided.   24 hour diet recall: Low carb diet  Dental- dentures.   Eye- Visual acuity not assessed per patient preference since they have regular follow up with the ophthalmologist.  Wears corrective lenses.  Sleep patterns- Sleeps during the night without issues.  hours at night.   Prevnar 13 administered R deltoid, tolerated well. Educational material provided.  Patient Concerns: She stated she has been fatigued since the stroke.  Consulted pcp.  Labs ordered. She is to follow up with pcp as directed.   Hearing/Vision screen Hearing Screening Comments: Hard of hearing.   She has hearing aids but does not wear them.  Vision Screening Comments: Followed by [facility] Wears corrective lenses Annual visits Visual acuity not assessed per patient preference since they have regular follow up with the ophthalmologist  Dietary issues and exercise activities discussed: Current  Exercise Habits: Home exercise routine, Type of exercise: walking, Time (Minutes): 30, Frequency (Times/Week): 4, Weekly Exercise (Minutes/Week): 120, Intensity: Mild  Goals    . DIET - INCREASE LEAN PROTEINS     Low carb diet; portion control Stay hydrated and drink plenty of fluids     . Increase physical activity     Walk for exercise      Depression Screen PHQ 2/9 Scores 10/30/2017 11/01/2016  PHQ - 2 Score 0 3  PHQ- 9 Score - 6    Fall Risk Fall Risk  10/30/2017 12/04/2016  Falls in the past year? No No   Cognitive Function:     6CIT Screen 10/30/2017  What Year? 0 points  What month? 0 points  What time? 0 points  Count back from 20 0 points  Months in reverse 0 points  Repeat phrase 0 points  Total Score 0    Screening Tests Health Maintenance  Topic Date Due  . OPHTHALMOLOGY EXAM  07/21/1955  . URINE MICROALBUMIN  07/21/1954  . TETANUS/TDAP  07/21/1963  . PNA vac Low Risk Adult (2 of 2 - PCV13) 10/02/2017  . INFLUENZA VACCINE  03/06/2018 (Originally 12/19/2017)  . HEMOGLOBIN A1C  01/30/2018  . FOOT EXAM  07/31/2018  . MAMMOGRAM  11/08/2018  . Fecal DNA (Cologuard)  11/15/2019  . DEXA SCAN  Completed     Plan:    End of life planning; Advance aging; Advanced directives discussed. Copy of current DNR on file.    I have personally reviewed and noted the following in the patient's chart:   . Medical and social history . Use of alcohol, tobacco or illicit drugs  . Current medications and supplements . Functional ability and status . Nutritional status . Physical activity . Advanced directives . List of other physicians . Hospitalizations, surgeries, and ER visits in previous 12 months . Vitals . Screenings to include cognitive, depression, and falls . Referrals and appointments  In addition, I have reviewed and discussed with patient certain preventive protocols, quality metrics, and best practice recommendations. A written personalized care plan for  preventive services as well as general preventive health recommendations  were provided to patient.     Varney Biles, LPN   9/92/3414

## 2017-10-30 NOTE — Patient Instructions (Addendum)
  [name] , Thank you for taking time to come for your Medicare Wellness Visit. I appreciate your ongoing commitment to your health goals. Please review the following plan we discussed and let me know if I can assist you in the future.   These are the goals we discussed: Goals    . DIET - INCREASE LEAN PROTEINS     Low carb diet; portion control Stay hydrated and drink plenty of fluids     . Increase physical activity     Walk for exercise       This is a list of the screening recommended for you and due dates:  Health Maintenance  Topic Date Due  . Eye exam for diabetics  07[birth year]21/1955  . Urine Protein Check  07[birth year]21/1954  . Tetanus Vaccine  07/21/1963  . Pneumonia vaccines (2 of 2 - PCV13) 10/02/2017  . Flu Shot  03/06/2018*  . Hemoglobin A1C  01/30/2018  . Complete foot exam   07/31/2018  . Mammogram  11/08/2018  . Cologuard (Stool DNA test)  11/15/2019  . DEXA scan (bone density measurement)  Completed  *Topic was postponed. The date shown is not the original due date.

## 2017-10-30 NOTE — Addendum Note (Signed)
Addended by: Penne LashWIGGINS, Kimberely Mccannon N on: 10/30/2017 12:47 PM   Modules accepted: Orders

## 2017-10-30 NOTE — Assessment & Plan Note (Signed)
I suspect the discomfort that she is having is likely coming from her right hip though could be a bony pelvis issue or related to her urinary issues.  We will check an x-ray today.  Also check a urinalysis.

## 2017-10-30 NOTE — Patient Instructions (Signed)
Nice to see you. Please continue to stay active and monitor what you eat. We will get an x-ray and check your urine and contact you with the results.

## 2017-10-30 NOTE — Assessment & Plan Note (Signed)
-  Continue Lipitor °

## 2017-10-30 NOTE — Assessment & Plan Note (Signed)
Not quite as controlled as I would like based on her stroke history.  I discussed starting on HCTZ though she is concerned about her kidneys with this.  I will discuss further with our clinical pharmacist to determine if HCTZ would be appropriate or if we need to go with a beta-blocker.  He will continue amlodipine.

## 2017-10-30 NOTE — Assessment & Plan Note (Signed)
Check A1c.  Continue to stay active.

## 2017-11-01 ENCOUNTER — Other Ambulatory Visit: Payer: Self-pay | Admitting: Family Medicine

## 2017-11-01 LAB — URINE CULTURE
MICRO NUMBER: 90705315
SPECIMEN QUALITY:: ADEQUATE

## 2017-11-01 MED ORDER — CEPHALEXIN 500 MG PO CAPS: 500.0000 mg | ORAL_CAPSULE | Freq: Two times a day (BID) | ORAL | 0 refills | Status: DC

## 2017-11-03 ENCOUNTER — Other Ambulatory Visit: Payer: Self-pay | Admitting: Family Medicine

## 2017-11-04 ENCOUNTER — Telehealth: Payer: Self-pay

## 2017-11-04 DIAGNOSIS — E875 Hyperkalemia: Secondary | ICD-10-CM

## 2017-11-04 NOTE — Telephone Encounter (Signed)
-----   Message from Glori LuisEric G Sonnenberg, MD sent at 11/01/2017  5:00 PM EDT ----- Noted.  We should plan on rechecking her potassium early next week.  Please place an order for potassium for hyperkalemia.  Thanks.

## 2017-11-06 ENCOUNTER — Other Ambulatory Visit (INDEPENDENT_AMBULATORY_CARE_PROVIDER_SITE_OTHER): Payer: Medicare HMO

## 2017-11-06 DIAGNOSIS — E875 Hyperkalemia: Secondary | ICD-10-CM | POA: Diagnosis not present

## 2017-11-06 LAB — POTASSIUM: POTASSIUM: 4.8 meq/L (ref 3.5–5.1)

## 2017-11-08 ENCOUNTER — Ambulatory Visit
Admission: RE | Admit: 2017-11-08 | Discharge: 2017-11-08 | Disposition: A | Discharge status: Home / Self Care | Payer: Medicare HMO | Source: Ambulatory Visit | Attending: Family Medicine | Admitting: Family Medicine

## 2017-11-08 ENCOUNTER — Other Ambulatory Visit: Payer: Self-pay | Admitting: Family Medicine

## 2017-11-08 DIAGNOSIS — Z1231 Encounter for screening mammogram for malignant neoplasm of breast: Secondary | ICD-10-CM

## 2017-11-20 ENCOUNTER — Encounter (INDEPENDENT_AMBULATORY_CARE_PROVIDER_SITE_OTHER): Payer: Self-pay

## 2017-12-23 ENCOUNTER — Other Ambulatory Visit (INDEPENDENT_AMBULATORY_CARE_PROVIDER_SITE_OTHER): Payer: Self-pay | Admitting: Vascular Surgery

## 2017-12-25 ENCOUNTER — Telehealth: Payer: Self-pay | Admitting: Family Medicine

## 2017-12-25 NOTE — Telephone Encounter (Signed)
Request has been sent to PCP for verification.

## 2017-12-25 NOTE — Telephone Encounter (Unsigned)
Copied from CRM [id number]. Topic: Quick Communication - See Telephone Encounter >> Dec 25, 2017  8:42 AM Crystal AmatoBurton, Donna F wrote: Pt is needing a refill on amlodipine 10 mg pt states that the provider has increased the dosage but it is not reflected at the pharmacy  [facility] [address]  Best number [phone]

## 2017-12-25 NOTE — Telephone Encounter (Signed)
PER PCP note nurse visit dated 10/15/17 dose for amlodipine is 10 mg daily ok to send script?

## 2017-12-25 NOTE — Telephone Encounter (Signed)
Copied from CRM #[id number]. Topic: Quick Communication - See Telephone Encounter °>> Dec 25, 2017  8:42 AM Burton, Crystal Miller wrote: °Pt is needing a refill on amlodipine 10 mg pt states that the provider has increased the dosage but it is not reflected at the pharmacy  °[facility] [address] ° °Best number [phone] °

## 2017-12-25 NOTE — Telephone Encounter (Signed)
Pt requesting refill of amlodipine 10 mg. Dose increased from 5mg  to 10mg  on  07/30/17.  Pt states increase is not reflected at the pharmacy. [facility] [address]  Best number [phone]

## 2017-12-26 MED ORDER — AMLODIPINE BESYLATE 10 MG PO TABS: 10.0000 mg | ORAL_TABLET | Freq: Every day | ORAL | 3 refills | Status: DC

## 2017-12-26 NOTE — Telephone Encounter (Signed)
Corrected script sent to pharmacy and patient notified per DPR with detailed message left on voicemail.

## 2017-12-26 NOTE — Telephone Encounter (Signed)
It is ok to send in the current dose of amlodipine 10 mg daily. Thanks.

## 2017-12-30 ENCOUNTER — Ambulatory Visit (INDEPENDENT_AMBULATORY_CARE_PROVIDER_SITE_OTHER): Payer: Medicare HMO | Admitting: Vascular Surgery

## 2017-12-30 ENCOUNTER — Encounter
Admission: RE | Admit: 2017-12-30 | Discharge: 2017-12-30 | Disposition: A | Discharge status: Home / Self Care | Payer: Medicare HMO | Source: Ambulatory Visit | Attending: Vascular Surgery | Admitting: Vascular Surgery

## 2017-12-30 ENCOUNTER — Encounter (INDEPENDENT_AMBULATORY_CARE_PROVIDER_SITE_OTHER): Payer: Self-pay | Admitting: Vascular Surgery

## 2017-12-30 ENCOUNTER — Other Ambulatory Visit: Payer: Self-pay

## 2017-12-30 VITALS — BP 178/75 | HR 72 | Resp 17 | Ht 63.0 in | Wt 146.0 lb

## 2017-12-30 DIAGNOSIS — Z8249 Family history of ischemic heart disease and other diseases of the circulatory system: Secondary | ICD-10-CM | POA: Diagnosis not present

## 2017-12-30 DIAGNOSIS — E118 Type 2 diabetes mellitus with unspecified complications: Secondary | ICD-10-CM

## 2017-12-30 DIAGNOSIS — E782 Mixed hyperlipidemia: Secondary | ICD-10-CM

## 2017-12-30 DIAGNOSIS — Z79899 Other long term (current) drug therapy: Secondary | ICD-10-CM | POA: Diagnosis not present

## 2017-12-30 DIAGNOSIS — Z87891 Personal history of nicotine dependence: Secondary | ICD-10-CM | POA: Diagnosis not present

## 2017-12-30 DIAGNOSIS — I10 Essential (primary) hypertension: Secondary | ICD-10-CM | POA: Diagnosis not present

## 2017-12-30 DIAGNOSIS — I739 Peripheral vascular disease, unspecified: Secondary | ICD-10-CM | POA: Diagnosis not present

## 2017-12-30 DIAGNOSIS — E119 Type 2 diabetes mellitus without complications: Secondary | ICD-10-CM | POA: Diagnosis not present

## 2017-12-30 DIAGNOSIS — Z8673 Personal history of transient ischemic attack (TIA), and cerebral infarction without residual deficits: Secondary | ICD-10-CM | POA: Diagnosis not present

## 2017-12-30 DIAGNOSIS — Z7982 Long term (current) use of aspirin: Secondary | ICD-10-CM | POA: Diagnosis not present

## 2017-12-30 DIAGNOSIS — Z9049 Acquired absence of other specified parts of digestive tract: Secondary | ICD-10-CM | POA: Diagnosis not present

## 2017-12-30 DIAGNOSIS — Z888 Allergy status to other drugs, medicaments and biological substances status: Secondary | ICD-10-CM | POA: Diagnosis not present

## 2017-12-30 DIAGNOSIS — Z823 Family history of stroke: Secondary | ICD-10-CM | POA: Diagnosis not present

## 2017-12-30 DIAGNOSIS — I70212 Atherosclerosis of native arteries of extremities with intermittent claudication, left leg: Secondary | ICD-10-CM | POA: Diagnosis not present

## 2017-12-30 HISTORY — DX: Gastro-esophageal reflux disease without esophagitis: K21.9

## 2017-12-30 HISTORY — DX: Peripheral vascular disease, unspecified: I73.9

## 2017-12-30 HISTORY — DX: Depression, unspecified: F32.A

## 2017-12-30 HISTORY — DX: Major depressive disorder, single episode, unspecified: F32.9

## 2017-12-30 LAB — BUN: BUN: 24 mg/dL — ABNORMAL HIGH (ref 8–23)

## 2017-12-30 LAB — CREATININE, SERUM
Creatinine, Ser: 0.92 mg/dL (ref 0.44–1.00)
GFR calc Af Amer: >60 mL/min (ref >60)
GFR calc non Af Amer: >60 mL/min (ref >60)

## 2017-12-30 LAB — SURGICAL PCR SCREEN
MRSA, PCR: POSITIVE — AB
STAPHYLOCOCCUS AUREUS: POSITIVE — AB

## 2017-12-30 MED ORDER — CEFAZOLIN SODIUM-DEXTROSE 2-4 GM/100ML-% IV SOLN
2.0000 g | Freq: Once | INTRAVENOUS | Status: AC
  Administered 2017-12-31: 2 g via INTRAVENOUS

## 2017-12-30 NOTE — Progress Notes (Signed)
Subjective:    Patient ID: Crystal Miller, female    DOB: 1945-06-30, 71 y.o.   MRN: [id number] Chief Complaint  Patient presents with  . Follow-up   Patient last seen in May 2019 referred by Dr. [name] for decreased bilateral pulses.  ABI at the time with disease beginning at the tibial level (monophasic blood flow from the popliteal distally).  The patient complains of left lower extremity weakness tingling and burning.  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  During her last visit, the patient was not ready to move forward with an angiogram however presents today wanting to proceed.  We had a long discussion in regard to the procedure itself and possible post procedure symptoms such as reperfusion.  We also had a long discussion that the procedure would not correct her neuropathy.  We discussed how it may improve her symptoms however it will not "cure her" of her neuropathic symptoms.  The patient denies any rest pain or ulcer formation to the bilateral lower extremity.  Patient denies any recent medication/health changes.  Patient would like to proceed with the procedure at this time and feels comfortable.  Patient denies any fever, nausea vomiting.  Review of Systems  Constitutional: Negative.   HENT: Negative.   Eyes: Negative.   Respiratory: Negative.   Cardiovascular:       PAD  Gastrointestinal: Negative.   Endocrine: Negative.   Genitourinary: Negative.   Musculoskeletal: Negative.   Skin: Negative.   Allergic/Immunologic: Negative.   Neurological: Negative.   Hematological: Negative.   Psychiatric/Behavioral: Negative.       Objective:   Physical Exam  Constitutional: She is oriented to person, place, and time. She appears well-developed and well-nourished. No distress.  HENT:  Head: Normocephalic and atraumatic.  Right Ear: External ear normal.  Left Ear: External ear normal.  Eyes: Pupils are equal, round, and reactive to light.  Conjunctivae and EOM are normal.  Neck: Normal range of motion.  Cardiovascular: Normal rate, regular rhythm, normal heart sounds and intact distal pulses.  Pulses:      Radial pulses are 2+ on the right side, and 2+ on the left side.  To palpate pedal pulses bilaterally.  Pulmonary/Chest: Effort normal and breath sounds normal.  Musculoskeletal: Normal range of motion. She exhibits no edema.  Neurological: She is alert and oriented to person, place, and time.  Skin: Skin is warm and dry. She is not diaphoretic.  Psychiatric: She has a normal mood and affect. Her behavior is normal. Judgment and thought content normal.  Vitals reviewed.  BP (!) 178/75   Pulse 72   Resp 17   Ht '5\' 3"'  (1.6 m)   Wt 146 lb (66.2 kg)   BMI 25.86 kg/m   Past Medical History:  Diagnosis Date  . DM (diabetes mellitus), type 2 with complications ([place]) 08/17/5186  . Essential hypertension 11/01/2016  . Mixed hyperlipidemia 11/01/2016  . Stroke [facility])    Social History   Socioeconomic History  . Marital status: Widowed    Spouse name: Not on file  . Number of children: Not on file  . Years of education: Not on file  . Highest education level: Not on file  Occupational History  . Not on file  Social Needs  . Financial resource strain: Not hard at all  . Food insecurity:    Worry: Never true    Inability: Never true  . Transportation needs:    Medical: No  Non-medical: No  Tobacco Use  . Smoking status: Former Smoker    Years: 20.00    Types: Cigarettes  . Smokeless tobacco: Never Used  Substance and Sexual Activity  . Alcohol use: No  . Drug use: No  . Sexual activity: Never  Lifestyle  . Physical activity:    Days per week: Not on file    Minutes per session: Not on file  . Stress: Not on file  Relationships  . Social connections:    Talks on phone: Not on file    Gets together: Not on file    Attends religious service: Not on file    Active member of club or organization: Not on  file    Attends meetings of clubs or organizations: Not on file    Relationship status: Not on file  . Intimate partner violence:    Fear of current or ex partner: Not on file    Emotionally abused: Not on file    Physically abused: Not on file    Forced sexual activity: Not on file  Other Topics Concern  . Not on file  Social History Narrative  . Not on file   Past Surgical History:  Procedure Laterality Date  . CHOLECYSTECTOMY     Family History  Problem Relation Age of Onset  . Alzheimer's disease Mother   . Arthritis Mother   . Hypertension Mother   . CVA Father   . CAD Father   . Heart disease Father   . Stroke Father   . Hypertension Father   . Stroke Brother   . Breast cancer Neg Hx    Allergies  Allergen Reactions  . Metformin And Related Diarrhea      Assessment & Plan:  Patient last seen in May 2019 referred by Dr. [name] for decreased bilateral pulses.  ABI at the time with disease beginning at the tibial level (monophasic blood flow from the popliteal distally).  The patient complains of left lower extremity weakness tingling and burning.  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  During her last visit, the patient was not ready to move forward with an angiogram however presents today wanting to proceed.  We had a long discussion in regard to the procedure itself and possible post procedure symptoms such as reperfusion.  We also had a long discussion that the procedure would not correct her neuropathy.  We discussed how it may improve her symptoms however it will not "cure her" of her neuropathic symptoms.  The patient denies any rest pain or ulcer formation to the bilateral lower extremity.  Patient denies any recent medication/health changes.  Patient would like to proceed with the procedure at this time and feels comfortable.  Patient denies any fever, nausea vomiting.  1. PAD (peripheral artery disease) ([place]) - Stable Patient  last seen in May 2019 at the request of Dr. [name] for bilateral lower extremity weakness and nonpalpable pulses. Patient is most symptomatic to the left lower extremity ABI with monophasic blood flow noted from the popliteal distally to the left lower extremity At her initial visit we discussed moving forward with an angiogram in an attempt to improve her symptoms however at that time she was not ready The patient presents today with a worsening in weakness and pins-and-needles to her left foot and would like to move forward with the procedure The patient does have a past medical history of neuropathy to the bilateral feet and I did  have a long conversation with her explaining that this procedure would not completely correct those symptoms however it may improve them.  I also discussed how this procedure may improve some strength in her leg however it would not completely reverse the weakness as there are other issues contributing to the weakness in her legs. We also discussed reperfusion syndrome The patient expresses her understanding with all of the above and would like to proceed Procedure, risks and benefits explained to the patient. All questions answered She would like to proceed  2. Type 2 diabetes mellitus with complication, without long-term current use of insulin (HCC) - Stable Encouraged good control as its slows the progression of atherosclerotic disease  3. Mixed hyperlipidemia - Stable Encouraged good control as its slows the progression of atherosclerotic disease  Current Outpatient Medications on File Prior to Visit  Medication Sig Dispense Refill  . acetaminophen (TYLENOL) 500 MG tablet Take 1,000 mg by mouth every 6 (six) hours as needed (for pain.).    Marland Kitchen alendronate (FOSAMAX) 70 MG tablet Take 1 tablet (70 mg total) by mouth every 7 (seven) days. Take with a full glass of water on an empty stomach. (Patient taking differently: Take 70 mg by mouth every Sunday. Take with  a full glass of water on an empty stomach.) 4 tablet 11  . amLODipine (NORVASC) 10 MG tablet Take 1 tablet (10 mg total) by mouth daily. 90 tablet 3  . aspirin EC 81 MG EC tablet Take 1 tablet (81 mg total) by mouth daily. 30 tablet 2  . atorvastatin (LIPITOR) 40 MG tablet TAKE 1 TABLET BY MOUTH ONCE DAILY AT 6PM (Patient taking differently: TAKE 1 TABLET BY MOUTH ONCE DAILY) 90 tablet 1  . blood glucose meter kit and supplies KIT Dispense based on patient and insurance preference. Use up to four times daily as directed. (FOR ICD-9 250.00, 250.01). 1 each 0  . Multiple Vitamin (MULTIVITAMIN WITH MINERALS) TABS tablet Take 1 tablet by mouth daily. Centrum Silver    . Polyethyl Glycol-Propyl Glycol (LUBRICANT EYE DROPS) 0.4-0.3 % SOLN Place 1 drop into both eyes 3 (three) times daily as needed (for dry eyes.).    Marland Kitchen sertraline (ZOLOFT) 25 MG tablet Take 1 tablet (25 mg total) by mouth daily. 90 tablet 1  . vitamin B-12 (CYANOCOBALAMIN) 1000 MCG tablet Take 1,000 mcg by mouth daily.    . cephALEXin (KEFLEX) 500 MG capsule Take 1 capsule (500 mg total) by mouth 2 (two) times daily. (Patient not taking: Reported on 12/25/2017) 14 capsule 0   No current facility-administered medications on file prior to visit.    There are no Patient Instructions on file for this visit. No follow-ups on file.  Elinore Shults A Jex Strausbaugh, PA-C

## 2017-12-31 ENCOUNTER — Encounter: Payer: Self-pay | Admitting: *Deleted

## 2017-12-31 ENCOUNTER — Encounter
Admission: RE | Disposition: A | Discharge status: Home / Self Care | Payer: Self-pay | Source: Ambulatory Visit | Attending: Vascular Surgery

## 2017-12-31 ENCOUNTER — Ambulatory Visit
Admission: RE | Admit: 2017-12-31 | Discharge: 2017-12-31 | Disposition: A | Discharge status: Home / Self Care | Payer: Medicare HMO | Source: Ambulatory Visit | Attending: Vascular Surgery | Admitting: Vascular Surgery

## 2017-12-31 DIAGNOSIS — Z9049 Acquired absence of other specified parts of digestive tract: Secondary | ICD-10-CM | POA: Insufficient documentation

## 2017-12-31 DIAGNOSIS — Z79899 Other long term (current) drug therapy: Secondary | ICD-10-CM | POA: Insufficient documentation

## 2017-12-31 DIAGNOSIS — I70212 Atherosclerosis of native arteries of extremities with intermittent claudication, left leg: Secondary | ICD-10-CM | POA: Diagnosis not present

## 2017-12-31 DIAGNOSIS — Z87891 Personal history of nicotine dependence: Secondary | ICD-10-CM | POA: Insufficient documentation

## 2017-12-31 DIAGNOSIS — Z8673 Personal history of transient ischemic attack (TIA), and cerebral infarction without residual deficits: Secondary | ICD-10-CM | POA: Insufficient documentation

## 2017-12-31 DIAGNOSIS — I70213 Atherosclerosis of native arteries of extremities with intermittent claudication, bilateral legs: Secondary | ICD-10-CM | POA: Diagnosis not present

## 2017-12-31 DIAGNOSIS — Z823 Family history of stroke: Secondary | ICD-10-CM | POA: Insufficient documentation

## 2017-12-31 DIAGNOSIS — Z8249 Family history of ischemic heart disease and other diseases of the circulatory system: Secondary | ICD-10-CM | POA: Insufficient documentation

## 2017-12-31 DIAGNOSIS — Z888 Allergy status to other drugs, medicaments and biological substances status: Secondary | ICD-10-CM | POA: Insufficient documentation

## 2017-12-31 DIAGNOSIS — Z7982 Long term (current) use of aspirin: Secondary | ICD-10-CM | POA: Insufficient documentation

## 2017-12-31 DIAGNOSIS — E119 Type 2 diabetes mellitus without complications: Secondary | ICD-10-CM | POA: Insufficient documentation

## 2017-12-31 DIAGNOSIS — I739 Peripheral vascular disease, unspecified: Secondary | ICD-10-CM

## 2017-12-31 DIAGNOSIS — E782 Mixed hyperlipidemia: Secondary | ICD-10-CM | POA: Insufficient documentation

## 2017-12-31 DIAGNOSIS — I10 Essential (primary) hypertension: Secondary | ICD-10-CM | POA: Insufficient documentation

## 2017-12-31 HISTORY — PX: LOWER EXTREMITY ANGIOGRAPHY: CATH118251

## 2017-12-31 LAB — GLUCOSE, CAPILLARY
Glucose-Capillary: 186 mg/dL — ABNORMAL HIGH (ref 70–99)
Glucose-Capillary: 173 mg/dL — ABNORMAL HIGH (ref 70–99)

## 2017-12-31 SURGERY — LOWER EXTREMITY ANGIOGRAPHY
Anesthesia: Moderate Sedation | Laterality: Left

## 2017-12-31 MED ORDER — ATORVASTATIN CALCIUM 10 MG PO TABS
10.0000 mg | ORAL_TABLET | Freq: Every day | ORAL | Status: DC
  Filled 2017-12-31: qty 1

## 2017-12-31 MED ORDER — LIDOCAINE-EPINEPHRINE (PF) 1 %-1:200000 IJ SOLN
INTRAMUSCULAR | Status: AC
  Filled 2017-12-31: qty 30

## 2017-12-31 MED ORDER — HEPARIN SODIUM (PORCINE) 1000 UNIT/ML IJ SOLN
INTRAMUSCULAR | Status: DC | PRN
  Administered 2017-12-31: 5000 [IU] via INTRAVENOUS

## 2017-12-31 MED ORDER — LABETALOL HCL 5 MG/ML IV SOLN: 10.0000 mg | INTRAVENOUS | Status: DC | PRN

## 2017-12-31 MED ORDER — ONDANSETRON HCL 4 MG/2ML IJ SOLN
4.0000 mg | Freq: Four times a day (QID) | INTRAMUSCULAR | Status: DC | PRN
Start: 2017-12-31 — End: 2017-12-31

## 2017-12-31 MED ORDER — CLOPIDOGREL BISULFATE 75 MG PO TABS
ORAL_TABLET | ORAL | Status: AC
  Administered 2017-12-31: 300 mg via ORAL
  Filled 2017-12-31: qty 4

## 2017-12-31 MED ORDER — MORPHINE SULFATE (PF) 4 MG/ML IV SOLN: 2.0000 mg | INTRAVENOUS | Status: DC | PRN

## 2017-12-31 MED ORDER — IOPAMIDOL (ISOVUE-300) INJECTION 61%
INTRAVENOUS | Status: DC | PRN
  Administered 2017-12-31: 120 mL via INTRA_ARTERIAL

## 2017-12-31 MED ORDER — LABETALOL HCL 5 MG/ML IV SOLN
INTRAVENOUS | Status: DC | PRN
  Administered 2017-12-31: 10 mg via INTRAVENOUS

## 2017-12-31 MED ORDER — HYDRALAZINE HCL 20 MG/ML IJ SOLN: 5.0000 mg | INTRAMUSCULAR | Status: DC | PRN

## 2017-12-31 MED ORDER — HYDROMORPHONE HCL 1 MG/ML IJ SOLN: 1.0000 mg | Freq: Once | INTRAMUSCULAR | Status: DC | PRN

## 2017-12-31 MED ORDER — LIDOCAINE HCL (PF) 1 % IJ SOLN
INTRAMUSCULAR | Status: AC
  Filled 2017-12-31: qty 30

## 2017-12-31 MED ORDER — HEPARIN (PORCINE) IN NACL 1000-0.9 UT/500ML-% IV SOLN
INTRAVENOUS | Status: AC
  Filled 2017-12-31: qty 1000

## 2017-12-31 MED ORDER — SODIUM CHLORIDE 0.9% FLUSH: 3.0000 mL | INTRAVENOUS | Status: DC | PRN

## 2017-12-31 MED ORDER — HEPARIN SODIUM (PORCINE) 1000 UNIT/ML IJ SOLN
INTRAMUSCULAR | Status: AC
  Filled 2017-12-31: qty 1

## 2017-12-31 MED ORDER — MIDAZOLAM HCL 5 MG/5ML IJ SOLN
INTRAMUSCULAR | Status: AC
  Filled 2017-12-31: qty 5

## 2017-12-31 MED ORDER — METHYLPREDNISOLONE SODIUM SUCC 125 MG IJ SOLR: 125.0000 mg | INTRAMUSCULAR | Status: DC | PRN

## 2017-12-31 MED ORDER — SODIUM CHLORIDE 0.9 % IV SOLN
INTRAVENOUS | Status: DC
  Administered 2017-12-31: 08:00:00 via INTRAVENOUS

## 2017-12-31 MED ORDER — ACETAMINOPHEN 325 MG PO TABS: 650.0000 mg | ORAL_TABLET | ORAL | Status: DC | PRN

## 2017-12-31 MED ORDER — MIDAZOLAM HCL 2 MG/2ML IJ SOLN
INTRAMUSCULAR | Status: DC | PRN
  Administered 2017-12-31: 2 mg via INTRAVENOUS

## 2017-12-31 MED ORDER — CLOPIDOGREL BISULFATE 300 MG PO TABS
300.0000 mg | ORAL_TABLET | ORAL | Status: AC
  Administered 2017-12-31: 300 mg via ORAL

## 2017-12-31 MED ORDER — CLOPIDOGREL BISULFATE 75 MG PO TABS: 75.0000 mg | ORAL_TABLET | Freq: Every day | ORAL | 11 refills | Status: DC

## 2017-12-31 MED ORDER — SODIUM CHLORIDE 0.9% FLUSH: 3.0000 mL | Freq: Two times a day (BID) | INTRAVENOUS | Status: DC

## 2017-12-31 MED ORDER — FAMOTIDINE 20 MG PO TABS: 40.0000 mg | ORAL_TABLET | ORAL | Status: DC | PRN

## 2017-12-31 MED ORDER — OXYCODONE HCL 5 MG PO TABS: 5.0000 mg | ORAL_TABLET | ORAL | Status: DC | PRN

## 2017-12-31 MED ORDER — SODIUM CHLORIDE 0.9 % IV SOLN: INTRAVENOUS | Status: DC

## 2017-12-31 MED ORDER — SODIUM CHLORIDE 0.9 % IV SOLN
250.0000 mL | INTRAVENOUS | Status: DC | PRN
Start: 2017-12-31 — End: 2017-12-31

## 2017-12-31 MED ORDER — FENTANYL CITRATE (PF) 100 MCG/2ML IJ SOLN
INTRAMUSCULAR | Status: AC
  Filled 2017-12-31: qty 2

## 2017-12-31 MED ORDER — FENTANYL CITRATE (PF) 100 MCG/2ML IJ SOLN
INTRAMUSCULAR | Status: DC | PRN
  Administered 2017-12-31: 50 ug via INTRAVENOUS

## 2017-12-31 MED ORDER — SODIUM CHLORIDE FLUSH 0.9 % IV SOLN
INTRAVENOUS | Status: AC
  Filled 2017-12-31: qty 30

## 2017-12-31 MED ORDER — CEFAZOLIN SODIUM-DEXTROSE 2-4 GM/100ML-% IV SOLN
INTRAVENOUS | Status: AC
  Administered 2017-12-31: 2 g via INTRAVENOUS
  Filled 2017-12-31: qty 100

## 2017-12-31 MED ORDER — ONDANSETRON HCL 4 MG/2ML IJ SOLN: 4.0000 mg | Freq: Four times a day (QID) | INTRAMUSCULAR | Status: DC | PRN

## 2017-12-31 MED ORDER — LABETALOL HCL 5 MG/ML IV SOLN
INTRAVENOUS | Status: AC
  Filled 2017-12-31: qty 4

## 2017-12-31 SURGICAL SUPPLY — 33 items
BALLN LUTONIX 018 5X150X130 (BALLOONS) ×2
BALLN LUTONIX 018 5X40X130 (BALLOONS) ×2
BALLN LUTONIX 4X150X130 (BALLOONS) ×2
BALLN LUTONIX 5X150X130 (BALLOONS) ×2
BALLN LUTONIX DCB 4X100X130 (BALLOONS) ×2
BALLN LUTONIX DCB 5X60X130 (BALLOONS) ×2
BALLN ULTRVRSE 018 2.5X100X150 (BALLOONS) ×2
BALLOON LUTONIX 018 5X150X130 (BALLOONS) ×1 IMPLANT
BALLOON LUTONIX 018 5X40X130 (BALLOONS) ×1 IMPLANT
BALLOON LUTONIX 4X150X130 (BALLOONS) ×1 IMPLANT
BALLOON LUTONIX 5X150X130 (BALLOONS) ×1 IMPLANT
BALLOON LUTONIX DCB 4X100X130 (BALLOONS) ×1 IMPLANT
BALLOON LUTONIX DCB 5X60X130 (BALLOONS) ×1 IMPLANT
BALLOON ULTRVS 018 2.5X100X150 (BALLOONS) ×1 IMPLANT
CATH PIG 70CM (CATHETERS) ×2 IMPLANT
CATH VERT 5FR 125CM (CATHETERS) ×2 IMPLANT
DEVICE PRESTO INFLATION (MISCELLANEOUS) ×4 IMPLANT
DEVICE STARCLOSE SE CLOSURE (Vascular Products) ×4 IMPLANT
DEVICE TORQUE .025-.038 (MISCELLANEOUS) ×2 IMPLANT
NEEDLE ENTRY 21GA 7CM ECHOTIP (NEEDLE) ×2 IMPLANT
PACK ANGIOGRAPHY (CUSTOM PROCEDURE TRAY) ×2 IMPLANT
SET INTRO CAPELLA COAXIAL (SET/KITS/TRAYS/PACK) ×2 IMPLANT
SHEATH BRITE TIP 5FRX11 (SHEATH) ×2 IMPLANT
SHEATH BRITE TIP 7FRX11 (SHEATH) ×4 IMPLANT
SHEATH HIGHFLEX ANSEL 6FRX55 (SHEATH) ×2 IMPLANT
STENT LIFESTENT 6X170X130 (Permanent Stent) ×2 IMPLANT
STENT LIFESTREAM 7X58X80 (Permanent Stent) ×4 IMPLANT
TUBING CONTRAST HIGH PRESS 72 (TUBING) ×2 IMPLANT
WIRE AQUATRACK .035X260CM (WIRE) ×2 IMPLANT
WIRE G V18X300CM (WIRE) ×2 IMPLANT
WIRE J 3MM .035X145CM (WIRE) ×2 IMPLANT
WIRE MAGIC TOR.035 180C (WIRE) ×2 IMPLANT
WIRE MAGIC TORQUE 315CM (WIRE) ×2 IMPLANT

## 2017-12-31 NOTE — Op Note (Signed)
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 Introduction catheter into left lower extremity 3rd order catheter placement right femoral approach 2. Introduction catheter into aortaleft femoral approach              3. Percutaneous transluminal angioplasty and stent placement left superficial femoral and popliteal arteries to 5 mm 4. Percutaneous transluminal angioplasty left posterior tibial to 2.5 mm  5. Percutaneous transluminal angioplasty and stent placement right and left common iliac arteries using the kissing balloon technique             6.  Star close closure right and left common femoral arteriotomies  Anesthesia: Conscious sedation was administered under my direct supervision by the interventional radiology RN. IV Versed plus fentanyl were utilized. Continuous ECG, pulse oximetry and blood pressure was monitored throughout the entire procedure.  Conscious sedation was for a total of 1 hour 42 minutes.  Sheath: 6 [place] Ansell right common femoral for the left lower extremity intervention which was then upsized to a 7 French 11 cm Pinnacle sheath for the iliac intervention; 7 French 11 cm Pinnacle sheath left common femoral  Contrast: 120 cc  Fluoroscopy Time: 15.2 minutes  Indications: Crystal Miller presents with increasing pain of the left lower extremity.  The patient is having significant disability secondary to her claudication and feels that this is having a huge negative impact on her daily activities.  She was offered angiography with intervention earlier this year but elected conservative therapies.  She  return to the office saying that conservative therapies were not sufficient and that she wished to proceed with treatment.  The risks and benefits are reviewed all questions answered patient agrees to proceed.  Procedure:Crystal Miller is a 72 y.o. y.o. adult who was identified and appropriate procedural time out was performed. The patient was then placed supine on the table and prepped and draped in the usual sterile fashion.   Ultrasound was placed in the sterile sleeve and the right groin was evaluated the right common femoral artery was echolucent and pulsatile indicating patency. Image was recorded for the permanent record and under real-time visualization a microneedle was inserted into the common femoral artery followed by the microwire and then the micro-sheath. A J-wire was then advanced through the micro-sheath and a 5 [place] sheath was then inserted over a J-wire. J-wire was then advanced and a 5 French pigtail catheter was positioned at the level of T12.  AP projection of the aorta was then obtained. Pigtail catheter was repositioned to above the bifurcation and a RAO view of the pelvis was obtained. Subsequently a pigtail catheter with the stiff angle Glidewire was used to cross the aortic bifurcation the catheter wire were advanced down into the left distal external iliac artery. Oblique view of the femoral bifurcation was then obtained and subsequently the wire was reintroduced and the pigtail catheter negotiated into the SFA representing third order catheter placement. Distal runoff was then performed.  Imaging demonstrated diffuse disease within the aorta but no flow-limiting lesions.  There appears to be a moderate left renal artery stenosis.  The aortic bifurcation demonstrates increasing disease and there is a 50 to 60% stenosis of the proximal right common iliac artery and an 80% stenosis of the proximal left common iliac artery with significant poststenotic  dilatation in the  midportion of the left common iliac artery.  Bilateral external iliac arteries are widely patent.  The left common femoral and profunda femoris demonstrate atherosclerotic changes but no hemodynamically significant lesions.  In the proximal 6 cm of the left SFA there are tandem 80% stenoses.  The SFA is then free of hemodynamically significant lesions down to Hunter's canal where there is now diffuse disease with multiple greater than 80% stenoses noted.  This extends into the above-knee popliteal.  The below-knee popliteal demonstrates a string sign of greater than 90%.  Tibioperoneal trunk demonstrates diffuse disease with greater than 80 to 85% stenoses.  There are tandem lesions within the proximal one third of the anterior tibial H greater than 90%.  The peroneal appears to be small but patent throughout its entire length.  Anterior tibial is occluded throughout its entire length and the location of the anterior tibial origin is not identified.  5000 units of heparin was then given and allowed to circulate and a 6 [place] Ansell sheath was advanced up and over the bifurcation and positioned in the left common femoral artery  Straight catheter and stiff angle Glidewire were then negotiated down into the distal popliteal. Catheter was then advanced. Hand injection contrast demonstrated the tibial anatomy in detail which is described above.  I elected to treat the proximal SFA lesion first with a 6 mm x 60 mm Lutonix drug-eluting balloon which was used to angioplasty the SFA.  The inflation was for 2 minutes at 12 atm. Follow-up imaging demonstrated excellent patency with preservation of the distal runoff.  The detector was then repositioned to the distal SFA and popliteal which was reimaged.  As noted above multiple high-grade lesions are noted in this area and after appropriate measurements are made a 5 x 150 Lutonix balloon was used to angioplasty the superficial femoral and above-knee popliteal  arteries.  And then a 4 mm x 100 mm Lutonix drug-eluting balloon was used to treat the below-knee popliteal and tibioperoneal trunk.  The inflations were to 12 atmospheres for 2 minutes. Follow-up imaging demonstrated patency but greater than 60% residual stenosis throughout the area located within the Hunter's canal.  Therefore, a 6 mm x 170 mm life stent was deployed across this area and postdilated with a 5 mm Lutonix drug-eluting balloon inflated to 8 atm for 1 minute.  Follow-up imaging demonstrated wide patency with less than 5% residual stenosis throughout the SFA and above-knee popliteal.  Magnified imaging of the below-knee popliteal demonstrated greater than 40% residual stenosis and a second 4 mm x 100 mm Lutonix drug-eluting balloon was advanced across this lesion and this time inflated to 14 atm for 1 minute.  Follow-up imaging now demonstrated less than 15% residual stenosis throughout the distal popliteal and less than 5% residual stenosis within the tibioperoneal trunk.  The posterior tibial was then addressed.  Magnified imaging was performed and a 2.5 mm x 100 mm Ultraverse balloon was advanced into the posterior tibial 2 separate inflations were required both were to 10 atm for 1 minute.  Follow-up imaging demonstrated wide patency of the posterior tibial down to the foot filling the pedal arch.  There is preservation of the peroneal artery as well.  Attention was then turned to the iliac lesions.  The right Ansell sheath was pulled back into the right common iliac and the Magic torque wire advanced into the descending thoracic aorta.  Sheath was exchanged for an 11 cm 7 [place] Pinnacle sheath.  Ultrasound was brought back onto the field and the left groin was evaluated.  Left common femoral artery was identified with ultrasound was pulsatile and echolucent indicating patency.  Images recorded for the permanent record.  Micropuncture needle was then used to access the left common femoral after  1% lidocaine is been infiltrated.  Microwire followed micro-sheath J-wire followed by a 7 French 11 cm Pinnacle sheath.  Magic torque wire was then advanced under fluoroscopic guidance into the descending thoracic aorta.  Hand-injection contrast under magnified imaging was then used to opacify the distal aorta and iliac bifurcation.  With the Magic torque wire measuring tip the appropriate diameters and lengths were then determined and two 7 mm x 56 mm long Lifestream stents were prepped on the field and advanced up both wires.  They were positioned with the leading edge of the stent proximally 1 cm into the aorta.  Deployment was to 12 atm for 30 seconds simultaneously.  Follow-up imaging demonstrated wide patency with preservation of distal runoff.  There is less than 5% residual stenosis in the common iliacs with the stents well placed extending approximately 1 cm into the aorta.  After review of these images the sheaths are used to an oblique of the common femoral is obtained and a Star close device deployed first the left than the right. There no immediate Complications.  Findings:  Imaging demonstrated diffuse disease within the aorta but no flow-limiting lesions.  There appears to be a moderate left renal artery stenosis.  The aortic bifurcation demonstrates increasing disease and there is a 50 to 60% stenosis of the proximal right common iliac artery and an 80% stenosis of the proximal left common iliac artery with significant poststenotic dilatation in the midportion of the left common iliac artery.  Bilateral external iliac arteries are widely patent.  The left common femoral and profunda femoris demonstrate atherosclerotic changes but no hemodynamically significant lesions.  In the proximal 6 cm of the left SFA there are tandem 80% stenoses.  The SFA is then free of hemodynamically significant lesions down to Hunter's canal where there is now diffuse disease with multiple greater than 80% stenoses  noted.  This extends into the above-knee popliteal.  The below-knee popliteal demonstrates a string sign of greater than 90%.  Tibioperoneal trunk demonstrates diffuse disease with greater than 80 to 85% stenoses.  There are tandem lesions within the proximal one third of the anterior tibial H greater than 90%.  The peroneal appears to be small but patent throughout its entire length.  Anterior tibial is occluded throughout its entire length and the location of the anterior tibial origin is not identified.  Following angioplasty posterior tibial tibial now is in-line flow and looks quite nice with less than 5% residual stenosis. Angioplasty and stent placement of the SFA at Hunter's canal yields an excellent result with less than 5% residual stenosis.  Angioplasty and stent placement of the common iliac arteries bilaterally using the kissing balloons technique yields a less than 5% residual stenosis with well-positioned stents  Summary: Successful recanalization left lower extremity as well as successful treatment of a right common iliac artery lesion   Disposition: Patient was taken to the recovery room in stable condition having tolerated the procedure well.  Crystal Miller 12/31/2017,10:07 AM

## 2017-12-31 NOTE — H&P (Signed)
[facility] History & Physical Update  The patient was interviewed and re-examined.  The patient's previous History and Physical has been reviewed and is unchanged.  There is no change in the plan of care. We plan to proceed with the scheduled procedure.  Levora DredgeGregory Jadarian Mckay, MD  12/31/2017, 7:52 AM

## 2018-01-24 ENCOUNTER — Telehealth: Payer: Self-pay | Admitting: Family Medicine

## 2018-01-24 NOTE — Telephone Encounter (Signed)
Copied from CRM [id number]. Topic: Quick Communication - See Telephone Encounter >> Jan 24, 2018  3:13 PM Waymon Amato wrote: Pt is needing a refill on fosamax   [facility] [address]  [employer] number [phone]

## 2018-01-24 NOTE — Telephone Encounter (Signed)
Patient called, left VM to return call. Fosamax has refills. [facility] called and spoke to [place], Pensions consultant who says the patient has the Fosamax on hold, she will go ahead and refill the medication, the patient will be notified by text. The patient returned call and I asked the PEC Agent to relay the message, patient verbalized understanding.

## 2018-01-30 ENCOUNTER — Ambulatory Visit (INDEPENDENT_AMBULATORY_CARE_PROVIDER_SITE_OTHER): Payer: Medicare HMO | Admitting: Nurse Practitioner

## 2018-01-30 VITALS — BP 144/72 | HR 79 | Resp 16 | Ht 63.0 in | Wt 144.0 lb

## 2018-01-30 DIAGNOSIS — E118 Type 2 diabetes mellitus with unspecified complications: Secondary | ICD-10-CM | POA: Diagnosis not present

## 2018-01-30 DIAGNOSIS — I10 Essential (primary) hypertension: Secondary | ICD-10-CM

## 2018-01-30 DIAGNOSIS — E782 Mixed hyperlipidemia: Secondary | ICD-10-CM | POA: Diagnosis not present

## 2018-01-30 DIAGNOSIS — I739 Peripheral vascular disease, unspecified: Secondary | ICD-10-CM

## 2018-01-31 ENCOUNTER — Encounter (INDEPENDENT_AMBULATORY_CARE_PROVIDER_SITE_OTHER): Payer: Self-pay | Admitting: Nurse Practitioner

## 2018-01-31 NOTE — Progress Notes (Signed)
Subjective:    Patient ID: Crystal Miller, adult    DOB: 1946-02-03, 72 y.o.   MRN: [id number] Chief Complaint  Patient presents with  . Follow-up    left foot numbness    HPI  Crystal Miller is a 72 y.o. adult who presents with after having an evening where her leg swelled significantly, it was cool and pale.  The patient's daughter stated that during this time she was still able to palpate a pulse.  Patient stated that following ambulation for approximately an evening to have the swelling went down, the pain went away.  The patient only recently underwent a right lower extremity angiogram 12/31/2017.  Following the procedure there were no complications.  Significant disease was found and numerous areas were treated with stents and angioplasty.  The patient denies any fever, chills, nausea, vomiting, diarrhea.  Denies any chest pain or palpitations.  The patient denies any amaurosis fugax.  She denies any claudication-like pain or symptoms.  Constitutional: _0 Weight loss  _1 Fever  _2 Chills Cardiac: _3 Chest pain   _4 Chest pressure   _5 Palpitations   _6 Shortness of breath when laying flat   _7 Shortness of breath with exertion. Vascular:  _8 Pain in legs with walking   _9 Pain in legs with standing  _10 History of DVT   _11 Phlebitis   _12 Swelling in legs   _13 Varicose veins   _14 Non-healing ulcers Pulmonary:   _15 Uses home oxygen   _16 Productive cough   _17 Hemoptysis   _18 Wheeze  _19 COPD   _20 Asthma Neurologic:  _21 Dizziness   _22 Seizures   _23 History of stroke   _24 History of TIA  _25 Aphasia   _26 Vissual changes   _27 Weakness or numbness in arm   _28 Weakness or numbness in leg Musculoskeletal:   _29 Joint swelling   _30 Joint pain   _31 Low back pain Hematologic:  _32 Easy bruising  _33 Easy bleeding   _34 Hypercoagulable state   _35 Anemic Gastrointestinal:  _36 Diarrhea   _37 Vomiting  _38 Gastroesophageal reflux/heartburn   _39 Difficulty swallowing. Genitourinary:  _40 Chronic kidney disease   _41 Difficult urination  _42 Frequent  urination   _43 Blood in urine Skin:  _44 Rashes   _45 Ulcers  Psychological:  _46 History of anxiety   _47  History of major depression.     Objective:   Physical Exam  BP (!) 144/72 (BP Location: Right Arm)   Pulse 79   Resp 16   Ht _48  (1.6 m)   Wt 144 lb (65.3 kg)   BMI 25.51 kg/m   Past Medical History:  Diagnosis Date  . Depression    husband died in July 06, 2015  . DM (diabetes mellitus), type 2 with complications ([place]) 2/70/3500  . Essential hypertension 11/01/2016  . GERD (gastroesophageal reflux disease)   . Mixed hyperlipidemia 11/01/2016  . Peripheral vascular disease ([place])   . Stroke ([place]) 07/05/16   slight residual with speech. does not drive     Gen: WD/WN, NAD Head: [place]/AT, No temporalis wasting.  Ear/Nose/Throat: Hearing grossly intact, nares w/o erythema or drainage Eyes: PER, EOMI, sclera nonicteric.  Neck: Supple, no masses.  No JVD.  Pulmonary:  Good air movement, no use of accessory muscles.  Cardiac: RRR Vascular:  Vessel Right Left  Radial  palpable  palpable  PT   2+  DP   2+  Gastrointestinal: soft, non-distended. No guarding/no peritoneal signs.  Musculoskeletal: M/S 5/5 throughout.  No deformity or atrophy.  Neurologic: Pain and light touch intact in extremities.  Symmetrical.  Speech is fluent. Motor exam as listed above. Psychiatric: Judgment intact, Mood & affect appropriate for pt's clinical situation.  Dermatologic: No Venous rashes. No Ulcers Noted.  No changes consistent with cellulitis. Lymph : No Cervical lymphadenopathy, no lichenification or skin changes of chronic lymphedema.   Social History   Socioeconomic History  . Marital status: Widowed    Spouse name: Not on file  . Number of children: Not on file  . Years of education: Not on file  . Highest education level: Not on file  Occupational History  . 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 Financial resource strain: Not hard at all  . Food insecurity:     Worry: Never true    Inability: Never true  . Transportation needs:    Medical: No    Non-medical: No  Tobacco Use  . Smoking status: Former Smoker    Packs/day: 1.00    Years: 20.00    Pack years: 20.00    Types: Cigarettes    Last attempt to quit: 2012    Years since quitting: 7.7  . Smokeless tobacco: Never Used  Substance and Sexual Activity  . Alcohol use: No  . Drug use: No  . Sexual activity: Never  Lifestyle  . Physical activity:    Days per week: Not on file    Minutes per session: Not on file  . Stress: Not on file  Relationships  . Social connections:    Talks on phone: Not on file    Gets together: Not on file    Attends religious service: Not on file    Active member of club or organization: Not on file    Attends meetings of clubs or organizations: Not on file    Relationship status: Not on file  . Intimate partner violence:    Fear of current or ex partner: Not on file    Emotionally abused: Not on file    Physically abused: Not on file    Forced sexual activity: Not on file  Other Topics Concern  . Not on file  Social History Narrative  . Not on file    Past Surgical History:  Procedure Laterality Date  . CHOLECYSTECTOMY    . LOWER EXTREMITY ANGIOGRAPHY Left 12/31/2017   Procedure: LOWER EXTREMITY ANGIOGRAPHY;  Surgeon: Katha Cabal, MD;  Location: [facility];  Service: Cardiovascular;  Laterality: Left;    Family History  Problem Relation Age of Onset  . Alzheimer's disease Mother   . Arthritis Mother   . Hypertension Mother   . CVA Father   . CAD Father   . Heart disease Father   . Stroke Father   . Hypertension Father   . Stroke Brother   . Breast cancer Neg Hx     Allergies  Allergen Reactions  . Metformin And Related Diarrhea       Assessment & Plan:   1. PAD (peripheral artery disease) ([place]) I suspect that what the patient was describing was due to some reperfusion syndrome.  It is unlikely that this is  resulted from occlusion of any of her stents, because she currently had good, strong palpable pulses in office today.  The patient denies utilizing compression stockings following angiogram.  I have instructed the patient to wear graduated compression stockings 10 to 15 mmHg strength to control the mild edema.  I have spoken with her about elevating her lower extremities when not up and walking.  I also instructed the patient and her daughter that should this happen again, and she does not have  pulses they should contact our office.  She currently has a follow-up scheduled in our office for 1 month, with ABIs.  We will reassess her ABIs following intervention at this time.  Patient should undergo noninvasive studies as ordered. The patient will follow up with me after the studies.    2. Type 2 diabetes mellitus with complication, without long-term current use of insulin (HCC) Continue hypoglycemic medications as already ordered, these medications have been reviewed and there are no changes at this time.  Hgb A1C to be monitored as already arranged by primary service   3. Essential hypertension Continue antihypertensive medications as already ordered, these medications have been reviewed and there are no changes at this time.   4. Mixed hyperlipidemia Continue statin as ordered and reviewed, no changes at this time    Current Outpatient Medications on File Prior to Visit  Medication Sig Dispense Refill  . acetaminophen (TYLENOL) 500 MG tablet Take 1,000 mg by mouth every 6 (six) hours as needed (for pain.).    Marland Kitchen alendronate (FOSAMAX) 70 MG tablet Take 1 tablet (70 mg total) by mouth every 7 (seven) days. Take with a full glass of water on an empty stomach. (Patient taking differently: Take 70 mg by mouth every Sunday. Take with a full glass of water on an empty stomach.) 4 tablet 11  . amLODipine (NORVASC) 10 MG tablet Take 1 tablet (10 mg total) by mouth daily. (Patient taking differently:  Take 10 mg by mouth daily. In the morning) 90 tablet 3  . aspirin EC 81 MG EC tablet Take 1 tablet (81 mg total) by mouth daily. (Patient taking differently: Take 81 mg by mouth daily. In the morning) 30 tablet 2  . atorvastatin (LIPITOR) 40 MG tablet TAKE 1 TABLET BY MOUTH ONCE DAILY AT 6PM (Patient taking differently: Daily in the morning. (does not take at 6pm)) 90 tablet 1  . blood glucose meter kit and supplies KIT Dispense based on patient and insurance preference. Use up to four times daily as directed. (FOR ICD-9 250.00, 250.01). 1 each 0  . clopidogrel (PLAVIX) 75 MG tablet Take 1 tablet (75 mg total) by mouth daily. 30 tablet 11  . Multiple Vitamin (MULTIVITAMIN WITH MINERALS) TABS tablet Take 1 tablet by mouth daily. Centrum Silver . In the morning    . Polyethyl Glycol-Propyl Glycol (LUBRICANT EYE DROPS) 0.4-0.3 % SOLN Place 1 drop into both eyes 3 (three) times daily as needed (for dry eyes.).    Marland Kitchen sertraline (ZOLOFT) 25 MG tablet Take 1 tablet (25 mg total) by mouth daily. (Patient taking differently: Take 25 mg by mouth daily. Takes in the morning) 90 tablet 1  . vitamin B-12 (CYANOCOBALAMIN) 1000 MCG tablet Take 1,000 mcg by mouth daily. Takes in the morning     No current facility-administered medications on file prior to visit.     There are no Patient Instructions on file for this visit. No follow-ups on file.   Kris Hartmann, NP

## 2018-02-19 ENCOUNTER — Ambulatory Visit (INDEPENDENT_AMBULATORY_CARE_PROVIDER_SITE_OTHER): Payer: Medicare HMO | Admitting: Family Medicine

## 2018-02-19 ENCOUNTER — Encounter: Payer: Self-pay | Admitting: Family Medicine

## 2018-02-19 VITALS — BP 150/82 | HR 71 | Temp 98.4°F | Ht 63.0 in | Wt 145.2 lb

## 2018-02-19 DIAGNOSIS — M545 Low back pain, unspecified: Secondary | ICD-10-CM | POA: Insufficient documentation

## 2018-02-19 DIAGNOSIS — F39 Unspecified mood [affective] disorder: Secondary | ICD-10-CM | POA: Insufficient documentation

## 2018-02-19 DIAGNOSIS — R143 Flatulence: Secondary | ICD-10-CM | POA: Diagnosis not present

## 2018-02-19 DIAGNOSIS — I10 Essential (primary) hypertension: Secondary | ICD-10-CM | POA: Diagnosis not present

## 2018-02-19 DIAGNOSIS — E118 Type 2 diabetes mellitus with unspecified complications: Secondary | ICD-10-CM

## 2018-02-19 DIAGNOSIS — F325 Major depressive disorder, single episode, in full remission: Secondary | ICD-10-CM

## 2018-02-19 DIAGNOSIS — R69 Illness, unspecified: Secondary | ICD-10-CM | POA: Diagnosis not present

## 2018-02-19 DIAGNOSIS — Z23 Encounter for immunization: Secondary | ICD-10-CM

## 2018-02-19 DIAGNOSIS — E782 Mixed hyperlipidemia: Secondary | ICD-10-CM

## 2018-02-19 DIAGNOSIS — F329 Major depressive disorder, single episode, unspecified: Secondary | ICD-10-CM | POA: Insufficient documentation

## 2018-02-19 DIAGNOSIS — F32A Depression, unspecified: Secondary | ICD-10-CM | POA: Insufficient documentation

## 2018-02-19 LAB — LIPID PANEL
CHOLESTEROL: 106 mg/dL (ref 0–200)
HDL: 32.7 mg/dL — AB (ref >39.00)
NonHDL: 73.33
Total CHOL/HDL Ratio: 3
Triglycerides: 212 mg/dL — ABNORMAL HIGH (ref 0.0–149.0)
VLDL: 42.4 mg/dL — AB (ref 0.0–40.0)

## 2018-02-19 LAB — HEMOGLOBIN A1C: Hgb A1c MFr Bld: 7.6 % — ABNORMAL HIGH (ref 4.6–6.5)

## 2018-02-19 LAB — BASIC METABOLIC PANEL
BUN: 27 mg/dL — AB (ref 6–23)
CHLORIDE: 101 meq/L (ref 96–112)
CO2: 25 mEq/L (ref 19–32)
Calcium: 10.4 mg/dL (ref 8.4–10.5)
Creatinine, Ser: 1.29 mg/dL — ABNORMAL HIGH (ref 0.40–1.20)
GFR: 52.01 mL/min — AB (ref >60.00)
GLUCOSE: 162 mg/dL — AB (ref 70–99)
Potassium: 5.2 mEq/L — ABNORMAL HIGH (ref 3.5–5.1)
Sodium: 133 mEq/L — ABNORMAL LOW (ref 135–145)

## 2018-02-19 LAB — MICROALBUMIN / CREATININE URINE RATIO
Creatinine,U: 5.1 mg/dL
Microalb Creat Ratio: 41.9 mg/g — ABNORMAL HIGH (ref 0.0–30.0)
Microalb, Ur: 2.1 mg/dL — ABNORMAL HIGH (ref 0.0–1.9)

## 2018-02-19 LAB — LDL CHOLESTEROL, DIRECT: Direct LDL: 45 mg/dL

## 2018-02-19 NOTE — Assessment & Plan Note (Signed)
Above goal.  We will check renal function and potassium and determine the next medication to start her on.

## 2018-02-19 NOTE — Addendum Note (Signed)
Addended by: Gracelyn Nurse on: 02/19/2018 11:34 AM   Modules accepted: Orders

## 2018-02-19 NOTE — Assessment & Plan Note (Signed)
Check lipid panel.  Continue Lipitor. 

## 2018-02-19 NOTE — Assessment & Plan Note (Signed)
Suspect related to dietary intolerances particularly dairy products.  Discussed non-lactose containing dairy products.  She will monitor.

## 2018-02-19 NOTE — Patient Instructions (Signed)
Nice to see you. We will get lab work today and contact you with the results.  We will determine what blood pressure medication to start you on after the lab results return. We will have you do physical therapy for your back.  If your back does not improve with this please let us know. If you develop numbness, weakness, loss of bowel or bladder function, or worsening back pain please be evaluated immediately.

## 2018-02-19 NOTE — Assessment & Plan Note (Signed)
Check A1c. 

## 2018-02-19 NOTE — Assessment & Plan Note (Signed)
Benign exam.  No red flags.  Will refer for home health physical therapy.  If not improving she will let us know.  Given return precautions.

## 2018-02-19 NOTE — Assessment & Plan Note (Signed)
Asymptomatic currently.  She will continue Zoloft per her request.  She will monitor.

## 2018-02-19 NOTE — Progress Notes (Signed)
Marikay Alar, MD Phone: [phone]  Crystal Miller is a 72 y.o. adult who presents today for f/u.  CC: htn, hld, dm, anxiety/depression  HYPERTENSION Disease Monitoring: Blood pressure range-130-170/60-80  Chest pain- no      Dyspnea- no Medications: Compliance- taking amlodipine   Edema- had some following her PAD procedure, though she saw vascular and they advised compression stockings  DIABETES Disease Monitoring: Blood Sugar ranges-fasting well controlled Polyuria/phagia/dipsia- some polyuria  Optho- has an appointment in November Medications: Compliance- no meds  HYPERLIPIDEMIA Disease Monitoring: See symptoms for Hypertension Medications: Compliance- taking lipitor Right upper quadrant pain- no  Muscle aches- no  Excess gas: Patient notes she passes lots of gas.  Typically occurs when she eats certain things particularly dairy or beans.  No diarrhea.  No abdominal pain.  No constipation.  Depression: She has a history of this in the past.  She is asymptomatic currently.  No anxiety.  No SI.  She continues on Zoloft.  She would like to stay on this.  Right low back pain: Has been going on for about a month.  Typically bothers her mostly if she is standing.  No radiation.  No injury.  No numbness or weakness.  No incontinence.  Tries to stay active.   Social History   Tobacco Use  Smoking Status Former Smoker  . Packs/day: 1.00  . Years: 20.00  . Pack years: 20.00  . Types: Cigarettes  . Last attempt to quit: 2012  . Years since quitting: 7.7  Smokeless Tobacco Never Used     ROS see history of present illness  Objective  Physical Exam Vitals:   02/19/18 0946  BP: (!) 150/82  Pulse: 71  Temp: 98.4 F (36.9 C)  SpO2: 97%    BP Readings from Last 3 Encounters:  02/19/18 (!) 150/82  01/30/18 (!) 144/72  12/31/17 (!) 151/63   Wt Readings from Last 3 Encounters:  02/19/18 145 lb 3.2 oz (65.9 kg)  01/30/18 144 lb (65.3 kg)  12/30/17 146 lb 3 oz  (66.3 kg)    Physical Exam  Constitutional: No distress.  Cardiovascular: Normal rate, regular rhythm and normal heart sounds.  Pulmonary/Chest: Effort normal and breath sounds normal.  Abdominal: Soft. Bowel sounds are normal. She exhibits no distension. There is no tenderness. There is no rebound and no guarding.  Musculoskeletal: She exhibits no edema.  No midline spine tenderness, no midline spine step-off, no muscular back tenderness  Neurological: She is alert.  5/5 strength bilateral quads, hamstrings, plantar flexion, and dorsiflexion, sensation light touch intact bilateral lower extremities  Skin: Skin is warm and dry. She is not diaphoretic.     Assessment/Plan: Please see individual problem list.  Essential hypertension Above goal.  We will check renal function and potassium and determine the next medication to start her on.  DM (diabetes mellitus), type 2 with complications (HCC) Check A1c.  Mixed hyperlipidemia Check lipid panel.  Continue Lipitor.  Acute right-sided low back pain without sciatica Benign exam.  No red flags.  Will refer for home health physical therapy.  If not improving she will let us know.  Given return precautions.  Excessive gas Suspect related to dietary intolerances particularly dairy products.  Discussed non-lactose containing dairy products.  She will monitor.  Depression, major, single episode, complete remission (HCC) Asymptomatic currently.  She will continue Zoloft per her request.  She will monitor.   Orders Placed This Encounter  Procedures  . Basic Metabolic Panel (BMET)  . HgB  A1c  . Urine Microalbumin w/creat. ratio  . Lipid panel  . Ambulatory referral to Home Health    Referral Priority:   Routine    Referral Type:   Home Health Care    Referral Reason:   Specialty Services Required    Requested Specialty:   Home Health Services    Number of Visits Requested:   1    No orders of the defined types were placed in this  encounter.    Marikay Alar, MD [facility]

## 2018-02-20 ENCOUNTER — Other Ambulatory Visit: Payer: Self-pay | Admitting: Family Medicine

## 2018-02-20 ENCOUNTER — Other Ambulatory Visit: Payer: Self-pay

## 2018-02-20 DIAGNOSIS — E871 Hypo-osmolality and hyponatremia: Secondary | ICD-10-CM

## 2018-02-20 DIAGNOSIS — N183 Chronic kidney disease, stage 3 unspecified: Secondary | ICD-10-CM

## 2018-02-20 DIAGNOSIS — R809 Proteinuria, unspecified: Secondary | ICD-10-CM

## 2018-02-20 MED ORDER — EMPAGLIFLOZIN 10 MG PO TABS: 10.0000 mg | ORAL_TABLET | Freq: Every day | ORAL | 2 refills | Status: DC

## 2018-02-21 ENCOUNTER — Telehealth: Payer: Self-pay

## 2018-02-21 NOTE — Telephone Encounter (Signed)
Noted  

## 2018-02-21 NOTE — Telephone Encounter (Signed)
Copied from CRM [id number]. Topic: Referral - Question >> Feb 21, 2018  8:55 AM Harlan Stains wrote: Bjorn Loser from [facility] is calling in and stating referrall #  [id number] , thety state they will start physical therapy until Tuesday 02/25/2018 due to patient insurance purposes

## 2018-02-21 NOTE — Telephone Encounter (Signed)
Sent to PCP as an FYI  

## 2018-02-24 ENCOUNTER — Telehealth: Payer: Self-pay

## 2018-02-24 ENCOUNTER — Encounter

## 2018-02-24 ENCOUNTER — Ambulatory Visit (INDEPENDENT_AMBULATORY_CARE_PROVIDER_SITE_OTHER): Payer: Medicare HMO | Admitting: Vascular Surgery

## 2018-02-24 ENCOUNTER — Encounter (INDEPENDENT_AMBULATORY_CARE_PROVIDER_SITE_OTHER): Payer: Medicare HMO

## 2018-02-24 NOTE — Telephone Encounter (Signed)
Copied from CRM [id number]. Topic: General - Other >> Feb 24, 2018 11:26 AM Percival Spanish wrote:  Pt call to say she can not afford  empagliflozin (JARDIANCE) 10 MG TABS tablet    and is asking if there is something generic she can take    Pharmacy [facility] [address]

## 2018-02-24 NOTE — Telephone Encounter (Signed)
Copied from CRM [id number]. Topic: General - Other >> Feb 24, 2018 10:46 AM Marylen Ponto wrote: Reason for CRM: Erline Levine with [facility] states pt refuses PT due to insurance not paying. Cb#  [id number]

## 2018-02-24 NOTE — Telephone Encounter (Signed)
Sent to PCP for suggestions  

## 2018-02-24 NOTE — Telephone Encounter (Signed)
Sent to PCP as an FYI  

## 2018-02-25 ENCOUNTER — Other Ambulatory Visit (INDEPENDENT_AMBULATORY_CARE_PROVIDER_SITE_OTHER): Payer: Medicare HMO

## 2018-02-25 DIAGNOSIS — E871 Hypo-osmolality and hyponatremia: Secondary | ICD-10-CM

## 2018-02-25 LAB — BASIC METABOLIC PANEL
BUN: 33 mg/dL — ABNORMAL HIGH (ref 6–23)
CALCIUM: 10.4 mg/dL (ref 8.4–10.5)
CO2: 25 mEq/L (ref 19–32)
CREATININE: 1.36 mg/dL — AB (ref 0.40–1.20)
Chloride: 101 mEq/L (ref 96–112)
GFR: 48.93 mL/min — AB (ref >60.00)
Glucose, Bld: 161 mg/dL — ABNORMAL HIGH (ref 70–99)
Potassium: 5.4 mEq/L — ABNORMAL HIGH (ref 3.5–5.1)
SODIUM: 134 meq/L — AB (ref 135–145)

## 2018-02-25 NOTE — Telephone Encounter (Signed)
Noted  

## 2018-02-26 ENCOUNTER — Other Ambulatory Visit: Payer: Self-pay | Admitting: Family Medicine

## 2018-02-26 DIAGNOSIS — E875 Hyperkalemia: Secondary | ICD-10-CM

## 2018-02-26 NOTE — Telephone Encounter (Signed)
I am going to forward to [name] to see if she can look at the patients insurance to see what the cheapest options would be for diabetes medication. She has been intolerant of metformin.

## 2018-02-27 NOTE — Telephone Encounter (Signed)
Upon reviewing the [employer] formulary, [place] Pepper is preferred Tier 3, so the cheapest copay for a brand name medication.   It appears she was on immediate release metformin - many patients that do not tolerate IR metformin do tolerate low doses of the ER formulation. If you think she would be amenable to that, that would be the most preferable cost-effective option.   We could give her samples of Jardiance to see how she tolerates it while pursuing patient assistance. Jardiance's program through [employer] does not have an out of pocket spend requirement. It does take BI a few weeks to make a determination, though.    Glipizide would be the least preferable option due to risks of hypoglycemia and increased beta cell burnout, but would be a generic option if she absolutely cannot afford a Tier 3 copay for Jardiance.   [name] Feliz Beam, PharmD PGY2 Ambulatory Care Pharmacy Resident, [facility] Phone: [phone]

## 2018-02-27 NOTE — Telephone Encounter (Signed)
Sent to [employer]

## 2018-02-28 ENCOUNTER — Other Ambulatory Visit (INDEPENDENT_AMBULATORY_CARE_PROVIDER_SITE_OTHER): Payer: Medicare HMO

## 2018-02-28 DIAGNOSIS — E875 Hyperkalemia: Secondary | ICD-10-CM | POA: Diagnosis not present

## 2018-02-28 LAB — BASIC METABOLIC PANEL
BUN: 32 mg/dL — ABNORMAL HIGH (ref 6–23)
CALCIUM: 9.9 mg/dL (ref 8.4–10.5)
CO2: 27 mEq/L (ref 19–32)
Chloride: 101 mEq/L (ref 96–112)
Creatinine, Ser: 1.32 mg/dL — ABNORMAL HIGH (ref 0.40–1.20)
GFR: 50.65 mL/min — AB (ref >60.00)
GLUCOSE: 159 mg/dL — AB (ref 70–99)
POTASSIUM: 5.1 meq/L (ref 3.5–5.1)
SODIUM: 135 meq/L (ref 135–145)

## 2018-03-02 NOTE — Telephone Encounter (Signed)
Please let the patient know that we could try the extended release of metformin and see if she is able to tolerate that better.  If she would be willing to try this I can send it to her pharmacy.  Thanks.

## 2018-03-03 NOTE — Telephone Encounter (Signed)
Called and spoke with pt. Pt stated that she did end up getting the  empagliflozin (JARDIANCE) 10 MG TABS tablet  For a 90 day supply and seems to be doing well on the medication. Declined metformin since she did end up getting the  empagliflozin (JARDIANCE) 10 MG TABS tablet.   Sent to PCP as an [place]

## 2018-03-03 NOTE — Telephone Encounter (Signed)
Noted. Please make sure she has follow-up scheduled with me for 3-4 months for her diabetes.

## 2018-03-04 NOTE — Telephone Encounter (Signed)
Patient is scheduled to see Dr Birdie Sons 06/18/2018 due to her daughter's scheduled. Thanks

## 2018-03-04 NOTE — Telephone Encounter (Signed)
Called pt and left a VM to call back to get scheduled for an appointment with PCP to follow up on DM.

## 2018-03-05 ENCOUNTER — Ambulatory Visit (INDEPENDENT_AMBULATORY_CARE_PROVIDER_SITE_OTHER): Payer: Medicare HMO | Admitting: Vascular Surgery

## 2018-03-05 ENCOUNTER — Encounter (INDEPENDENT_AMBULATORY_CARE_PROVIDER_SITE_OTHER): Payer: Medicare HMO

## 2018-03-05 ENCOUNTER — Ambulatory Visit (INDEPENDENT_AMBULATORY_CARE_PROVIDER_SITE_OTHER): Payer: Medicare HMO | Admitting: *Deleted

## 2018-03-05 ENCOUNTER — Encounter: Payer: Self-pay | Admitting: *Deleted

## 2018-03-05 VITALS — BP 168/78 | HR 78

## 2018-03-05 DIAGNOSIS — I10 Essential (primary) hypertension: Secondary | ICD-10-CM

## 2018-03-05 NOTE — Progress Notes (Signed)
Patient here for nurse visit BP check per order from  02/19/18  Patient reports compliance with prescribed BP medications: yes  Last dose of BP medication: ( am per patient  BP Readings from Last 3 Encounters:  03/05/18 (!) 168/78  02/19/18 (!) 150/82  01/30/18 (!) 144/72   Pulse Readings from Last 3 Encounters:  03/05/18 78  02/19/18 71  01/30/18 79      Patient verbalized understanding of instructions.   Henrene Pastor, LPN

## 2018-03-06 ENCOUNTER — Ambulatory Visit (INDEPENDENT_AMBULATORY_CARE_PROVIDER_SITE_OTHER): Payer: Medicare HMO | Admitting: Nurse Practitioner

## 2018-03-06 ENCOUNTER — Encounter (INDEPENDENT_AMBULATORY_CARE_PROVIDER_SITE_OTHER): Payer: Self-pay | Admitting: Nurse Practitioner

## 2018-03-06 ENCOUNTER — Encounter (INDEPENDENT_AMBULATORY_CARE_PROVIDER_SITE_OTHER): Payer: Medicare HMO

## 2018-03-06 ENCOUNTER — Ambulatory Visit (INDEPENDENT_AMBULATORY_CARE_PROVIDER_SITE_OTHER): Payer: Medicare HMO

## 2018-03-06 VITALS — BP 157/70 | HR 77 | Resp 16 | Ht 63.0 in | Wt 143.6 lb

## 2018-03-06 DIAGNOSIS — I739 Peripheral vascular disease, unspecified: Secondary | ICD-10-CM

## 2018-03-06 DIAGNOSIS — E118 Type 2 diabetes mellitus with unspecified complications: Secondary | ICD-10-CM | POA: Diagnosis not present

## 2018-03-06 DIAGNOSIS — Z87891 Personal history of nicotine dependence: Secondary | ICD-10-CM

## 2018-03-06 DIAGNOSIS — I10 Essential (primary) hypertension: Secondary | ICD-10-CM

## 2018-03-06 NOTE — Progress Notes (Signed)
Blood pressure is uncontrolled.  I would recommend starting a low-dose of hydrochlorothiazide.  If she is willing to do this I can send it to her pharmacy.  She would need a blood pressure check and lab work in about a month.

## 2018-03-07 ENCOUNTER — Encounter (INDEPENDENT_AMBULATORY_CARE_PROVIDER_SITE_OTHER): Payer: Self-pay

## 2018-03-07 ENCOUNTER — Telehealth: Payer: Self-pay | Admitting: Radiology

## 2018-03-07 MED ORDER — HYDROCHLOROTHIAZIDE 12.5 MG PO TABS: 12.5000 mg | ORAL_TABLET | Freq: Every day | ORAL | 1 refills | Status: DC

## 2018-03-07 NOTE — Addendum Note (Signed)
Addended by: Glori Luis on: 03/07/2018 05:16 PM   Modules accepted: Orders

## 2018-03-07 NOTE — Addendum Note (Signed)
Addended by: Dennie Bible on: 03/07/2018 04:21 PM   Modules accepted: Orders

## 2018-03-07 NOTE — Telephone Encounter (Signed)
Pt coming in for labs Monday, please place future orders. Thank you 

## 2018-03-07 NOTE — Progress Notes (Signed)
Sent to pharmacy 

## 2018-03-07 NOTE — Progress Notes (Signed)
Lab ordered patient schedule lab and NV, please send HCTZ.

## 2018-03-08 NOTE — Telephone Encounter (Signed)
Order placed by [facility] yesterday.

## 2018-03-09 ENCOUNTER — Encounter (INDEPENDENT_AMBULATORY_CARE_PROVIDER_SITE_OTHER): Payer: Self-pay | Admitting: Nurse Practitioner

## 2018-03-09 NOTE — Progress Notes (Signed)
Subjective:    Patient ID: Crystal Miller, adult    DOB: April 12, 1946, 72 y.o.   MRN: [id number] Chief Complaint  Patient presents with  . Follow-up    [facility] 3week abi    HPI  Crystal Miller is a 72 y.o. female presents for follow-up of her lower extremity angiogram on 12/31/2017.  She is doing relatively well following her procedure, however she did have an episode of reperfusion syndrome with some excessive swelling, which is been resolved with elevation and utilization of a mild compression stocking.  The patient presents today with her daughter.  The patient notes that her claudication symptoms are greatly lessened on her left lower extremity, and somewhat on her right.  She still continues to have weakness with the need to rest for several periods while shopping.  She and her daughter are concerned because the patient lives alone, and there is concerned that her weakness could possibly to fall.  Today the patient underwent bilateral ABIs which revealed an ABI of 1.59 on the right, and 1.07 on the left.  Previous ABIs were 0.85 on the right, and 0.77 on the left.  The right TBI today is 0.45, with a previous TBI of 0.55.  There are monophasic waveforms in her anterior tibial artery of the right lower extremity.  Biphasic waveforms in the posterior tibial artery.  The left lower extremity has biphasic waveforms in the anterior tibial and posterior tibial arteries.  Past Medical History:  Diagnosis Date  . Depression    husband died in 08/05/15  . DM (diabetes mellitus), type 2 with complications ([place]) 9/74/1638  . Essential hypertension 11/01/2016  . GERD (gastroesophageal reflux disease)   . Mixed hyperlipidemia 11/01/2016  . Peripheral vascular disease ([place])   . Stroke ([place]) 08-04-16   slight residual with speech. does not drive    Social History   Socioeconomic History  . Marital status: Widowed    Spouse name: Not on file  . Number of children: Not on file  . Years of education: Not on  file  . Highest education level: Not on file  Occupational History  . 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 Financial resource strain: Not hard at all  . Food insecurity:    Worry: Never true    Inability: Never true  . Transportation needs:    Medical: No    Non-medical: No  Tobacco Use  . Smoking status: Former Smoker    Packs/day: 1.00    Years: 20.00    Pack years: 20.00    Types: Cigarettes    Last attempt to quit: 08-05-10    Years since quitting: 7.8  . Smokeless tobacco: Never Used  Substance and Sexual Activity  . Alcohol use: No  . Drug use: No  . Sexual activity: Never  Lifestyle  . Physical activity:    Days per week: Not on file    Minutes per session: Not on file  . Stress: Not on file  Relationships  . Social connections:    Talks on phone: Not on file    Gets together: Not on file    Attends religious service: Not on file    Active member of club or organization: Not on file    Attends meetings of clubs or organizations: Not on file    Relationship status: Not on file  . Intimate partner violence:    Fear of current or ex partner: Not on file  Emotionally abused: Not on file    Physically abused: Not on file    Forced sexual activity: Not on file  Other Topics Concern  . Not on file  Social History Narrative  . Not on file    Past Surgical History:  Procedure Laterality Date  . CHOLECYSTECTOMY    . LOWER EXTREMITY ANGIOGRAPHY Left 12/31/2017   Procedure: LOWER EXTREMITY ANGIOGRAPHY;  Surgeon: Katha Cabal, MD;  Location: [facility];  Service: Cardiovascular;  Laterality: Left;    Family History  Problem Relation Age of Onset  . Alzheimer's disease Mother   . Arthritis Mother   . Hypertension Mother   . CVA Father   . CAD Father   . Heart disease Father   . Stroke Father   . Hypertension Father   . Stroke Brother   . Breast cancer Neg Hx     Allergies  Allergen Reactions  . Metformin  And Related Diarrhea     Review of Systems   Review of Systems: Negative Unless Checked Constitutional: '[]' Weight loss  '[]' Fever  '[]' Chills Cardiac: '[]' Chest pain   '[]'  Atrial Fibrillation  '[]' Palpitations   '[]' Shortness of breath when laying flat   '[]' Shortness of breath with exertion. Vascular:  '[x]' Pain in legs with walking   '[]' Pain in legs with standing  '[]' History of DVT   '[]' Phlebitis   '[]' Swelling in legs   '[]' Varicose veins   '[]' Non-healing ulcers Pulmonary:   '[]' Uses home oxygen   '[]' Productive cough   '[]' Hemoptysis   '[]' Wheeze  '[]' COPD   '[]' Asthma Neurologic:  '[]' Dizziness   '[]' Seizures   '[x]' History of stroke   '[]' History of TIA  '[]' Aphasia   '[]' Vissual changes   '[]' Weakness or numbness in arm   '[]' Weakness or numbness in leg Musculoskeletal:   '[]' Joint swelling   '[]' Joint pain   '[]' Low back pain  '[]'  History of Knee Replacement Hematologic:  '[]' Easy bruising  '[]' Easy bleeding   '[]' Hypercoagulable state   '[]' Anemic Gastrointestinal:  '[]' Diarrhea   '[]' Vomiting  '[]' Gastroesophageal reflux/heartburn   '[]' Difficulty swallowing. Genitourinary:  '[]' Chronic kidney disease   '[]' Difficult urination  '[]' Anuric   '[]' Blood in urine Skin:  '[]' Rashes   '[]' Ulcers  Psychological:  '[]' History of anxiety   '[x]'  History of major depression  '[]'  Memory Difficulties     Objective:   Physical Exam  BP (!) 157/70 (BP Location: Right Arm)   Pulse 77   Resp 16   Ht '5\' 3"'  (1.6 m)   Wt 143 lb 9.6 oz (65.1 kg)   BMI 25.44 kg/m   Gen: WD/WN, NAD Head: [place]/AT, No temporalis wasting.  Ear/Nose/Throat: Hearing grossly intact, nares w/o erythema or drainage Eyes: PER, EOMI, sclera nonicteric.  Neck: Supple, no masses.  No JVD.  Pulmonary:  Good air movement, no use of accessory muscles.  Cardiac: RRR Vascular:  Vessel Right Left  Radial Palpable Palpable  Dorsalis Pedis Non-Palpable Palpable  Posterior Tibial Non-Palpable Palpable   Gastrointestinal: soft, non-distended. No guarding/no peritoneal signs.  Musculoskeletal: M/S 5/5 throughout.   No deformity or atrophy.  Neurologic: Pain and light touch intact in extremities.  Symmetrical.  Speech is fluent. Motor exam as listed above. Psychiatric: Judgment intact, Mood & affect appropriate for pt's clinical situation. Dermatologic: No Venous rashes. No Ulcers Noted.  No changes consistent with cellulitis. Lymph : No Cervical lymphadenopathy, no lichenification or skin changes of chronic lymphedema.      Assessment & Plan:   1. PAD (peripheral artery disease) ([place]) Today the patient underwent bilateral ABIs  which revealed an ABI of 1.59 on the right, and 1.07 on the left.  Previous ABIs were 0.85 on the right, and 0.77 on the left.  The right TBI today is 0.45, with a previous TBI of 0.55.  There are monophasic waveforms in her anterior tibial artery of the right lower extremity.  Biphasic waveforms in the posterior tibial artery.  The left lower extremity has biphasic waveforms in the anterior tibial and posterior tibial arteries.      Left lower extremity feels better following angiogram, patient and daughter concerned about right lower extremity.  Recommend:  The patient has experienced increased symptoms and is now describing lifestyle limiting claudication of her right lower extremity.  Given the severity of the patient's right lower extremity symptoms the patient should undergo angiography and intervention.  Risk and benefits were reviewed the patient.  Indications for the procedure were reviewed.  All questions were answered, the patient agrees to proceed.   The patient should continue walking and begin a more formal exercise program.  The patient should continue antiplatelet therapy and aggressive treatment of the lipid abnormalities.  The patient wishes to undergo procedure after Thanksgiving.   The patient will follow up with me after the angiogram.   2. DM (diabetes mellitus), type 2 with complications ([place]) Continue hypoglycemic medications as already ordered,  these medications have been reviewed and there are no changes at this time.  Hgb A1C to be monitored as already arranged by primary service   3. Essential hypertension Continue antihypertensive medications as already ordered, these medications have been reviewed and there are no changes at this time.    Current Outpatient Medications on File Prior to Visit  Medication Sig Dispense Refill  . acetaminophen (TYLENOL) 500 MG tablet Take 1,000 mg by mouth every 6 (six) hours as needed (for pain.).    Marland Kitchen alendronate (FOSAMAX) 70 MG tablet Take 1 tablet (70 mg total) by mouth every 7 (seven) days. Take with a full glass of water on an empty stomach. (Patient taking differently: Take 70 mg by mouth every Sunday. Take with a full glass of water on an empty stomach.) 4 tablet 11  . amLODipine (NORVASC) 10 MG tablet Take 1 tablet (10 mg total) by mouth daily. (Patient taking differently: Take 10 mg by mouth daily. In the morning) 90 tablet 3  . aspirin EC 81 MG EC tablet Take 1 tablet (81 mg total) by mouth daily. (Patient taking differently: Take 81 mg by mouth daily. In the morning) 30 tablet 2  . atorvastatin (LIPITOR) 40 MG tablet TAKE 1 TABLET BY MOUTH ONCE DAILY AT 6PM (Patient taking differently: Daily in the morning. (does not take at 6pm)) 90 tablet 1  . blood glucose meter kit and supplies KIT Dispense based on patient and insurance preference. Use up to four times daily as directed. (FOR ICD-9 250.00, 250.01). 1 each 0  . clopidogrel (PLAVIX) 75 MG tablet Take 1 tablet (75 mg total) by mouth daily. 30 tablet 11  . empagliflozin (JARDIANCE) 10 MG TABS tablet Take 10 mg by mouth daily. 30 tablet 2  . Multiple Vitamin (MULTIVITAMIN WITH MINERALS) TABS tablet Take 1 tablet by mouth daily. Centrum Silver . In the morning    . Polyethyl Glycol-Propyl Glycol (LUBRICANT EYE DROPS) 0.4-0.3 % SOLN Place 1 drop into both eyes 3 (three) times daily as needed (for dry eyes.).    Marland Kitchen sertraline (ZOLOFT) 25 MG  tablet Take 1 tablet (25 mg total) by mouth daily. (  Patient taking differently: Take 25 mg by mouth daily. Takes in the morning) 90 tablet 1  . vitamin B-12 (CYANOCOBALAMIN) 1000 MCG tablet Take 1,000 mcg by mouth daily. Takes in the morning    . hydrochlorothiazide (HYDRODIURIL) 12.5 MG tablet Take 1 tablet (12.5 mg total) by mouth daily. 90 tablet 1   No current facility-administered medications on file prior to visit.     There are no Patient Instructions on file for this visit. No follow-ups on file.   Kris Hartmann, NP

## 2018-03-10 ENCOUNTER — Other Ambulatory Visit (INDEPENDENT_AMBULATORY_CARE_PROVIDER_SITE_OTHER): Payer: Medicare HMO

## 2018-03-10 DIAGNOSIS — I10 Essential (primary) hypertension: Secondary | ICD-10-CM | POA: Diagnosis not present

## 2018-03-10 LAB — COMPREHENSIVE METABOLIC PANEL
ALBUMIN: 4.2 g/dL (ref 3.5–5.2)
ALT: 23 U/L (ref 0–35)
AST: 20 U/L (ref 0–37)
Alkaline Phosphatase: 60 U/L (ref 39–117)
BUN: 34 mg/dL — ABNORMAL HIGH (ref 6–23)
CHLORIDE: 102 meq/L (ref 96–112)
CO2: 26 mEq/L (ref 19–32)
Calcium: 10 mg/dL (ref 8.4–10.5)
Creatinine, Ser: 1.44 mg/dL — ABNORMAL HIGH (ref 0.40–1.20)
GFR: 45.81 mL/min — ABNORMAL LOW (ref >60.00)
GLUCOSE: 232 mg/dL — AB (ref 70–99)
POTASSIUM: 5.4 meq/L — AB (ref 3.5–5.1)
Sodium: 136 mEq/L (ref 135–145)
Total Bilirubin: 0.4 mg/dL (ref 0.2–1.2)
Total Protein: 8.1 g/dL (ref 6.0–8.3)

## 2018-03-11 ENCOUNTER — Other Ambulatory Visit: Payer: Self-pay | Admitting: Family Medicine

## 2018-03-11 DIAGNOSIS — N179 Acute kidney failure, unspecified: Secondary | ICD-10-CM

## 2018-03-13 NOTE — Progress Notes (Signed)
See phone note

## 2018-03-14 ENCOUNTER — Ambulatory Visit
Admission: RE | Admit: 2018-03-14 | Discharge: 2018-03-14 | Disposition: A | Discharge status: Home / Self Care | Payer: Medicare HMO | Source: Ambulatory Visit | Attending: Family Medicine | Admitting: Family Medicine

## 2018-03-14 ENCOUNTER — Other Ambulatory Visit (INDEPENDENT_AMBULATORY_CARE_PROVIDER_SITE_OTHER): Payer: Medicare HMO

## 2018-03-14 DIAGNOSIS — N179 Acute kidney failure, unspecified: Secondary | ICD-10-CM

## 2018-03-14 DIAGNOSIS — N281 Cyst of kidney, acquired: Secondary | ICD-10-CM | POA: Insufficient documentation

## 2018-03-14 LAB — BASIC METABOLIC PANEL
BUN: 38 mg/dL — ABNORMAL HIGH (ref 6–23)
CALCIUM: 9.9 mg/dL (ref 8.4–10.5)
CHLORIDE: 99 meq/L (ref 96–112)
CO2: 24 mEq/L (ref 19–32)
Creatinine, Ser: 1.35 mg/dL — ABNORMAL HIGH (ref 0.40–1.20)
GFR: 49.35 mL/min — AB (ref >60.00)
Glucose, Bld: 237 mg/dL — ABNORMAL HIGH (ref 70–99)
Potassium: 4.9 mEq/L (ref 3.5–5.1)
Sodium: 132 mEq/L — ABNORMAL LOW (ref 135–145)

## 2018-03-17 ENCOUNTER — Telehealth: Payer: Self-pay | Admitting: Family Medicine

## 2018-03-17 NOTE — Telephone Encounter (Signed)
Copied from CRM [id number]. Topic: General - Other >> Mar 17, 2018  5:00 PM Mcneil, Ja-Kwan wrote: Reason for CRM: Pt daughter Chrischelle states she missed a call from the office. She states she was suppose to receive a call to discuss ultrasound results. Chrischelle requests a call back. Cb# [phone]

## 2018-03-18 NOTE — Telephone Encounter (Signed)
Called and spoke with patient mother yesterday to go over lab results and pt was advised and voiced understanding.   Pt's daughter stated that she would like it documented in her mother's chart to not call her mother in regards to lab results until later in the day due to her mother getting really upset when getting her lab results. Pt's daughter stated that he mom just doesn't take information in regards to her health well it makes her really depressed and sad. Pt's daughter stated that her mom cry all day yesterday after receiving her lab results yesterday. Daughter is just really worried due to her mother having a  Stroke and not being stable and with her feeling emotional and cry it just makes everything worse.   Plus she is having issue with getting into her mother Mychart she stated that it keep stating that her mother's account is deactivated.   Please set up a note in patient's chart to only call patient after 4 PM and call pt's daughter to assist with Mychart issue.   Thanks

## 2018-03-19 NOTE — Telephone Encounter (Signed)
I called pt daughter to help her with the Mychart. Thank you!

## 2018-03-24 ENCOUNTER — Telehealth: Payer: Self-pay | Admitting: Radiology

## 2018-03-24 DIAGNOSIS — E871 Hypo-osmolality and hyponatremia: Secondary | ICD-10-CM

## 2018-03-24 NOTE — Telephone Encounter (Signed)
Pt coming in for labs Wednesday, please place future orders. Thank you 

## 2018-03-25 NOTE — Addendum Note (Signed)
Addended by: Birdie Sons, Arbie Blankley G on: 03/25/2018 11:07 AM   Modules accepted: Orders

## 2018-03-26 ENCOUNTER — Other Ambulatory Visit (INDEPENDENT_AMBULATORY_CARE_PROVIDER_SITE_OTHER): Payer: Medicare HMO

## 2018-03-26 ENCOUNTER — Ambulatory Visit: Payer: Medicare HMO | Admitting: Lab

## 2018-03-26 DIAGNOSIS — E871 Hypo-osmolality and hyponatremia: Secondary | ICD-10-CM

## 2018-03-26 DIAGNOSIS — I10 Essential (primary) hypertension: Secondary | ICD-10-CM

## 2018-03-26 LAB — BASIC METABOLIC PANEL
BUN: 28 mg/dL — AB (ref 6–23)
CHLORIDE: 102 meq/L (ref 96–112)
CO2: 25 mEq/L (ref 19–32)
Calcium: 9.7 mg/dL (ref 8.4–10.5)
Creatinine, Ser: 1.4 mg/dL — ABNORMAL HIGH (ref 0.40–1.20)
GFR: 47.31 mL/min — AB (ref >60.00)
Glucose, Bld: 165 mg/dL — ABNORMAL HIGH (ref 70–99)
POTASSIUM: 5.3 meq/L — AB (ref 3.5–5.1)
Sodium: 134 mEq/L — ABNORMAL LOW (ref 135–145)

## 2018-03-26 NOTE — Progress Notes (Signed)
Pt here for Bp check 158/78.

## 2018-03-28 ENCOUNTER — Telehealth: Payer: Self-pay | Admitting: Family Medicine

## 2018-03-28 DIAGNOSIS — I10 Essential (primary) hypertension: Secondary | ICD-10-CM

## 2018-03-28 NOTE — Progress Notes (Signed)
Called Pt and spoke to daughter, whom stated that the Pt has not been taking the HCTZ. Pt was told to stop taking it after last labs and was told she had a cyst on her kidney by the nurse that called her. Pt is still taking the amlodipine.

## 2018-03-28 NOTE — Telephone Encounter (Signed)
Daughter wants to clarify if pt. Was supposed to stop her HCTZ. Pt. Reports "someone called and told her to stop it." Medication is still on her medication list. Please advise.

## 2018-03-28 NOTE — Telephone Encounter (Signed)
I don't see note in chart stating patient should stop taking medication. Please advise?

## 2018-03-28 NOTE — Telephone Encounter (Signed)
Copied from CRM [id number]. Topic: General - Other >> Mar 28, 2018  2:11 PM Gerrianne Scale wrote: Reason for CRM: pt calling back about lab results  Noted in labs that [facility] can give out results

## 2018-03-28 NOTE — Progress Notes (Signed)
BP remains elevated. Please confirm that she is taking her HCTZ and amlodipine. If she is we will need to consider adding an additional medication.

## 2018-03-31 NOTE — Telephone Encounter (Signed)
Per lab notes on 03/14/2018 Dr. Birdie Sons wanted her to stop the HCTZ and come back in for a BP check and recheck lab work. Did you want the patient to go back on the HCTZ or follow up with the urolgist on if she should continue the HCTZ or not pt does not have an appt with urologist untl 04/11/2018.

## 2018-04-01 NOTE — Telephone Encounter (Signed)
The patient should no longer be taking HCTZ.  I removed this from her medication list.  She will need to see the nephrologist.  Her blood pressure appears to be uncontrolled still.  I would like to add a medication called hydralazine if she is willing.  Please see if she would be willing to do this and I can send it to her pharmacy.

## 2018-04-02 ENCOUNTER — Telehealth: Payer: Self-pay

## 2018-04-02 NOTE — Telephone Encounter (Signed)
I spoke with Neysa BonitoChristy patient's daughter. I reviewed Dr. Purvis SheffieldSonnenberg's message with her in regards to stopping the HCTZ & adding hydralazine instead. Her daughter agreed to this. Please send rx to [facility], [address] [place].

## 2018-04-02 NOTE — Telephone Encounter (Signed)
Called and left a VM to call back. CRM created and sent to [facility] pool. 

## 2018-04-02 NOTE — Telephone Encounter (Signed)
I spoke with Crystal Miller patient's daughter. I reviewed Dr. Purvis SheffieldSonnenberg's message with her in regards to stopping the HCTZ & adding hydralazine instead. Her daughter agreed to this. Please send rx to [facility], [address] [place].   Sent to PCP

## 2018-04-03 NOTE — Progress Notes (Signed)
Noted. Medication changes communicated via phone note.

## 2018-04-04 NOTE — Telephone Encounter (Signed)
I am going to forward this to [name] to determine if there are any additional medication options.  It appears there may be a warning regarding use of hydralazine in patients with prior stroke given the potential for coronary artery disease.

## 2018-04-07 NOTE — Telephone Encounter (Signed)
Hydralazine does have a warning in patients with CAD; as it is only an arterial vasodilator, reflex sympathetic stimulation may occur.  For this patient, we could consider addition of carvedilol instead, as her HR has room for decrease.

## 2018-04-07 NOTE — Telephone Encounter (Signed)
Pt coming in for labs tomorrow, please place future orders. Thank you.  

## 2018-04-08 ENCOUNTER — Other Ambulatory Visit: Payer: Medicare HMO

## 2018-04-08 ENCOUNTER — Ambulatory Visit: Payer: Medicare HMO

## 2018-04-08 ENCOUNTER — Encounter (INDEPENDENT_AMBULATORY_CARE_PROVIDER_SITE_OTHER): Payer: Self-pay

## 2018-04-08 MED ORDER — CARVEDILOL 3.125 MG PO TABS: 3.1250 mg | ORAL_TABLET | Freq: Two times a day (BID) | ORAL | 3 refills | Status: DC

## 2018-04-08 NOTE — Telephone Encounter (Signed)
Called and spoke with patient's daughter. Daughter advised and voiced understanding. Pt has been rescheduled for her BP check so we can see how she does on the carvedilol medication. Pt has been scheduled for Bp check on 04/23/2018

## 2018-04-08 NOTE — Telephone Encounter (Signed)
Called patient and left a VM to call back. CRM created and sent to [facility] pool.  

## 2018-04-08 NOTE — Telephone Encounter (Signed)
Please let the patient know that I sent in carvedilol for her to take for her BP. She will need a BP check 7-10 days after starting this medication.

## 2018-04-09 DIAGNOSIS — E1122 Type 2 diabetes mellitus with diabetic chronic kidney disease: Secondary | ICD-10-CM | POA: Diagnosis not present

## 2018-04-09 DIAGNOSIS — N183 Chronic kidney disease, stage 3 (moderate): Secondary | ICD-10-CM | POA: Diagnosis not present

## 2018-04-09 DIAGNOSIS — I129 Hypertensive chronic kidney disease with stage 1 through stage 4 chronic kidney disease, or unspecified chronic kidney disease: Secondary | ICD-10-CM | POA: Diagnosis not present

## 2018-04-10 ENCOUNTER — Other Ambulatory Visit (INDEPENDENT_AMBULATORY_CARE_PROVIDER_SITE_OTHER): Payer: Medicare HMO

## 2018-04-10 ENCOUNTER — Ambulatory Visit: Payer: Medicare HMO

## 2018-04-10 DIAGNOSIS — I10 Essential (primary) hypertension: Secondary | ICD-10-CM | POA: Diagnosis not present

## 2018-04-10 LAB — COMPREHENSIVE METABOLIC PANEL
ALK PHOS: 60 U/L (ref 39–117)
ALT: 23 U/L (ref 0–35)
AST: 20 U/L (ref 0–37)
Albumin: 4.2 g/dL (ref 3.5–5.2)
BILIRUBIN TOTAL: 0.3 mg/dL (ref 0.2–1.2)
BUN: 41 mg/dL — AB (ref 6–23)
CO2: 24 mEq/L (ref 19–32)
CREATININE: 1.35 mg/dL — AB (ref 0.40–1.20)
Calcium: 9.6 mg/dL (ref 8.4–10.5)
Chloride: 104 mEq/L (ref 96–112)
GFR: 49.34 mL/min — ABNORMAL LOW (ref >60.00)
Glucose, Bld: 127 mg/dL — ABNORMAL HIGH (ref 70–99)
Potassium: 4.9 mEq/L (ref 3.5–5.1)
SODIUM: 136 meq/L (ref 135–145)
TOTAL PROTEIN: 7.8 g/dL (ref 6.0–8.3)

## 2018-04-11 ENCOUNTER — Telehealth: Payer: Self-pay | Admitting: Family Medicine

## 2018-04-11 NOTE — Telephone Encounter (Signed)
See result note.  Information provided.

## 2018-04-11 NOTE — Telephone Encounter (Signed)
Copied from CRM [id number]. Topic: Quick Communication - Lab Results (Clinic Use ONLY) >> Apr 11, 2018 11:33 AM Gracelyn NurseBlackwell, Shelby P, [place] wrote: Called patient to inform them of  lab results. When patient returns call, triage nurse may disclose results.  Please call Daughter, Chrischelle [phone]

## 2018-04-14 ENCOUNTER — Encounter (INDEPENDENT_AMBULATORY_CARE_PROVIDER_SITE_OTHER): Payer: Medicare HMO

## 2018-04-14 ENCOUNTER — Ambulatory Visit (INDEPENDENT_AMBULATORY_CARE_PROVIDER_SITE_OTHER): Payer: Medicare HMO | Admitting: Vascular Surgery

## 2018-04-16 ENCOUNTER — Other Ambulatory Visit (INDEPENDENT_AMBULATORY_CARE_PROVIDER_SITE_OTHER): Payer: Self-pay | Admitting: Nurse Practitioner

## 2018-04-21 ENCOUNTER — Encounter
Admission: RE | Admit: 2018-04-21 | Discharge: 2018-04-21 | Disposition: A | Discharge status: Home / Self Care | Payer: Medicare HMO | Source: Ambulatory Visit | Attending: Vascular Surgery | Admitting: Vascular Surgery

## 2018-04-21 DIAGNOSIS — F329 Major depressive disorder, single episode, unspecified: Secondary | ICD-10-CM | POA: Diagnosis not present

## 2018-04-21 DIAGNOSIS — Z7984 Long term (current) use of oral hypoglycemic drugs: Secondary | ICD-10-CM | POA: Diagnosis not present

## 2018-04-21 DIAGNOSIS — Z8673 Personal history of transient ischemic attack (TIA), and cerebral infarction without residual deficits: Secondary | ICD-10-CM | POA: Diagnosis not present

## 2018-04-21 DIAGNOSIS — K219 Gastro-esophageal reflux disease without esophagitis: Secondary | ICD-10-CM | POA: Diagnosis not present

## 2018-04-21 DIAGNOSIS — E1151 Type 2 diabetes mellitus with diabetic peripheral angiopathy without gangrene: Secondary | ICD-10-CM | POA: Diagnosis not present

## 2018-04-21 DIAGNOSIS — Z87891 Personal history of nicotine dependence: Secondary | ICD-10-CM | POA: Diagnosis not present

## 2018-04-21 DIAGNOSIS — E782 Mixed hyperlipidemia: Secondary | ICD-10-CM | POA: Diagnosis not present

## 2018-04-21 DIAGNOSIS — I70213 Atherosclerosis of native arteries of extremities with intermittent claudication, bilateral legs: Secondary | ICD-10-CM | POA: Diagnosis present

## 2018-04-21 DIAGNOSIS — Z79899 Other long term (current) drug therapy: Secondary | ICD-10-CM | POA: Diagnosis not present

## 2018-04-21 DIAGNOSIS — Z7982 Long term (current) use of aspirin: Secondary | ICD-10-CM | POA: Diagnosis not present

## 2018-04-21 DIAGNOSIS — Z01812 Encounter for preprocedural laboratory examination: Secondary | ICD-10-CM | POA: Insufficient documentation

## 2018-04-21 DIAGNOSIS — R69 Illness, unspecified: Secondary | ICD-10-CM | POA: Diagnosis not present

## 2018-04-21 DIAGNOSIS — Z8249 Family history of ischemic heart disease and other diseases of the circulatory system: Secondary | ICD-10-CM | POA: Diagnosis not present

## 2018-04-21 DIAGNOSIS — I10 Essential (primary) hypertension: Secondary | ICD-10-CM | POA: Diagnosis not present

## 2018-04-21 HISTORY — DX: Disorder of kidney and ureter, unspecified: N28.9

## 2018-04-21 LAB — CREATININE, SERUM
Creatinine, Ser: 1.21 mg/dL — ABNORMAL HIGH (ref 0.44–1.00)
GFR, EST AFRICAN AMERICAN: 51 mL/min — AB (ref >60)
GFR, EST NON AFRICAN AMERICAN: 44 mL/min — AB (ref >60)

## 2018-04-21 LAB — BUN: BUN: 31 mg/dL — ABNORMAL HIGH (ref 8–23)

## 2018-04-21 MED ORDER — DEXTROSE 5 % IV SOLN
2.0000 g | Freq: Once | INTRAVENOUS | Status: DC
  Filled 2018-04-21: qty 20

## 2018-04-22 ENCOUNTER — Other Ambulatory Visit: Payer: Self-pay

## 2018-04-22 ENCOUNTER — Ambulatory Visit
Admission: RE | Admit: 2018-04-22 | Discharge: 2018-04-22 | Disposition: A | Discharge status: Home / Self Care | Payer: Medicare HMO | Source: Ambulatory Visit | Attending: Vascular Surgery | Admitting: Vascular Surgery

## 2018-04-22 ENCOUNTER — Encounter
Admission: RE | Disposition: A | Discharge status: Home / Self Care | Payer: Self-pay | Source: Ambulatory Visit | Attending: Vascular Surgery

## 2018-04-22 DIAGNOSIS — I70211 Atherosclerosis of native arteries of extremities with intermittent claudication, right leg: Secondary | ICD-10-CM | POA: Diagnosis not present

## 2018-04-22 DIAGNOSIS — Z87891 Personal history of nicotine dependence: Secondary | ICD-10-CM | POA: Insufficient documentation

## 2018-04-22 DIAGNOSIS — I70213 Atherosclerosis of native arteries of extremities with intermittent claudication, bilateral legs: Secondary | ICD-10-CM | POA: Insufficient documentation

## 2018-04-22 DIAGNOSIS — Z8673 Personal history of transient ischemic attack (TIA), and cerebral infarction without residual deficits: Secondary | ICD-10-CM | POA: Diagnosis not present

## 2018-04-22 DIAGNOSIS — E782 Mixed hyperlipidemia: Secondary | ICD-10-CM | POA: Diagnosis not present

## 2018-04-22 DIAGNOSIS — I10 Essential (primary) hypertension: Secondary | ICD-10-CM | POA: Insufficient documentation

## 2018-04-22 DIAGNOSIS — I739 Peripheral vascular disease, unspecified: Secondary | ICD-10-CM

## 2018-04-22 DIAGNOSIS — K219 Gastro-esophageal reflux disease without esophagitis: Secondary | ICD-10-CM | POA: Insufficient documentation

## 2018-04-22 DIAGNOSIS — Z79899 Other long term (current) drug therapy: Secondary | ICD-10-CM | POA: Diagnosis not present

## 2018-04-22 DIAGNOSIS — F329 Major depressive disorder, single episode, unspecified: Secondary | ICD-10-CM | POA: Insufficient documentation

## 2018-04-22 DIAGNOSIS — E1151 Type 2 diabetes mellitus with diabetic peripheral angiopathy without gangrene: Secondary | ICD-10-CM | POA: Insufficient documentation

## 2018-04-22 DIAGNOSIS — R69 Illness, unspecified: Secondary | ICD-10-CM | POA: Diagnosis not present

## 2018-04-22 DIAGNOSIS — Z8249 Family history of ischemic heart disease and other diseases of the circulatory system: Secondary | ICD-10-CM | POA: Insufficient documentation

## 2018-04-22 DIAGNOSIS — Z7982 Long term (current) use of aspirin: Secondary | ICD-10-CM | POA: Diagnosis not present

## 2018-04-22 DIAGNOSIS — Z7984 Long term (current) use of oral hypoglycemic drugs: Secondary | ICD-10-CM | POA: Insufficient documentation

## 2018-04-22 HISTORY — PX: LOWER EXTREMITY ANGIOGRAPHY: CATH118251

## 2018-04-22 LAB — GLUCOSE, CAPILLARY: Glucose-Capillary: 162 mg/dL — ABNORMAL HIGH (ref 70–99)

## 2018-04-22 SURGERY — LOWER EXTREMITY ANGIOGRAPHY
Anesthesia: Moderate Sedation | Site: Leg Lower | Laterality: Right

## 2018-04-22 MED ORDER — HEPARIN (PORCINE) IN NACL 1000-0.9 UT/500ML-% IV SOLN
INTRAVENOUS | Status: AC
  Filled 2018-04-22: qty 1000

## 2018-04-22 MED ORDER — SODIUM CHLORIDE 0.9 % IV BOLUS
250.0000 mL | Freq: Once | INTRAVENOUS | Status: AC
  Administered 2018-04-22: 1000 mL via INTRAVENOUS

## 2018-04-22 MED ORDER — HYDROMORPHONE HCL 1 MG/ML IJ SOLN: 1.0000 mg | Freq: Once | INTRAMUSCULAR | Status: DC | PRN

## 2018-04-22 MED ORDER — MORPHINE SULFATE (PF) 4 MG/ML IV SOLN: 2.0000 mg | INTRAVENOUS | Status: DC | PRN

## 2018-04-22 MED ORDER — ONDANSETRON HCL 4 MG/2ML IJ SOLN: 4.0000 mg | Freq: Four times a day (QID) | INTRAMUSCULAR | Status: DC | PRN

## 2018-04-22 MED ORDER — OXYCODONE HCL 5 MG PO TABS: 5.0000 mg | ORAL_TABLET | ORAL | Status: DC | PRN

## 2018-04-22 MED ORDER — LIDOCAINE HCL (PF) 1 % IJ SOLN
INTRAMUSCULAR | Status: AC
  Filled 2018-04-22: qty 30

## 2018-04-22 MED ORDER — NITROGLYCERIN 1 MG/10 ML FOR IR/CATH LAB
INTRA_ARTERIAL | Status: DC | PRN
  Administered 2018-04-22 (×2): 250 ug

## 2018-04-22 MED ORDER — FENTANYL CITRATE (PF) 100 MCG/2ML IJ SOLN
INTRAMUSCULAR | Status: DC | PRN
  Administered 2018-04-22 (×2): 50 ug via INTRAVENOUS

## 2018-04-22 MED ORDER — HEPARIN SODIUM (PORCINE) 1000 UNIT/ML IJ SOLN
INTRAMUSCULAR | Status: DC | PRN
  Administered 2018-04-22: 4000 [IU] via INTRAVENOUS

## 2018-04-22 MED ORDER — MIDAZOLAM HCL 2 MG/2ML IJ SOLN
INTRAMUSCULAR | Status: AC
  Filled 2018-04-22: qty 4

## 2018-04-22 MED ORDER — NITROGLYCERIN 5 MG/ML IV SOLN
INTRAVENOUS | Status: AC
  Filled 2018-04-22: qty 10

## 2018-04-22 MED ORDER — FENTANYL CITRATE (PF) 100 MCG/2ML IJ SOLN
INTRAMUSCULAR | Status: AC
  Filled 2018-04-22: qty 2

## 2018-04-22 MED ORDER — SODIUM CHLORIDE 0.9% FLUSH: 3.0000 mL | Freq: Two times a day (BID) | INTRAVENOUS | Status: DC

## 2018-04-22 MED ORDER — SODIUM CHLORIDE 0.9 % IV SOLN
INTRAVENOUS | Status: AC
  Administered 2018-04-22: 14:00:00 via INTRAVENOUS

## 2018-04-22 MED ORDER — LIDOCAINE-EPINEPHRINE (PF) 1 %-1:200000 IJ SOLN
INTRAMUSCULAR | Status: AC
  Filled 2018-04-22: qty 10

## 2018-04-22 MED ORDER — SODIUM CHLORIDE 0.9% FLUSH: 3.0000 mL | INTRAVENOUS | Status: DC | PRN

## 2018-04-22 MED ORDER — SODIUM CHLORIDE 0.9 % IV SOLN
INTRAVENOUS | Status: DC
  Administered 2018-04-22: 09:00:00 via INTRAVENOUS

## 2018-04-22 MED ORDER — CEFAZOLIN SODIUM-DEXTROSE 1-4 GM/50ML-% IV SOLN
INTRAVENOUS | Status: AC
  Filled 2018-04-22: qty 50

## 2018-04-22 MED ORDER — ACETAMINOPHEN 325 MG PO TABS: 650.0000 mg | ORAL_TABLET | ORAL | Status: DC | PRN

## 2018-04-22 MED ORDER — MIDAZOLAM HCL 2 MG/2ML IJ SOLN
INTRAMUSCULAR | Status: DC | PRN
  Administered 2018-04-22: 1 mg via INTRAVENOUS
  Administered 2018-04-22: 2 mg via INTRAVENOUS
  Administered 2018-04-22: 1 mg via INTRAVENOUS

## 2018-04-22 MED ORDER — HYDRALAZINE HCL 20 MG/ML IJ SOLN: 5.0000 mg | INTRAMUSCULAR | Status: DC | PRN

## 2018-04-22 MED ORDER — HEPARIN SODIUM (PORCINE) 1000 UNIT/ML IJ SOLN
INTRAMUSCULAR | Status: AC
  Filled 2018-04-22: qty 1

## 2018-04-22 MED ORDER — LABETALOL HCL 5 MG/ML IV SOLN: 10.0000 mg | INTRAVENOUS | Status: DC | PRN

## 2018-04-22 MED ORDER — SODIUM CHLORIDE 0.9 % IV SOLN: 250.0000 mL | INTRAVENOUS | Status: DC | PRN

## 2018-04-22 MED ORDER — SODIUM CHLORIDE (PF) 0.9 % IJ SOLN
INTRAMUSCULAR | Status: AC
  Filled 2018-04-22: qty 50

## 2018-04-22 MED ORDER — IOPAMIDOL (ISOVUE-300) INJECTION 61%
INTRAVENOUS | Status: DC | PRN
  Administered 2018-04-22: 50 mL via INTRAVENOUS

## 2018-04-22 SURGICAL SUPPLY — 30 items
BALLN LUTONIX 4X220X130 (BALLOONS) ×2
BALLN LUTONIX 5X150X130 (BALLOONS) ×2
BALLN ULTRASCORE 014 2X200X150 (BALLOONS) ×2
BALLN ULTRASCORE 014 3X40X150 (BALLOONS) ×2
BALLN ULTRASCORE 014 4X40X150 (BALLOONS) ×2
BALLN ULTRASCORE 4X150X130 (BALLOONS) ×2
BALLN ULTRASCORE 6X40X130 (BALLOONS) ×2
BALLOON LUTONIX 4X220X130 (BALLOONS) ×1 IMPLANT
BALLOON LUTONIX 5X150X130 (BALLOONS) ×1 IMPLANT
BALLOON ULTRASCORE 4X150X130 (BALLOONS) ×1 IMPLANT
BALLOON ULTRASCORE 6X40X130 (BALLOONS) ×1 IMPLANT
BALLOON ULTRSCRE 014 2X200X150 (BALLOONS) ×1 IMPLANT
BALLOON ULTRSCRE 014 3X40X150 (BALLOONS) ×1 IMPLANT
BALLOON ULTRSCRE 014 4X40X150 (BALLOONS) ×1 IMPLANT
CATH BEACON 5 .035 65 RIM TIP (CATHETERS) ×2 IMPLANT
CATH SEEKER .035X90 (CATHETERS) ×2 IMPLANT
DEVICE CLOSURE MYNXGRIP 5F (Vascular Products) ×2 IMPLANT
DEVICE PRESTO INFLATION (MISCELLANEOUS) ×2 IMPLANT
DEVICE SAFEGUARD 24CM (GAUZE/BANDAGES/DRESSINGS) ×2 IMPLANT
NEEDLE ENTRY 21GA 7CM ECHOTIP (NEEDLE) ×2 IMPLANT
PACK ANGIOGRAPHY (CUSTOM PROCEDURE TRAY) ×2 IMPLANT
SET INTRO CAPELLA COAXIAL (SET/KITS/TRAYS/PACK) ×2 IMPLANT
SHEATH BRITE TIP 5FRX11 (SHEATH) ×4 IMPLANT
SHEATH BRITE TIP 6FRX5.5 (SHEATH) ×2 IMPLANT
SHIELD RADPAD SCOOP 12X17 (MISCELLANEOUS) ×2 IMPLANT
WIRE J 3MM .035X145CM (WIRE) ×2 IMPLANT
WIRE MAGIC TOR.035 180C (WIRE) ×2 IMPLANT
WIRE MAGIC TORQUE 260C (WIRE) ×2 IMPLANT
WIRE SPARTACORE .014X190CM (WIRE) ×2 IMPLANT
WIRE SPARTACORE .014X300CM (WIRE) ×2 IMPLANT

## 2018-04-22 NOTE — Op Note (Signed)
[facility] Percutaneous Study/Intervention Procedural Note   Date of Surgery: 04/22/2018  Surgeon: Hortencia Pilar  Pre-operative Diagnosis: Atherosclerotic occlusive disease bilateral lower extremities with lifestyle limiting claudication of the right lower extremity.  Post-operative diagnosis: Same  Procedure(s) Performed: 1. Introduction catheter into right lower extremity 3rd order catheter placement  2. Contrast injection right lower extremity for distal runoff  3. Percutaneous transluminal angioplasty right superficial femoral and popliteal arteries to 5 mm distally and 6 mm at the origin of the SFA 4. Percutaneous transluminal angioplasty right posterior tibial to 2 mm in its proximal two thirds and then 3 mm from the origin into the tibioperoneal trunk  5. Percutaneous transluminal angioplasty of the right profunda femoris to 4 mm proximally              6.  minx closure right common femoral arteriotomy  Anesthesia: Conscious sedation was administered under my direct supervision by the interventional radiology RN. IV Versed plus fentanyl were utilized. Continuous ECG, pulse oximetry and blood pressure was monitored throughout the entire procedure.  Conscious sedation was for a total of 2 hour 14 minutes.  Sheath: 2 5 French Pinnacle sheaths placed in the right common femoral antegrade approach  Contrast: 50 cc  Fluoroscopy Time: 10.5 minutes  Indications: Sanye Ledesma presents with increasing pain of the right lower extremity.  She recently underwent intervention of the left lower extremity for limb salvage and has done very well from this treatment.  She return to the office complaining of increasing pain on the right creating lifestyle limitation and requested that this side be treated as well.  The risks and benefits are reviewed all questions answered patient agrees to  proceed.  Procedure:Sudiksha Lupi is a 72 y.o. y.o. female who was identified and appropriate procedural time out was performed. The patient was then placed supine on the table and prepped and draped in the usual sterile fashion.   Ultrasound was placed in the sterile sleeve and the right groin was evaluated the right common femoral artery was echolucent and pulsatile indicating patency. Image was recorded for the permanent record and under real-time visualization a microneedle was inserted into the common femoral artery followed by the microwire and then the micro-sheath. A J-wire was then advanced through the micro-sheath and a 5 [place] sheath was then inserted over a J-wire.  Hand-injection of contrast through the sheath demonstrated that this was in the profunda femoris and that the 90% stenosis near the origin had been crossed by the micro wire.  Attempts at pulling back the sheath so that access could be obtained to the SFA were unsuccessful.  The access point of the common femoral was too close to the true bifurcation.  I therefore elected to leave this sheath in position as it had crossed the profound profunda lesion so that this could be treated later in the case.  The ultrasound was replaced into a new sterile sleeve and the right groin was evaluated the right common femoral artery was echolucent and pulsatile indicating patency. Image was recorded for the permanent record and under real-time visualization a microneedle was inserted into the common femoral artery followed by the microwire and then the micro-sheath.  Prior to inserting the micro wire fluoroscopy demonstrated that the microwire was now in the superficial femoral artery.  Wire passage was likely aided by the initial sheath being in the profunda. A J-wire was then advanced through the micro-sheath and a 5 [place] sheath was then inserted over  a J-wire.    Hand-injection of contrast was then utilized to demonstrate the anatomy of  the right lower extremity.  Imaging demonstrated a 70% stenosis at the origin of the right SFA.  At approximately the midportion of the SFA the diffuse mild to moderate disease increased creating a long segment of greater than 80% stenosis extending into the below-knee popliteal.  There were multiple tandem lesions in some areas the stenosis approached 90%.  The trifurcation was heavily diseased with occlusion of the anterior tibial.  Tibioperoneal trunk demonstrated patency in its proximal two thirds but distally at the bifurcation of the peroneal and posterior tibial there was a 60 to 70% stenosis.  Within the posterior tibial there were multiple lesions identified all of which were greater than 80%.  Distally the posterior tibial did fill the lateral plantar vessels and was the dominant runoff to the foot.  The peroneal is patent throughout its course but collateralizes poorly across the ankle.  Based on these images I decided to proceed with vascular intervention.  4000 units of heparin was then given and allowed to circulate for several minutes.  Straight catheter and stiff angle Glidewire were then negotiated down into the posterior tibial artery.  The detector was then positioned over the SFA and popliteal arteries and they were reimaged, again the diffuse greater than 80% stenosis is noted in this area and after appropriate measurements are made a 4 mm x 150 mm ultra score balloon was used beginning distally.  2 separate angioplasties were performed, inflations were to 12 atm for 1 minute.  Following this, a 5 mm x 22 cm Lutonix drug-eluting balloon was used to angioplasty the superficial femoral and popliteal arteries. Inflation was to 12 atmospheres for 2 minutes.  The lesion at the origin of the SFA was then addressed inflation with a 6 mm x 40 mm ultra score balloon was performed to 10 atm for 1 minute.  Follow-up imaging demonstrated patency throughout the SFA and popliteal with less than 5%  residual stenosis. Distal runoff was then reassessed and noted to be unchanged.  The 035 wire was then exchanged for a 0.014 over a seeker catheter.  A 2 mm x 200 ultra score balloon was then advanced across the proximal two thirds of the posterior tibial artery and inflation was to 12 atm for 1 minute.  Follow-up imaging demonstrated persistent stenosis at the origin extending into the distal tibioperoneal trunk and a 3 mm x 40 mm ultra score balloon was advanced across this area inflation was to 12 atm for 1 minute.  Follow-up imaging now demonstrated the tibioperoneal trunk and posterior tibial be patent filling the plantar vessels and the pedal arch.  There is less than 5% residual stenosis throughout their course.  Peroneal arteries patency is maintained.  A 0.014 wire was then advanced through the sheath and the profunda femoris a 4 mm x 40 mm ultra score balloon was positioned across the profunda lesion.  The sheath was pulled back into the common femoral and inflation was to 12 atm for 1 minute.  Follow-up imaging demonstrated less than 50% residual stenosis.  After review of these images minx devices are used to close both sheaths.  The SFA sheath sheath worked well with good hemostasis the profunda femoris sheath did not yield good hemostasis and manual pressure was held in the right groin for 15 minutes.  Findings:  Imaging demonstrated a 70% stenosis at the origin of the right SFA.  At approximately the  midportion of the SFA the diffuse mild to moderate disease increased creating a long segment of greater than 80% stenosis extending into the below-knee popliteal.  There were multiple tandem lesions in some areas the stenosis approached 90%.  The trifurcation was heavily diseased with occlusion of the anterior tibial.  Tibioperoneal trunk demonstrated patency in its proximal two thirds but distally at the bifurcation of the peroneal and posterior tibial there was a 60 to 70% stenosis.  Within  the posterior tibial there were multiple lesions identified all of which were greater than 80%.  Distally the posterior tibial did fill the lateral plantar vessels and was the dominant runoff to the foot.  The peroneal is patent throughout its course but collateralizes poorly across the ankle.   Following angioplasty posterior tibial now is in-line flow and looks quite nice with less than 5% residual stenosis. Angioplasty of the SFA at Hunter's canal yields an excellent result with less than 5% % residual stenosis.  There is less than 50% residual stenosis in the right profunda femoris.  Summary: Successful recanalization right lower extremity for limb salvage   Disposition: Patient was taken to the recovery room in stable condition having tolerated the procedure well.  Belenda Cruise Revis Whalin 04/22/2018,6:37 PM

## 2018-04-22 NOTE — H&P (Signed)
 @LOGO @   MRN : [id number]  Crystal Miller is a 72 y.o. (03/16/1946) female who presents with chief complaint of No chief complaint on file. Marland Kitchen.  History of Present Illness:   The patient returns to the office for followup and review of the noninvasive studies. There has been a significant deterioration in the right lower extremity symptoms.  The patient notes interval shortening of their right leg claudication distance and development of mild rest pain symptoms. No new ulcers or wounds have occurred since the last visit.  There have been no significant changes to the patient's overall health care.  The patient denies amaurosis fugax or recent TIA symptoms. There are no recent neurological changes noted. The patient denies history of DVT, PE or superficial thrombophlebitis. The patient denies recent episodes of angina or shortness of breath.     Current Meds  Medication Sig  . acetaminophen (TYLENOL) 500 MG tablet Take 1,000 mg by mouth every 6 (six) hours as needed (for pain.).  Marland Kitchen. alendronate (FOSAMAX) 70 MG tablet Take 1 tablet (70 mg total) by mouth every 7 (seven) days. Take with a full glass of water on an empty stomach. (Patient taking differently: Take 70 mg by mouth every Sunday. Take with a full glass of water on an empty stomach.)  . amLODipine (NORVASC) 10 MG tablet Take 1 tablet (10 mg total) by mouth daily.  Marland Kitchen. aspirin EC 81 MG EC tablet Take 1 tablet (81 mg total) by mouth daily. (Patient taking differently: Take 81 mg by mouth daily. In the morning)  . atorvastatin (LIPITOR) 40 MG tablet TAKE 1 TABLET BY MOUTH ONCE DAILY AT 6PM (Patient taking differently: Take 40 mg by mouth daily. )  . carvedilol (COREG) 3.125 MG tablet Take 1 tablet (3.125 mg total) by mouth 2 (two) times daily with a meal.  . clopidogrel (PLAVIX) 75 MG tablet Take 1 tablet (75 mg total) by mouth daily.  . empagliflozin (JARDIANCE) 10 MG TABS tablet Take 10 mg by mouth daily.  . Multiple Vitamin (MULTIVITAMIN  WITH MINERALS) TABS tablet Take 1 tablet by mouth daily. Centrum Silver . In the morning  . Polyethyl Glycol-Propyl Glycol (LUBRICANT EYE DROPS) 0.4-0.3 % SOLN Place 1 drop into both eyes 3 (three) times daily as needed (for dry eyes.).  Marland Kitchen. sertraline (ZOLOFT) 25 MG tablet Take 1 tablet (25 mg total) by mouth daily.  . vitamin B-12 (CYANOCOBALAMIN) 1000 MCG tablet Take 1,000 mcg by mouth daily.     Past Medical History:  Diagnosis Date  . Depression    husband died in 2017  . DM (diabetes mellitus), 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  . Essential hypertension 11/01/2016  . GERD (gastroesophageal reflux disease)   . Mixed hyperlipidemia 11/01/2016  . Peripheral vascular disease (HCC)   . Renal insufficiency   . Stroke (HCC) 2018   slight residual with speech. does not drive Lt side weakness    Past Surgical History:  Procedure Laterality Date  . CHOLECYSTECTOMY    . LOWER EXTREMITY ANGIOGRAPHY Left 12/31/2017   Procedure: LOWER EXTREMITY ANGIOGRAPHY;  Surgeon: Renford DillsSchnier, Rickie Gange G, MD;  Location: [facility]IVE [facility];  Service: Cardiovascular;  Laterality: Left;    Social History Social History   Tobacco Use  . Smoking status: Former Smoker    Packs/day: 1.00    Years: 20.00    Pack years: 20.00    Types: Cigarettes    Last attempt to quit: 2012    Years since quitting: 7.9  .  Smokeless tobacco: Never Used  Substance Use Topics  . Alcohol use: No  . Drug use: No    Family History Family History  Problem Relation Age of Onset  . Alzheimer's disease Mother   . Arthritis Mother   . Hypertension Mother   . CVA Father   . CAD Father   . Heart disease Father   . Stroke Father   . Hypertension Father   . Stroke Brother   . Breast cancer Neg Hx     Allergies  Allergen Reactions  . Metformin And Related Diarrhea     REVIEW OF SYSTEMS (Negative unless checked)  Constitutional: [] Weight loss  [] Fever  [] Chills Cardiac: [] Chest pain   [] Chest pressure    [] Palpitations   [] Shortness of breath when laying flat   [] Shortness of breath with exertion. Vascular:  [x] Pain in legs with walking   [x] Pain in legs at rest  [] History of DVT   [] Phlebitis   [] Swelling in legs   [] Varicose veins   [] Non-healing ulcers Pulmonary:   [] Uses home oxygen   [] Productive cough   [] Hemoptysis   [] Wheeze  [] COPD   [] Asthma Neurologic:  [] Dizziness   [] Seizures   [] History of stroke   [] History of TIA  [] Aphasia   [] Vissual changes   [] Weakness or numbness in arm   [] Weakness or numbness in leg Musculoskeletal:   [] Joint swelling   [] Joint pain   [] Low back pain Hematologic:  [] Easy bruising  [] Easy bleeding   [] Hypercoagulable state   [] Anemic Gastrointestinal:  [] Diarrhea   [] Vomiting  [] Gastroesophageal reflux/heartburn   [] Difficulty swallowing. Genitourinary:  [] Chronic kidney disease   [] Difficult urination  [] Frequent urination   [] Blood in urine Skin:  [] Rashes   [] Ulcers  Psychological:  [] History of anxiety   []  History of major depression.  Physical Examination  There were no vitals filed for this visit. There is no height or weight on file to calculate BMI. Gen: WD/WN, NAD Head: [place]/AT, No temporalis wasting.  Ear/Nose/Throat: Hearing grossly intact, nares w/o erythema or drainage Eyes: PER, EOMI, sclera nonicteric.  Neck: Supple, no large masses.   Pulmonary:  Good air movement, no audible wheezing bilaterally, no use of accessory muscles.  Cardiac: RRR, no JVD Vascular:   Vessel Right Left  Radial Palpable Palpable  Popliteal Not Palpable Palpable  PT Not Palpable Trace Palpable  DP Not Palpable Trace Palpable  Gastrointestinal: Non-distended. No guarding/no peritoneal signs.  Musculoskeletal: M/S 5/5 throughout.  No deformity or atrophy.  Neurologic: CN 2-12 intact. Symmetrical.  Speech is fluent. Motor exam as listed above. Psychiatric: Judgment intact, Mood & affect appropriate for pt's clinical situation. Dermatologic: No rashes or ulcers  noted.  No changes consistent with cellulitis. Lymph : No lichenification or skin changes of chronic lymphedema.  CBC Lab Results  Component Value Date   WBC 8.0 10/30/2017   HGB 12.1 10/30/2017   HCT 35.8 (L) 10/30/2017   MCV 90.4 10/30/2017   PLT 326.0 10/30/2017    BMET    Component Value Date/Time   NA 136 04/10/2018 1400   K 4.9 04/10/2018 1400   CL 104 04/10/2018 1400   CO2 24 04/10/2018 1400   GLUCOSE 127 (H) 04/10/2018 1400   BUN 31 (H) 04/21/2018 1054   CREATININE 1.21 (H) 04/21/2018 1054   CALCIUM 9.6 04/10/2018 1400   GFRNONAA 44 (L) 04/21/2018 1054   GFRAA 51 (L) 04/21/2018 1054   Estimated Creatinine Clearance: 38 mL/min (A) (by C-G formula based on SCr of  1.21 mg/dL (H)).  COAG No results found for: INR, PROTIME  Radiology No results found.    Assessment/Plan 1.  Atherosclerotic occlusive disease bilateral lower extremities with claudication [facility]):  Left lower extremity feels better following angiogram, patient and daughter concerned about right lower extremity.  Patient is now describing lifestyle limiting claudication symptoms.  Recommend:  The patient has experienced worsening symptoms of the right lower extremity and is now describing lifestyle limiting claudication of her right lower extremity.  Given the severity of the patient's right lower extremity symptoms the patient should undergo right lower extremity angiography and intervention.  Risk and benefits were reviewed the patient.  Indications for the procedure were reviewed.  All questions were answered, the patient agrees to proceed with right lower extremity angiography and intervention.   The patient should continue walking and begin a more formal exercise program.  The patient should continue antiplatelet therapy and aggressive treatment of the lipid abnormalities.  The patient wishes to undergo procedure after Thanksgiving.   The patient will follow up with me after the angiogram.    2. DM (diabetes mellitus), type 2 with complications (HCC) Continue hypoglycemic medications as already ordered, these medications have been reviewed and there are no changes at this time.  Hgb A1C to be monitored as already arranged by primary service   3. Essential hypertension Continue antihypertensive medications as already ordered, these medications have been reviewed and there are no changes at this time.    Levora Dredge, MD  04/22/2018 7:42 AM

## 2018-04-22 NOTE — Discharge Instructions (Signed)
°Moderate Conscious Sedation, Adult, Care After °These instructions provide you with information about caring for yourself after your procedure. Your health care provider may also give you more specific instructions. Your treatment has been planned according to current medical practices, but problems sometimes occur. Call your health care provider if you have any problems or questions after your procedure. °What can I expect after the procedure? °After your procedure, it is common: °· To feel sleepy for several hours. °· To feel clumsy and have poor balance for several hours. °· To have poor judgment for several hours. °· To vomit if you eat too soon. ° °Follow these instructions at home: °For at least 24 hours after the procedure: ° °· Do not: °? Participate in activities where you could fall or become injured. °? Drive. °? Use heavy machinery. °? Drink alcohol. °? Take sleeping pills or medicines that cause drowsiness. °? Make important decisions or sign legal documents. °? Take care of children on your own. °· Rest. °Eating and drinking °· Follow the diet recommended by your health care provider. °· If you vomit: °? Drink water, juice, or soup when you can drink without vomiting. °? Make sure you have little or no nausea before eating solid foods. °General instructions °· Have a responsible adult stay with you until you are awake and alert. °· Take over-the-counter and prescription medicines only as told by your health care provider. °· If you smoke, do not smoke without supervision. °· Keep all follow-up visits as told by your health care provider. This is important. °Contact a health care provider if: °· You keep feeling nauseous or you keep vomiting. °· You feel light-headed. °· You develop a rash. °· You have a fever. °Get help right away if: °· You have trouble breathing. °This information is not intended to replace advice given to you by your health care provider. Make sure you discuss any questions you  have with your health care provider. °Document Released: 02/25/2013 Document Revised: 10/10/2015 Document Reviewed: 08/27/2015 °Elsevier Interactive Patient Education © 2018 [employer] ° ° ° °Femoral Site Care °Refer to this sheet in the next few weeks. These instructions provide you with information about caring for yourself after your procedure. Your health care provider may also give you more specific instructions. Your treatment has been planned according to current medical practices, but problems sometimes occur. Call your health care provider if you have any problems or questions after your procedure. °What can I expect after the procedure? °After your procedure, it is typical to have the following: °· Bruising at the site that usually fades within 1-2 weeks. °· Blood collecting in the tissue (hematoma) that may be painful to the touch. It should usually decrease in size and tenderness within 1-2 weeks. ° °Follow these instructions at home: °· Take medicines only as directed by your health care provider. °· You may shower 24-48 hours after the procedure or as directed by your health care provider. Remove the bandage (dressing) and gently wash the site with plain soap and water. Pat the area dry with a clean towel. Do not rub the site, because this may cause bleeding. °· Do not take baths, swim, or use a hot tub until your health care provider approves. °· Check your insertion site every day for redness, swelling, or drainage. °· Do not apply powder or lotion to the site. °· Limit use of stairs to twice a day for the first 2-3 days or as directed by your health care   provider. °· Do not squat for the first 2-3 days or as directed by your health care provider. °· Do not lift over 10 lb (4.5 kg) for 5 days after your procedure or as directed by your health care provider. °· Ask your health care provider when it is okay to: °? Return to work or school. °? Resume usual physical activities or sports. °? Resume  sexual activity. °· Do not drive home if you are discharged the same day as the procedure. Have someone else drive you. °· You may drive 24 hours after the procedure unless otherwise instructed by your health care provider. °· Do not operate machinery or power tools for 24 hours after the procedure or as directed by your health care provider. °· If your procedure was done as an outpatient procedure, which means that you went home the same day as your procedure, a responsible adult should be with you for the first 24 hours after you arrive home. °· Keep all follow-up visits as directed by your health care provider. This is important. °Contact a health care provider if: °· You have a fever. °· You have chills. °· You have increased bleeding from the site. Hold pressure on the site. °Get help right away if: °· You have unusual pain at the site. °· You have redness, warmth, or swelling at the site. °· You have drainage (other than a small amount of blood on the dressing) from the site. °· The site is bleeding, and the bleeding does not stop after 30 minutes of holding steady pressure on the site. °· Your leg or foot becomes pale, cool, tingly, or numb. °This information is not intended to replace advice given to you by your health care provider. Make sure you discuss any questions you have with your health care provider. °Document Released: 01/08/2014 Document Revised: 10/13/2015 Document Reviewed: 11/24/2013 °Elsevier Interactive Patient Education © 2018 [employer] ° °

## 2018-04-23 ENCOUNTER — Ambulatory Visit: Payer: Medicare HMO

## 2018-04-23 ENCOUNTER — Encounter: Payer: Self-pay | Admitting: Vascular Surgery

## 2018-04-29 ENCOUNTER — Other Ambulatory Visit (INDEPENDENT_AMBULATORY_CARE_PROVIDER_SITE_OTHER): Payer: Self-pay | Admitting: Vascular Surgery

## 2018-04-29 DIAGNOSIS — I70211 Atherosclerosis of native arteries of extremities with intermittent claudication, right leg: Secondary | ICD-10-CM

## 2018-04-29 DIAGNOSIS — Z9862 Peripheral vascular angioplasty status: Secondary | ICD-10-CM

## 2018-04-30 ENCOUNTER — Ambulatory Visit: Payer: Medicare HMO

## 2018-05-03 ENCOUNTER — Other Ambulatory Visit: Payer: Self-pay | Admitting: Family Medicine

## 2018-05-12 ENCOUNTER — Encounter (INDEPENDENT_AMBULATORY_CARE_PROVIDER_SITE_OTHER): Payer: Medicare HMO

## 2018-05-12 ENCOUNTER — Ambulatory Visit (INDEPENDENT_AMBULATORY_CARE_PROVIDER_SITE_OTHER): Payer: Medicare HMO | Admitting: Vascular Surgery

## 2018-05-15 ENCOUNTER — Ambulatory Visit (INDEPENDENT_AMBULATORY_CARE_PROVIDER_SITE_OTHER): Payer: Medicare HMO

## 2018-05-15 ENCOUNTER — Encounter (INDEPENDENT_AMBULATORY_CARE_PROVIDER_SITE_OTHER): Payer: Self-pay | Admitting: Vascular Surgery

## 2018-05-15 ENCOUNTER — Ambulatory Visit (INDEPENDENT_AMBULATORY_CARE_PROVIDER_SITE_OTHER): Payer: Medicare HMO | Admitting: Vascular Surgery

## 2018-05-15 VITALS — BP 154/67 | HR 68 | Resp 18 | Ht 63.0 in | Wt 143.2 lb

## 2018-05-15 DIAGNOSIS — Z87891 Personal history of nicotine dependence: Secondary | ICD-10-CM | POA: Diagnosis not present

## 2018-05-15 DIAGNOSIS — E118 Type 2 diabetes mellitus with unspecified complications: Secondary | ICD-10-CM | POA: Diagnosis not present

## 2018-05-15 DIAGNOSIS — Z9862 Peripheral vascular angioplasty status: Secondary | ICD-10-CM | POA: Diagnosis not present

## 2018-05-15 DIAGNOSIS — I70211 Atherosclerosis of native arteries of extremities with intermittent claudication, right leg: Secondary | ICD-10-CM | POA: Diagnosis not present

## 2018-05-15 DIAGNOSIS — I10 Essential (primary) hypertension: Secondary | ICD-10-CM

## 2018-05-15 DIAGNOSIS — E782 Mixed hyperlipidemia: Secondary | ICD-10-CM

## 2018-05-15 DIAGNOSIS — I739 Peripheral vascular disease, unspecified: Secondary | ICD-10-CM

## 2018-05-15 NOTE — Progress Notes (Signed)
MRN : [id number]  Crystal Miller is a 72 y.o. (12/26/1945) female who presents with chief complaint of No chief complaint on file. Marland Kitchen  History of Present Illness:   The patient returns to the office for followup and review status post angiogram with intervention on 04/22/2018.   1.  Percutaneous transluminal angioplasty right superficial femoral and popliteal arteries to 5 mm distally and 6 mm at the origin of the SFA 2.  Percutaneous transluminal angioplasty right posterior tibial to 2 mm in its proximal two thirds and then 3 mm from the origin into the tibioperoneal trunk 3.  Percutaneous transluminal angioplasty of the right profunda femoris to 4 mm proximally  The patient notes improvement in the lower extremity symptoms. No interval shortening of the patient's claudication distance or rest pain symptoms. Previous wounds have now healed.  No new ulcers or wounds have occurred since the last visit.  There have been no significant changes to the patient's overall health care.  The patient denies amaurosis fugax or recent TIA symptoms. There are no recent neurological changes noted. The patient denies history of DVT, PE or superficial thrombophlebitis. The patient denies recent episodes of angina or shortness of breath.   ABI's Rt=0.99 and Lt=1.02  (previous ABI's Rt=1.59 and Lt=1.07)   No outpatient medications have been marked as taking for the 05/15/18 encounter (Appointment) with [name], [name], MD.    Past Medical History:  Diagnosis Date  . Depression    husband died in 2015/09/27  . DM (diabetes mellitus), 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  . Essential hypertension 11/01/2016  . GERD (gastroesophageal reflux disease)   . Mixed hyperlipidemia 11/01/2016  . Peripheral vascular disease (HCC)   . Renal insufficiency   . Stroke (HCC) 2016/09/26   slight residual with speech. does not drive Lt side weakness    Past Surgical History:  Procedure Laterality Date  .  CHOLECYSTECTOMY    . LOWER EXTREMITY ANGIOGRAPHY Left 12/31/2017   Procedure: LOWER EXTREMITY ANGIOGRAPHY;  Surgeon: Renford Dills, MD;  Location: [facility] INVASIVE CV [facility];  Service: Cardiovascular;  Laterality: Left;  . LOWER EXTREMITY ANGIOGRAPHY Right 04/22/2018   Procedure: LOWER EXTREMITY ANGIOGRAPHY;  Surgeon: Renford Dills, MD;  Location: [facility] INVASIVE CV [facility];  Service: Cardiovascular;  Laterality: Right;    Social History Social History   Tobacco Use  . Smoking status: Former Smoker    Packs/day: 1.00    Years: 20.00    Pack years: 20.00    Types: Cigarettes    Last attempt to quit: 27-Sep-2010    Years since quitting: 7.9  . Smokeless tobacco: Never Used  Substance Use Topics  . Alcohol use: No  . Drug use: No    Family History Family History  Problem Relation Age of Onset  . Alzheimer's disease Mother   . Arthritis Mother   . Hypertension Mother   . CVA Father   . CAD Father   . Heart disease Father   . Stroke Father   . Hypertension Father   . Stroke Brother   . Breast cancer Neg Hx     Allergies  Allergen Reactions  . Metformin And Related Diarrhea     REVIEW OF SYSTEMS (Negative unless checked)  Constitutional: [] Weight loss  [] Fever  [] Chills Cardiac: [] Chest pain   [] Chest pressure   [] Palpitations   [] Shortness of breath when laying flat   [] Shortness of breath with exertion. Vascular:  [x] Pain in legs with walking   [] Pain in  legs at rest  [] History of DVT   [] Phlebitis   [] Swelling in legs   [] Varicose veins   [] Non-healing ulcers Pulmonary:   [] Uses home oxygen   [] Productive cough   [] Hemoptysis   [] Wheeze  [] COPD   [] Asthma Neurologic:  [] Dizziness   [] Seizures   [] History of stroke   [] History of TIA  [] Aphasia   [] Vissual changes   [] Weakness or numbness in arm   [] Weakness or numbness in leg Musculoskeletal:   [] Joint swelling   [x] Joint pain   [] Low back pain Hematologic:  [] Easy bruising  [] Easy bleeding   [] Hypercoagulable state    [] Anemic Gastrointestinal:  [] Diarrhea   [] Vomiting  [] Gastroesophageal reflux/heartburn   [] Difficulty swallowing. Genitourinary:  [] Chronic kidney disease   [] Difficult urination  [] Frequent urination   [] Blood in urine Skin:  [] Rashes   [] Ulcers  Psychological:  [] History of anxiety   []  History of major depression.  Physical Examination  There were no vitals filed for this visit. There is no height or weight on file to calculate BMI. Gen: WD/WN, NAD Head: Latty/AT, No temporalis wasting.  Ear/Nose/Throat: Hearing grossly intact, nares w/o erythema or drainage Eyes: PER, EOMI, sclera nonicteric.  Neck: Supple, no large masses.   Pulmonary:  Good air movement, no audible wheezing bilaterally, no use of accessory muscles.  Cardiac: RRR, no JVD Vascular: Vessel Right Left  Radial Palpable Palpable  PT Palpable Palpable  DP Not Palpable Not Palpable  Gastrointestinal: Non-distended. No guarding/no peritoneal signs.  Musculoskeletal: M/S 5/5 throughout.  No deformity or atrophy.  Neurologic: CN 2-12 intact. Symmetrical.  Speech is fluent. Motor exam as listed above. Psychiatric: Judgment intact, Mood & affect appropriate for pt's clinical situation. Dermatologic: No rashes or ulcers noted.  No changes consistent with cellulitis. Lymph : No lichenification or skin changes of chronic lymphedema.  CBC Lab Results  Component Value Date   WBC 8.0 10/30/2017   HGB 12.1 10/30/2017   HCT 35.8 (L) 10/30/2017   MCV 90.4 10/30/2017   PLT 326.0 10/30/2017    BMET    Component Value Date/Time   NA 136 04/10/2018 1400   K 4.9 04/10/2018 1400   CL 104 04/10/2018 1400   CO2 24 04/10/2018 1400   GLUCOSE 127 (H) 04/10/2018 1400   BUN 31 (H) 04/21/2018 1054   CREATININE 1.21 (H) 04/21/2018 1054   CALCIUM 9.6 04/10/2018 1400   GFRNONAA 44 (L) 04/21/2018 1054   GFRAA 51 (L) 04/21/2018 1054   CrCl cannot be calculated (Patient's most recent lab result is older than the maximum 21 days  allowed.).  COAG No results found for: INR, PROTIME  Radiology No results found.   Assessment/Plan 1. PAD (peripheral artery disease) (HCC) Recommend:  The patient is status post successful angiogram with intervention.  The patient reports that the claudication symptoms and leg pain is essentially gone.   The patient denies lifestyle limiting changes at this point in time.  No further invasive studies, angiography or surgery at this time The patient should continue walking and begin a more formal exercise program.  The patient should continue antiplatelet therapy and aggressive treatment of the lipid abnormalities  Smoking cessation was again discussed  The patient should continue wearing graduated compression socks 10-15 mmHg strength to control the mild edema.  Patient should undergo noninvasive studies as ordered. The patient will follow up with me after the studies.   - VAS US LOWER EXTREMITY ARTERIAL DUPLEX; Future - VAS US ABI WITH/WO TBI; Future  2.  Essential hypertension Continue antihypertensive medications as already ordered, these medications have been reviewed and there are no changes at this time.   3. DM (diabetes mellitus), type 2 with complications (HCC) Continue hypoglycemic medications as already ordered, these medications have been reviewed and there are no changes at this time.  Hgb A1C to be monitored as already arranged by primary service   4. Mixed hyperlipidemia Continue statin as ordered and reviewed, no changes at this time     Levora DredgeGregory Khrystal Jeanmarie, MD  05/15/2018 2:39 PM

## 2018-05-22 ENCOUNTER — Encounter (INDEPENDENT_AMBULATORY_CARE_PROVIDER_SITE_OTHER): Payer: Self-pay | Admitting: Vascular Surgery

## 2018-05-29 ENCOUNTER — Other Ambulatory Visit: Payer: Self-pay | Admitting: Family Medicine

## 2018-05-29 DIAGNOSIS — Z1231 Encounter for screening mammogram for malignant neoplasm of breast: Secondary | ICD-10-CM

## 2018-06-03 ENCOUNTER — Ambulatory Visit (INDEPENDENT_AMBULATORY_CARE_PROVIDER_SITE_OTHER): Payer: Medicare HMO | Admitting: Family Medicine

## 2018-06-03 ENCOUNTER — Encounter: Payer: Self-pay | Admitting: Family Medicine

## 2018-06-03 VITALS — BP 120/62 | HR 66 | Temp 98.3°F | Ht 63.0 in | Wt 152.0 lb

## 2018-06-03 DIAGNOSIS — I10 Essential (primary) hypertension: Secondary | ICD-10-CM | POA: Diagnosis not present

## 2018-06-03 DIAGNOSIS — E118 Type 2 diabetes mellitus with unspecified complications: Secondary | ICD-10-CM | POA: Diagnosis not present

## 2018-06-03 DIAGNOSIS — F325 Major depressive disorder, single episode, in full remission: Secondary | ICD-10-CM

## 2018-06-03 DIAGNOSIS — R143 Flatulence: Secondary | ICD-10-CM | POA: Diagnosis not present

## 2018-06-03 DIAGNOSIS — I739 Peripheral vascular disease, unspecified: Secondary | ICD-10-CM

## 2018-06-03 DIAGNOSIS — R350 Frequency of micturition: Secondary | ICD-10-CM | POA: Diagnosis not present

## 2018-06-03 DIAGNOSIS — R69 Illness, unspecified: Secondary | ICD-10-CM | POA: Diagnosis not present

## 2018-06-03 LAB — POCT URINALYSIS DIPSTICK
Bilirubin, UA: NEGATIVE
Glucose, UA: NEGATIVE
Ketones, UA: NEGATIVE
Nitrite, UA: POSITIVE
PH UA: 5.5 (ref 5.0–8.0)
PROTEIN UA: POSITIVE — AB
Spec Grav, UA: 1.02 (ref 1.010–1.025)
UROBILINOGEN UA: 0.2 U/dL

## 2018-06-03 LAB — HEMOGLOBIN A1C: Hgb A1c MFr Bld: 7.2 % — ABNORMAL HIGH (ref 4.6–6.5)

## 2018-06-03 LAB — URINALYSIS, MICROSCOPIC ONLY: RBC / HPF: NONE SEEN (ref 0–?)

## 2018-06-03 LAB — COMPREHENSIVE METABOLIC PANEL
ALT: 21 U/L (ref 0–35)
AST: 17 U/L (ref 0–37)
Albumin: 4.2 g/dL (ref 3.5–5.2)
Alkaline Phosphatase: 57 U/L (ref 39–117)
BUN: 36 mg/dL — ABNORMAL HIGH (ref 6–23)
CHLORIDE: 101 meq/L (ref 96–112)
CO2: 26 mEq/L (ref 19–32)
Calcium: 9.9 mg/dL (ref 8.4–10.5)
Creatinine, Ser: 1.13 mg/dL (ref 0.40–1.20)
GFR: 60.56 mL/min (ref >60.00)
GLUCOSE: 194 mg/dL — AB (ref 70–99)
Potassium: 4.8 mEq/L (ref 3.5–5.1)
Sodium: 133 mEq/L — ABNORMAL LOW (ref 135–145)
Total Bilirubin: 0.4 mg/dL (ref 0.2–1.2)
Total Protein: 7.9 g/dL (ref 6.0–8.3)

## 2018-06-03 NOTE — Assessment & Plan Note (Signed)
Check urinalysis.  Refer to urology.

## 2018-06-03 NOTE — Assessment & Plan Note (Signed)
Symptoms have improved with angioplasty.  She will monitor for recurrent symptoms and follow with vascular surgery.

## 2018-06-03 NOTE — Assessment & Plan Note (Signed)
I suspect this is related to dietary issues as it occurs after she eats.  She will continue to monitor.

## 2018-06-03 NOTE — Assessment & Plan Note (Signed)
She denies any significant depression today on questioning though her PHQ 9 does reveal some depression.  Patient is interested in discontinuing her Zoloft.  I think this is reasonable and she will discontinue this.  If she has recurrence of symptoms she will let us know.

## 2018-06-03 NOTE — Assessment & Plan Note (Signed)
Check A1c.  We will discuss possible medications based on her insurance with our clinical pharmacist.

## 2018-06-03 NOTE — Patient Instructions (Signed)
Nice to see you. Please go to the pharmacy to get Shingrix for the shingles vaccine. We will refer you to a urologist. We will check lab work today and contact you with the results. You can discontinue your Zoloft.  If you have recurrence of your depression symptoms please contact us so we can restart this. We will check with our clinical pharmacist regarding diabetes medication. We will have you return for nurse BP check so we can compare your cuff to our blood pressure cuff.

## 2018-06-03 NOTE — Assessment & Plan Note (Signed)
Well-controlled in the office.  She will return for nurse visit with her wrist cuff to compare.  She will continue current medications.

## 2018-06-03 NOTE — Progress Notes (Signed)
Tommi Rumps, MD Phone: [phone]  Crystal Miller is a 73 y.o. female who presents today for f/u.  CC: Diabetes, hypertension, PAD, urinary frequency, depression, excessive gas  Diabetes: Sugars typically running 160-210.  The Jardiance was too expensive.  She has not taken this in the past week.  She notes no change to her urinary frequency.  No polydipsia.  She sees ophthalmology Thursday.  She does eat a lot of bread and pasta.  Hypertension: Varies at home from 854O to 270 systolically.  She uses a wrist cuff.  Taking amlodipine and carvedilol.  No chest pain, shortness of breath, or edema.  PAD: She underwent angioplasty.  She has followed up with vascular surgery.  She notes no claudication.  She is wearing compression stockings which have helped with her swelling.  Depression: She denies explicit depression.  She feels tired at times.  She notes no SI.  She would like to come off of her Zoloft.  Urinary frequency: Notes she is urinating frequently.  This has been chronic.  She saw nephrology and they were going to refer her to urology.  The patient never heard anything regarding this.  She notes urgency with leakage.  She notes no dysuria.  She does note her urine smells.  Excessive gas: This has been going on for some time.  Typically occurs after she eats.  She will pass gas when she stands up.  No diarrhea or constipation.  No blood in her stool.  No abdominal pain.  Social History   Tobacco Use  Smoking Status Former Smoker  . Packs/day: 1.00  . Years: 20.00  . Pack years: 20.00  . Types: Cigarettes  . Last attempt to quit: 2012  . Years since quitting: 8.0  Smokeless Tobacco Never Used     ROS see history of present illness  Objective  Physical Exam Vitals:   06/03/18 0947  BP: 120/62  Pulse: 66  Temp: 98.3 F (36.8 C)  SpO2: 98%    BP Readings from Last 3 Encounters:  06/03/18 120/62  05/15/18 (!) 154/67  04/22/18 100/87   Wt Readings from Last  3 Encounters:  06/03/18 152 lb (68.9 kg)  05/15/18 143 lb 3.2 oz (65 kg)  04/22/18 148 lb (67.1 kg)    Physical Exam Constitutional:      General: She is not in acute distress.    Appearance: She is not diaphoretic.  Cardiovascular:     Rate and Rhythm: Normal rate and regular rhythm.     Heart sounds: Normal heart sounds.  Pulmonary:     Effort: Pulmonary effort is normal.     Breath sounds: Normal breath sounds.  Abdominal:     General: Bowel sounds are normal. There is no distension.     Palpations: Abdomen is soft.     Tenderness: There is no abdominal tenderness.  Skin:    General: Skin is warm and dry.  Neurological:     Mental Status: She is alert.      Assessment/Plan: Please see individual problem list.  Essential hypertension Well-controlled in the office.  She will return for nurse visit with her wrist cuff to compare.  She will continue current medications.  PAD (peripheral artery disease) (HCC) Symptoms have improved with angioplasty.  She will monitor for recurrent symptoms and follow with vascular surgery.  DM (diabetes mellitus), type 2 with complications (HCC) Check A1c.  We will discuss possible medications based on her insurance with our clinical pharmacist.  Depression, major, single  episode, complete remission ([place]) She denies any significant depression today on questioning though her PHQ 9 does reveal some depression.  Patient is interested in discontinuing her Zoloft.  I think this is reasonable and she will discontinue this.  If she has recurrence of symptoms she will let us know.  Excessive gas I suspect this is related to dietary issues as it occurs after she eats.  She will continue to monitor.  Urine frequency Check urinalysis.  Refer to urology.   Health Maintenance: Patient will get Shingrix at the pharmacy.  She has an appointment with ophthalmology later this week.  Orders Placed This Encounter  Procedures  . Comp Met (CMET)  . HgB  A1c  . Ambulatory referral to Urology    Referral Priority:   Routine    Referral Type:   Consultation    Referral Reason:   Specialty Services Required    Requested Specialty:   Urology    Number of Visits Requested:   1  . POCT Urinalysis Dipstick    No orders of the defined types were placed in this encounter.    Tommi Rumps, MD [employer]

## 2018-06-04 ENCOUNTER — Telehealth: Payer: Self-pay | Admitting: Pharmacist

## 2018-06-04 NOTE — Telephone Encounter (Signed)
Received message from Dr. Birdie Sons regarding patient having affordability concerns with Jardiance. He asked that I reach out and screen her for patient assistance through [employer]   Contacted patient. Left HIPAA compliant message for her to return my call at her convenience.   [name] Feliz Beam, PharmD PGY2 Ambulatory Care Pharmacy Resident, [facility] Phone: [phone]

## 2018-06-05 ENCOUNTER — Telehealth: Payer: Self-pay | Admitting: Family Medicine

## 2018-06-05 MED ORDER — METFORMIN HCL ER 500 MG PO TB24: 500.0000 mg | ORAL_TABLET | Freq: Every day | ORAL | 1 refills | Status: DC

## 2018-06-05 NOTE — Telephone Encounter (Signed)
Please let the patient know that I heard back from our clinical pharmacist.  They suggested using metformin extended release to see if she could tolerate that better.  I will send this to her pharmacy.  If she has any side effects from this she should let us know.

## 2018-06-05 NOTE — Telephone Encounter (Signed)
Called and spoke with patient's daughter. Daughter advised and voiced understanding.  

## 2018-06-06 ENCOUNTER — Telehealth: Payer: Self-pay | Admitting: Family Medicine

## 2018-06-06 MED ORDER — CEPHALEXIN 500 MG PO CAPS: 500.0000 mg | ORAL_CAPSULE | Freq: Two times a day (BID) | ORAL | 0 refills | Status: DC

## 2018-06-06 NOTE — Telephone Encounter (Signed)
I spoke with the patient's daughter per the patient's DPR.  Advised that her urine appears to be infected though it had not returned with sensitivities yet.  Given that it has not returned with this yet I advised I would send in an antibiotic and we would contact her to change it if it is the incorrect antibiotic when sensitivities returned.  Keflex sent to pharmacy.

## 2018-06-07 LAB — URINE CULTURE
MICRO NUMBER:: 52663
SPECIMEN QUALITY:: ADEQUATE

## 2018-06-07 MED ORDER — NITROFURANTOIN MONOHYD MACRO 100 MG PO CAPS: 100.0000 mg | ORAL_CAPSULE | Freq: Two times a day (BID) | ORAL | 0 refills | Status: DC

## 2018-06-07 NOTE — Telephone Encounter (Signed)
Culture sensitivities returned resistant to cefazolin. I contacted the pharmacy and the keflex had not been picked up. I advised that I wanted the keflex canceled and that I would send in a new antibiotic. Macrobid sent to pharmacy.

## 2018-06-07 NOTE — Addendum Note (Signed)
Addended by: Glori Luis on: 06/07/2018 09:48 AM   Modules accepted: Orders

## 2018-06-12 ENCOUNTER — Ambulatory Visit: Payer: Medicare HMO

## 2018-06-18 ENCOUNTER — Ambulatory Visit: Payer: Medicare HMO | Admitting: Family Medicine

## 2018-06-19 ENCOUNTER — Ambulatory Visit (INDEPENDENT_AMBULATORY_CARE_PROVIDER_SITE_OTHER): Payer: Medicare HMO | Admitting: *Deleted

## 2018-06-19 VITALS — BP 160/70 | HR 60 | Resp 16

## 2018-06-19 DIAGNOSIS — I10 Essential (primary) hypertension: Secondary | ICD-10-CM | POA: Diagnosis not present

## 2018-06-19 NOTE — Progress Notes (Signed)
SBP elevated similarly to reading from 05/15/18. Will defer to PCP for any medication changes.

## 2018-06-19 NOTE — Progress Notes (Signed)
Patient here for nurse visit BP check per order from 06/03/18.   Patient reports compliance with prescribed BP medications: yes  Last dose of BP medication: 7 AM  BP Readings from Last 3 Encounters:  06/19/18 (!) 166/74  06/03/18 120/62  05/15/18 (!) 154/67   Pulse Readings from Last 3 Encounters:  06/19/18 64  06/03/18 66  05/15/18 68    Patient brought in wrist cuff which was accurate with nurse.  Patient verbalized understanding of instructions.   Henrene Pastor, LPN

## 2018-06-22 NOTE — Progress Notes (Signed)
Patient's BP is elevated.  Please find out if she has been checking it at home and what it has been ranging.  Previously was ranging from the normal range to quite elevated.  If it has continued to be in the elevated range we will need to consider adding medication and I will need to discuss with our clinical pharmacist the best next step given prior medications tried and her recent lab work.

## 2018-06-24 NOTE — Progress Notes (Signed)
Patient report reading still up and down even at homepatient message through my chart about seeing Pharm_D she would like top talk with her daughter.

## 2018-06-28 NOTE — Progress Notes (Signed)
06/30/2018  3:53 PM   Crystal Miller Jan 04, 1946 [id number]  Referring provider: [name], MD [address] [place], [place] [address]  Chief Complaint  Patient presents with  . Recurrent UTI    HPI: Crystal Miller is a 73 y.o. female seen at the request of Dr. [name] for evaluation of lower urinary tract symptoms and recurrent UTI.  She presents with a several month history of urinary frequency, urgency and urge incontinence.  She has also been treated for recurrent UTIs.  She had positive urine cultures in June 2019 and January 2020 for E. coli and Citrobacter respectively.  She denies suprapubic pain, dysuria and states her only symptom of UTI is increased odor.  After treatment of her UTIs her storage related voiding symptoms do not change.  She denies any previous treatment for her voiding symptoms.  A renal ultrasound performed in October 2019 showed slight fullness of the left renal pelvis and a small left renal cyst.  Her PVR today is 51 mL.  PMH: Past Medical History:  Diagnosis Date  . Depression    husband died in 08/18/15  . DM (diabetes mellitus), type 2 with complications ([facility]) 0/17/4944  . Essential hypertension 11/01/2016  . GERD (gastroesophageal reflux disease)   . Mixed hyperlipidemia 11/01/2016  . Peripheral vascular disease ([facility])   . Renal insufficiency   . Stroke ([facility]) August 17, 2016   slight residual with speech. does not drive Lt side weakness    Surgical History: Past Surgical History:  Procedure Laterality Date  . CHOLECYSTECTOMY    . LOWER EXTREMITY ANGIOGRAPHY Left 12/31/2017   Procedure: LOWER EXTREMITY ANGIOGRAPHY;  Surgeon: Katha Cabal, MD;  Location: [facility];  Service: Cardiovascular;  Laterality: Left;  . LOWER EXTREMITY ANGIOGRAPHY Right 04/22/2018   Procedure: LOWER EXTREMITY ANGIOGRAPHY;  Surgeon: Katha Cabal, MD;  Location: [facility];  Service: Cardiovascular;  Laterality: Right;    Home  Medications:  Allergies as of 06/30/2018      Reactions   Metformin And Related Diarrhea      Medication List       Accurate as of June 30, 2018  3:53 PM. Always use your most recent med list.        acetaminophen 500 MG tablet Commonly known as:  TYLENOL Take 1,000 mg by mouth every 6 (six) hours as needed (for pain.).   alendronate 70 MG tablet Commonly known as:  FOSAMAX Take 1 tablet (70 mg total) by mouth every 7 (seven) days. Take with a full glass of water on an empty stomach.   amLODipine 10 MG tablet Commonly known as:  NORVASC Take 1 tablet (10 mg total) by mouth daily.   aspirin 81 MG EC tablet Take 1 tablet (81 mg total) by mouth daily.   atorvastatin 40 MG tablet Commonly known as:  LIPITOR TAKE 1 TABLET BY MOUTH ONCE DAILY AT 6PM   blood glucose meter kit and supplies Kit Dispense based on patient and insurance preference. Use up to four times daily as directed. (FOR ICD-9 250.00, 250.01).   carvedilol 3.125 MG tablet Commonly known as:  COREG Take 1 tablet (3.125 mg total) by mouth 2 (two) times daily with a meal.   clopidogrel 75 MG tablet Commonly known as:  PLAVIX Take 1 tablet (75 mg total) by mouth daily.   hydrochlorothiazide 12.5 MG tablet Commonly known as:  HYDRODIURIL Take 12.5 mg by mouth daily.   LUBRICANT EYE DROPS 0.4-0.3 % Soln  Generic drug:  Polyethyl Glycol-Propyl Glycol Place 1 drop into both eyes 3 (three) times daily as needed (for dry eyes.).   metFORMIN 500 MG 24 hr tablet Commonly known as:  GLUCOPHAGE XR Take 1 tablet (500 mg total) by mouth daily with breakfast.   multivitamin with minerals Tabs tablet Take 1 tablet by mouth daily. Centrum Silver . In the morning   vitamin B-12 1000 MCG tablet Commonly known as:  CYANOCOBALAMIN Take 1,000 mcg by mouth daily.       Allergies:  Allergies  Allergen Reactions  . Metformin And Related Diarrhea    Family History: Family History  Problem Relation Age of Onset   . Alzheimer's disease Mother   . Arthritis Mother   . Hypertension Mother   . CVA Father   . CAD Father   . Heart disease Father   . Stroke Father   . Hypertension Father   . Stroke Brother   . Breast cancer Neg Hx     Social History:  reports that she quit smoking about 8 years ago. Her smoking use included cigarettes. She has a 20.00 pack-year smoking history. She has never used smokeless tobacco. She reports that she does not drink alcohol or use drugs.  ROS: UROLOGY Frequent Urination?: Yes Hard to postpone urination?: No Burning/pain with urination?: No Get up at night to urinate?: Yes Leakage of urine?: No Urine stream starts and stops?: No Trouble starting stream?: No Do you have to strain to urinate?: No Blood in urine?: No Urinary tract infection?: Yes Sexually transmitted disease?: No Injury to kidneys or bladder?: No Painful intercourse?: No Weak stream?: No Currently pregnant?: No Vaginal bleeding?: No Last menstrual period?: Postmenopausal  Gastrointestinal Nausea?: No Vomiting?: No Indigestion/heartburn?: Yes Diarrhea?: No Constipation?: No  Constitutional Fever: No Night sweats?: No Weight loss?: No Fatigue?: Yes  Skin Skin rash/lesions?: No Itching?: No  Eyes Blurred vision?: Yes Double vision?: No  Ears/Nose/Throat Sore throat?: No Sinus problems?: No  Hematologic/Lymphatic Swollen glands?: No Easy bruising?: Yes  Cardiovascular Leg swelling?: No Chest pain?: No  Respiratory Cough?: No Shortness of breath?: No  Endocrine Excessive thirst?: Yes  Musculoskeletal Back pain?: No Joint pain?: No  Neurological Headaches?: No Dizziness?: No  Psychologic Depression?: Yes Anxiety?: No  Physical Exam: BP (!) 156/78 (BP Location: Left Arm, Patient Position: Sitting, Cuff Size: Normal)   Pulse 78   Ht '5\' 3"'  (1.6 m)   Wt 150 lb 1.6 oz (68.1 kg)   BMI 26.59 kg/m   Constitutional: Well nourished. Alert and oriented, No  acute distress. Cardiovascular: No clubbing, cyanosis, or edema. Respiratory: Normal respiratory effort, no increased work of breathing. Skin: No rashes, bruises or suspicious lesions. Neurologic: Grossly intact, no focal deficits, moving all 4 extremities. Psychiatric: Normal mood and affect.   Laboratory Data:  Urinalysis Clarity cloudy, 11-30 WBC, many bacteria, KET trace, PRO 2+, NIT positive, LEU 2+.  See Epic.  Relevant Imaging  CLINICAL DATA:  Progressive renal insufficiency  EXAM: RENAL / URINARY TRACT ULTRASOUND COMPLETE  COMPARISON:  None.  FINDINGS: Right Kidney:  Length: 9.8 cm. Echogenicity and renal cortical thickness are within normal limits. No mass, perinephric fluid, or hydronephrosis visualized. There is an extrarenal pelvis on the right. No sonographically demonstrable calculus or ureterectasis.  Left Kidney:  Length: 9.4 cm. Echogenicity and renal cortical thickness are within normal limits. No perinephric fluid visualized. There is a cyst in the lower pole left kidney measuring 1.2 x 0.8 x 0.9 cm. There is slight  fullness of the left renal pelvis without caliectasis. No sonographically demonstrable calculus or ureterectasis.  Bladder:  Appears normal for degree of bladder distention. Flow from each ureter is seen in the urinary bladder by color Doppler examination.  IMPRESSION: 1. Slight fullness of the left renal pelvis without caliectasis. Significance of this finding uncertain.  2.  Small cyst lower pole left kidney.  3.  Study otherwise unremarkable.   Electronically Signed   By: Lowella Grip III M.D.   On: 03/15/2018 00:57  Results for KIRRA, VERGA (MRN [id number]) as of 06/30/2018 15:20  Ref. Range 06/30/2018 15:11  Scan Result Unknown 69m   Assessment & Plan:   73year old female with overactive bladder symptoms and urge incontinence.  Urinalysis today does show pyuria and a urine was cultured.  I suspect she  has asymptomatic bacteriuria as her voiding symptoms do not change with treatment and she has no dysuria.  Her PVR was 51 mL.  Will give a trial of solifenacin 5 mg daily.  Follow-up 4 to 6 weeks for symptom reassessment and repeat urinalysis.  Will schedule cystoscopy for persistent symptoms.  SAbbie Sons [address] [place] [phone] I, KAdele Schilder am acting as a scribe for SJohn Giovanni MD.    I, SAbbie Sons MD, have reviewed all documentation for this visit. The documentation on 06/30/18 for the exam, diagnosis, procedures, and orders are all accurate and complete.

## 2018-06-30 ENCOUNTER — Ambulatory Visit (INDEPENDENT_AMBULATORY_CARE_PROVIDER_SITE_OTHER): Payer: Medicare HMO | Admitting: Urology

## 2018-06-30 ENCOUNTER — Encounter: Payer: Self-pay | Admitting: Urology

## 2018-06-30 VITALS — BP 156/78 | HR 78 | Ht 63.0 in | Wt 150.1 lb

## 2018-06-30 DIAGNOSIS — N3281 Overactive bladder: Secondary | ICD-10-CM | POA: Diagnosis not present

## 2018-06-30 DIAGNOSIS — R350 Frequency of micturition: Secondary | ICD-10-CM | POA: Diagnosis not present

## 2018-06-30 DIAGNOSIS — N390 Urinary tract infection, site not specified: Secondary | ICD-10-CM

## 2018-06-30 DIAGNOSIS — N3941 Urge incontinence: Secondary | ICD-10-CM | POA: Diagnosis not present

## 2018-06-30 LAB — BLADDER SCAN AMB NON-IMAGING

## 2018-06-30 MED ORDER — SOLIFENACIN SUCCINATE 5 MG PO TABS: 5.0000 mg | ORAL_TABLET | Freq: Every day | ORAL | 1 refills | Status: DC

## 2018-06-30 NOTE — Patient Instructions (Addendum)
Urinary Tract Infection, Adult  A urinary tract infection (UTI) is an infection of any part of the urinary tract. The urinary tract includes the kidneys, ureters, bladder, and urethra. These organs make, store, and get rid of urine in the body. Your health care provider may use other names to describe the infection. An upper UTI affects the ureters and kidneys (pyelonephritis). A lower UTI affects the bladder (cystitis) and urethra (urethritis). What are the causes? Most urinary tract infections are caused by bacteria in your genital area, around the entrance to your urinary tract (urethra). These bacteria grow and cause inflammation of your urinary tract. What increases the risk? You are more likely to develop this condition if:  You have a urinary catheter that stays in place (indwelling).  You are not able to control when you urinate or have a bowel movement (you have incontinence).  You are female and you: ? Use a spermicide or diaphragm for birth control. ? Have low estrogen levels. ? Are pregnant.  You have certain genes that increase your risk (genetics).  You are sexually active.  You take antibiotic medicines.  You have a condition that causes your flow of urine to slow down, such as: ? An enlarged prostate, if you are female. ? Blockage in your urethra (stricture). ? A kidney stone. ? A nerve condition that affects your bladder control (neurogenic bladder). ? Not getting enough to drink, or not urinating often.  You have certain medical conditions, such as: ? Diabetes. ? A weak disease-fighting system (immunesystem). ? Sickle cell disease. ? Gout. ? Spinal cord injury. What are the signs or symptoms? Symptoms of this condition include:  Needing to urinate right away (urgently).  Frequent urination or passing small amounts of urine frequently.  Pain or burning with urination.  Blood in the urine.  Urine that smells bad or unusual.  Trouble urinating.  Cloudy  urine.  Vaginal discharge, if you are female.  Pain in the abdomen or the lower back. You may also have:  Vomiting or a decreased appetite.  Confusion.  Irritability or tiredness.  A fever.  Diarrhea. The first symptom in older adults may be confusion. In some cases, they may not have any symptoms until the infection has worsened. How is this diagnosed? This condition is diagnosed based on your medical history and a physical exam. You may also have other tests, including:  Urine tests.  Blood tests.  Tests for sexually transmitted infections (STIs). If you have had more than one UTI, a cystoscopy or imaging studies may be done to determine the cause of the infections. How is this treated? Treatment for this condition includes:  Antibiotic medicine.  Over-the-counter medicines to treat discomfort.  Drinking enough water to stay hydrated. If you have frequent infections or have other conditions such as a kidney stone, you may need to see a health care provider who specializes in the urinary tract (urologist). In rare cases, urinary tract infections can cause sepsis. Sepsis is a life-threatening condition that occurs when the body responds to an infection. Sepsis is treated in the hospital with IV antibiotics, fluids, and other medicines. Follow these instructions at home:  Medicines  Take over-the-counter and prescription medicines only as told by your health care provider.  If you were prescribed an antibiotic medicine, take it as told by your health care provider. Do not stop using the antibiotic even if you start to feel better. General instructions  Make sure you: ? Empty your bladder often and   completely. Do not hold urine for long periods of time. ? Empty your bladder after sex. ? Wipe from front to back after a bowel movement if you are female. Use each tissue one time when you wipe.  Drink enough fluid to keep your urine pale yellow.  Keep all follow-up  visits as told by your health care provider. This is important. Contact a health care provider if:  Your symptoms do not get better after 1-2 days.  Your symptoms go away and then return. Get help right away if you have:  Severe pain in your back or your lower abdomen.  A fever.  Nausea or vomiting. Summary  A urinary tract infection (UTI) is an infection of any part of the urinary tract, which includes the kidneys, ureters, bladder, and urethra.  Most urinary tract infections are caused by bacteria in your genital area, around the entrance to your urinary tract (urethra).  Treatment for this condition often includes antibiotic medicines.  If you were prescribed an antibiotic medicine, take it as told by your health care provider. Do not stop using the antibiotic even if you start to feel better.  Keep all follow-up visits as told by your health care provider. This is important. This information is not intended to replace advice given to you by your health care provider. Make sure you discuss any questions you have with your health care provider. Document Released: 02/14/2005 Document Revised: 11/14/2017 Document Reviewed: 11/14/2017 Elsevier Interactive Patient Education  2019 Elsevier Inc. Overactive Bladder, Adult  Overactive bladder refers to a condition in which a person has a sudden need to pass urine. The person may leak urine if he or she cannot get to the bathroom fast enough (urinary incontinence). A person with this condition may also wake up several times in the night to go to the bathroom. Overactive bladder is associated with poor nerve signals between your bladder and your brain. Your bladder may get the signal to empty before it is full. You may also have very sensitive muscles that make your bladder squeeze too soon. These symptoms might interfere with daily work or social activities. What are the causes? This condition may be associated with or caused by:  Urinary  tract infection.  Infection of nearby tissues, such as the prostate.  Prostate enlargement.  Surgery on the uterus or urethra.  Bladder stones, inflammation, or tumors.  Drinking too much caffeine or alcohol.  Certain medicines, especially medicines that get rid of extra fluid in the body (diuretics).  Muscle or nerve weakness, especially from: ? A spinal cord injury. ? Stroke. ? Multiple sclerosis. ? Parkinson's disease.  Diabetes.  Constipation. What increases the risk? You may be at greater risk for overactive bladder if you:  Are an older adult.  Smoke.  Are going through menopause.  Have prostate problems.  Have a neurological disease, such as stroke, dementia, Parkinson's disease, or multiple sclerosis (MS).  Eat or drink things that irritate the bladder. These include alcohol, spicy food, and caffeine.  Are overweight or obese. What are the signs or symptoms? Symptoms of this condition include:  Sudden, strong urge to urinate.  Leaking urine.  Urinating 8 or more times a day.  Waking up to urinate 2 or more times a night. How is this diagnosed? Your health care provider may suspect overactive bladder based on your symptoms. He or she will diagnose this condition by:  A physical exam and medical history.  Blood or urine tests. You might need   bladder or urine tests to help determine what is causing your overactive bladder. You might also need to see a health care provider who specializes in urinary tract problems (urologist). How is this treated? Treatment for overactive bladder depends on the cause of your condition and whether it is mild or severe. You can also make lifestyle changes at home. Options include:  Bladder training. This may include: ? Learning to control the urge to urinate by following a schedule that directs you to urinate at regular intervals (timed voiding). ? Doing Kegel exercises to strengthen your pelvic floor muscles, which  support your bladder. Toning these muscles can help you control urination, even if your bladder muscles are overactive.  Special devices. This may include: ? Biofeedback, which uses sensors to help you become aware of your body's signals. ? Electrical stimulation, which uses electrodes placed inside the body (implanted) or outside the body. These electrodes send gentle pulses of electricity to strengthen the nerves or muscles that control the bladder. ? Women may use a plastic device that fits into the vagina and supports the bladder (pessary).  Medicines. ? Antibiotics to treat bladder infection. ? Antispasmodics to stop the bladder from releasing urine at the wrong time. ? Tricyclic antidepressants to relax bladder muscles. ? Injections of botulinum toxin type A directly into the bladder tissue to relax bladder muscles.  Lifestyle changes. This may include: ? Weight loss. Talk to your health care provider about weight loss methods that would work best for you. ? Diet changes. This may include reducing how much alcohol and caffeine you consume, or drinking fluids at different times of the day. ? Not smoking. Do not use any products that contain nicotine or tobacco, such as cigarettes and e-cigarettes. If you need help quitting, ask your health care provider.  Surgery. ? A device may be implanted to help manage the nerve signals that control urination. ? An electrode may be implanted to stimulate electrical signals in the bladder. ? A procedure may be done to change the shape of the bladder. This is done only in very severe cases. Follow these instructions at home: Lifestyle  Make any diet or lifestyle changes that are recommended by your health care provider. These may include: ? Drinking less fluid or drinking fluids at different times of the day. ? Cutting down on caffeine or alcohol. ? Doing Kegel exercises. ? Losing weight if needed. ? Eating a healthy and balanced diet to prevent  constipation. This may include:  Eating foods that are high in fiber, such as fresh fruits and vegetables, whole grains, and beans.  Limiting foods that are high in fat and processed sugars, such as fried and sweet foods. General instructions  Take over-the-counter and prescription medicines only as told by your health care provider.  If you were prescribed an antibiotic medicine, take it as told by your health care provider. Do not stop taking the antibiotic even if you start to feel better.  Use any implants or pessary as told by your health care provider.  If needed, wear pads to absorb urine leakage.  Keep a journal or log to track how much and when you drink and when you feel the need to urinate. This will help your health care provider monitor your condition.  Keep all follow-up visits as told by your health care provider. This is important. Contact a health care provider if:  You have a fever.  Your symptoms do not get better with treatment.  Your pain   and discomfort get worse.  You have more frequent urges to urinate. Get help right away if:  You are not able to control your bladder. Summary  Overactive bladder refers to a condition in which a person has a sudden need to pass urine.  Several conditions may lead to an overactive bladder.  Treatment for overactive bladder depends on the cause and severity of your condition.  Follow your health care provider's instructions about lifestyle changes, doing Kegel exercises, keeping a journal, and taking medicines. This information is not intended to replace advice given to you by your health care provider. Make sure you discuss any questions you have with your health care provider. Document Released: 03/03/2009 Document Revised: 05/23/2017 Document Reviewed: 05/23/2017 Elsevier Interactive Patient Education  2019 Elsevier Inc.  

## 2018-07-01 LAB — URINALYSIS, COMPLETE
Bilirubin, UA: NEGATIVE
Glucose, UA: NEGATIVE
Nitrite, UA: POSITIVE — AB
PH UA: 5.5 (ref 5.0–7.5)
RBC, UA: NEGATIVE
Specific Gravity, UA: 1.02 (ref 1.005–1.030)
Urobilinogen, Ur: 0.2 mg/dL (ref 0.2–1.0)

## 2018-07-01 LAB — MICROSCOPIC EXAMINATION: RBC, UA: NONE SEEN /hpf (ref 0–2)

## 2018-07-02 LAB — CULTURE, URINE COMPREHENSIVE

## 2018-07-03 NOTE — Progress Notes (Signed)
Please follow-up with the patient to see if she would be willing to see [name] for BP management. Thanks.

## 2018-07-08 NOTE — Progress Notes (Signed)
Patient agreeable to see Pharm-D, called daughter schedule patient with [name] left message to call office.

## 2018-07-09 ENCOUNTER — Other Ambulatory Visit: Payer: Self-pay | Admitting: Family Medicine

## 2018-07-10 ENCOUNTER — Other Ambulatory Visit: Payer: Self-pay | Admitting: Family Medicine

## 2018-07-10 ENCOUNTER — Telehealth: Payer: Self-pay | Admitting: Urology

## 2018-07-10 MED ORDER — ALENDRONATE SODIUM 70 MG PO TABS: 70.0000 mg | ORAL_TABLET | ORAL | 5 refills | Status: DC

## 2018-07-10 NOTE — Telephone Encounter (Signed)
Copied from CRM [id number]. Topic: Quick Communication - Rx Refill/Question >> Jul 10, 2018 10:16 AM Crystal Miller E wrote: Medication: alendronate (FOSAMAX) 70 MG tablet  Has the patient contacted their pharmacy?no  Preferred Pharmacy (with phone number or street name): [facility] [address] [place] - [address] [phone] (Phone) [phone] (Fax)    Agent: Please be advised that RX refills may take up to 3 business days. We ask that you follow-up with your pharmacy.

## 2018-07-10 NOTE — Telephone Encounter (Signed)
Pt called stating the Rx that was sent in from her last appt was too expensive, pt did not know medication name/doseage. pt wants to know if something cheaper can be called in at [facility] on [address]. Please advise.

## 2018-07-11 NOTE — Telephone Encounter (Signed)
Patient notified and voiced understanding.

## 2018-07-11 NOTE — Telephone Encounter (Signed)
Rx generic Vesicare was sent to the pharmacy.  A list expensive medication will depend on her Medicare part D formulary.  She will either need to check with her insurance company or can check her pharmacy for the preferred [facility] medication on her plan.

## 2018-07-21 NOTE — Progress Notes (Signed)
scheduled with Pharm D

## 2018-07-30 ENCOUNTER — Ambulatory Visit: Payer: Medicare HMO | Admitting: Urology

## 2018-08-01 ENCOUNTER — Emergency Department: Payer: Medicare HMO

## 2018-08-01 ENCOUNTER — Other Ambulatory Visit: Payer: Self-pay

## 2018-08-01 ENCOUNTER — Inpatient Hospital Stay
Admission: EM | Admit: 2018-08-01 | Discharge: 2018-08-04 | DRG: 872 | Disposition: A | Discharge status: Home Health | Payer: Medicare HMO | Attending: Internal Medicine | Admitting: Internal Medicine

## 2018-08-01 ENCOUNTER — Encounter: Payer: Self-pay | Admitting: Nurse Practitioner

## 2018-08-01 DIAGNOSIS — Z823 Family history of stroke: Secondary | ICD-10-CM | POA: Diagnosis not present

## 2018-08-01 DIAGNOSIS — I10 Essential (primary) hypertension: Secondary | ICD-10-CM | POA: Diagnosis not present

## 2018-08-01 DIAGNOSIS — R32 Unspecified urinary incontinence: Secondary | ICD-10-CM | POA: Diagnosis present

## 2018-08-01 DIAGNOSIS — A419 Sepsis, unspecified organism: Principal | ICD-10-CM | POA: Diagnosis present

## 2018-08-01 DIAGNOSIS — Z8249 Family history of ischemic heart disease and other diseases of the circulatory system: Secondary | ICD-10-CM | POA: Diagnosis not present

## 2018-08-01 DIAGNOSIS — N151 Renal and perinephric abscess: Secondary | ICD-10-CM | POA: Diagnosis not present

## 2018-08-01 DIAGNOSIS — Z7984 Long term (current) use of oral hypoglycemic drugs: Secondary | ICD-10-CM | POA: Diagnosis not present

## 2018-08-01 DIAGNOSIS — E782 Mixed hyperlipidemia: Secondary | ICD-10-CM | POA: Diagnosis not present

## 2018-08-01 DIAGNOSIS — R1084 Generalized abdominal pain: Secondary | ICD-10-CM | POA: Diagnosis not present

## 2018-08-01 DIAGNOSIS — R109 Unspecified abdominal pain: Secondary | ICD-10-CM | POA: Diagnosis not present

## 2018-08-01 DIAGNOSIS — B9620 Unspecified Escherichia coli [E. coli] as the cause of diseases classified elsewhere: Secondary | ICD-10-CM | POA: Diagnosis present

## 2018-08-01 DIAGNOSIS — D72829 Elevated white blood cell count, unspecified: Secondary | ICD-10-CM | POA: Diagnosis not present

## 2018-08-01 DIAGNOSIS — Z87440 Personal history of urinary (tract) infections: Secondary | ICD-10-CM

## 2018-08-01 DIAGNOSIS — Z888 Allergy status to other drugs, medicaments and biological substances status: Secondary | ICD-10-CM

## 2018-08-01 DIAGNOSIS — Z8673 Personal history of transient ischemic attack (TIA), and cerebral infarction without residual deficits: Secondary | ICD-10-CM

## 2018-08-01 DIAGNOSIS — Z87891 Personal history of nicotine dependence: Secondary | ICD-10-CM

## 2018-08-01 DIAGNOSIS — Z7982 Long term (current) use of aspirin: Secondary | ICD-10-CM | POA: Diagnosis not present

## 2018-08-01 DIAGNOSIS — K219 Gastro-esophageal reflux disease without esophagitis: Secondary | ICD-10-CM | POA: Diagnosis not present

## 2018-08-01 DIAGNOSIS — N390 Urinary tract infection, site not specified: Secondary | ICD-10-CM | POA: Diagnosis not present

## 2018-08-01 DIAGNOSIS — N10 Acute tubulo-interstitial nephritis: Secondary | ICD-10-CM | POA: Diagnosis not present

## 2018-08-01 DIAGNOSIS — Z7902 Long term (current) use of antithrombotics/antiplatelets: Secondary | ICD-10-CM

## 2018-08-01 DIAGNOSIS — Z79899 Other long term (current) drug therapy: Secondary | ICD-10-CM

## 2018-08-01 DIAGNOSIS — E1151 Type 2 diabetes mellitus with diabetic peripheral angiopathy without gangrene: Secondary | ICD-10-CM | POA: Diagnosis not present

## 2018-08-01 DIAGNOSIS — Z66 Do not resuscitate: Secondary | ICD-10-CM | POA: Diagnosis not present

## 2018-08-01 DIAGNOSIS — N12 Tubulo-interstitial nephritis, not specified as acute or chronic: Secondary | ICD-10-CM | POA: Diagnosis not present

## 2018-08-01 DIAGNOSIS — R079 Chest pain, unspecified: Secondary | ICD-10-CM | POA: Diagnosis not present

## 2018-08-01 DIAGNOSIS — E1165 Type 2 diabetes mellitus with hyperglycemia: Secondary | ICD-10-CM | POA: Diagnosis not present

## 2018-08-01 DIAGNOSIS — E785 Hyperlipidemia, unspecified: Secondary | ICD-10-CM | POA: Diagnosis not present

## 2018-08-01 HISTORY — DX: Acute pyelonephritis: N10

## 2018-08-01 LAB — CBC WITH DIFFERENTIAL/PLATELET
Abs Immature Granulocytes: 0.15 10*3/uL — ABNORMAL HIGH (ref 0.00–0.07)
Basophils Absolute: 0.1 10*3/uL (ref 0.0–0.1)
Basophils Relative: 0 %
Eosinophils Absolute: 0 10*3/uL (ref 0.0–0.5)
Eosinophils Relative: 0 %
HCT: 36 % (ref 36.0–46.0)
Hemoglobin: 12.5 g/dL (ref 12.0–15.0)
Immature Granulocytes: 1 %
Lymphocytes Relative: 3 %
Lymphs Abs: 0.8 10*3/uL (ref 0.7–4.0)
MCH: 30.3 pg (ref 26.0–34.0)
MCHC: 34.7 g/dL (ref 30.0–36.0)
MCV: 87.2 fL (ref 80.0–100.0)
Monocytes Absolute: 1.5 10*3/uL — ABNORMAL HIGH (ref 0.1–1.0)
Monocytes Relative: 6 %
Neutro Abs: 24.3 10*3/uL — ABNORMAL HIGH (ref 1.7–7.7)
Neutrophils Relative %: 90 %
Platelets: 323 10*3/uL (ref 150–400)
RBC: 4.13 MIL/uL (ref 3.87–5.11)
RDW: 12.5 % (ref 11.5–15.5)
Smear Review: NORMAL
WBC: 26.8 10*3/uL — AB (ref 4.0–10.5)
nRBC: 0 % (ref 0.0–0.2)

## 2018-08-01 LAB — COMPREHENSIVE METABOLIC PANEL
ALT: 27 U/L (ref 0–44)
AST: 27 U/L (ref 15–41)
Albumin: 4.3 g/dL (ref 3.5–5.0)
Alkaline Phosphatase: 62 U/L (ref 38–126)
Anion gap: 12 (ref 5–15)
BUN: 30 mg/dL — ABNORMAL HIGH (ref 8–23)
CALCIUM: 9.2 mg/dL (ref 8.9–10.3)
CO2: 21 mmol/L — ABNORMAL LOW (ref 22–32)
Chloride: 101 mmol/L (ref 98–111)
Creatinine, Ser: 1.06 mg/dL — ABNORMAL HIGH (ref 0.44–1.00)
GFR calc non Af Amer: 52 mL/min — ABNORMAL LOW (ref >60)
GFR, EST AFRICAN AMERICAN: 60 mL/min — AB (ref >60)
Glucose, Bld: 304 mg/dL — ABNORMAL HIGH (ref 70–99)
Potassium: 5.1 mmol/L (ref 3.5–5.1)
Sodium: 134 mmol/L — ABNORMAL LOW (ref 135–145)
Total Bilirubin: 0.7 mg/dL (ref 0.3–1.2)
Total Protein: 8.5 g/dL — ABNORMAL HIGH (ref 6.5–8.1)

## 2018-08-01 LAB — URINALYSIS, COMPLETE (UACMP) WITH MICROSCOPIC
BILIRUBIN URINE: NEGATIVE
HGB URINE DIPSTICK: NEGATIVE
Ketones, ur: NEGATIVE mg/dL
Nitrite: NEGATIVE
Protein, ur: 100 mg/dL — AB
Specific Gravity, Urine: 1.013 (ref 1.005–1.030)
Squamous Epithelial / HPF: NONE SEEN (ref 0–5)
WBC, UA: >50 WBC/hpf — ABNORMAL HIGH (ref 0–5)
pH: 6 (ref 5.0–8.0)

## 2018-08-01 LAB — GLUCOSE, CAPILLARY
Glucose-Capillary: 250 mg/dL — ABNORMAL HIGH (ref 70–99)
Glucose-Capillary: 220 mg/dL — ABNORMAL HIGH (ref 70–99)

## 2018-08-01 LAB — MRSA PCR SCREENING: MRSA by PCR: POSITIVE — AB

## 2018-08-01 LAB — LACTIC ACID, PLASMA
Lactic Acid, Venous: 2.2 mmol/L (ref 0.5–1.9)
Lactic Acid, Venous: 2.3 mmol/L (ref 0.5–1.9)

## 2018-08-01 LAB — TROPONIN I: Troponin I: <0.03 ng/mL (ref <0.03)

## 2018-08-01 LAB — LIPASE, BLOOD: Lipase: 44 U/L (ref 11–51)

## 2018-08-01 MED ORDER — SODIUM CHLORIDE 0.9 % IV SOLN
INTRAVENOUS | Status: DC
  Administered 2018-08-01 – 2018-08-03 (×4): via INTRAVENOUS

## 2018-08-01 MED ORDER — SODIUM CHLORIDE 0.9 % IV SOLN
1.0000 g | INTRAVENOUS | Status: AC
  Administered 2018-08-02 – 2018-08-04 (×3): 1 g via INTRAVENOUS
  Filled 2018-08-01 (×3): qty 1

## 2018-08-01 MED ORDER — INSULIN ASPART 100 UNIT/ML ~~LOC~~ SOLN
0.0000 [IU] | Freq: Three times a day (TID) | SUBCUTANEOUS | Status: DC
  Administered 2018-08-01: 3 [IU] via SUBCUTANEOUS
  Administered 2018-08-02 – 2018-08-03 (×4): 2 [IU] via SUBCUTANEOUS
  Administered 2018-08-03: 3 [IU] via SUBCUTANEOUS
  Administered 2018-08-03 – 2018-08-04 (×2): 2 [IU] via SUBCUTANEOUS
  Administered 2018-08-04: 3 [IU] via SUBCUTANEOUS
  Filled 2018-08-01 (×9): qty 1

## 2018-08-01 MED ORDER — ADULT MULTIVITAMIN W/MINERALS CH
1.0000 | ORAL_TABLET | Freq: Every day | ORAL | Status: DC
  Administered 2018-08-01 – 2018-08-04 (×4): 1 via ORAL
  Filled 2018-08-01 (×4): qty 1

## 2018-08-01 MED ORDER — ONDANSETRON HCL 4 MG/2ML IJ SOLN: 4.0000 mg | Freq: Four times a day (QID) | INTRAMUSCULAR | Status: DC | PRN

## 2018-08-01 MED ORDER — DARIFENACIN HYDROBROMIDE ER 7.5 MG PO TB24
7.5000 mg | ORAL_TABLET | Freq: Every day | ORAL | Status: DC
  Administered 2018-08-01 – 2018-08-04 (×4): 7.5 mg via ORAL
  Filled 2018-08-01 (×5): qty 1

## 2018-08-01 MED ORDER — SODIUM CHLORIDE 0.9 % IV SOLN
1.0000 g | Freq: Once | INTRAVENOUS | Status: AC
  Administered 2018-08-01: 1 g via INTRAVENOUS
  Filled 2018-08-01: qty 10

## 2018-08-01 MED ORDER — ASPIRIN EC 81 MG PO TBEC
81.0000 mg | DELAYED_RELEASE_TABLET | Freq: Every day | ORAL | Status: DC
  Administered 2018-08-01 – 2018-08-04 (×4): 81 mg via ORAL
  Filled 2018-08-01 (×4): qty 1

## 2018-08-01 MED ORDER — CLOPIDOGREL BISULFATE 75 MG PO TABS
75.0000 mg | ORAL_TABLET | Freq: Every day | ORAL | Status: DC
  Administered 2018-08-01 – 2018-08-04 (×4): 75 mg via ORAL
  Filled 2018-08-01 (×4): qty 1

## 2018-08-01 MED ORDER — ACETAMINOPHEN 650 MG RE SUPP: 650.0000 mg | Freq: Four times a day (QID) | RECTAL | Status: DC | PRN

## 2018-08-01 MED ORDER — ATORVASTATIN CALCIUM 20 MG PO TABS
40.0000 mg | ORAL_TABLET | Freq: Every day | ORAL | Status: DC
  Administered 2018-08-01 – 2018-08-03 (×3): 40 mg via ORAL
  Filled 2018-08-01 (×3): qty 2

## 2018-08-01 MED ORDER — SODIUM CHLORIDE 0.9 % IV BOLUS
500.0000 mL | Freq: Once | INTRAVENOUS | Status: AC
  Administered 2018-08-01: 500 mL via INTRAVENOUS

## 2018-08-01 MED ORDER — VITAMIN B-12 1000 MCG PO TABS
1000.0000 ug | ORAL_TABLET | Freq: Every day | ORAL | Status: DC
  Administered 2018-08-01 – 2018-08-04 (×4): 1000 ug via ORAL
  Filled 2018-08-01 (×4): qty 1

## 2018-08-01 MED ORDER — ONDANSETRON HCL 4 MG PO TABS: 4.0000 mg | ORAL_TABLET | Freq: Four times a day (QID) | ORAL | Status: DC | PRN

## 2018-08-01 MED ORDER — ENOXAPARIN SODIUM 40 MG/0.4ML ~~LOC~~ SOLN
40.0000 mg | SUBCUTANEOUS | Status: DC
  Administered 2018-08-01 – 2018-08-03 (×3): 40 mg via SUBCUTANEOUS
  Filled 2018-08-01 (×3): qty 0.4

## 2018-08-01 MED ORDER — INSULIN ASPART 100 UNIT/ML ~~LOC~~ SOLN
0.0000 [IU] | Freq: Every day | SUBCUTANEOUS | Status: DC
  Administered 2018-08-01: 2 [IU] via SUBCUTANEOUS
  Filled 2018-08-01: qty 1

## 2018-08-01 MED ORDER — AMLODIPINE BESYLATE 10 MG PO TABS
10.0000 mg | ORAL_TABLET | Freq: Every day | ORAL | Status: DC
  Administered 2018-08-01 – 2018-08-04 (×4): 10 mg via ORAL
  Filled 2018-08-01 (×4): qty 1

## 2018-08-01 MED ORDER — ACETAMINOPHEN 325 MG PO TABS
650.0000 mg | ORAL_TABLET | Freq: Four times a day (QID) | ORAL | Status: DC | PRN
  Administered 2018-08-01 – 2018-08-02 (×3): 650 mg via ORAL
  Filled 2018-08-01 (×3): qty 2

## 2018-08-01 MED ORDER — CARVEDILOL 3.125 MG PO TABS
3.1250 mg | ORAL_TABLET | Freq: Two times a day (BID) | ORAL | Status: DC
  Administered 2018-08-01 – 2018-08-04 (×6): 3.125 mg via ORAL
  Filled 2018-08-01 (×7): qty 1

## 2018-08-01 MED ORDER — FENTANYL CITRATE (PF) 100 MCG/2ML IJ SOLN
50.0000 ug | Freq: Once | INTRAMUSCULAR | Status: AC
  Administered 2018-08-01: 50 ug via INTRAVENOUS
  Filled 2018-08-01: qty 2

## 2018-08-01 NOTE — ED Provider Notes (Signed)
[facility] Emergency Department Provider Note  ____________________________________________   I have reviewed the triage vital signs and the nursing notes. Where available I have reviewed prior notes and, if possible and indicated, outside hospital notes.    HISTORY  Chief Complaint Abdominal Pain    HPI Crystal Miller is a 73 y.o. female history of depression diabetes mellitus, renal insufficiency, CVA in the past, "urine problems" states that she been having urinary frequency, but no dysuria, no vomiting no diarrhea but abdominal pain mostly on the right flank began gradually yesterday night and is progressively getting worse.  No fever.  Pain is continual at this time.  Is a sharp nonradiating discomfort.  Nothing makes it better nothing makes it worse no other alleviating or aggravating factors no other prior treatment.  Does not recall having had this before.    Past Medical History:  Diagnosis Date  . Depression    husband died in Jul 27, 2015  . DM (diabetes mellitus), type 2 with complications ([place]) 12/29/9145  . Essential hypertension 11/01/2016  . GERD (gastroesophageal reflux disease)   . Mixed hyperlipidemia 11/01/2016  . Peripheral vascular disease ([place])   . Renal insufficiency   . Stroke ([place]) 07-26-2016   slight residual with speech. does not drive Lt side weakness    Patient Active Problem List   Diagnosis Date Noted  . Overactive bladder 06/30/2018  . Acute right-sided low back pain without sciatica 02/19/2018  . Depression, major, single episode, complete remission ([place]) 02/19/2018  . Right hip pain 10/30/2017  . Urine frequency 10/30/2017  . PAD (peripheral artery disease) ([place]) 10/10/2017  . Decreased pedal pulses 07/30/2017  . Right pontine stroke ([place]) 03/27/2017  . DM (diabetes mellitus), type 2 with complications ([place]) 82/95/6213  . Essential hypertension 11/01/2016  . Mixed hyperlipidemia 11/01/2016  . Memory change 11/01/2016  . History  of stroke 10/01/2016    Past Surgical History:  Procedure Laterality Date  . CHOLECYSTECTOMY    . LOWER EXTREMITY ANGIOGRAPHY Left 12/31/2017   Procedure: LOWER EXTREMITY ANGIOGRAPHY;  Surgeon: Katha Cabal, MD;  Location: [facility];  Service: Cardiovascular;  Laterality: Left;  . LOWER EXTREMITY ANGIOGRAPHY Right 04/22/2018   Procedure: LOWER EXTREMITY ANGIOGRAPHY;  Surgeon: Katha Cabal, MD;  Location: [facility];  Service: Cardiovascular;  Laterality: Right;    Prior to Admission medications   Medication Sig Start Date End Date Taking? Authorizing Provider  alendronate (FOSAMAX) 70 MG tablet TAKE 1 TABLET BY MOUTH EVERY 7 DAYS. TAKE WITH A FULL GLASS OF WATER ON AN EMPTY STOMACH. 07/10/18  Yes [name], MD  amLODipine (NORVASC) 10 MG tablet Take 1 tablet (10 mg total) by mouth daily. 12/26/17  Yes [name], MD  aspirin EC 81 MG EC tablet Take 1 tablet (81 mg total) by mouth daily. Patient taking differently: Take 81 mg by mouth daily. In the morning 10/03/16  Yes Vaughan Basta, MD  atorvastatin (LIPITOR) 40 MG tablet TAKE 1 TABLET BY MOUTH ONCE DAILY AT 6PM 05/05/18  Yes [name], MD  carvedilol (COREG) 3.125 MG tablet Take 1 tablet (3.125 mg total) by mouth 2 (two) times daily with a meal. 04/08/18  Yes [name], MD  clopidogrel (PLAVIX) 75 MG tablet Take 1 tablet (75 mg total) by mouth daily. 12/31/17  Yes Schnier, Dolores Lory, MD  metFORMIN (GLUCOPHAGE XR) 500 MG 24 hr tablet Take 1 tablet (500 mg total) by mouth daily with breakfast. 06/05/18  Yes [name], MD  Multiple Vitamin (MULTIVITAMIN WITH MINERALS) TABS tablet Take 1 tablet by mouth daily. Centrum Silver . In the morning   Yes [provider]  solifenacin (VESICARE) 5 MG tablet Take 1 tablet (5 mg total) by mouth daily. 06/30/18  Yes Stoioff, Ronda Fairly, MD  vitamin B-12 (CYANOCOBALAMIN) 1000 MCG tablet Take 1,000 mcg by mouth daily.    Yes  [provider]  acetaminophen (TYLENOL) 500 MG tablet Take 1,000 mg by mouth every 6 (six) hours as needed (for pain.).    [provider]  blood glucose meter kit and supplies KIT Dispense based on patient and insurance preference. Use up to four times daily as directed. (FOR ICD-9 250.00, 250.01). 10/05/16   Schaevitz, Randall An, MD  hydrochlorothiazide (HYDRODIURIL) 12.5 MG tablet Take 12.5 mg by mouth daily. 06/07/18   [provider]  Polyethyl Glycol-Propyl Glycol (LUBRICANT EYE DROPS) 0.4-0.3 % SOLN Place 1 drop into both eyes 3 (three) times daily as needed (for dry eyes.).    [provider]    Allergies Metformin and related  Family History  Problem Relation Age of Onset  . Alzheimer's disease Mother   . Arthritis Mother   . Hypertension Mother   . CVA Father   . CAD Father   . Heart disease Father   . Stroke Father   . Hypertension Father   . Stroke Brother   . Breast cancer Neg Hx     Social History Social History   Tobacco Use  . Smoking status: Former Smoker    Packs/day: 1.00    Years: 20.00    Pack years: 20.00    Types: Cigarettes    Last attempt to quit: 2012    Years since quitting: 8.2  . Smokeless tobacco: Never Used  Substance Use Topics  . Alcohol use: No  . Drug use: No    Review of Systems *Constitutional: No fever/chills Eyes: No visual changes. ENT: No sore throat. No stiff neck no neck pain Cardiovascular: Denies chest pain. Respiratory: Denies shortness of breath. Gastrointestinal:   no vomiting.  No diarrhea.  No constipation. Genitourinary: Negative for dysuria. Musculoskeletal: Negative lower extremity swelling Skin: Negative for rash. Neurological: Negative for severe headaches, focal weakness or numbness.   ____________________________________________   PHYSICAL EXAM:  VITAL SIGNS: ED Triage Vitals  Enc Vitals Group     BP 08/01/18 1140 133/71     Pulse Rate 08/01/18 1140 82      Resp 08/01/18 1140 20     Temp 08/01/18 1140 98.9 F (37.2 C)     Temp Source 08/01/18 1140 Oral     SpO2 08/01/18 1138 96 %     Weight 08/01/18 1142 152 lb (68.9 kg)     Height 08/01/18 1142 _0  (1.6 m)     Head Circumference --      Peak Flow --      Pain Score 08/01/18 1141 10     Pain Loc --      Pain Edu? --      Excl. in [place]? --     Constitutional: Alert and oriented. Well appearing and in no acute distress.  Is mildly uncomfortable. Eyes: Conjunctivae are normal Head: Atraumatic HEENT: No congestion/rhinnorhea. Mucous membranes are moist.  Oropharynx non-erythematous Neck:   Nontender with no meningismus, no masses, no stridor Cardiovascular: Normal rate, regular rhythm. Grossly normal heart sounds.  Good peripheral circulation. Respiratory: Normal respiratory effort.  No retractions. Lungs  CTAB. Abdominal: Soft and right-sided discomfort but much more right CVA tenderness. No distention. No guarding no rebound Back:  There is no focal tenderness or step off.  there is no midline tenderness there are no lesions noted. there is +  right CVA tenderness Musculoskeletal: No lower extremity tenderness, no upper extremity tenderness. No joint effusions, no DVT signs strong distal pulses no edema Neurologic:  Normal speech and language. No gross focal neurologic deficits are appreciated.  Skin:  Skin is warm, dry and intact. No rash noted. Psychiatric: Mood and affect are normal. Speech and behavior are normal.  ____________________________________________   LABS (all labs ordered are listed, but only abnormal results are displayed)  Labs Reviewed  CBC WITH DIFFERENTIAL/PLATELET - Abnormal; Notable for the following components:      Result Value   WBC 26.8 (*)    All other components within normal limits  URINE CULTURE  URINALYSIS, COMPLETE (UACMP) WITH MICROSCOPIC  COMPREHENSIVE METABOLIC PANEL  LIPASE, BLOOD  TROPONIN I    Pertinent labs  results that were available  during my care of the patient were reviewed by me and considered in my medical decision making (see chart for details). ____________________________________________  EKG  I personally interpreted any EKGs ordered by me or triage  ____________________________________________  RADIOLOGY  Pertinent labs & imaging results that were available during my care of the patient were reviewed by me and considered in my medical decision making (see chart for details). If possible, patient and/or family made aware of any abnormal findings.  No results found. ____________________________________________    PROCEDURES  Procedure(s) performed: None  Procedures  Critical Care performed: None  ____________________________________________   INITIAL IMPRESSION / ASSESSMENT AND PLAN / ED COURSE  Pertinent labs & imaging results that were available during my care of the patient were reviewed by me and considered in my medical decision making (see chart for details).  Patient here with significant right flank pain, gradual onset since yesterday.  White count is also elevated the rest of her results are not back yet.  We will give her pain medication we will send a urine culture, we will obtain CT scan to rule out stone or infected stone, low suspicion for diverticular or situs.  Patient states she has had her gallbladder out.  We will see how she feels after fluid pain medication and we will obtain imaging and blood work and reassess   ____________________________________________   FINAL CLINICAL IMPRESSION(S) / ED DIAGNOSES  Final diagnoses:  None      This chart was dictated using voice recognition software.  Despite best efforts to proofread,  errors can occur which can change meaning.      Schuyler Amor, MD 08/01/18 1241

## 2018-08-01 NOTE — ED Notes (Signed)
ED TO INPATIENT HANDOFF REPORT  ED Nurse Name and Phone #: Aundra Millet [phone]  S Name/Age/Gender Consuello Masse 73 y.o. female Room/Bed: ED07A/ED07A  Code Status   Code Status: Prior  Home/SNF/Other Home Patient oriented to: self, place, time and situation Is this baseline? Yes   Triage Complete: Triage complete  Chief Complaint abd pain  Triage Note Pt came via [facility] from home. As per EMS pt had abd pain from last night 8PM. As per EMS history of stroke, HTN, diabetes. As per EMS pt have not taken the metformin this morning. Pt complaints of generalized abd pain 10/10, worse with movement.    Allergies Allergies  Allergen Reactions  . Metformin And Related Diarrhea    Level of Care/Admitting Diagnosis ED Disposition    ED Disposition Condition Comment   Admit  Hospital Area: [facility] [[id number]]  Level of Care: Med-Surg [16]  Diagnosis: Acute pyelonephritis [[id number]]  Admitting Physician: Houston Siren [[id number]]  Attending Physician: Houston Siren [[id number]]  Estimated length of stay: past midnight tomorrow  Certification:: I certify this patient will need inpatient services for at least 2 midnights  PT Class (Do Not Modify): Inpatient [101]  PT Acc Code (Do Not Modify): Private [1]       B Medical/Surgery History Past Medical History:  Diagnosis Date  . Depression    husband died in 2015/10/08  . DM (diabetes mellitus), 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  . Essential hypertension 11/01/2016  . GERD (gastroesophageal reflux disease)   . Mixed hyperlipidemia 11/01/2016  . Peripheral vascular disease ([facility])   . Renal insufficiency   . Stroke ([facility]) 10-07-2016   slight residual with speech. does not drive Lt side weakness   Past Surgical History:  Procedure Laterality Date  . CHOLECYSTECTOMY    . LOWER EXTREMITY ANGIOGRAPHY Left 12/31/2017   Procedure: LOWER EXTREMITY ANGIOGRAPHY;  Surgeon: Renford Dills, MD;  Location: [facility];  Service: Cardiovascular;  Laterality: Left;  . LOWER EXTREMITY ANGIOGRAPHY Right 04/22/2018   Procedure: LOWER EXTREMITY ANGIOGRAPHY;  Surgeon: Renford Dills, MD;  Location: [facility];  Service: Cardiovascular;  Laterality: Right;     A IV Location/Drains/Wounds Patient Lines/Drains/Airways Status   Active Line/Drains/Airways    Name:   Placement date:   Placement time:   Site:   Days:   Peripheral IV 12/31/17 Left Wrist   12/31/17    0733    Wrist   213   Peripheral IV 08/01/18   08/01/18    1356    -   less than 1   Sheath 12/31/17 Right Arterial;Femoral   12/31/17    0825    Arterial;Femoral   213   Sheath 12/31/17 Left Arterial;Femoral   12/31/17    0940    Arterial;Femoral   213          Intake/Output Last 24 hours  Intake/Output Summary (Last 24 hours) at 08/01/2018 1431 Last data filed at 08/01/2018 1429 Gross per 24 hour  Intake 99.1 ml  Output -  Net 99.1 ml    Labs/Imaging Results for orders placed or performed during the hospital encounter of 08/01/18 (from the past 48 hour(s))  CBC with Differential     Status: Abnormal   Collection Time: 08/01/18 11:44 AM  Result Value Ref Range   WBC 26.8 (H) 4.0 - 10.5 K/uL   RBC 4.13 3.87 - 5.11 MIL/uL   Hemoglobin 12.5 12.0 - 15.0 g/dL   HCT  36.0 36.0 - 46.0 %   MCV 87.2 80.0 - 100.0 fL   MCH 30.3 26.0 - 34.0 pg   MCHC 34.7 30.0 - 36.0 g/dL   RDW 46.9 50.7 - 22.5 %   Platelets 323 150 - 400 K/uL   nRBC 0.0 0.0 - 0.2 %   Neutrophils Relative % 90 %   Neutro Abs 24.3 (H) 1.7 - 7.7 K/uL   Lymphocytes Relative 3 %   Lymphs Abs 0.8 0.7 - 4.0 K/uL   Monocytes Relative 6 %   Monocytes Absolute 1.5 (H) 0.1 - 1.0 K/uL   Eosinophils Relative 0 %   Eosinophils Absolute 0.0 0.0 - 0.5 K/uL   Basophils Relative 0 %   Basophils Absolute 0.1 0.0 - 0.1 K/uL   WBC Morphology MORPHOLOGY UNREMARKABLE    RBC Morphology MORPHOLOGY UNREMARKABLE    Smear Review Normal platelet morphology    Immature Granulocytes 1  %   Abs Immature Granulocytes 0.15 (H) 0.00 - 0.07 K/uL    Comment: Performed at [facility], [address] [place], [place] [address]  Urinalysis, Complete w Microscopic     Status: Abnormal   Collection Time: 08/01/18 11:44 AM  Result Value Ref Range   Color, Urine YELLOW (A) YELLOW   APPearance CLOUDY (A) CLEAR   Specific Gravity, Urine 1.013 1.005 - 1.030   pH 6.0 5.0 - 8.0   Glucose, UA >=500 (A) NEGATIVE mg/dL   Hgb urine dipstick NEGATIVE NEGATIVE   Bilirubin Urine NEGATIVE NEGATIVE   Ketones, ur NEGATIVE NEGATIVE mg/dL   Protein, ur 833 (A) NEGATIVE mg/dL   Nitrite NEGATIVE NEGATIVE   Leukocytes,Ua MODERATE (A) NEGATIVE   RBC / HPF 0-5 0 - 5 RBC/hpf   WBC, UA >50 (H) 0 - 5 WBC/hpf   Bacteria, UA MANY (A) NONE SEEN   Squamous Epithelial / LPF NONE SEEN 0 - 5   WBC Clumps PRESENT    Mucus PRESENT     Comment: Performed at [facility], [address] [place], [place] [address]  Comprehensive metabolic panel     Status: Abnormal   Collection Time: 08/01/18 12:41 PM  Result Value Ref Range   Sodium 134 (L) 135 - 145 mmol/L   Potassium 5.1 3.5 - 5.1 mmol/L   Chloride 101 98 - 111 mmol/L   CO2 21 (L) 22 - 32 mmol/L   Glucose, Bld 304 (H) 70 - 99 mg/dL   BUN 30 (H) 8 - 23 mg/dL   Creatinine, Ser 8.98 (H) 0.44 - 1.00 mg/dL   Calcium 9.2 8.9 - 42.1 mg/dL   Total Protein 8.5 (H) 6.5 - 8.1 g/dL   Albumin 4.3 3.5 - 5.0 g/dL   AST 27 15 - 41 U/L   ALT 27 0 - 44 U/L   Alkaline Phosphatase 62 38 - 126 U/L   Total Bilirubin 0.7 0.3 - 1.2 mg/dL   GFR calc non Af Amer 52 (L) >60 mL/min   GFR calc Af Amer 60 (L) >60 mL/min   Anion gap 12 5 - 15    Comment: Performed at [facility], [address] [place], [place] [address]  Lipase, blood     Status: None   Collection Time: 08/01/18 12:41 PM  Result Value Ref Range   Lipase 44 11 - 51 U/L    Comment: Performed at [facility], [address] [place], [place] [address]  Troponin I -      Status: None  Collection Time: 08/01/18 12:41 PM  Result Value Ref Range   Troponin I <0.03 <0.03 ng/mL    Comment: Performed at [facility], [address], [place], [place] [address]   Ct Renal [place] [place]  Result Date: 08/01/2018 CLINICAL DATA:  73 year old female with acute RIGHT abdominal and flank pain. EXAM: CT ABDOMEN AND PELVIS WITHOUT CONTRAST TECHNIQUE: Multidetector CT imaging of the abdomen and pelvis was performed following the standard protocol without IV contrast. COMPARISON:  None. FINDINGS: Please note that parenchymal abnormalities may be missed without intravenous contrast. Lower chest: No acute abnormality. Hepatobiliary: The liver is unremarkable. Patient is status post cholecystectomy. No biliary dilatation. Pancreas: Unremarkable Spleen: Unremarkable Adrenals/Urinary Tract: RIGHT perinephric inflammation is identified with fullness of the RIGHT intrarenal collecting system. No obstructing urinary calculi noted. The LEFT kidney, adrenal glands and bladder are unremarkable. Stomach/Bowel: Stomach is within normal limits. No evidence of bowel wall thickening, distention, or inflammatory changes. Vascular/Lymphatic: Aortic atherosclerosis. Bilateral iliac stents noted. No enlarged abdominal or pelvic lymph nodes. Reproductive: Uterus and bilateral adnexa are unremarkable. Other: No ascites, focal collection or pneumoperitoneum. Musculoskeletal: No acute or suspicious bony abnormalities. Degenerative disc disease/spondylosis at L5-S1 noted. Grade 1 anterolisthesis of L4 on L5 is likely chronic. IMPRESSION: 1. Nonspecific RIGHT perinephric inflammation with fullness of the RIGHT intrarenal collecting system, likely representing infection. Pyelonephritis is difficult to determine on this noncontrast study. 2. No other acute abnormality. 3. Degenerative changes in the lumbar spine. 4.  Aortic Atherosclerosis (ICD10-I70.0). Electronically Signed   By: Harmon Pier M.D.   On:  08/01/2018 13:21    Pending Labs Unresulted Labs (From admission, onward)    Start     Ordered   08/01/18 1256  Lactic acid, plasma  Now then every 2 hours,   STAT     08/01/18 1255   08/01/18 1250  Culture, blood (routine x 2)  BLOOD CULTURE X 2,   STAT     08/01/18 1249   08/01/18 1238  Urine culture  Add-on,   AD     08/01/18 1237   Signed and Held  Basic metabolic panel  Tomorrow morning,   R     Signed and Held   Signed and Held  CBC  Tomorrow morning,   R     Signed and Held   Signed and Held  CBC  (enoxaparin (LOVENOX)    CrCl >/= 30 ml/min)  Once,   R    Comments:  Baseline for enoxaparin therapy IF NOT ALREADY DRAWN.  Notify MD if PLT < 100 K.    Signed and Held   Signed and Held  Creatinine, serum  (enoxaparin (LOVENOX)    CrCl >/= 30 ml/min)  Once,   R    Comments:  Baseline for enoxaparin therapy IF NOT ALREADY DRAWN.    Signed and Held   Signed and Held  Creatinine, serum  (enoxaparin (LOVENOX)    CrCl >/= 30 ml/min)  Weekly,   R    Comments:  while on enoxaparin therapy    Signed and Held          Vitals/Pain Today's Vitals   08/01/18 1138 08/01/18 1140 08/01/18 1141 08/01/18 1142  BP:  133/71    Pulse:  82    Resp:  20    Temp:  98.9 F (37.2 C)    TempSrc:  Oral    SpO2: 96% 100%    Weight:    68.9 kg  Height:    5'  3" (1.6 m)  PainSc:   10-Worst pain ever     Isolation Precautions No active isolations  Medications Medications  sodium chloride 0.9 % bolus 500 mL (500 mLs Intravenous New Bag/Given 08/01/18 1357)  fentaNYL (SUBLIMAZE) injection 50 mcg (50 mcg Intravenous Given 08/01/18 1349)  cefTRIAXone (ROCEPHIN) 1 g in sodium chloride 0.9 % 100 mL IVPB ( Intravenous Rate/Dose Verify 08/01/18 1429)    Mobility walks with device High fall risk   Focused Assessments GI Assessment   R Recommendations: See Admitting Provider Note  Report given to:   Additional Notes:

## 2018-08-01 NOTE — H&P (Signed)
[facility] [place] NAME: Reianna Batdorf    MR#:  [id number]  DATE OF BIRTH:  1945-09-02  DATE OF ADMISSION:  08/01/2018  PRIMARY CARE PHYSICIAN: [name], MD   REQUESTING/REFERRING PHYSICIAN: Dr. Charlotte Crumb  CHIEF COMPLAINT:   Chief Complaint  Patient presents with   Abdominal Pain    HISTORY OF PRESENT ILLNESS:  Bernise Sylvain  is a 73 y.o. female with a known history of hypertension, hyperlipidemia, GERD, diabetes, history of previous CVA, peripheral vascular disease, diabetes who presents to the hospital due to abdominal pain.  Patient's abdominal pain is located in the right flank/lower abdomen area nonradiating and associated with some nausea but no vomiting.  Patient's pain began yesterday all of a sudden and has progressively gotten worse and she is therefore came to the ER for further evaluation.  Patient does say that she has had frequent UTIs and she had 1 about 3 months ago which she has been treated for.  She presently does complain of some occasional dysuria and frequency but no other associated symptoms.  In the ER patient was noted to have a leukocytosis and urinalysis positive for UTI along with CT scan abdomen pelvis findings suggestive of pyelonephritis.  Hospitalist services were contacted for admission.  PAST MEDICAL HISTORY:   Past Medical History:  Diagnosis Date   Depression    husband died in 08-25-15   DM (diabetes mellitus), type 2 with complications ([place]) 6/60/6301   Essential hypertension 11/01/2016   GERD (gastroesophageal reflux disease)    Mixed hyperlipidemia 11/01/2016   Peripheral vascular disease ([place])    Renal insufficiency    Stroke ([place]) Aug 24, 2016   slight residual with speech. does not drive Lt side weakness    PAST SURGICAL HISTORY:   Past Surgical History:  Procedure Laterality Date   CHOLECYSTECTOMY     LOWER EXTREMITY ANGIOGRAPHY Left 12/31/2017   Procedure: LOWER EXTREMITY  ANGIOGRAPHY;  Surgeon: Katha Cabal, MD;  Location: [facility];  Service: Cardiovascular;  Laterality: Left;   LOWER EXTREMITY ANGIOGRAPHY Right 04/22/2018   Procedure: LOWER EXTREMITY ANGIOGRAPHY;  Surgeon: Katha Cabal, MD;  Location: [facility];  Service: Cardiovascular;  Laterality: Right;    SOCIAL HISTORY:   Social History   Tobacco Use   Smoking status: Former Smoker    Packs/day: 1.00    Years: 20.00    Pack years: 20.00    Types: Cigarettes    Last attempt to quit: Aug 25, 2010    Years since quitting: 8.2   Smokeless tobacco: Never Used  Substance Use Topics   Alcohol use: No    FAMILY HISTORY:   Family History  Problem Relation Age of Onset   Alzheimer's disease Mother    Arthritis Mother    Hypertension Mother    CVA Father    CAD Father    Heart disease Father    Stroke Father    Hypertension Father    Stroke Brother    Breast cancer Neg Hx     DRUG ALLERGIES:   Allergies  Allergen Reactions   Metformin And Related Diarrhea    REVIEW OF SYSTEMS:   Review of Systems  Constitutional: Negative for fever and weight loss.  HENT: Negative for congestion, nosebleeds and tinnitus.   Eyes: Negative for blurred vision, double vision and redness.  Respiratory: Negative for cough, hemoptysis and shortness of breath.   Cardiovascular: Negative for chest pain, orthopnea, leg swelling  and PND.  Gastrointestinal: Positive for abdominal pain and nausea. Negative for diarrhea, melena and vomiting.  Genitourinary: Positive for dysuria, flank pain and frequency. Negative for hematuria and urgency.  Musculoskeletal: Negative for falls and joint pain.  Neurological: Negative for dizziness, tingling, sensory change, focal weakness, seizures, weakness and headaches.  Endo/Heme/Allergies: Negative for polydipsia. Does not bruise/bleed easily.  Psychiatric/Behavioral: Negative for depression and memory loss. The patient is not  nervous/anxious.     MEDICATIONS AT HOME:   Prior to Admission medications   Medication Sig Start Date End Date Taking? Authorizing Provider  alendronate (FOSAMAX) 70 MG tablet TAKE 1 TABLET BY MOUTH EVERY 7 DAYS. TAKE WITH A FULL GLASS OF WATER ON AN EMPTY STOMACH. 07/10/18  Yes [name], MD  amLODipine (NORVASC) 10 MG tablet Take 1 tablet (10 mg total) by mouth daily. 12/26/17  Yes [name], MD  aspirin EC 81 MG EC tablet Take 1 tablet (81 mg total) by mouth daily. Patient taking differently: Take 81 mg by mouth daily. In the morning 10/03/16  Yes Vaughan Basta, MD  atorvastatin (LIPITOR) 40 MG tablet TAKE 1 TABLET BY MOUTH ONCE DAILY AT 6PM 05/05/18  Yes [name], MD  carvedilol (COREG) 3.125 MG tablet Take 1 tablet (3.125 mg total) by mouth 2 (two) times daily with a meal. 04/08/18  Yes [name], MD  clopidogrel (PLAVIX) 75 MG tablet Take 1 tablet (75 mg total) by mouth daily. 12/31/17  Yes Schnier, Dolores Lory, MD  metFORMIN (GLUCOPHAGE XR) 500 MG 24 hr tablet Take 1 tablet (500 mg total) by mouth daily with breakfast. 06/05/18  Yes [name], MD  Multiple Vitamin (MULTIVITAMIN WITH MINERALS) TABS tablet Take 1 tablet by mouth daily. Centrum Silver . In the morning   Yes [provider]  solifenacin (VESICARE) 5 MG tablet Take 1 tablet (5 mg total) by mouth daily. 06/30/18  Yes Stoioff, Ronda Fairly, MD  vitamin B-12 (CYANOCOBALAMIN) 1000 MCG tablet Take 1,000 mcg by mouth daily.    Yes [provider]  acetaminophen (TYLENOL) 500 MG tablet Take 1,000 mg by mouth every 6 (six) hours as needed (for pain.).    [provider]  blood glucose meter kit and supplies KIT Dispense based on patient and insurance preference. Use up to four times daily as directed. (FOR ICD-9 250.00, 250.01). 10/05/16   Schaevitz, Randall An, MD  hydrochlorothiazide (HYDRODIURIL) 12.5 MG tablet Take 12.5 mg by mouth daily. 06/07/18   [provider]  Polyethyl Glycol-Propyl Glycol (LUBRICANT EYE DROPS) 0.4-0.3 % SOLN Place 1 drop into both eyes 3 (three) times daily as needed (for dry eyes.).    [provider]      VITAL SIGNS:  Blood pressure 133/71, pulse 82, temperature 98.9 F (37.2 C), temperature source Oral, resp. rate 20, height _0  (1.6 m), weight 68.9 kg, SpO2 100 %.  PHYSICAL EXAMINATION:  Physical Exam  GENERAL:  73 y.o.-year-old patient lying in the bed in no acute distress.  EYES: Pupils equal, round, reactive to light and accommodation. No scleral icterus. Extraocular muscles intact.  HEENT: Head atraumatic, normocephalic. Oropharynx and nasopharynx clear. No oropharyngeal erythema, moist oral mucosa  NECK:  Supple, no jugular venous distention. No thyroid enlargement, no tenderness.  LUNGS: Normal breath sounds bilaterally, no wheezing, rales, rhonchi. No use of accessory muscles of respiration.  CARDIOVASCULAR: S1, S2 RRR. No murmurs, rubs, gallops, clicks.  ABDOMEN: Soft, tender in the right flank, right lower quadrant  but no rebound, rigidity nondistended. Bowel sounds present. No organomegaly or mass.  EXTREMITIES: No pedal edema, cyanosis, or clubbing. + 2 pedal & radial pulses b/l.   NEUROLOGIC: Cranial nerves II through XII are intact. No focal Motor or sensory deficits appreciated b/l PSYCHIATRIC: The patient is alert and oriented x 3.  SKIN: No obvious rash, lesion, or ulcer.   LABORATORY PANEL:   CBC Recent Labs  Lab 08/01/18 1144  WBC 26.8*  HGB 12.5  HCT 36.0  PLT 323   ------------------------------------------------------------------------------------------------------------------  Chemistries  Recent Labs  Lab 08/01/18 1241  NA 134*  K 5.1  CL 101  CO2 21*  GLUCOSE 304*  BUN 30*  CREATININE 1.06*  CALCIUM 9.2  AST 27  ALT 27  ALKPHOS 62  BILITOT 0.7    ------------------------------------------------------------------------------------------------------------------  Cardiac Enzymes Recent Labs  Lab 08/01/18 1241  TROPONINI <0.03   ------------------------------------------------------------------------------------------------------------------  RADIOLOGY:  Ct Renal Stone Study  Result Date: 08/01/2018 CLINICAL DATA:  73 year old female with acute RIGHT abdominal and flank pain. EXAM: CT ABDOMEN AND PELVIS WITHOUT CONTRAST TECHNIQUE: Multidetector CT imaging of the abdomen and pelvis was performed following the standard protocol without IV contrast. COMPARISON:  None. FINDINGS: Please note that parenchymal abnormalities may be missed without intravenous contrast. Lower chest: No acute abnormality. Hepatobiliary: The liver is unremarkable. Patient is status post cholecystectomy. No biliary dilatation. Pancreas: Unremarkable Spleen: Unremarkable Adrenals/Urinary Tract: RIGHT perinephric inflammation is identified with fullness of the RIGHT intrarenal collecting system. No obstructing urinary calculi noted. The LEFT kidney, adrenal glands and bladder are unremarkable. Stomach/Bowel: Stomach is within normal limits. No evidence of bowel wall thickening, distention, or inflammatory changes. Vascular/Lymphatic: Aortic atherosclerosis. Bilateral iliac stents noted. No enlarged abdominal or pelvic lymph nodes. Reproductive: Uterus and bilateral adnexa are unremarkable. Other: No ascites, focal collection or pneumoperitoneum. Musculoskeletal: No acute or suspicious bony abnormalities. Degenerative disc disease/spondylosis at L5-S1 noted. Grade 1 anterolisthesis of L4 on L5 is likely chronic. IMPRESSION: 1. Nonspecific RIGHT perinephric inflammation with fullness of the RIGHT intrarenal collecting system, likely representing infection. Pyelonephritis is difficult to determine on this noncontrast study. 2. No other acute abnormality. 3. Degenerative changes  in the lumbar spine. 4.  Aortic Atherosclerosis (ICD10-I70.0). Electronically Signed   By: Margarette Canada M.D.   On: 08/01/2018 13:21     IMPRESSION AND PLAN:   73 year old female with past medical history of diabetes, hypertension, hyperlipidemia, GERD,, history of previous CVA who presents to the hospital due to abdominal pain.  1.  Acute pyelonephritis-this is a cause of patient's abdominal pain nausea. - Patient has a positive urinalysis with CT scan findings suggestive of pyelonephritis. -We will treat patient with IV fluids, antiemetics, IV ceftriaxone, follow cultures and follow patient clinically.  2.  Sepsis-patient meets criteria given her leukocytosis, abnormal urinalysis and tachycardia. - We will treat the patient with IV fluids, IV ceftriaxone, follow cultures.  Follow hemodynamics.  3.  Leukocytosis-secondary to the acute pyelonephritis. -We will follow with IV antibiotic therapy.  4.  Essential hypertension-continue carvedilol, Norvasc.  5.  History of urinary incontinence-continue Enablex.  6.  Diabetes type 2 without complication-hold metformin, place on sliding scale insulin for now.  7.  Hyperlipidemia-continue atorvastatin.    All the records are reviewed and case discussed with ED provider. Management plans discussed with the patient, family and they are in agreement.  CODE STATUS: DNR  TOTAL TIME TAKING CARE OF THIS PATIENT: 40 minutes.    Henreitta Leber M.D on 08/01/2018 at 2:28 PM  Between 7am to 6pm - Pager - [phone]  After 6pm go to [email or web address] - password [email or web address] [facility]  [facility]  Office  [phone]  CC: Primary care physician; [name], MD

## 2018-08-01 NOTE — ED Notes (Signed)
This RN spoke with MD, notified MD regarding only being able to draw 1 set of cultures, per MD okay to start abx due to known UTI after in and out cath.

## 2018-08-01 NOTE — ED Notes (Signed)
Date and time results received: 08/01/18 2:59 PM  (use smartphrase ".now" to insert current time)  Test: Lactic Critical Value: 2.2  Name of Provider Notified: Mcshane  Orders Received? Or Actions Taken?: Critical Results Acknowledged

## 2018-08-01 NOTE — Progress Notes (Signed)
Notified Dr. Cherlynn Kaiser of most recent LA (2.3).  Acknowledged; no new orders.

## 2018-08-01 NOTE — ED Triage Notes (Signed)
Pt came via [facility] from home. As per EMS pt had abd pain from last night 8PM. As per EMS history of stroke, HTN, diabetes. As per EMS pt have not taken the metformin this morning. Pt complaints of generalized abd pain 10/10, worse with movement.

## 2018-08-02 LAB — CBC
HCT: 31.7 % — ABNORMAL LOW (ref 36.0–46.0)
Hemoglobin: 11 g/dL — ABNORMAL LOW (ref 12.0–15.0)
MCH: 30.6 pg (ref 26.0–34.0)
MCHC: 34.7 g/dL (ref 30.0–36.0)
MCV: 88.1 fL (ref 80.0–100.0)
Platelets: 262 10*3/uL (ref 150–400)
RBC: 3.6 MIL/uL — ABNORMAL LOW (ref 3.87–5.11)
RDW: 12.7 % (ref 11.5–15.5)
WBC: 29.9 10*3/uL — AB (ref 4.0–10.5)
nRBC: 0 % (ref 0.0–0.2)

## 2018-08-02 LAB — BASIC METABOLIC PANEL
Anion gap: 10 (ref 5–15)
BUN: 26 mg/dL — AB (ref 8–23)
CO2: 19 mmol/L — ABNORMAL LOW (ref 22–32)
Calcium: 8.3 mg/dL — ABNORMAL LOW (ref 8.9–10.3)
Chloride: 106 mmol/L (ref 98–111)
Creatinine, Ser: 1.12 mg/dL — ABNORMAL HIGH (ref 0.44–1.00)
GFR calc Af Amer: 56 mL/min — ABNORMAL LOW (ref >60)
GFR calc non Af Amer: 48 mL/min — ABNORMAL LOW (ref >60)
Glucose, Bld: 183 mg/dL — ABNORMAL HIGH (ref 70–99)
Potassium: 3.9 mmol/L (ref 3.5–5.1)
Sodium: 135 mmol/L (ref 135–145)

## 2018-08-02 LAB — GLUCOSE, CAPILLARY
Glucose-Capillary: 194 mg/dL — ABNORMAL HIGH (ref 70–99)
Glucose-Capillary: 193 mg/dL — ABNORMAL HIGH (ref 70–99)
Glucose-Capillary: 198 mg/dL — ABNORMAL HIGH (ref 70–99)
Glucose-Capillary: 171 mg/dL — ABNORMAL HIGH (ref 70–99)

## 2018-08-02 MED ORDER — CHLORHEXIDINE GLUCONATE CLOTH 2 % EX PADS
6.0000 | MEDICATED_PAD | Freq: Every day | CUTANEOUS | Status: DC
  Administered 2018-08-04: 6 via TOPICAL

## 2018-08-02 MED ORDER — SODIUM CHLORIDE 0.9 % IV BOLUS
1000.0000 mL | Freq: Once | INTRAVENOUS | Status: AC
  Administered 2018-08-02: 1000 mL via INTRAVENOUS

## 2018-08-02 MED ORDER — HYDROCODONE-ACETAMINOPHEN 5-325 MG PO TABS
1.0000 | ORAL_TABLET | Freq: Four times a day (QID) | ORAL | Status: DC | PRN
  Administered 2018-08-02: 1 via ORAL
  Administered 2018-08-03 – 2018-08-04 (×3): 2 via ORAL
  Filled 2018-08-02: qty 2
  Filled 2018-08-02: qty 1
  Filled 2018-08-02 (×2): qty 2

## 2018-08-02 MED ORDER — MUPIROCIN 2 % EX OINT
1.0000 "application " | TOPICAL_OINTMENT | Freq: Two times a day (BID) | CUTANEOUS | Status: DC
  Administered 2018-08-02 – 2018-08-04 (×5): 1 via NASAL
  Filled 2018-08-02: qty 22

## 2018-08-02 NOTE — Progress Notes (Signed)
[facility] [place] [facility]   PATIENT NAME: Crystal Miller    MR#:  [id number]  DATE OF BIRTH:  1945/10/05  SUBJECTIVE:   Patient with improved symptoms.  REVIEW OF SYSTEMS:    Review of Systems  Constitutional: Negative for fever, chills weight loss HENT: Negative for ear pain, nosebleeds, congestion, facial swelling, rhinorrhea, neck pain, neck stiffness and ear discharge.   Respiratory: Negative for cough, shortness of breath, wheezing  Cardiovascular: Negative for chest pain, palpitations and leg swelling.  Gastrointestinal: Negative for heartburn, abdominal pain, vomiting, diarrhea or consitpation Genitourinary: Negative for dysuria, urgency, frequency, hematuria Musculoskeletal: Negative for back pain or joint pain Neurological: Negative for dizziness, seizures, syncope, focal weakness,  numbness and headaches.  Hematological: Does not bruise/bleed easily.  Psychiatric/Behavioral: Negative for hallucinations, confusion, dysphoric mood    Tolerating Diet: yes      DRUG ALLERGIES:   Allergies  Allergen Reactions  . Metformin And Related Diarrhea    VITALS:  Blood pressure (!) 130/52, pulse 92, temperature 98.7 F (37.1 C), temperature source Oral, resp. rate 19, height 5\' 3"  (1.6 m), weight 68.9 kg, SpO2 94 %.  PHYSICAL EXAMINATION:  Constitutional: Appears well-developed and well-nourished. No distress. HENT: Normocephalic. Marland Kitchen Oropharynx is clear and moist.  Eyes: Conjunctivae and EOM are normal. PERRLA, no scleral icterus.  Neck: Normal ROM. Neck supple. No JVD. No tracheal deviation. CVS: RRR, S1/S2 +, no murmurs, no gallops, no carotid bruit.  Pulmonary: Effort and breath sounds normal, no stridor, rhonchi, wheezes, rales.  Abdominal: Soft. BS +,  no distension, tenderness, rebound or guarding.  Musculoskeletal: Normal range of motion. No edema and no tenderness.  Neuro: Alert. CN 2-12 grossly intact. No focal deficits. Skin: Skin is  warm and dry. No rash noted. Psychiatric: Normal mood and affect.      LABORATORY PANEL:   CBC Recent Labs  Lab 08/02/18 0422  WBC 29.9*  HGB 11.0*  HCT 31.7*  PLT 262   ------------------------------------------------------------------------------------------------------------------  Chemistries  Recent Labs  Lab 08/01/18 1241 08/02/18 0422  NA 134* 135  K 5.1 3.9  CL 101 106  CO2 21* 19*  GLUCOSE 304* 183*  BUN 30* 26*  CREATININE 1.06* 1.12*  CALCIUM 9.2 8.3*  AST 27  --   ALT 27  --   ALKPHOS 62  --   BILITOT 0.7  --    ------------------------------------------------------------------------------------------------------------------  Cardiac Enzymes Recent Labs  Lab 08/01/18 1241  TROPONINI <0.03   ------------------------------------------------------------------------------------------------------------------  RADIOLOGY:  Ct Renal Stone Study  Result Date: 08/01/2018 CLINICAL DATA:  73 year old female with acute RIGHT abdominal and flank pain. EXAM: CT ABDOMEN AND PELVIS WITHOUT CONTRAST TECHNIQUE: Multidetector CT imaging of the abdomen and pelvis was performed following the standard protocol without IV contrast. COMPARISON:  None. FINDINGS: Please note that parenchymal abnormalities may be missed without intravenous contrast. Lower chest: No acute abnormality. Hepatobiliary: The liver is unremarkable. Patient is status post cholecystectomy. No biliary dilatation. Pancreas: Unremarkable Spleen: Unremarkable Adrenals/Urinary Tract: RIGHT perinephric inflammation is identified with fullness of the RIGHT intrarenal collecting system. No obstructing urinary calculi noted. The LEFT kidney, adrenal glands and bladder are unremarkable. Stomach/Bowel: Stomach is within normal limits. No evidence of bowel wall thickening, distention, or inflammatory changes. Vascular/Lymphatic: Aortic atherosclerosis. Bilateral iliac stents noted. No enlarged abdominal or pelvic lymph  nodes. Reproductive: Uterus and bilateral adnexa are unremarkable. Other: No ascites, focal collection or pneumoperitoneum. Musculoskeletal: No acute or suspicious bony abnormalities. Degenerative disc disease/spondylosis at L5-S1 noted. Grade  1 anterolisthesis of L4 on L5 is likely chronic. IMPRESSION: 1. Nonspecific RIGHT perinephric inflammation with fullness of the RIGHT intrarenal collecting system, likely representing infection. Pyelonephritis is difficult to determine on this noncontrast study. 2. No other acute abnormality. 3. Degenerative changes in the lumbar spine. 4.  Aortic Atherosclerosis (ICD10-I70.0). Electronically Signed   By: Harmon Pier M.D.   On: 08/01/2018 13:21     ASSESSMENT AND PLAN:   73 year old female with history of hypertension who presents to the emergency room due to abdominal pain.  1.  Acute pyelonephritis with right sided abdominal pain: Abdominal pain has subsided. CT findings consistent with acute pyelonephritis Continue Rocephin and follow-up on final urine culture. Follow CBC  2.  Sepsis with leukocytosis and tachycardia upon admission: Sepsis has improved. Follow-up on final cultures  3.  Hypertension: Continue Coreg and Norvasc  4.  History of urinary incontinence: Continue Enablex  5.  Diabetes: Continue sliding scale  6.  Hyperlipidemia: Continue statin      Management plans discussed with the patient and she is in agreement.  CODE STATUS: DNR  TOTAL TIME TAKING CARE OF THIS PATIENT: 26 minutes.     POSSIBLE D/C tomorrow, DEPENDING ON CLINICAL CONDITION.   Crystal Miller M.D on 08/02/2018 at 12:26 PM  Between 7am to 6pm - Pager - [phone] After 6pm go to [email or web address] - Social research officer, government  [facility]  Office  [phone]  CC: Primary care physician; Glori Luis, MD  Note: This dictation was prepared with Dragon dictation along with smaller phrase technology. Any transcriptional errors that result  from this process are unintentional.

## 2018-08-02 NOTE — Evaluation (Signed)
Physical Therapy Evaluation Patient Details Name: Crystal Miller MRN: [id number] DOB: [birth year] Today's Date: 08/02/2018   History of Present Illness  Pt is arrived to ED 3/13 with abdominal pain consistent with imaging of pyelonephritis. Patient with known history of cerebellar stroke, HTN, DM, and renal disease. Lives at home with daughter and grandson. Uses rollator for ind household and community ambulation. Difficulty using restroom, reports she is unable to get to bathroom "in time" often and son reports she has bathroom hygiene issues.   Clinical Impression  Patient with current impairments in endurance, balance, coordination, general strength, and pain. Patient is unable to ambulate community distances safely, or perform dynamic gait tasks safely; inhibiting her from cleaning ADLs, and church going. Would benefit from skilled PT to address above deficits and promote optimal return to PLOF     Follow Up Recommendations SNF    Equipment Recommendations  Rolling walker with 5" wheels    Recommendations for Other Services       Precautions / Restrictions Precautions Precautions: Fall Restrictions Weight Bearing Restrictions: No      Mobility  Bed Mobility Overal bed mobility: Modified Independent             General bed mobility comments: Increased time and use of handrails d/t abdominal pain  Transfers Overall transfer level: Modified independent Equipment used: Rolling walker (2 wheeled)             General transfer comment: Able to transfer modI with RW following PT cuing for safety with hand placement  Ambulation/Gait Ambulation/Gait assistance: Min assist Gait Distance (Feet): 40 Feet Assistive device: Rolling walker (2 wheeled) Gait Pattern/deviations: Shuffle Gait velocity: decreased   General Gait Details: Patient able to ambulate with shuffle steps and continued cuing for safety with RW managemnet. Patient with R sided attention and veers to R with  ambulation. Despite PT cuing she is not able to correct this and requires physical assistance to turn to her L   Stairs            Wheelchair Mobility    Modified Rankin (Stroke Patients Only)       Balance Overall balance assessment: Needs assistance   Sitting balance-Leahy Scale: Good       Standing balance-Leahy Scale: Fair   Single Leg Stance - Right Leg: 0 Single Leg Stance - Left Leg: 0 Tandem Stance - Right Leg: 0 Tandem Stance - Left Leg: 0 Rhomberg - Eyes Opened: 3 Rhomberg - Eyes Closed: 0                 Pertinent Vitals/Pain Pain Assessment: Faces Faces Pain Scale: Hurts whole lot Pain Location: abdomen with transfers Pain Descriptors / Indicators: Aching Pain Intervention(s): Monitored during session    Home Living Family/patient expects to be discharged to:: Private residence Living Arrangements: Children;Other relatives Available Help at Discharge: Family;Available 24 hours/day Type of Home: Apartment Home Access: Level entry     Home Layout: One level Home Equipment: Shower seat - built in;Grab bars - tub/shower;Grab bars - toilet;Bedside commode Additional Comments: uses rollator for ambulation; notes that she cannot et to the bathroom fast enough and often uses bedside commode    Prior Function Level of Independence: Independent with assistive device(s)         Comments: Ind in ADLs and community ambulation (does not drive); reports no falls, son reports patient has difficulty      Hand Dominance        Extremity/Trunk Assessment  Upper Extremity Assessment Upper Extremity Assessment: Overall WFL for tasks assessed    Lower Extremity Assessment Lower Extremity Assessment: Overall WFL for tasks assessed    Cervical / Trunk Assessment Cervical / Trunk Assessment: Normal  Communication   Communication: No difficulties  Cognition Arousal/Alertness: Awake/alert Behavior During Therapy: WFL for tasks  assessed/performed Overall Cognitive Status: Within Functional Limits for tasks assessed                                        General Comments      Exercises Other Exercises Other Exercises: Cuing to reduce pain with supine <> sit, which patient is able to complete with some continued pain. PT cued patient for STS transfer for safety with walker management, which she is able to comply with following. During ambulation PT cued patient over 61ft to increase step length and for RW management and safety. Patient with L neglect causing some difficulty with balance with gait and PT assistance to manage RW and turn patient to L to turn back to room   Assessment/Plan    PT Assessment Patient needs continued PT services  PT Problem List Decreased strength;Decreased coordination;Pain;Decreased range of motion;Decreased activity tolerance;Decreased balance;Decreased mobility;Decreased safety awareness       PT Treatment Interventions DME instruction;Therapeutic exercise;Gait training;Balance training;Neuromuscular re-education;Stair training;Manual techniques;Patient/family education;Therapeutic activities;Functional mobility training    PT Goals (Current goals can be found in the Care Plan section)  Acute Rehab PT Goals Patient Stated Goal: walk with rollator ind PT Goal Formulation: With patient/family Time For Goal Achievement: 08/16/18 Potential to Achieve Goals: Fair    Frequency Min 2X/week   Barriers to discharge        Co-evaluation               AM-PAC PT "6 Clicks" Mobility  Outcome Measure Help needed turning from your back to your side while in a flat bed without using bedrails?: A Little Help needed moving from lying on your back to sitting on the side of a flat bed without using bedrails?: A Little Help needed moving to and from a bed to a chair (including a wheelchair)?: A Little Help needed standing up from a chair using your arms (e.g.,  wheelchair or bedside chair)?: A Little Help needed to walk in hospital room?: A Lot Help needed climbing 3-5 steps with a railing? : Total 6 Click Score: 15    End of Session Equipment Utilized During Treatment: Gait belt Activity Tolerance: Patient tolerated treatment well Patient left: in bed;with family/visitor present;with call bell/phone within reach;with bed alarm set   PT Visit Diagnosis: Unsteadiness on feet (R26.81);Other abnormalities of gait and mobility (R26.89);Muscle weakness (generalized) (M62.81);Difficulty in walking, not elsewhere classified (R26.2)    Time: 0981-1914 PT Time Calculation (min) (ACUTE ONLY): 33 min   Charges:   PT Evaluation $PT Eval Moderate Complexity: 1 Mod PT Treatments $Gait Training: 8-22 mins        Staci Acosta PT, DPT   [place] 08/02/2018, 1:26 PM

## 2018-08-03 LAB — GLUCOSE, CAPILLARY
GLUCOSE-CAPILLARY: 174 mg/dL — AB (ref 70–99)
Glucose-Capillary: 165 mg/dL — ABNORMAL HIGH (ref 70–99)
Glucose-Capillary: 180 mg/dL — ABNORMAL HIGH (ref 70–99)
Glucose-Capillary: 212 mg/dL — ABNORMAL HIGH (ref 70–99)

## 2018-08-03 LAB — CBC
HCT: 28.3 % — ABNORMAL LOW (ref 36.0–46.0)
Hemoglobin: 9.7 g/dL — ABNORMAL LOW (ref 12.0–15.0)
MCH: 30.1 pg (ref 26.0–34.0)
MCHC: 34.3 g/dL (ref 30.0–36.0)
MCV: 87.9 fL (ref 80.0–100.0)
PLATELETS: 222 10*3/uL (ref 150–400)
RBC: 3.22 MIL/uL — ABNORMAL LOW (ref 3.87–5.11)
RDW: 12.8 % (ref 11.5–15.5)
WBC: 24.5 10*3/uL — ABNORMAL HIGH (ref 4.0–10.5)
nRBC: 0 % (ref 0.0–0.2)

## 2018-08-03 LAB — URINE CULTURE: Culture: >=100000 — AB

## 2018-08-03 NOTE — Progress Notes (Signed)
[facility] - [place] [facility]   PATIENT NAME: Crystal Miller    MR#:  [id number]  DATE OF BIRTH:  04/04/1946  SUBJECTIVE:   Patient has improved symptoms with some lower right-sided pain.  Tolerating her diet well.  No fevers overnight.  Denies dysuria, frequency or urgency today.  REVIEW OF SYSTEMS:    Review of Systems  Constitutional: Negative for fever, chills weight loss HENT: Negative for ear pain, nosebleeds, congestion, facial swelling, rhinorrhea, neck pain, neck stiffness and ear discharge.   Respiratory: Negative for cough, shortness of breath, wheezing  Cardiovascular: Negative for chest pain, palpitations and leg swelling.  Gastrointestinal: Negative for heartburn, abdominal pain, vomiting, diarrhea or consitpation Genitourinary: Negative for dysuria, urgency, frequency, hematuria Musculoskeletal: Negative for back pain or joint pain Neurological: Negative for dizziness, seizures, syncope, focal weakness,  numbness and headaches.  Hematological: Does not bruise/bleed easily.  Psychiatric/Behavioral: Negative for hallucinations, confusion, dysphoric mood    Tolerating Diet: yes      DRUG ALLERGIES:   Allergies  Allergen Reactions  . Metformin And Related Diarrhea    VITALS:  Blood pressure (!) 163/63, pulse 95, temperature 98.8 F (37.1 C), temperature source Oral, resp. rate 20, height 5\' 3"  (1.6 m), weight 68.9 kg, SpO2 93 %.  PHYSICAL EXAMINATION:  Constitutional: Appears well-developed and well-nourished. No distress. HENT: Normocephalic. Marland Kitchen Oropharynx is clear and moist.  Eyes: Conjunctivae and EOM are normal. PERRLA, no scleral icterus.  Neck: Normal ROM. Neck supple. No JVD. No tracheal deviation. CVS: RRR, S1/S2 +, no murmurs, no gallops, no carotid bruit.  Pulmonary: Effort and breath sounds normal, no stridor, rhonchi, wheezes, rales.  Abdominal: Soft. BS +,  no distension, tenderness, rebound or guarding.  Musculoskeletal:  Normal range of motion. No edema and no tenderness.  Neuro: Alert. CN 2-12 grossly intact. No focal deficits. Skin: Skin is warm and dry. No rash noted. Psychiatric: Normal mood and affect.      LABORATORY PANEL:   CBC Recent Labs  Lab 08/03/18 0406  WBC 24.5*  HGB 9.7*  HCT 28.3*  PLT 222   ------------------------------------------------------------------------------------------------------------------  Chemistries  Recent Labs  Lab 08/01/18 1241 08/02/18 0422  NA 134* 135  K 5.1 3.9  CL 101 106  CO2 21* 19*  GLUCOSE 304* 183*  BUN 30* 26*  CREATININE 1.06* 1.12*  CALCIUM 9.2 8.3*  AST 27  --   ALT 27  --   ALKPHOS 62  --   BILITOT 0.7  --    ------------------------------------------------------------------------------------------------------------------  Cardiac Enzymes Recent Labs  Lab 08/01/18 1241  TROPONINI <0.03   ------------------------------------------------------------------------------------------------------------------  RADIOLOGY:  Ct Renal Stone Study  Result Date: 08/01/2018 CLINICAL DATA:  73 year old female with acute RIGHT abdominal and flank pain. EXAM: CT ABDOMEN AND PELVIS WITHOUT CONTRAST TECHNIQUE: Multidetector CT imaging of the abdomen and pelvis was performed following the standard protocol without IV contrast. COMPARISON:  None. FINDINGS: Please note that parenchymal abnormalities may be missed without intravenous contrast. Lower chest: No acute abnormality. Hepatobiliary: The liver is unremarkable. Patient is status post cholecystectomy. No biliary dilatation. Pancreas: Unremarkable Spleen: Unremarkable Adrenals/Urinary Tract: RIGHT perinephric inflammation is identified with fullness of the RIGHT intrarenal collecting system. No obstructing urinary calculi noted. The LEFT kidney, adrenal glands and bladder are unremarkable. Stomach/Bowel: Stomach is within normal limits. No evidence of bowel wall thickening, distention, or  inflammatory changes. Vascular/Lymphatic: Aortic atherosclerosis. Bilateral iliac stents noted. No enlarged abdominal or pelvic lymph nodes. Reproductive: Uterus and bilateral adnexa are unremarkable.  Other: No ascites, focal collection or pneumoperitoneum. Musculoskeletal: No acute or suspicious bony abnormalities. Degenerative disc disease/spondylosis at L5-S1 noted. Grade 1 anterolisthesis of L4 on L5 is likely chronic. IMPRESSION: 1. Nonspecific RIGHT perinephric inflammation with fullness of the RIGHT intrarenal collecting system, likely representing infection. Pyelonephritis is difficult to determine on this noncontrast study. 2. No other acute abnormality. 3. Degenerative changes in the lumbar spine. 4.  Aortic Atherosclerosis (ICD10-I70.0). Electronically Signed   By: Harmon Pier M.D.   On: 08/01/2018 13:21     ASSESSMENT AND PLAN:   73 year old female with history of hypertension who presents to the emergency room due to abdominal pain.  1.  E. coli acute pyelonephritis with right sided abdominal pain: CT findings consistent with acute pyelonephritis Continue Rocephin and follow-up on final urine culture sensitivities. White blood cell count with some improvement. Would like to see the white blood cell count lower tomorrow prior to discharge.  2.  Sepsis with leukocytosis and tachycardia upon admission: Sepsis has improved. Follow-up on final cultures.   3.  Hypertension: Continue Coreg and Norvasc  4.  History of urinary incontinence: Continue Enablex  5.  Diabetes: Continue sliding scale  6.  Hyperlipidemia: Continue statin   Physical therapy has recommended skilled nursing facility.  [place] Child psychotherapist consult has been placed.  Patient would like to be discharged to skilled nursing facility.   Management plans discussed with the patient and she is in agreement.  CODE STATUS: DNR  TOTAL TIME TAKING CARE OF THIS PATIENT: 25 minutes.     POSSIBLE D/C tomorrow,  DEPENDING ON CLINICAL CONDITION.   Prachi Oftedahl M.D on 08/03/2018 at 9:06 AM  Between 7am to 6pm - Pager - [phone] After 6pm go to [email or web address] - Social research officer, government  [facility]  Office  [phone]  CC: Primary care physician; Glori Luis, MD  Note: This dictation was prepared with Dragon dictation along with smaller phrase technology. Any transcriptional errors that result from this process are unintentional.

## 2018-08-03 NOTE — NC FL2 (Signed)
[place] MEDICAID FL2 LEVEL OF CARE SCREENING TOOL     IDENTIFICATION  Patient Name: Crystal Miller Birthdate: [birth year] Sex: female Admission Date (Current Location): 08/01/2018  [place] and [place] Number:  Chiropodist and Address:  [facility], [address], [place], [place] [address]      Provider Number: [id number]  Attending Physician Name and Address:  Adrian Saran, MD  Relative Name and Phone Number:  Doyle Askew (Sister) [phone], Westley Chandler (Daughter) [phone], Alanda Amass [facility]) [phone]    Current Level of Care: Hospital Recommended Level of Care: Skilled Nursing Facility Prior Approval Number:    Date Approved/Denied:   PASRR Number: [id number]  Discharge Plan: SNF    Current Diagnoses: Patient Active Problem List   Diagnosis Date Noted  . Acute pyelonephritis 08/01/2018  . Overactive bladder 06/30/2018  . Acute right-sided low back pain without sciatica 02/19/2018  . Depression, major, single episode, complete remission (HCC) 02/19/2018  . Right hip pain 10/30/2017  . Urine frequency 10/30/2017  . PAD (peripheral artery disease) (HCC) 10/10/2017  . Decreased pedal pulses 07/30/2017  . Right pontine stroke (HCC) 03/27/2017  . DM (diabetes mellitus), 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  . Essential hypertension 11/01/2016  . Mixed hyperlipidemia 11/01/2016  . Memory change 11/01/2016  . History of stroke 10/01/2016    Orientation RESPIRATION BLADDER Height & Weight     Self, Time, Situation, Place  Normal Continent Weight: 152 lb (68.9 kg) Height:  5\' 3"  (160 cm)  BEHAVIORAL SYMPTOMS/MOOD NEUROLOGICAL BOWEL NUTRITION STATUS      Continent Diet(heart healthy/carb modified)  AMBULATORY STATUS COMMUNICATION OF NEEDS Skin   Extensive Assist Verbally Normal                       Personal Care Assistance Level of Assistance  Feeding, Dressing, Bathing Bathing Assistance: Limited  assistance Feeding assistance: Independent Dressing Assistance: Limited assistance     Functional Limitations Info  Sight, Hearing, Speech Sight Info: Adequate Hearing Info: Adequate Speech Info: Adequate    SPECIAL CARE FACTORS FREQUENCY  PT (By licensed PT)     PT Frequency: 5X              Contractures Contractures Info: Not present    Additional Factors Info  Code Status, Allergies, Isolation Precautions Code Status Info: DNR Allergies Info: Metformin and related     Isolation Precautions Info: MRSA contact precautions     Current Medications (08/03/2018):  This is the current hospital active medication list Current Facility-Administered Medications  Medication Dose Route Frequency Provider Last Rate Last Dose  . acetaminophen (TYLENOL) tablet 650 mg  650 mg Oral Q6H PRN Houston Siren, MD   650 mg at 08/02/18 1704   Or  . acetaminophen (TYLENOL) suppository 650 mg  650 mg Rectal Q6H PRN Houston Siren, MD      . amLODipine (NORVASC) tablet 10 mg  10 mg Oral Daily Houston Siren, MD   10 mg at 08/03/18 0817  . aspirin EC tablet 81 mg  81 mg Oral Daily Houston Siren, MD   81 mg at 08/03/18 0817  . atorvastatin (LIPITOR) tablet 40 mg  40 mg Oral q1800 Houston Siren, MD   40 mg at 08/03/18 1621  . carvedilol (COREG) tablet 3.125 mg  3.125 mg Oral BID WC Houston Siren, MD   3.125 mg at 08/03/18 1621  . cefTRIAXone (ROCEPHIN) 1 g in sodium chloride  0.9 % 100 mL IVPB  1 g Intravenous Q24H Houston Siren, MD 200 mL/hr at 08/03/18 0820 1 g at 08/03/18 0820  . Chlorhexidine Gluconate Cloth 2 % PADS 6 each  6 each Topical Q0600 Mody, Sital, MD      . clopidogrel (PLAVIX) tablet 75 mg  75 mg Oral Daily Houston Siren, MD   75 mg at 08/03/18 0817  . darifenacin (ENABLEX) 24 hr tablet 7.5 mg  7.5 mg Oral Daily Houston Siren, MD   7.5 mg at 08/03/18 1112  . enoxaparin (LOVENOX) injection 40 mg  40 mg Subcutaneous Q24H Houston Siren, MD   40 mg at 08/02/18  2232  . HYDROcodone-acetaminophen (NORCO/VICODIN) 5-325 MG per tablet 1-2 tablet  1-2 tablet Oral Q6H PRN Bertrum Sol, MD   2 tablet at 08/03/18 1621  . insulin aspart (novoLOG) injection 0-5 Units  0-5 Units Subcutaneous QHS Houston Siren, MD   2 Units at 08/01/18 2318  . insulin aspart (novoLOG) injection 0-9 Units  0-9 Units Subcutaneous TID WC Houston Siren, MD   3 Units at 08/03/18 1620  . multivitamin with minerals tablet 1 tablet  1 tablet Oral Daily Houston Siren, MD   1 tablet at 08/03/18 0817  . mupirocin ointment (BACTROBAN) 2 % 1 application  1 application Nasal BID Adrian Saran, MD   1 application at 08/03/18 0818  . ondansetron (ZOFRAN) tablet 4 mg  4 mg Oral Q6H PRN Houston Siren, MD       Or  . ondansetron (ZOFRAN) injection 4 mg  4 mg Intravenous Q6H PRN Houston Siren, MD      . vitamin B-12 (CYANOCOBALAMIN) tablet 1,000 mcg  1,000 mcg Oral Daily Houston Siren, MD   1,000 mcg at 08/03/18 7371     Discharge Medications: Please see discharge summary for a list of discharge medications.  Relevant Imaging Results:  Relevant Lab Results:   Additional Information SS#[id number]  Judi Cong, LCSW

## 2018-08-04 LAB — CBC
HEMATOCRIT: 28.8 % — AB (ref 36.0–46.0)
HEMOGLOBIN: 10 g/dL — AB (ref 12.0–15.0)
MCH: 30.4 pg (ref 26.0–34.0)
MCHC: 34.7 g/dL (ref 30.0–36.0)
MCV: 87.5 fL (ref 80.0–100.0)
Platelets: 230 10*3/uL (ref 150–400)
RBC: 3.29 MIL/uL — ABNORMAL LOW (ref 3.87–5.11)
RDW: 12.8 % (ref 11.5–15.5)
WBC: 19.9 10*3/uL — ABNORMAL HIGH (ref 4.0–10.5)
nRBC: 0 % (ref 0.0–0.2)

## 2018-08-04 LAB — GLUCOSE, CAPILLARY
GLUCOSE-CAPILLARY: 207 mg/dL — AB (ref 70–99)
Glucose-Capillary: 172 mg/dL — ABNORMAL HIGH (ref 70–99)
Glucose-Capillary: 168 mg/dL — ABNORMAL HIGH (ref 70–99)

## 2018-08-04 MED ORDER — CEPHALEXIN 250 MG PO CAPS
250.0000 mg | ORAL_CAPSULE | Freq: Four times a day (QID) | ORAL | Status: DC
  Administered 2018-08-04 (×2): 250 mg via ORAL
  Filled 2018-08-04 (×2): qty 1

## 2018-08-04 MED ORDER — SODIUM CHLORIDE 0.9 % IV SOLN
INTRAVENOUS | Status: DC | PRN
  Administered 2018-08-04: 08:00:00 via INTRAVENOUS

## 2018-08-04 MED ORDER — CEPHALEXIN 250 MG PO CAPS: 250.0000 mg | ORAL_CAPSULE | Freq: Four times a day (QID) | ORAL | 0 refills | Status: AC

## 2018-08-04 NOTE — Discharge Summary (Signed)
[facility] NAME: Crystal Miller    MR#:  [id number]  DATE OF BIRTH:  09-14-1945  DATE OF ADMISSION:  08/01/2018 ADMITTING PHYSICIAN: Henreitta Leber, MD  DATE OF DISCHARGE: 08/04/2018   PRIMARY CARE PHYSICIAN: [name], MD    ADMISSION DIAGNOSIS:  Pyelonephritis [N12]  DISCHARGE DIAGNOSIS:  Active Problems:   Acute pyelonephritis   SECONDARY DIAGNOSIS:   Past Medical History:  Diagnosis Date  . Depression    husband died in 2015/08/06  . DM (diabetes mellitus), type 2 with complications ([place]) 08/27/8117  . Essential hypertension 11/01/2016  . GERD (gastroesophageal reflux disease)   . Mixed hyperlipidemia 11/01/2016  . Peripheral vascular disease ([place])   . Renal insufficiency   . Stroke ([place]) August 05, 2016   slight residual with speech. does not drive Lt side weakness    HOSPITAL COURSE:   73 year old female with history of hypertension who presents to the emergency room due to abdominal pain.  1.  E. coli acute pyelonephritis with right sided abdominal pain: CT findings consistent with acute pyelonephritis Patient's E. coli is pansensitive.  She will be discharged on Keflex.  She will follow-up with her primary care physician for repeat urine analysis in 10 to 14 days.  White blood cell count has improved.  2.  Sepsis with leukocytosis and tachycardia upon admission: Sepsis has improved.    3.  Hypertension: Continue Coreg and Norvasc  4.  History of urinary incontinence: Continue Enablex  5.  Diabetes: Continue ADA diet with outpatient regimen  6.  Hyperlipidemia: Continue statin   DISCHARGE CONDITIONS AND DIET:   Stable for discharge heart healthy diabetic diet  CONSULTS OBTAINED:    DRUG ALLERGIES:   Allergies  Allergen Reactions  . Metformin And Related Diarrhea    DISCHARGE MEDICATIONS:   Allergies as of 08/04/2018      Reactions   Metformin And Related Diarrhea      Medication List     TAKE these medications   acetaminophen 500 MG tablet Commonly known as:  TYLENOL Take 1,000 mg by mouth every 6 (six) hours as needed (for pain.).   alendronate 70 MG tablet Commonly known as:  FOSAMAX TAKE 1 TABLET BY MOUTH EVERY 7 DAYS. TAKE WITH A FULL GLASS OF WATER ON AN EMPTY STOMACH.   amLODipine 10 MG tablet Commonly known as:  NORVASC Take 1 tablet (10 mg total) by mouth daily.   aspirin 81 MG EC tablet Take 1 tablet (81 mg total) by mouth daily. What changed:  additional instructions   atorvastatin 40 MG tablet Commonly known as:  LIPITOR TAKE 1 TABLET BY MOUTH ONCE DAILY AT 6PM   blood glucose meter kit and supplies Kit Dispense based on patient and insurance preference. Use up to four times daily as directed. (FOR ICD-9 250.00, 250.01).   carvedilol 3.125 MG tablet Commonly known as:  COREG Take 1 tablet (3.125 mg total) by mouth 2 (two) times daily with a meal.   cephALEXin 250 MG capsule Commonly known as:  KEFLEX Take 1 capsule (250 mg total) by mouth every 6 (six) hours for 5 days.   clopidogrel 75 MG tablet Commonly known as:  Plavix Take 1 tablet (75 mg total) by mouth daily.   hydrochlorothiazide 12.5 MG tablet Commonly known as:  HYDRODIURIL Take 12.5 mg by mouth daily.   Lubricant Eye Drops 0.4-0.3 % Soln Generic drug:  Polyethyl Glycol-Propyl Glycol Place 1 drop into both eyes 3 (  three) times daily as needed (for dry eyes.).   metFORMIN 500 MG 24 hr tablet Commonly known as:  Glucophage XR Take 1 tablet (500 mg total) by mouth daily with breakfast.   multivitamin with minerals Tabs tablet Take 1 tablet by mouth daily. Centrum Silver . In the morning   solifenacin 5 MG tablet Commonly known as:  VESICARE Take 1 tablet (5 mg total) by mouth daily.   vitamin B-12 1000 MCG tablet Commonly known as:  CYANOCOBALAMIN Take 1,000 mcg by mouth daily.         Today   CHIEF COMPLAINT:  No acute issues overnight.   VITAL SIGNS:  Blood  pressure (!) 153/75, pulse (!) 101, temperature 98.9 F (37.2 C), temperature source Oral, resp. rate 18, height '5\' 3"'  (1.6 m), weight 68.9 kg, SpO2 93 %.   REVIEW OF SYSTEMS:  Review of Systems  Constitutional: Negative.  Negative for chills, fever and malaise/fatigue.  HENT: Negative.  Negative for ear discharge, ear pain, hearing loss, nosebleeds and sore throat.   Eyes: Negative.  Negative for blurred vision and pain.  Respiratory: Negative.  Negative for cough, hemoptysis, shortness of breath and wheezing.   Cardiovascular: Negative.  Negative for chest pain, palpitations and leg swelling.  Gastrointestinal: Negative.  Negative for abdominal pain, blood in stool, diarrhea, nausea and vomiting.  Genitourinary: Negative.  Negative for dysuria.  Musculoskeletal: Negative.  Negative for back pain.  Skin: Negative.   Neurological: Negative for dizziness, tremors, speech change, focal weakness, seizures and headaches.  Endo/Heme/Allergies: Negative.  Does not bruise/bleed easily.  Psychiatric/Behavioral: Negative.  Negative for depression, hallucinations and suicidal ideas.     PHYSICAL EXAMINATION:  GENERAL:  73 y.o.-year-old patient lying in the bed with no acute distress.  NECK:  Supple, no jugular venous distention. No thyroid enlargement, no tenderness.  LUNGS: Normal breath sounds bilaterally, no wheezing, rales,rhonchi  No use of accessory muscles of respiration.  CARDIOVASCULAR: S1, S2 normal. No murmurs, rubs, or gallops.  ABDOMEN: Soft, non-tender, non-distended. Bowel sounds present. No organomegaly or mass.  EXTREMITIES: No pedal edema, cyanosis, or clubbing.  PSYCHIATRIC: The patient is alert and oriented x 3.  SKIN: No obvious rash, lesion, or ulcer.   DATA REVIEW:   CBC Recent Labs  Lab 08/04/18 0330  WBC 19.9*  HGB 10.0*  HCT 28.8*  PLT 230    Chemistries  Recent Labs  Lab 08/01/18 1241 08/02/18 0422  NA 134* 135  K 5.1 3.9  CL 101 106  CO2 21* 19*   GLUCOSE 304* 183*  BUN 30* 26*  CREATININE 1.06* 1.12*  CALCIUM 9.2 8.3*  AST 27  --   ALT 27  --   ALKPHOS 62  --   BILITOT 0.7  --     Cardiac Enzymes Recent Labs  Lab 08/01/18 1241  TROPONINI <0.03    Microbiology Results  '@[id number]' @  RADIOLOGY:  No results found.    Allergies as of 08/04/2018      Reactions   Metformin And Related Diarrhea      Medication List    TAKE these medications   acetaminophen 500 MG tablet Commonly known as:  TYLENOL Take 1,000 mg by mouth every 6 (six) hours as needed (for pain.).   alendronate 70 MG tablet Commonly known as:  FOSAMAX TAKE 1 TABLET BY MOUTH EVERY 7 DAYS. TAKE WITH A FULL GLASS OF WATER ON AN EMPTY STOMACH.   amLODipine 10 MG tablet Commonly known as:  NORVASC Take 1  tablet (10 mg total) by mouth daily.   aspirin 81 MG EC tablet Take 1 tablet (81 mg total) by mouth daily. What changed:  additional instructions   atorvastatin 40 MG tablet Commonly known as:  LIPITOR TAKE 1 TABLET BY MOUTH ONCE DAILY AT 6PM   blood glucose meter kit and supplies Kit Dispense based on patient and insurance preference. Use up to four times daily as directed. (FOR ICD-9 250.00, 250.01).   carvedilol 3.125 MG tablet Commonly known as:  COREG Take 1 tablet (3.125 mg total) by mouth 2 (two) times daily with a meal.   cephALEXin 250 MG capsule Commonly known as:  KEFLEX Take 1 capsule (250 mg total) by mouth every 6 (six) hours for 5 days.   clopidogrel 75 MG tablet Commonly known as:  Plavix Take 1 tablet (75 mg total) by mouth daily.   hydrochlorothiazide 12.5 MG tablet Commonly known as:  HYDRODIURIL Take 12.5 mg by mouth daily.   Lubricant Eye Drops 0.4-0.3 % Soln Generic drug:  Polyethyl Glycol-Propyl Glycol Place 1 drop into both eyes 3 (three) times daily as needed (for dry eyes.).   metFORMIN 500 MG 24 hr tablet Commonly known as:  Glucophage XR Take 1 tablet (500 mg total) by mouth daily with breakfast.    multivitamin with minerals Tabs tablet Take 1 tablet by mouth daily. Centrum Silver . In the morning   solifenacin 5 MG tablet Commonly known as:  VESICARE Take 1 tablet (5 mg total) by mouth daily.   vitamin B-12 1000 MCG tablet Commonly known as:  CYANOCOBALAMIN Take 1,000 mcg by mouth daily.           Management plans discussed with the patient and she is in agreement. Stable for discharge   Patient should follow up with pcp  CODE STATUS:     Code Status Orders  (From admission, onward)         Start     Ordered   08/01/18 1539  Do not attempt resuscitation (DNR)  Continuous    Question Answer Comment  In the event of cardiac or respiratory ARREST Do not call a "code blue"   In the event of cardiac or respiratory ARREST Do not perform Intubation, CPR, defibrillation or ACLS   In the event of cardiac or respiratory ARREST Use medication by any route, position, wound care, and other measures to relive pain and suffering. May use oxygen, suction and manual treatment of airway obstruction as needed for comfort.      08/01/18 1538        Code Status History    Date Active Date Inactive Code Status Order ID Comments User Context   04/22/2018 1343 04/22/2018 2321 Full Code [id number]  Katha Cabal, MD Inpatient   12/31/2017 1051 12/31/2017 1547 DNR [id number]  Katha Cabal, MD Inpatient   10/01/2016 1645 10/02/2016 2117 DNR [id number]  Loletha Grayer, MD ED    Advance Directive Documentation     Most Recent Value  Type of Advance Directive  Out of facility DNR (pink MOST or yellow form)  Pre-existing out of facility DNR order (yellow form or pink MOST form)  Yellow form placed in chart (order not valid for inpatient use)  "MOST" Form in Place?  -      TOTAL TIME TAKING CARE OF THIS PATIENT: 38 minutes.    Note: This dictation was prepared with Dragon dictation along with smaller phrase technology. Any transcriptional errors that result from this process  are unintentional.  Urania Pearlman M.D on 08/04/2018 at 11:21 AM  Between 7am to 6pm - Pager - [phone] After 6pm go to [email or web address] - password [email or web address] [place] [facility] [place] [facility]  Office  [phone]  CC: Primary care physician; [name], MD

## 2018-08-04 NOTE — Progress Notes (Signed)
Patient's daughter and son upset about the discharge.  Care manager and Dr Juliene Pina helped talk with the patient and the family.  Options were presented and the patient/family decided to go home

## 2018-08-04 NOTE — TOC Transition Note (Addendum)
Transition of Care [facility]) - CM/SW Discharge Note   Patient Details  Name: Crystal Miller MRN: [id number] Date of Birth: 1945/09/08  [facility] CM/SW Contact:  York Spaniel, LCSW Phone Number: 08/04/2018, 12:10 PM   Clinical Narrative:   Patient discharging today. Patient was able to ambulate 40 feet with rolling walker with PT. Patient has medicare [employer]. CSW explained to both patient (at bedside) and daughter( on phone) that medicare Monia Pouch is very strict on who they approve for SNF and that their prior authorization takes 3-4 days. CSW explained that the hospital level of care was no longer needed. CSW discussed the option of returning home with home health and patient stated she was agreeable to this. CSW provided home health list to patient and she informed CSW that she had used [facility] in the past. Patient stated she wanted to use them again. CSW gave referral to [facility] with [facility] who stated that he would need to get approval from his leadership.  Later, Barbara Cower with [facility] stated that they can take the referral. Physician ordered for a walker but patient has a rollator at home and a 3 in 1 commode.     Final next level of care: Home w Home Health Services Barriers to Discharge: No Barriers Identified   Patient Goals and CMS Choice Patient states their goals for this hospitalization and ongoing recovery are:: to improve her ambulation CMS Medicare.gov Compare Post Acute Care list provided to:: Patient Choice offered to / list presented to : Patient  Discharge Placement                  Name of family member notified: ([name]) Patient and family notified of of transfer: 08/04/18  Discharge Plan and Services                        Social Determinants of Health (SDOH) Interventions     Readmission Risk Interventions Readmission Risk Prevention Plan 08/04/2018  Post Dischage Appt Complete  Medication Screening Complete   Transportation Screening Complete  Some recent data might be hidden

## 2018-08-04 NOTE — Consult Note (Signed)
Pharmacy Antibiotic Note  Crystal Miller is a 73 y.o. female admitted on 08/01/2018 with UTI.  Pharmacy has been consulted for Cephalexin dosing.  Plan: Pt is on day 4 of Ceftriaxone therapy and pharmacy has been consulted for a transition to Cephalexin in anticipation of discharge.  Will dose Cephalexin 250mg  qid for 3 more days per pt current renal function and a total of 5 days antibiotic therapy for UTI.  Height: 5\' 3"  (160 cm) Weight: 152 lb (68.9 kg) IBW/kg (Calculated) : 52.4  Temp (24hrs), Avg:99 F (37.2 C), Min:98.6 F (37 C), Max:99.4 F (37.4 C)  Recent Labs  Lab 08/01/18 1144 08/01/18 1241 08/01/18 1332 08/01/18 1656 08/02/18 0422 08/03/18 0406 08/04/18 0330  WBC 26.8*  --   --   --  29.9* 24.5* 19.9*  CREATININE  --  1.06*  --   --  1.12*  --   --   LATICACIDVEN  --   --  2.2* 2.3*  --   --   --     Estimated Creatinine Clearance: 41 mL/min (A) (by C-G formula based on SCr of 1.12 mg/dL (H)).    Allergies  Allergen Reactions  . Metformin And Related Diarrhea    Antimicrobials this admission: 3/13 Ceftriaxone >> 03/16 3/16 Cephalexin >>   Dose adjustments this admission: None  Microbiology results: 3/16 BCx: NG x 3 days 3/16 UCx: cefazolin sensitive E Coli   03/13 MRSA PCR: Positive  Thank you for allowing pharmacy to be a part of this patient's care.  Albina Billet, PharmD, BCPS Clinical Pharmacist 08/04/2018 8:40 AM

## 2018-08-05 ENCOUNTER — Telehealth: Payer: Self-pay | Admitting: Family Medicine

## 2018-08-05 NOTE — Telephone Encounter (Signed)
Transition Care Management Follow-up Telephone Call  How have you been since you were released from the hospital? Patient had good day today.   Do you understand why you were in the hospital? yes   Do you understand the discharge instrcutions? yes  Items Reviewed:  Medications reviewed: yes  Allergies reviewed: yes  Dietary changes reviewed: yes  Referrals reviewed: yes   Functional Questionnaire:   Activities of Daily Living (ADLs):   She states they are independent in the following: bathing and hygiene, feeding, continence, grooming and toileting States they require assistance with the following: ambulation and dressing   Any transportation issues/concerns?: no   Any patient concerns? no   Confirmed importance and date/time of follow-up visits scheduled: yes   Confirmed with patient if condition begins to worsen call PCP or go to the ER.  Patient was given the Call-a-Nurse line [phone]: yes

## 2018-08-06 LAB — CULTURE, BLOOD (ROUTINE X 2)
Culture: NO GROWTH
Culture: NO GROWTH
Special Requests: ADEQUATE

## 2018-08-07 ENCOUNTER — Other Ambulatory Visit: Payer: Self-pay

## 2018-08-07 ENCOUNTER — Encounter: Payer: Self-pay | Admitting: Family Medicine

## 2018-08-07 ENCOUNTER — Ambulatory Visit (INDEPENDENT_AMBULATORY_CARE_PROVIDER_SITE_OTHER): Payer: Medicare HMO | Admitting: Family Medicine

## 2018-08-07 VITALS — BP 118/60 | HR 83 | Temp 98.1°F | Ht 63.0 in | Wt 153.8 lb

## 2018-08-07 DIAGNOSIS — N12 Tubulo-interstitial nephritis, not specified as acute or chronic: Secondary | ICD-10-CM | POA: Diagnosis not present

## 2018-08-07 DIAGNOSIS — I10 Essential (primary) hypertension: Secondary | ICD-10-CM

## 2018-08-07 DIAGNOSIS — N3281 Overactive bladder: Secondary | ICD-10-CM | POA: Diagnosis not present

## 2018-08-07 DIAGNOSIS — F330 Major depressive disorder, recurrent, mild: Secondary | ICD-10-CM

## 2018-08-07 DIAGNOSIS — N10 Acute tubulo-interstitial nephritis: Secondary | ICD-10-CM | POA: Diagnosis not present

## 2018-08-07 DIAGNOSIS — R69 Illness, unspecified: Secondary | ICD-10-CM | POA: Diagnosis not present

## 2018-08-07 LAB — POCT URINALYSIS DIPSTICK
BILIRUBIN UA: NEGATIVE
Blood, UA: NEGATIVE
Glucose, UA: NEGATIVE
Ketones, UA: NEGATIVE
Leukocytes, UA: NEGATIVE
Nitrite, UA: NEGATIVE
Protein, UA: POSITIVE — AB
Spec Grav, UA: 1.02 (ref 1.010–1.025)
Urobilinogen, UA: 0.2 E.U./dL
pH, UA: 5.5 (ref 5.0–8.0)

## 2018-08-07 LAB — CBC WITH DIFFERENTIAL/PLATELET
Basophils Absolute: 0.1 10*3/uL (ref 0.0–0.1)
Basophils Relative: 0.4 % (ref 0.0–3.0)
Eosinophils Absolute: 0.1 10*3/uL (ref 0.0–0.7)
Eosinophils Relative: 0.7 % (ref 0.0–5.0)
HCT: 30.6 % — ABNORMAL LOW (ref 36.0–46.0)
Hemoglobin: 10.3 g/dL — ABNORMAL LOW (ref 12.0–15.0)
Lymphocytes Relative: 8 % — ABNORMAL LOW (ref 12.0–46.0)
Lymphs Abs: 1.1 10*3/uL (ref 0.7–4.0)
MCHC: 33.5 g/dL (ref 30.0–36.0)
MCV: 88.5 fl (ref 78.0–100.0)
MONOS PCT: 10.9 % (ref 3.0–12.0)
Monocytes Absolute: 1.5 10*3/uL — ABNORMAL HIGH (ref 0.1–1.0)
NEUTROS ABS: 11 10*3/uL — AB (ref 1.4–7.7)
Neutrophils Relative %: 80 % — ABNORMAL HIGH (ref 43.0–77.0)
Platelets: 367 10*3/uL (ref 150.0–400.0)
RBC: 3.46 Mil/uL — ABNORMAL LOW (ref 3.87–5.11)
RDW: 13 % (ref 11.5–15.5)
WBC: 13.8 10*3/uL — ABNORMAL HIGH (ref 4.0–10.5)

## 2018-08-07 LAB — BASIC METABOLIC PANEL
BUN: 18 mg/dL (ref 6–23)
CHLORIDE: 96 meq/L (ref 96–112)
CO2: 24 mEq/L (ref 19–32)
Calcium: 8.9 mg/dL (ref 8.4–10.5)
Creatinine, Ser: 1.08 mg/dL (ref 0.40–1.20)
GFR: 60 mL/min — ABNORMAL LOW (ref >60.00)
Glucose, Bld: 278 mg/dL — ABNORMAL HIGH (ref 70–99)
Potassium: 4 mEq/L (ref 3.5–5.1)
Sodium: 131 mEq/L — ABNORMAL LOW (ref 135–145)

## 2018-08-07 MED ORDER — SERTRALINE HCL 50 MG PO TABS: 25.0000 mg | ORAL_TABLET | Freq: Every day | ORAL | 3 refills | Status: DC

## 2018-08-07 NOTE — Patient Instructions (Addendum)
Nice to see you. We will check your urine and lab work today.  We will contact you with the results. Please restart your Zoloft.  If your depression worsens or you develop thoughts of harming yourself please seek medical attention immediately.

## 2018-08-07 NOTE — Progress Notes (Signed)
Marikay Alar, MD Phone: [phone]  Crystal Miller is a 73 y.o. female who presents today for hospital follow-up.  Patient was hospitalized from 08/01/2018- 08/04/2018 for abdominal pain and pyelonephritis.  The patient noted that she developed right-sided abdominal pain and right flank pain.  She initially felt as though this would resolve with a bowel movement though when it did not and it became severe she went to the emergency department for evaluation.  CT imaging was consistent with pyelonephritis of the right kidney.  Her urine culture grew E. coli.  She was on IV antibiotics and transitioned to Keflex.  She is still on the Keflex.  She notes her abdominal discomfort has improved significantly.  She still has minimal discomfort.  She notes no dysuria or frequency of urination.  She notes her urinary urgency has improved significantly with timed voiding.  She has had no fevers.  No hematuria.  She was unable to afford the Vesicare.  She has been urinating every 2 hours with timed voiding and notes her prior overactive bladder urinary symptoms have improved.  Her blood pressure does seem to run elevated at home.  She reports she was restarted on hydrochlorothiazide at the hospital though this does not appear to be reflected in the hospital course in the discharge summary.  She has not been taking this since discharge home.  She is on amlodipine and carvedilol for her blood pressure.  She denies chest pain or shortness of breath.  She does note some depression.  She notes this mostly bothers her when she is home all day.  She notes she stopped Zoloft about a month ago and noticed a difference with that discontinuation.  She would like to restart this.  She notes no SI.  She does note that she is supposed to be doing home health PT though they are unsure of when this will start with the current coronavirus outbreak.  Discharge summary reviewed.  Medications reviewed.  Social History   Tobacco Use   Smoking Status Former Smoker  . Packs/day: 1.00  . Years: 20.00  . Pack years: 20.00  . Types: Cigarettes  . Last attempt to quit: 2012  . Years since quitting: 8.2  Smokeless Tobacco Never Used     ROS see history of present illness  Objective  Physical Exam Vitals:   08/07/18 1041  BP: 118/60  Pulse: 83  Temp: 98.1 F (36.7 C)  SpO2: 97%    BP Readings from Last 3 Encounters:  08/07/18 118/60  08/04/18 (!) 153/75  06/30/18 (!) 156/78   Wt Readings from Last 3 Encounters:  08/07/18 153 lb 12.8 oz (69.8 kg)  08/01/18 152 lb (68.9 kg)  06/30/18 150 lb 1.6 oz (68.1 kg)    Physical Exam Constitutional:      General: She is not in acute distress.    Appearance: She is not diaphoretic.  Cardiovascular:     Rate and Rhythm: Normal rate and regular rhythm.     Heart sounds: Normal heart sounds.  Pulmonary:     Effort: Pulmonary effort is normal.     Breath sounds: Normal breath sounds.  Abdominal:     General: Bowel sounds are normal. There is no distension.     Palpations: Abdomen is soft.     Tenderness: There is no abdominal tenderness. There is no guarding or rebound.  Musculoskeletal:     Right lower leg: No edema.     Left lower leg: No edema.  Skin:  General: Skin is warm and dry.  Neurological:     Mental Status: She is alert.      Assessment/Plan: Please see individual problem list.  Acute pyelonephritis Patient has continued to improve since discharge from the hospital.  We will recheck her urine.  She will complete her course of Keflex.  She will monitor for work of symptoms and if they recur she will be reevaluated.  She is given return precautions.  Recheck blood work as well.  Essential hypertension Above goal frequently at home.  We will check her renal function and then determine the next step in management of her blood pressure.  Overactive bladder Symptoms have improved significantly with timed voiding.  She will continue this given  she has control of her symptoms.  Depression Patient discontinued her Zoloft.  She has had recurrence of this issue.  We will restart Zoloft.   Orders Placed This Encounter  Procedures  . CBC w/Diff  . Basic Metabolic Panel (BMET)  . POCT Urinalysis Dipstick    Meds ordered this encounter  Medications  . sertraline (ZOLOFT) 50 MG tablet    Sig: Take 0.5 tablets (25 mg total) by mouth daily.    Dispense:  45 tablet    Refill:  3     Marikay Alar, MD [facility]

## 2018-08-08 ENCOUNTER — Telehealth: Payer: Self-pay | Admitting: Pharmacist

## 2018-08-08 DIAGNOSIS — N10 Acute tubulo-interstitial nephritis: Secondary | ICD-10-CM | POA: Diagnosis not present

## 2018-08-08 DIAGNOSIS — Z7982 Long term (current) use of aspirin: Secondary | ICD-10-CM | POA: Diagnosis not present

## 2018-08-08 DIAGNOSIS — I10 Essential (primary) hypertension: Secondary | ICD-10-CM | POA: Diagnosis not present

## 2018-08-08 DIAGNOSIS — I69328 Other speech and language deficits following cerebral infarction: Secondary | ICD-10-CM | POA: Diagnosis not present

## 2018-08-08 DIAGNOSIS — E1151 Type 2 diabetes mellitus with diabetic peripheral angiopathy without gangrene: Secondary | ICD-10-CM | POA: Diagnosis not present

## 2018-08-08 DIAGNOSIS — I69354 Hemiplegia and hemiparesis following cerebral infarction affecting left non-dominant side: Secondary | ICD-10-CM | POA: Diagnosis not present

## 2018-08-08 DIAGNOSIS — B9620 Unspecified Escherichia coli [E. coli] as the cause of diseases classified elsewhere: Secondary | ICD-10-CM | POA: Diagnosis not present

## 2018-08-08 DIAGNOSIS — R32 Unspecified urinary incontinence: Secondary | ICD-10-CM | POA: Diagnosis not present

## 2018-08-08 DIAGNOSIS — E782 Mixed hyperlipidemia: Secondary | ICD-10-CM | POA: Diagnosis not present

## 2018-08-08 DIAGNOSIS — K219 Gastro-esophageal reflux disease without esophagitis: Secondary | ICD-10-CM | POA: Diagnosis not present

## 2018-08-08 NOTE — Assessment & Plan Note (Signed)
Patient discontinued her Zoloft.  She has had recurrence of this issue.  We will restart Zoloft.

## 2018-08-08 NOTE — Assessment & Plan Note (Signed)
Symptoms have improved significantly with timed voiding.  She will continue this given she has control of her symptoms.

## 2018-08-08 NOTE — Assessment & Plan Note (Signed)
Above goal frequently at home.  We will check her renal function and then determine the next step in management of her blood pressure.

## 2018-08-08 NOTE — Telephone Encounter (Signed)
In reviewing my schedule for Monday, I saw that this patient was going to see me for hypertension follow up, though, BP was well controlled at the office yesterday, though elevated by report at home.   Contacted patient's daughter; reviewed lab work results with her. She confirms that her mother saw nephrology at [facility], believes that she saw Dr. Thedore Mins. Verbalizes understanding and agreement with repeat lab work next week, and is agreeable to increasing carvedilol dose to 6.25 mg BID. Would prefer to cancel appointment with me on Monday, due to current coronavirus concerns.

## 2018-08-08 NOTE — Assessment & Plan Note (Signed)
Patient has continued to improve since discharge from the hospital.  We will recheck her urine.  She will complete her course of Keflex.  She will monitor for work of symptoms and if they recur she will be reevaluated.  She is given return precautions.  Recheck blood work as well.

## 2018-08-08 NOTE — Telephone Encounter (Signed)
I agree.  Thank you for reaching out to them.

## 2018-08-10 ENCOUNTER — Other Ambulatory Visit: Payer: Self-pay | Admitting: Family Medicine

## 2018-08-11 ENCOUNTER — Ambulatory Visit: Payer: Medicare HMO | Admitting: Pharmacist

## 2018-08-11 DIAGNOSIS — B9620 Unspecified Escherichia coli [E. coli] as the cause of diseases classified elsewhere: Secondary | ICD-10-CM | POA: Diagnosis not present

## 2018-08-11 DIAGNOSIS — Z7982 Long term (current) use of aspirin: Secondary | ICD-10-CM | POA: Diagnosis not present

## 2018-08-11 DIAGNOSIS — R32 Unspecified urinary incontinence: Secondary | ICD-10-CM | POA: Diagnosis not present

## 2018-08-11 DIAGNOSIS — I69354 Hemiplegia and hemiparesis following cerebral infarction affecting left non-dominant side: Secondary | ICD-10-CM | POA: Diagnosis not present

## 2018-08-11 DIAGNOSIS — E1151 Type 2 diabetes mellitus with diabetic peripheral angiopathy without gangrene: Secondary | ICD-10-CM | POA: Diagnosis not present

## 2018-08-11 DIAGNOSIS — N10 Acute tubulo-interstitial nephritis: Secondary | ICD-10-CM | POA: Diagnosis not present

## 2018-08-11 DIAGNOSIS — K219 Gastro-esophageal reflux disease without esophagitis: Secondary | ICD-10-CM | POA: Diagnosis not present

## 2018-08-11 DIAGNOSIS — I10 Essential (primary) hypertension: Secondary | ICD-10-CM | POA: Diagnosis not present

## 2018-08-11 DIAGNOSIS — I69328 Other speech and language deficits following cerebral infarction: Secondary | ICD-10-CM | POA: Diagnosis not present

## 2018-08-11 DIAGNOSIS — E782 Mixed hyperlipidemia: Secondary | ICD-10-CM | POA: Diagnosis not present

## 2018-08-12 MED ORDER — CARVEDILOL 6.25 MG PO TABS: 6.2500 mg | ORAL_TABLET | Freq: Two times a day (BID) | ORAL | 3 refills | Status: DC

## 2018-08-12 NOTE — Telephone Encounter (Signed)
Spoke with pt who states rx was sent in yesterday that was not for the increase that Dr. Birdie Sons is requesting. Informed her to tell her daughter to not pick up that rx that office will contact her once new rx was sent with the correct increase.

## 2018-08-12 NOTE — Telephone Encounter (Signed)
Called and spoke with pt's daughter. Daughter advised she already picked up the original prescription. I advised pt's daughter that she can use up what she has picked up and just have her mother take 2 of the 3.125 twice daily ( 4 tablets daily) and once done pick up the prescription for the 6.25 Mg and she will then do 1 tab;et twice daily.

## 2018-08-12 NOTE — Telephone Encounter (Signed)
Sent to pharmacy 

## 2018-08-12 NOTE — Telephone Encounter (Signed)
Sen to PCP to send in increased dose for carvedilol INCREASED dose has NOT been sent in yet. Call Daughter once done.

## 2018-08-12 NOTE — Addendum Note (Signed)
Addended by: Glori Luis on: 08/12/2018 03:07 PM   Modules accepted: Orders

## 2018-08-13 DIAGNOSIS — R32 Unspecified urinary incontinence: Secondary | ICD-10-CM | POA: Diagnosis not present

## 2018-08-13 DIAGNOSIS — K219 Gastro-esophageal reflux disease without esophagitis: Secondary | ICD-10-CM | POA: Diagnosis not present

## 2018-08-13 DIAGNOSIS — Z7982 Long term (current) use of aspirin: Secondary | ICD-10-CM | POA: Diagnosis not present

## 2018-08-13 DIAGNOSIS — B9620 Unspecified Escherichia coli [E. coli] as the cause of diseases classified elsewhere: Secondary | ICD-10-CM | POA: Diagnosis not present

## 2018-08-13 DIAGNOSIS — E782 Mixed hyperlipidemia: Secondary | ICD-10-CM | POA: Diagnosis not present

## 2018-08-13 DIAGNOSIS — I69354 Hemiplegia and hemiparesis following cerebral infarction affecting left non-dominant side: Secondary | ICD-10-CM | POA: Diagnosis not present

## 2018-08-13 DIAGNOSIS — I10 Essential (primary) hypertension: Secondary | ICD-10-CM | POA: Diagnosis not present

## 2018-08-13 DIAGNOSIS — E1151 Type 2 diabetes mellitus with diabetic peripheral angiopathy without gangrene: Secondary | ICD-10-CM | POA: Diagnosis not present

## 2018-08-13 DIAGNOSIS — I69328 Other speech and language deficits following cerebral infarction: Secondary | ICD-10-CM | POA: Diagnosis not present

## 2018-08-13 DIAGNOSIS — N10 Acute tubulo-interstitial nephritis: Secondary | ICD-10-CM | POA: Diagnosis not present

## 2018-08-14 ENCOUNTER — Telehealth: Payer: Self-pay | Admitting: Radiology

## 2018-08-14 ENCOUNTER — Telehealth: Payer: Self-pay | Admitting: Family Medicine

## 2018-08-14 DIAGNOSIS — D72829 Elevated white blood cell count, unspecified: Secondary | ICD-10-CM

## 2018-08-14 NOTE — Addendum Note (Signed)
Addended by: Glori Luis on: 08/14/2018 08:41 AM   Modules accepted: Orders

## 2018-08-14 NOTE — Telephone Encounter (Signed)
Pt coming in for labs Monday, please place future orders. Thank you 

## 2018-08-14 NOTE — Telephone Encounter (Signed)
Copied from CRM [id number]. Topic: Quick Communication - Home Health Verbal Orders >> Aug 14, 2018  8:46 AM Lorayne Bender wrote: Caller/Agency: Elmer Bales with [facility] Callback Number: [phone] Requesting OT/PT/Skilled Nursing/Social Work/Speech Therapy: PT Frequency: 2x a week for 3 weeks, 1x a week for 1 week

## 2018-08-14 NOTE — Telephone Encounter (Signed)
Called and left vm for Elmer Bales w/ [facility].  Gave verbal orders for PT as requested.

## 2018-08-15 DIAGNOSIS — I69354 Hemiplegia and hemiparesis following cerebral infarction affecting left non-dominant side: Secondary | ICD-10-CM | POA: Diagnosis not present

## 2018-08-15 DIAGNOSIS — K219 Gastro-esophageal reflux disease without esophagitis: Secondary | ICD-10-CM | POA: Diagnosis not present

## 2018-08-15 DIAGNOSIS — E1151 Type 2 diabetes mellitus with diabetic peripheral angiopathy without gangrene: Secondary | ICD-10-CM | POA: Diagnosis not present

## 2018-08-15 DIAGNOSIS — I69328 Other speech and language deficits following cerebral infarction: Secondary | ICD-10-CM | POA: Diagnosis not present

## 2018-08-15 DIAGNOSIS — N10 Acute tubulo-interstitial nephritis: Secondary | ICD-10-CM | POA: Diagnosis not present

## 2018-08-15 DIAGNOSIS — E782 Mixed hyperlipidemia: Secondary | ICD-10-CM | POA: Diagnosis not present

## 2018-08-15 DIAGNOSIS — Z7982 Long term (current) use of aspirin: Secondary | ICD-10-CM | POA: Diagnosis not present

## 2018-08-15 DIAGNOSIS — R32 Unspecified urinary incontinence: Secondary | ICD-10-CM | POA: Diagnosis not present

## 2018-08-15 DIAGNOSIS — B9620 Unspecified Escherichia coli [E. coli] as the cause of diseases classified elsewhere: Secondary | ICD-10-CM | POA: Diagnosis not present

## 2018-08-15 DIAGNOSIS — I10 Essential (primary) hypertension: Secondary | ICD-10-CM | POA: Diagnosis not present

## 2018-08-18 ENCOUNTER — Ambulatory Visit (INDEPENDENT_AMBULATORY_CARE_PROVIDER_SITE_OTHER): Payer: Medicare HMO | Admitting: Vascular Surgery

## 2018-08-18 ENCOUNTER — Encounter (INDEPENDENT_AMBULATORY_CARE_PROVIDER_SITE_OTHER): Payer: Self-pay | Admitting: Vascular Surgery

## 2018-08-18 ENCOUNTER — Other Ambulatory Visit: Payer: Self-pay

## 2018-08-18 ENCOUNTER — Ambulatory Visit (INDEPENDENT_AMBULATORY_CARE_PROVIDER_SITE_OTHER): Payer: Medicare HMO

## 2018-08-18 ENCOUNTER — Other Ambulatory Visit (INDEPENDENT_AMBULATORY_CARE_PROVIDER_SITE_OTHER): Payer: Medicare HMO

## 2018-08-18 VITALS — BP 145/78 | HR 66 | Resp 19 | Ht 63.0 in | Wt 142.0 lb

## 2018-08-18 DIAGNOSIS — Z7902 Long term (current) use of antithrombotics/antiplatelets: Secondary | ICD-10-CM

## 2018-08-18 DIAGNOSIS — I10 Essential (primary) hypertension: Secondary | ICD-10-CM

## 2018-08-18 DIAGNOSIS — I739 Peripheral vascular disease, unspecified: Secondary | ICD-10-CM

## 2018-08-18 DIAGNOSIS — Z79899 Other long term (current) drug therapy: Secondary | ICD-10-CM

## 2018-08-18 DIAGNOSIS — E118 Type 2 diabetes mellitus with unspecified complications: Secondary | ICD-10-CM

## 2018-08-18 DIAGNOSIS — D72829 Elevated white blood cell count, unspecified: Secondary | ICD-10-CM

## 2018-08-18 DIAGNOSIS — E782 Mixed hyperlipidemia: Secondary | ICD-10-CM

## 2018-08-18 DIAGNOSIS — Z87891 Personal history of nicotine dependence: Secondary | ICD-10-CM | POA: Diagnosis not present

## 2018-08-18 DIAGNOSIS — Z9862 Peripheral vascular angioplasty status: Secondary | ICD-10-CM | POA: Diagnosis not present

## 2018-08-18 LAB — CBC WITH DIFFERENTIAL/PLATELET
BASOS PCT: 1.2 % (ref 0.0–3.0)
Basophils Absolute: 0.1 10*3/uL (ref 0.0–0.1)
Eosinophils Absolute: 0.1 10*3/uL (ref 0.0–0.7)
Eosinophils Relative: 1 % (ref 0.0–5.0)
HCT: 33.2 % — ABNORMAL LOW (ref 36.0–46.0)
Hemoglobin: 11.3 g/dL — ABNORMAL LOW (ref 12.0–15.0)
Lymphocytes Relative: 16.4 % (ref 12.0–46.0)
Lymphs Abs: 1.4 10*3/uL (ref 0.7–4.0)
MCHC: 34.2 g/dL (ref 30.0–36.0)
MCV: 89 fl (ref 78.0–100.0)
MONOS PCT: 6.1 % (ref 3.0–12.0)
Monocytes Absolute: 0.5 10*3/uL (ref 0.1–1.0)
Neutro Abs: 6.4 10*3/uL (ref 1.4–7.7)
Neutrophils Relative %: 75.3 % (ref 43.0–77.0)
Platelets: 664 10*3/uL — ABNORMAL HIGH (ref 150.0–400.0)
RBC: 3.73 Mil/uL — ABNORMAL LOW (ref 3.87–5.11)
RDW: 13.3 % (ref 11.5–15.5)
WBC: 8.5 10*3/uL (ref 4.0–10.5)

## 2018-08-18 NOTE — Progress Notes (Signed)
MRN : [id number]  Crystal Miller is a 73 y.o. ([birth year]) female who presents with chief complaint of  Chief Complaint  Patient presents with   Follow-up  .  History of Present Illness:   The patient returns to the office for followup and review status post angiogram with intervention on 04/22/2018.   1.  Percutaneous transluminal angioplastyrightsuperficial femoral and popliteal arteries to 5 mm distally and 6 mm at the origin of the SFA 2.  Percutaneous transluminal angioplastyright posterior tibial to 2 mm in its proximal two thirds and then 3 mm from the origin into the tibioperoneal trunk 3.  Percutaneous transluminal angioplasty of the right profunda femoris to 4 mm proximally  The patient notes improvement in the lower extremity symptoms. No interval shortening of the patient's claudication distance or rest pain symptoms. Previous wounds have now healed.  No new ulcers or wounds have occurred since the last visit.  There have been no significant changes to the patient's overall health care.  The patient denies amaurosis fugax or recent TIA symptoms. There are no recent neurological changes noted. The patient denies history of DVT, PE or superficial thrombophlebitis. The patient denies recent episodes of angina or shortness of breath.   ABI's Rt=1.14 and Lt=1.08  (previous ABI's Rt=0.99 and Lt=1.02)  No outpatient medications have been marked as taking for the 08/18/18 encounter (Office Visit) with Renford Dills, MD.    Past Medical History:  Diagnosis Date   Depression    husband died in Oct 01, 2015   DM (diabetes mellitus), 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   Essential hypertension 11/01/2016   GERD (gastroesophageal reflux disease)    Mixed hyperlipidemia 11/01/2016   Peripheral vascular disease (HCC)    Renal insufficiency    Stroke (HCC) 09-30-16   slight residual with speech. does not drive Lt side weakness    Past Surgical History:  Procedure  Laterality Date   CHOLECYSTECTOMY     LOWER EXTREMITY ANGIOGRAPHY Left 12/31/2017   Procedure: LOWER EXTREMITY ANGIOGRAPHY;  Surgeon: Renford Dills, MD;  Location: [facility] INVASIVE CV [facility];  Service: Cardiovascular;  Laterality: Left;   LOWER EXTREMITY ANGIOGRAPHY Right 04/22/2018   Procedure: LOWER EXTREMITY ANGIOGRAPHY;  Surgeon: Renford Dills, MD;  Location: [facility] INVASIVE CV LAB;  Service: Cardiovascular;  Laterality: Right;    Social History Social History   Tobacco Use   Smoking status: Former Smoker    Packs/day: 1.00    Years: 20.00    Pack years: 20.00    Types: Cigarettes    Last attempt to quit: Oct 01, 2010    Years since quitting: 8.2   Smokeless tobacco: Never Used  Substance Use Topics   Alcohol use: No   Drug use: No    Family History Family History  Problem Relation Age of Onset   Alzheimer's disease Mother    Arthritis Mother    Hypertension Mother    CVA Father    CAD Father    Heart disease Father    Stroke Father    Hypertension Father    Stroke Brother    Breast cancer Neg Hx     Allergies  Allergen Reactions   Metformin And Related Diarrhea     REVIEW OF SYSTEMS (Negative unless checked)  Constitutional: [] Weight loss  [] Fever  [] Chills Cardiac: [] Chest pain   [] Chest pressure   [] Palpitations   [] Shortness of breath when laying flat   [] Shortness of breath with exertion. Vascular:  [x] Pain in legs with walking   []   Pain in legs at rest  [] History of DVT   [] Phlebitis   [] Swelling in legs   [] Varicose veins   [] Non-healing ulcers Pulmonary:   [] Uses home oxygen   [] Productive cough   [] Hemoptysis   [] Wheeze  [] COPD   [] Asthma Neurologic:  [] Dizziness   [] Seizures   [] History of stroke   [] History of TIA  [] Aphasia   [] Vissual changes   [] Weakness or numbness in arm   [] Weakness or numbness in leg Musculoskeletal:   [] Joint swelling   [x] Joint pain   [] Low back pain Hematologic:  [] Easy bruising  [] Easy bleeding    [] Hypercoagulable state   [] Anemic Gastrointestinal:  [] Diarrhea   [] Vomiting  [] Gastroesophageal reflux/heartburn   [] Difficulty swallowing. Genitourinary:  [] Chronic kidney disease   [] Difficult urination  [] Frequent urination   [] Blood in urine Skin:  [] Rashes   [] Ulcers  Psychological:  [] History of anxiety   []  History of major depression.  Physical Examination  Vitals:   08/18/18 1444  BP: (!) 145/78  Pulse: 66  Resp: 19  Weight: 142 lb (64.4 kg)  Height: 5\' 3"  (1.6 m)   Body mass index is 25.15 kg/m. Gen: WD/WN, NAD Head: [place]/AT, No temporalis wasting.  Ear/Nose/Throat: Hearing grossly intact, nares w/o erythema or drainage Eyes: PER, EOMI, sclera nonicteric.  Neck: Supple, no large masses.   Pulmonary:  Good air movement, no audible wheezing bilaterally, no use of accessory muscles.  Cardiac: RRR, no JVD Vascular:  Vessel Right Left  Radial Palpable Palpable  PT 2+ Palpable 2+ Palpable  DP Trace Palpable Trace Palpable  Gastrointestinal: Non-distended. No guarding/no peritoneal signs.  Musculoskeletal: M/S 5/5 throughout.  No deformity or atrophy.  Neurologic: CN 2-12 intact. Symmetrical.  Speech is fluent. Motor exam as listed above. Psychiatric: Judgment intact, Mood & affect appropriate for pt's clinical situation. Dermatologic: No rashes or ulcers noted.  No changes consistent with cellulitis. Lymph : No lichenification or skin changes of chronic lymphedema.  CBC Lab Results  Component Value Date   WBC 8.5 08/18/2018   HGB 11.3 (L) 08/18/2018   HCT 33.2 (L) 08/18/2018   MCV 89.0 08/18/2018   PLT 664.0 (H) 08/18/2018    BMET    Component Value Date/Time   NA 131 (L) 08/07/2018 1105   K 4.0 08/07/2018 1105   CL 96 08/07/2018 1105   CO2 24 08/07/2018 1105   GLUCOSE 278 (H) 08/07/2018 1105   BUN 18 08/07/2018 1105   CREATININE 1.08 08/07/2018 1105   CALCIUM 8.9 08/07/2018 1105   GFRNONAA 48 (L) 08/02/2018 0422   GFRAA 56 (L) 08/02/2018 0422    Estimated Creatinine Clearance: 41.3 mL/min (by C-G formula based on SCr of 1.08 mg/dL).  COAG No results found for: INR, PROTIME  Radiology Ct Renal Stone Study  Result Date: 08/01/2018 CLINICAL DATA:  73 year old female with acute RIGHT abdominal and flank pain. EXAM: CT ABDOMEN AND PELVIS WITHOUT CONTRAST TECHNIQUE: Multidetector CT imaging of the abdomen and pelvis was performed following the standard protocol without IV contrast. COMPARISON:  None. FINDINGS: Please note that parenchymal abnormalities may be missed without intravenous contrast. Lower chest: No acute abnormality. Hepatobiliary: The liver is unremarkable. Patient is status post cholecystectomy. No biliary dilatation. Pancreas: Unremarkable Spleen: Unremarkable Adrenals/Urinary Tract: RIGHT perinephric inflammation is identified with fullness of the RIGHT intrarenal collecting system. No obstructing urinary calculi noted. The LEFT kidney, adrenal glands and bladder are unremarkable. Stomach/Bowel: Stomach is within normal limits. No evidence of bowel wall thickening, distention, or inflammatory changes. Vascular/Lymphatic:  Aortic atherosclerosis. Bilateral iliac stents noted. No enlarged abdominal or pelvic lymph nodes. Reproductive: Uterus and bilateral adnexa are unremarkable. Other: No ascites, focal collection or pneumoperitoneum. Musculoskeletal: No acute or suspicious bony abnormalities. Degenerative disc disease/spondylosis at L5-S1 noted. Grade 1 anterolisthesis of L4 on L5 is likely chronic. IMPRESSION: 1. Nonspecific RIGHT perinephric inflammation with fullness of the RIGHT intrarenal collecting system, likely representing infection. Pyelonephritis is difficult to determine on this noncontrast study. 2. No other acute abnormality. 3. Degenerative changes in the lumbar spine. 4.  Aortic Atherosclerosis (ICD10-I70.0). Electronically Signed   By: Harmon Pier M.D.   On: 08/01/2018 13:21     Assessment/Plan 1. PAD (peripheral  artery disease) (HCC) Recommend:  The patient is status post successful angiogram with intervention.  The patient reports that the claudication symptoms and leg pain is essentially gone.   The patient denies lifestyle limiting changes at this point in time.  No further invasive studies, angiography or surgery at this time The patient should continue walking and begin a more formal exercise program.  The patient should continue antiplatelet therapy and aggressive treatment of the lipid abnormalities  Smoking cessation was again discussed  The patient should continue wearing graduated compression socks 10-15 mmHg strength to control the mild edema.  Patient should undergo noninvasive studies as ordered. The patient will follow up with me after the studies.   - VAS [place] LOWER EXTREMITY ARTERIAL DUPLEX; Future - VAS [place] ABI WITH/WO TBI; Future  2. Essential hypertension Continue antihypertensive medications as already ordered, these medications have been reviewed and there are no changes at this time.   3. DM (diabetes mellitus), type 2 with complications (HCC) Continue hypoglycemic medications as already ordered, these medications have been reviewed and there are no changes at this time.  Hgb A1C to be monitored as already arranged by primary service   4. Mixed hyperlipidemia Continue statin as ordered and reviewed, no changes at this time   Levora Dredge, MD  08/18/2018 2:47 PM

## 2018-08-19 DIAGNOSIS — N10 Acute tubulo-interstitial nephritis: Secondary | ICD-10-CM | POA: Diagnosis not present

## 2018-08-19 DIAGNOSIS — R32 Unspecified urinary incontinence: Secondary | ICD-10-CM | POA: Diagnosis not present

## 2018-08-19 DIAGNOSIS — I69354 Hemiplegia and hemiparesis following cerebral infarction affecting left non-dominant side: Secondary | ICD-10-CM | POA: Diagnosis not present

## 2018-08-19 DIAGNOSIS — E1151 Type 2 diabetes mellitus with diabetic peripheral angiopathy without gangrene: Secondary | ICD-10-CM | POA: Diagnosis not present

## 2018-08-19 DIAGNOSIS — I10 Essential (primary) hypertension: Secondary | ICD-10-CM | POA: Diagnosis not present

## 2018-08-19 DIAGNOSIS — K219 Gastro-esophageal reflux disease without esophagitis: Secondary | ICD-10-CM | POA: Diagnosis not present

## 2018-08-19 DIAGNOSIS — Z7982 Long term (current) use of aspirin: Secondary | ICD-10-CM | POA: Diagnosis not present

## 2018-08-19 DIAGNOSIS — I69328 Other speech and language deficits following cerebral infarction: Secondary | ICD-10-CM | POA: Diagnosis not present

## 2018-08-19 DIAGNOSIS — E782 Mixed hyperlipidemia: Secondary | ICD-10-CM | POA: Diagnosis not present

## 2018-08-19 DIAGNOSIS — B9620 Unspecified Escherichia coli [E. coli] as the cause of diseases classified elsewhere: Secondary | ICD-10-CM | POA: Diagnosis not present

## 2018-08-20 ENCOUNTER — Other Ambulatory Visit: Payer: Self-pay | Admitting: Family Medicine

## 2018-08-20 DIAGNOSIS — D696 Thrombocytopenia, unspecified: Secondary | ICD-10-CM

## 2018-08-26 DIAGNOSIS — R32 Unspecified urinary incontinence: Secondary | ICD-10-CM | POA: Diagnosis not present

## 2018-08-26 DIAGNOSIS — E1151 Type 2 diabetes mellitus with diabetic peripheral angiopathy without gangrene: Secondary | ICD-10-CM | POA: Diagnosis not present

## 2018-08-26 DIAGNOSIS — I10 Essential (primary) hypertension: Secondary | ICD-10-CM | POA: Diagnosis not present

## 2018-08-26 DIAGNOSIS — Z7982 Long term (current) use of aspirin: Secondary | ICD-10-CM | POA: Diagnosis not present

## 2018-08-26 DIAGNOSIS — N10 Acute tubulo-interstitial nephritis: Secondary | ICD-10-CM | POA: Diagnosis not present

## 2018-08-26 DIAGNOSIS — E782 Mixed hyperlipidemia: Secondary | ICD-10-CM | POA: Diagnosis not present

## 2018-08-26 DIAGNOSIS — B9620 Unspecified Escherichia coli [E. coli] as the cause of diseases classified elsewhere: Secondary | ICD-10-CM | POA: Diagnosis not present

## 2018-08-26 DIAGNOSIS — I69354 Hemiplegia and hemiparesis following cerebral infarction affecting left non-dominant side: Secondary | ICD-10-CM | POA: Diagnosis not present

## 2018-08-26 DIAGNOSIS — I69328 Other speech and language deficits following cerebral infarction: Secondary | ICD-10-CM | POA: Diagnosis not present

## 2018-08-26 DIAGNOSIS — K219 Gastro-esophageal reflux disease without esophagitis: Secondary | ICD-10-CM | POA: Diagnosis not present

## 2018-08-28 DIAGNOSIS — E782 Mixed hyperlipidemia: Secondary | ICD-10-CM | POA: Diagnosis not present

## 2018-08-28 DIAGNOSIS — K219 Gastro-esophageal reflux disease without esophagitis: Secondary | ICD-10-CM | POA: Diagnosis not present

## 2018-08-28 DIAGNOSIS — E1151 Type 2 diabetes mellitus with diabetic peripheral angiopathy without gangrene: Secondary | ICD-10-CM | POA: Diagnosis not present

## 2018-08-28 DIAGNOSIS — I10 Essential (primary) hypertension: Secondary | ICD-10-CM | POA: Diagnosis not present

## 2018-08-28 DIAGNOSIS — R32 Unspecified urinary incontinence: Secondary | ICD-10-CM | POA: Diagnosis not present

## 2018-08-28 DIAGNOSIS — B9620 Unspecified Escherichia coli [E. coli] as the cause of diseases classified elsewhere: Secondary | ICD-10-CM | POA: Diagnosis not present

## 2018-08-28 DIAGNOSIS — Z7982 Long term (current) use of aspirin: Secondary | ICD-10-CM | POA: Diagnosis not present

## 2018-08-28 DIAGNOSIS — I69354 Hemiplegia and hemiparesis following cerebral infarction affecting left non-dominant side: Secondary | ICD-10-CM | POA: Diagnosis not present

## 2018-08-28 DIAGNOSIS — I69328 Other speech and language deficits following cerebral infarction: Secondary | ICD-10-CM | POA: Diagnosis not present

## 2018-08-28 DIAGNOSIS — N10 Acute tubulo-interstitial nephritis: Secondary | ICD-10-CM | POA: Diagnosis not present

## 2018-09-01 DIAGNOSIS — B9620 Unspecified Escherichia coli [E. coli] as the cause of diseases classified elsewhere: Secondary | ICD-10-CM | POA: Diagnosis not present

## 2018-09-01 DIAGNOSIS — N10 Acute tubulo-interstitial nephritis: Secondary | ICD-10-CM | POA: Diagnosis not present

## 2018-09-01 DIAGNOSIS — K219 Gastro-esophageal reflux disease without esophagitis: Secondary | ICD-10-CM | POA: Diagnosis not present

## 2018-09-01 DIAGNOSIS — I69328 Other speech and language deficits following cerebral infarction: Secondary | ICD-10-CM | POA: Diagnosis not present

## 2018-09-01 DIAGNOSIS — Z7982 Long term (current) use of aspirin: Secondary | ICD-10-CM | POA: Diagnosis not present

## 2018-09-01 DIAGNOSIS — R32 Unspecified urinary incontinence: Secondary | ICD-10-CM | POA: Diagnosis not present

## 2018-09-01 DIAGNOSIS — E1151 Type 2 diabetes mellitus with diabetic peripheral angiopathy without gangrene: Secondary | ICD-10-CM | POA: Diagnosis not present

## 2018-09-01 DIAGNOSIS — I69354 Hemiplegia and hemiparesis following cerebral infarction affecting left non-dominant side: Secondary | ICD-10-CM | POA: Diagnosis not present

## 2018-09-01 DIAGNOSIS — I10 Essential (primary) hypertension: Secondary | ICD-10-CM | POA: Diagnosis not present

## 2018-09-01 DIAGNOSIS — E782 Mixed hyperlipidemia: Secondary | ICD-10-CM | POA: Diagnosis not present

## 2018-09-02 ENCOUNTER — Other Ambulatory Visit: Payer: Self-pay | Admitting: Family Medicine

## 2018-09-02 DIAGNOSIS — E1151 Type 2 diabetes mellitus with diabetic peripheral angiopathy without gangrene: Secondary | ICD-10-CM | POA: Diagnosis not present

## 2018-09-02 DIAGNOSIS — K219 Gastro-esophageal reflux disease without esophagitis: Secondary | ICD-10-CM | POA: Diagnosis not present

## 2018-09-02 DIAGNOSIS — B9620 Unspecified Escherichia coli [E. coli] as the cause of diseases classified elsewhere: Secondary | ICD-10-CM | POA: Diagnosis not present

## 2018-09-02 DIAGNOSIS — N10 Acute tubulo-interstitial nephritis: Secondary | ICD-10-CM | POA: Diagnosis not present

## 2018-09-02 DIAGNOSIS — I10 Essential (primary) hypertension: Secondary | ICD-10-CM | POA: Diagnosis not present

## 2018-09-02 DIAGNOSIS — R32 Unspecified urinary incontinence: Secondary | ICD-10-CM | POA: Diagnosis not present

## 2018-09-02 DIAGNOSIS — I69354 Hemiplegia and hemiparesis following cerebral infarction affecting left non-dominant side: Secondary | ICD-10-CM | POA: Diagnosis not present

## 2018-09-02 DIAGNOSIS — Z7982 Long term (current) use of aspirin: Secondary | ICD-10-CM | POA: Diagnosis not present

## 2018-09-02 DIAGNOSIS — E782 Mixed hyperlipidemia: Secondary | ICD-10-CM | POA: Diagnosis not present

## 2018-09-02 DIAGNOSIS — I69328 Other speech and language deficits following cerebral infarction: Secondary | ICD-10-CM | POA: Diagnosis not present

## 2018-09-02 MED ORDER — ATORVASTATIN CALCIUM 40 MG PO TABS: ORAL_TABLET | ORAL | 2 refills | Status: DC

## 2018-09-02 NOTE — Telephone Encounter (Signed)
Requested Prescriptions  Pending Prescriptions Disp Refills  . atorvastatin (LIPITOR) 40 MG tablet 90 tablet 2    Sig: TAKE 1 TABLET BY MOUTH ONCE DAILY AT 6PM     Cardiovascular:  Antilipid - Statins Failed - 09/02/2018  8:53 AM      Failed - LDL in normal range and within 360 days    LDL Cholesterol  Date Value Ref Range Status  11/01/2016 19 0 - 99 mg/dL Final         Failed - HDL in normal range and within 360 days    HDL  Date Value Ref Range Status  02/19/2018 32.70 (L) >39.00 mg/dL Final         Failed - Triglycerides in normal range and within 360 days    Triglycerides  Date Value Ref Range Status  02/19/2018 212.0 (H) 0.0 - 149.0 mg/dL Final    Comment:    Normal:  <150 mg/dLBorderline High:  150 - 199 mg/dL         Passed - Total Cholesterol in normal range and within 360 days    Cholesterol  Date Value Ref Range Status  02/19/2018 106 0 - 200 mg/dL Final    Comment:    ATP III Classification       Desirable:  < 200 mg/dL               Borderline High:  200 - 239 mg/dL          High:  > = 937 mg/dL         Passed - Patient is not pregnant      Passed - Valid encounter within last 12 months    Recent Outpatient Visits          3 weeks ago Pyelonephritis   [facility] [place], Yehuda Mao, MD   3 months ago Essential hypertension   [facility] [place] Glori Luis, MD   6 months ago Acute right-sided low back pain without sciatica   [facility] Glori Luis, MD   10 months ago Urine frequency   [facility] [place] Glori Luis, MD   1 year ago Essential hypertension   [facility] [place], Yehuda Mao, MD      Future Appointments            In 1 month Birdie Sons, Yehuda Mao, MD [facility] [place]   In 2 months O'Brien-Blaney, Denisa L, LPN; Birdie Sons, Yehuda Mao, MD [facility], [facility]

## 2018-09-04 DIAGNOSIS — K219 Gastro-esophageal reflux disease without esophagitis: Secondary | ICD-10-CM | POA: Diagnosis not present

## 2018-09-04 DIAGNOSIS — E1151 Type 2 diabetes mellitus with diabetic peripheral angiopathy without gangrene: Secondary | ICD-10-CM | POA: Diagnosis not present

## 2018-09-04 DIAGNOSIS — R32 Unspecified urinary incontinence: Secondary | ICD-10-CM | POA: Diagnosis not present

## 2018-09-04 DIAGNOSIS — B9620 Unspecified Escherichia coli [E. coli] as the cause of diseases classified elsewhere: Secondary | ICD-10-CM | POA: Diagnosis not present

## 2018-09-04 DIAGNOSIS — Z7982 Long term (current) use of aspirin: Secondary | ICD-10-CM | POA: Diagnosis not present

## 2018-09-04 DIAGNOSIS — E782 Mixed hyperlipidemia: Secondary | ICD-10-CM | POA: Diagnosis not present

## 2018-09-04 DIAGNOSIS — I69328 Other speech and language deficits following cerebral infarction: Secondary | ICD-10-CM | POA: Diagnosis not present

## 2018-09-04 DIAGNOSIS — N10 Acute tubulo-interstitial nephritis: Secondary | ICD-10-CM | POA: Diagnosis not present

## 2018-09-04 DIAGNOSIS — I69354 Hemiplegia and hemiparesis following cerebral infarction affecting left non-dominant side: Secondary | ICD-10-CM | POA: Diagnosis not present

## 2018-09-04 DIAGNOSIS — I10 Essential (primary) hypertension: Secondary | ICD-10-CM | POA: Diagnosis not present

## 2018-09-05 DIAGNOSIS — Z7982 Long term (current) use of aspirin: Secondary | ICD-10-CM | POA: Diagnosis not present

## 2018-09-05 DIAGNOSIS — K219 Gastro-esophageal reflux disease without esophagitis: Secondary | ICD-10-CM | POA: Diagnosis not present

## 2018-09-05 DIAGNOSIS — E782 Mixed hyperlipidemia: Secondary | ICD-10-CM | POA: Diagnosis not present

## 2018-09-05 DIAGNOSIS — R32 Unspecified urinary incontinence: Secondary | ICD-10-CM | POA: Diagnosis not present

## 2018-09-05 DIAGNOSIS — N10 Acute tubulo-interstitial nephritis: Secondary | ICD-10-CM | POA: Diagnosis not present

## 2018-09-05 DIAGNOSIS — B9620 Unspecified Escherichia coli [E. coli] as the cause of diseases classified elsewhere: Secondary | ICD-10-CM | POA: Diagnosis not present

## 2018-09-05 DIAGNOSIS — E1151 Type 2 diabetes mellitus with diabetic peripheral angiopathy without gangrene: Secondary | ICD-10-CM | POA: Diagnosis not present

## 2018-09-05 DIAGNOSIS — I10 Essential (primary) hypertension: Secondary | ICD-10-CM | POA: Diagnosis not present

## 2018-09-05 DIAGNOSIS — I69328 Other speech and language deficits following cerebral infarction: Secondary | ICD-10-CM | POA: Diagnosis not present

## 2018-09-05 DIAGNOSIS — I69354 Hemiplegia and hemiparesis following cerebral infarction affecting left non-dominant side: Secondary | ICD-10-CM | POA: Diagnosis not present

## 2018-09-10 ENCOUNTER — Other Ambulatory Visit (INDEPENDENT_AMBULATORY_CARE_PROVIDER_SITE_OTHER): Payer: Medicare HMO

## 2018-09-10 ENCOUNTER — Other Ambulatory Visit: Payer: Self-pay

## 2018-09-10 DIAGNOSIS — D696 Thrombocytopenia, unspecified: Secondary | ICD-10-CM | POA: Diagnosis not present

## 2018-09-10 LAB — CBC
HCT: 33.2 % — ABNORMAL LOW (ref 36.0–46.0)
Hemoglobin: 11.2 g/dL — ABNORMAL LOW (ref 12.0–15.0)
MCHC: 33.7 g/dL (ref 30.0–36.0)
MCV: 90.4 fl (ref 78.0–100.0)
Platelets: 297 10*3/uL (ref 150.0–400.0)
RBC: 3.68 Mil/uL — ABNORMAL LOW (ref 3.87–5.11)
RDW: 13.3 % (ref 11.5–15.5)
WBC: 7.8 10*3/uL (ref 4.0–10.5)

## 2018-09-11 ENCOUNTER — Other Ambulatory Visit: Payer: Self-pay | Admitting: Family Medicine

## 2018-09-11 DIAGNOSIS — D649 Anemia, unspecified: Secondary | ICD-10-CM

## 2018-09-23 ENCOUNTER — Other Ambulatory Visit: Payer: Self-pay | Admitting: Family Medicine

## 2018-09-25 DIAGNOSIS — I6350 Cerebral infarction due to unspecified occlusion or stenosis of unspecified cerebral artery: Secondary | ICD-10-CM | POA: Diagnosis not present

## 2018-09-25 DIAGNOSIS — R0683 Snoring: Secondary | ICD-10-CM | POA: Diagnosis not present

## 2018-09-25 DIAGNOSIS — R200 Anesthesia of skin: Secondary | ICD-10-CM | POA: Diagnosis not present

## 2018-09-25 DIAGNOSIS — R202 Paresthesia of skin: Secondary | ICD-10-CM | POA: Diagnosis not present

## 2018-10-03 ENCOUNTER — Other Ambulatory Visit: Payer: Self-pay

## 2018-10-03 ENCOUNTER — Ambulatory Visit (INDEPENDENT_AMBULATORY_CARE_PROVIDER_SITE_OTHER): Payer: Medicare HMO | Admitting: Family Medicine

## 2018-10-03 ENCOUNTER — Telehealth: Payer: Self-pay | Admitting: Family Medicine

## 2018-10-03 ENCOUNTER — Encounter: Payer: Self-pay | Admitting: Family Medicine

## 2018-10-03 DIAGNOSIS — I10 Essential (primary) hypertension: Secondary | ICD-10-CM

## 2018-10-03 DIAGNOSIS — Z8673 Personal history of transient ischemic attack (TIA), and cerebral infarction without residual deficits: Secondary | ICD-10-CM

## 2018-10-03 DIAGNOSIS — E118 Type 2 diabetes mellitus with unspecified complications: Secondary | ICD-10-CM

## 2018-10-03 DIAGNOSIS — D649 Anemia, unspecified: Secondary | ICD-10-CM | POA: Insufficient documentation

## 2018-10-03 DIAGNOSIS — I739 Peripheral vascular disease, unspecified: Secondary | ICD-10-CM | POA: Diagnosis not present

## 2018-10-03 NOTE — Progress Notes (Signed)
Virtual Visit via video Note  This visit type was conducted due to national recommendations for restrictions regarding the COVID-19 pandemic (e.g. social distancing).  This format is felt to be most appropriate for this patient at this time.  All issues noted in this document were discussed and addressed.  No physical exam was performed (except for noted visual exam findings with Video Visits).   I connected with Crystal Miller today at 10:00 AM EDT by a video enabled telemedicine application and verified that I am speaking with the correct person using two identifiers. Location patient: home Location provider: work Persons participating in the virtual visit: patient, provider, Donia Ast (grandson)  I discussed the limitations, risks, security and privacy concerns of performing an evaluation and management service by telephone and the availability of in person appointments. I also discussed with the patient that there may be a patient responsible charge related to this service. The patient expressed understanding and agreed to proceed.   Reason for visit: Follow-up  HPI: History of stroke: currently on aspirin.  She has also been on Plavix related to PAD and recent angioplasty.  She notes no numbness or weakness.  PAD: She underwent angioplasty and tolerated this well.  She has had no claudication.  She notes her vascular surgeon advised that she could come off of the Plavix when she finished her current prescription.  She is not smoking.  Diabetes: Notes her blood sugar was 141 this morning.  Her sugars have improved per her report.  Taking metformin.  No polyuria or polydipsia.  No hypoglycemia.  Anemia: This was noted during her hospitalization for pyelonephritis.  It had improved to near normal.  She is due for recheck in the next month or so.  Blood pressure was 123/68 and pulse 78 today.  Patient reports she feels quite well.   ROS: See pertinent positives and negatives per  HPI.  Past Medical History:  Diagnosis Date  . Acute pyelonephritis 08/01/2018  . Depression    husband died in 2015/07/29  . DM (diabetes mellitus), type 2 with complications ([place]) 07/16/3333  . Essential hypertension 11/01/2016  . GERD (gastroesophageal reflux disease)   . Mixed hyperlipidemia 11/01/2016  . Peripheral vascular disease ([place])   . Renal insufficiency   . Stroke ([place]) 2016-07-28   slight residual with speech. does not drive Lt side weakness    Past Surgical History:  Procedure Laterality Date  . CHOLECYSTECTOMY    . LOWER EXTREMITY ANGIOGRAPHY Left 12/31/2017   Procedure: LOWER EXTREMITY ANGIOGRAPHY;  Surgeon: Katha Cabal, MD;  Location: [facility];  Service: Cardiovascular;  Laterality: Left;  . LOWER EXTREMITY ANGIOGRAPHY Right 04/22/2018   Procedure: LOWER EXTREMITY ANGIOGRAPHY;  Surgeon: Katha Cabal, MD;  Location: [facility];  Service: Cardiovascular;  Laterality: Right;    Family History  Problem Relation Age of Onset  . Alzheimer's disease Mother   . Arthritis Mother   . Hypertension Mother   . CVA Father   . CAD Father   . Heart disease Father   . Stroke Father   . Hypertension Father   . Stroke Brother   . Breast cancer Neg Hx     SOCIAL HX: Former smoker.   Current Outpatient Medications:  .  acetaminophen (TYLENOL) 500 MG tablet, Take 1,000 mg by mouth every 6 (six) hours as needed (for pain.)., Disp: , Rfl:  .  alendronate (FOSAMAX) 70 MG tablet, TAKE 1 TABLET BY MOUTH EVERY 7 DAYS. TAKE  WITH A FULL GLASS OF WATER ON AN EMPTY STOMACH., Disp: 12 tablet, Rfl: 0 .  amLODipine (NORVASC) 10 MG tablet, Take 1 tablet (10 mg total) by mouth daily., Disp: 90 tablet, Rfl: 3 .  aspirin EC 81 MG EC tablet, Take 1 tablet (81 mg total) by mouth daily. (Patient taking differently: Take 81 mg by mouth daily. In the morning), Disp: 30 tablet, Rfl: 2 .  atorvastatin (LIPITOR) 40 MG tablet, TAKE 1 TABLET BY MOUTH ONCE DAILY AT 6PM, Disp: 90  tablet, Rfl: 2 .  blood glucose meter kit and supplies KIT, Dispense based on patient and insurance preference. Use up to four times daily as directed. (FOR ICD-9 250.00, 250.01)., Disp: 1 each, Rfl: 0 .  carvedilol (COREG) 6.25 MG tablet, Take 1 tablet (6.25 mg total) by mouth 2 (two) times daily with a meal., Disp: 180 tablet, Rfl: 3 .  clopidogrel (PLAVIX) 75 MG tablet, Take 1 tablet (75 mg total) by mouth daily., Disp: 30 tablet, Rfl: 11 .  metFORMIN (GLUCOPHAGE XR) 500 MG 24 hr tablet, Take 1 tablet (500 mg total) by mouth daily with breakfast., Disp: 90 tablet, Rfl: 1 .  Multiple Vitamin (MULTIVITAMIN WITH MINERALS) TABS tablet, Take 1 tablet by mouth daily. Centrum Silver . In the morning, Disp: , Rfl:  .  Polyethyl Glycol-Propyl Glycol (LUBRICANT EYE DROPS) 0.4-0.3 % SOLN, Place 1 drop into both eyes 3 (three) times daily as needed (for dry eyes.)., Disp: , Rfl:  .  sertraline (ZOLOFT) 50 MG tablet, Take 0.5 tablets (25 mg total) by mouth daily., Disp: 45 tablet, Rfl: 3 .  vitamin B-12 (CYANOCOBALAMIN) 1000 MCG tablet, Take 1,000 mcg by mouth daily. , Disp: , Rfl:  .  solifenacin (VESICARE) 5 MG tablet, Take 1 tablet (5 mg total) by mouth daily. (Patient not taking: Reported on 08/07/2018), Disp: 30 tablet, Rfl: 1  EXAM:  VITALS per patient if applicable: BP 546/27, pulse 78.  GENERAL: alert, oriented, appears well and in no acute distress  HEENT: atraumatic, conjunttiva clear, no obvious abnormalities on inspection of external nose and ears  NECK: normal movements of the head and neck  LUNGS: on inspection no signs of respiratory distress, breathing rate appears normal, no obvious gross SOB, gasping or wheezing  CV: no obvious cyanosis  MS: moves all visible extremities without noticeable abnormality  PSYCH/NEURO: pleasant and cooperative, no obvious depression or anxiety, speech and thought processing grossly intact  ASSESSMENT AND PLAN:  Discussed the following assessment and  plan:  Essential hypertension - Plan: Lipid panel, Comp Met (CMET)  PAD (peripheral artery disease) (HCC)  DM (diabetes mellitus), type 2 with complications ([place]) - Plan: Hemoglobin A1c  History of stroke  Anemia, unspecified type - Plan: CBC  Essential hypertension Well-controlled per patient report.  Continue current medications.  PAD (peripheral artery disease) (HCC) Asymptomatic.  She will monitor for recurrence of symptoms and continue to follow with vascular surgery.  DM (diabetes mellitus), type 2 with complications (HCC) Continue metformin.  Will have her come in for lab work in 1 month.  History of stroke Stable.  She will continue to follow with neurology.  Anemia Normocytic.  Plan for recheck in 1 month.  CMA will contact patient and get her set up for lab work in 1 month and follow-up in 4 months.  Social distancing precautions and sick precautions given regarding COVID-19.   I discussed the assessment and treatment plan with the patient. The patient was provided an opportunity to ask  questions and all were answered. The patient agreed with the plan and demonstrated an understanding of the instructions.   The patient was advised to call back or seek an in-person evaluation if the symptoms worsen or if the condition fails to improve as anticipated.   Tommi Rumps, MD

## 2018-10-03 NOTE — Assessment & Plan Note (Signed)
Well-controlled per patient report.  Continue current medications.

## 2018-10-03 NOTE — Assessment & Plan Note (Signed)
Stable.  She will continue to follow with neurology. 

## 2018-10-03 NOTE — Assessment & Plan Note (Signed)
Normocytic.  Plan for recheck in 1 month.

## 2018-10-03 NOTE — Assessment & Plan Note (Signed)
Continue metformin.  Will have her come in for lab work in 1 month.

## 2018-10-03 NOTE — Assessment & Plan Note (Signed)
Asymptomatic.  She will monitor for recurrence of symptoms and continue to follow with vascular surgery.

## 2018-10-03 NOTE — Telephone Encounter (Signed)
Please contact the patient and get her set up for follow-up in 4 months.  She should also be scheduled for lab work in 1 month.  Orders have been placed.

## 2018-10-22 ENCOUNTER — Other Ambulatory Visit (INDEPENDENT_AMBULATORY_CARE_PROVIDER_SITE_OTHER): Payer: Medicare HMO

## 2018-10-22 ENCOUNTER — Other Ambulatory Visit: Payer: Self-pay

## 2018-10-22 DIAGNOSIS — I10 Essential (primary) hypertension: Secondary | ICD-10-CM | POA: Diagnosis not present

## 2018-10-22 DIAGNOSIS — D649 Anemia, unspecified: Secondary | ICD-10-CM | POA: Diagnosis not present

## 2018-10-22 DIAGNOSIS — E118 Type 2 diabetes mellitus with unspecified complications: Secondary | ICD-10-CM | POA: Diagnosis not present

## 2018-10-22 LAB — CBC
HCT: 33.4 % — ABNORMAL LOW (ref 36.0–46.0)
Hemoglobin: 11.6 g/dL — ABNORMAL LOW (ref 12.0–15.0)
MCHC: 34.6 g/dL (ref 30.0–36.0)
MCV: 90.1 fl (ref 78.0–100.0)
Platelets: 288 10*3/uL (ref 150.0–400.0)
RBC: 3.71 Mil/uL — ABNORMAL LOW (ref 3.87–5.11)
RDW: 12.8 % (ref 11.5–15.5)
WBC: 7.2 10*3/uL (ref 4.0–10.5)

## 2018-10-22 LAB — COMPREHENSIVE METABOLIC PANEL
ALT: 21 U/L (ref 0–35)
AST: 20 U/L (ref 0–37)
Albumin: 4 g/dL (ref 3.5–5.2)
Alkaline Phosphatase: 48 U/L (ref 39–117)
BUN: 30 mg/dL — ABNORMAL HIGH (ref 6–23)
CO2: 26 mEq/L (ref 19–32)
Calcium: 9.5 mg/dL (ref 8.4–10.5)
Chloride: 101 mEq/L (ref 96–112)
Creatinine, Ser: 1.33 mg/dL — ABNORMAL HIGH (ref 0.40–1.20)
GFR: 47.16 mL/min — ABNORMAL LOW (ref >60.00)
Glucose, Bld: 239 mg/dL — ABNORMAL HIGH (ref 70–99)
Potassium: 4.8 mEq/L (ref 3.5–5.1)
Sodium: 133 mEq/L — ABNORMAL LOW (ref 135–145)
Total Bilirubin: 0.4 mg/dL (ref 0.2–1.2)
Total Protein: 7.3 g/dL (ref 6.0–8.3)

## 2018-10-22 LAB — LIPID PANEL
Cholesterol: 86 mg/dL (ref 0–200)
HDL: 33.1 mg/dL — ABNORMAL LOW (ref >39.00)
LDL Cholesterol: 32 mg/dL (ref 0–99)
NonHDL: 53.34
Total CHOL/HDL Ratio: 3
Triglycerides: 109 mg/dL (ref 0.0–149.0)
VLDL: 21.8 mg/dL (ref 0.0–40.0)

## 2018-10-22 LAB — HEMOGLOBIN A1C: Hgb A1c MFr Bld: 6.6 % — ABNORMAL HIGH (ref 4.6–6.5)

## 2018-10-26 ENCOUNTER — Other Ambulatory Visit: Payer: Self-pay | Admitting: Family Medicine

## 2018-10-26 DIAGNOSIS — D649 Anemia, unspecified: Secondary | ICD-10-CM

## 2018-10-26 DIAGNOSIS — N179 Acute kidney failure, unspecified: Secondary | ICD-10-CM

## 2018-11-04 ENCOUNTER — Telehealth: Payer: Self-pay | Admitting: Family Medicine

## 2018-11-04 NOTE — Telephone Encounter (Signed)
Pt wanted a call back about her lab results

## 2018-11-06 ENCOUNTER — Telehealth: Payer: Self-pay | Admitting: Family Medicine

## 2018-11-06 NOTE — Telephone Encounter (Signed)
Pt states she was contacted by [facility] to say that there is a recall on her metFORMIN (GLUCOPHAGE XR) 500 MG 24 hr tablet.  Pt was told to get intouch with her PCP to find out what they wanted her to do.  Pt also states that she never got results from last lab work that was done,

## 2018-11-10 ENCOUNTER — Ambulatory Visit
Admission: RE | Admit: 2018-11-10 | Discharge: 2018-11-10 | Disposition: A | Discharge status: Home / Self Care | Payer: Medicare HMO | Source: Ambulatory Visit | Attending: Family Medicine | Admitting: Family Medicine

## 2018-11-10 ENCOUNTER — Other Ambulatory Visit: Payer: Self-pay

## 2018-11-10 DIAGNOSIS — Z1231 Encounter for screening mammogram for malignant neoplasm of breast: Secondary | ICD-10-CM | POA: Diagnosis not present

## 2018-11-10 NOTE — Telephone Encounter (Signed)
She should discontinue the extended release Metformin.  We can place her on immediate release Metformin 500 mg by mouth once daily and see if she can tolerate that.  If she is willing to try that again I can send to her pharmacy.  Please see the result note from 10/22/2018 to give her her results.  Thanks.

## 2018-11-11 ENCOUNTER — Ambulatory Visit: Payer: Medicare HMO | Admitting: Family Medicine

## 2018-11-11 MED ORDER — METFORMIN HCL 500 MG PO TABS: 500.0000 mg | ORAL_TABLET | Freq: Every day | ORAL | 3 refills | Status: DC

## 2018-11-11 NOTE — Telephone Encounter (Signed)
Patient returning call to [facility]. Attempted office x 2, no answer. Please advise.  CB#: [phone]

## 2018-11-11 NOTE — Telephone Encounter (Signed)
Pt states she is ok with the metformin change you can send it to pharmacy.  I gave her lab results and she states she has not taken any aleve or ibuprophen and she has had no vomiting or diarrhea, she will call back to schedule because her daughter has to bring her to her appointments.  Christena Sunderlin,cma

## 2018-11-11 NOTE — Telephone Encounter (Signed)
Noted.  Metformin sent to pharmacy.  Please follow-up with the patient in the next several days to ensure that she schedules a lab appointment.  Thanks.

## 2018-11-11 NOTE — Telephone Encounter (Signed)
Called and lmtcb to discuss medication.  Nina,cma

## 2018-11-13 DIAGNOSIS — H40003 Preglaucoma, unspecified, bilateral: Secondary | ICD-10-CM | POA: Diagnosis not present

## 2018-11-13 LAB — HM DIABETES EYE EXAM

## 2018-11-13 NOTE — Telephone Encounter (Signed)
Called and spoke to patien informing her that her metformin was sent to pharmacy and scheduled labs.  Rayne Cowdrey,cma

## 2018-11-20 ENCOUNTER — Ambulatory Visit (INDEPENDENT_AMBULATORY_CARE_PROVIDER_SITE_OTHER): Payer: Medicare HMO

## 2018-11-20 ENCOUNTER — Other Ambulatory Visit: Payer: Self-pay

## 2018-11-20 DIAGNOSIS — Z0000 Encounter for general adult medical examination without abnormal findings: Secondary | ICD-10-CM

## 2018-11-20 NOTE — Patient Instructions (Addendum)
  Crystal Miller , Thank you for taking time to come for your Medicare Wellness Visit. I appreciate your ongoing commitment to your health goals. Please review the following plan we discussed and let me know if I can assist you in the future.   These are the goals we discussed: Goals      Patient Stated   . HEMOGLOBIN A1C < 7 (pt-stated)     Maintain levels       This is a list of the screening recommended for you and due dates:  Health Maintenance  Topic Date Due  . Tetanus Vaccine  07/21/1964  . Complete foot exam   07/31/2018  . Flu Shot  12/20/2018  . Urine Protein Check  02/20/2019  . Hemoglobin A1C  04/23/2019  . Eye exam for diabetics  11/12/2019  . Cologuard (Stool DNA test)  11/15/2019  . Mammogram  11/09/2020  . DEXA scan (bone density measurement)  Completed  . Pneumonia vaccines  Completed

## 2018-11-20 NOTE — Progress Notes (Signed)
Subjective:   Crystal Miller is a 73 y.o. female who presents for Medicare Annual (Subsequent) preventive examination.  Review of Systems:  No ROS.  Medicare Wellness Virtual Visit.  Visual/audio telehealth visit, UTA vital signs.   See social history for additional risk factors.   Cardiac Risk Factors include: advanced age (>70mn, >>17women);hypertension;diabetes mellitus     Objective:     Vitals: There were no vitals taken for this visit.  There is no height or weight on file to calculate BMI.  Advanced Directives 11/20/2018 08/01/2018 08/01/2018 12/31/2017 10/30/2017 03/14/2017 01/01/2017  Does Patient Have a Medical Advance Directive? Yes Yes Yes No Yes Yes Yes  Type of Advance Directive Out of facility DNR (pink MOST or yellow form) Out of facility DNR (pink MOST or yellow form) Out of facility DNR (pink MOST or yellow form) - HPress photographerOut of facility DNR (pink MOST or yellow form) H[place]cadiaLiving will HSt. Land[place]Living will  Does patient want to make changes to medical advance directive? No - Patient declined No - Patient declined No - Patient declined - No - Patient declined - -  Copy of [place]? - - - - No - copy requested Yes Yes  Would patient like information on creating a medical advance directive? - - - No - Patient declined - - -  Pre-existing out of facility DNR order (yellow form or pink MOST form) - Yellow form placed in chart (order not valid for inpatient use) Yellow form placed in chart (order not valid for inpatient use) - - - -    Tobacco Social History   Tobacco Use  Smoking Status Former Smoker  . Packs/day: 1.00  . Years: 20.00  . Pack years: 20.00  . Types: Cigarettes  . Quit date: 22012/02/26 . Years since quitting: 8.5  Smokeless Tobacco Never Used     Counseling given: Not Answered   Clinical Intake:  Pre-visit preparation completed: Yes        Diabetes:  Yes(Followed by pcp)  How often do you need to have someone help you when you read instructions, pamphlets, or other written materials from your doctor or pharmacy?: 2 - Rarely  Interpreter Needed?: No     Past Medical History:  Diagnosis Date  . Acute pyelonephritis 08/01/2018  . Depression    husband died in 2Feb 26, 2017 . DM (diabetes mellitus), type 2 with complications ([place] 62/77/4128 . Essential hypertension 11/01/2016  . GERD (gastroesophageal reflux disease)   . Mixed hyperlipidemia 11/01/2016  . Peripheral vascular disease ([place]   . Renal insufficiency   . [place] 202-26-[place]  slight residual with speech. does not drive Lt side weakness   Past Surgical History:  Procedure Laterality Date  . CHOLECYSTECTOMY    . LOWER EXTREMITY ANGIOGRAPHY Left 12/31/2017   Procedure: LOWER EXTREMITY ANGIOGRAPHY;  Surgeon: SKatha Cabal MD;  [facility];  Service: Cardiovascular;  Laterality: Left;  . LOWER EXTREMITY ANGIOGRAPHY Right 04/22/2018   Procedure: LOWER EXTREMITY ANGIOGRAPHY;  Surgeon: SKatha Cabal MD;  [facility];  Service: Cardiovascular;  Laterality: Right;   Family History  Problem Relation Age of Onset  . Alzheimer's disease Mother   . Arthritis Mother   . Hypertension Mother   . CVA Father   . CAD Father   . Heart disease Father   . Stroke Father   . Hypertension Father   .  Stroke Brother   . Breast cancer Neg Hx    Social History   Socioeconomic History  . Marital status: Widowed    Spouse name: Not on file  . Number of children: Not on file  . Years of education: Not on file  . Highest education level: Not on file  Occupational History  . 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 Financial resource strain: Not hard at all  . Food insecurity    Worry: Never true    Inability: Never true  . Transportation needs    Medical: No    Non-medical: No  Tobacco Use  . Smoking status:  Former Smoker    Packs/day: 1.00    Years: 20.00    Pack years: 20.00    Types: Cigarettes    Quit date: 2012    Years since quitting: 8.5  . Smokeless tobacco: Never Used  Substance and Sexual Activity  . Alcohol use: No  . Drug use: No  . Sexual activity: Not Currently    Birth control/protection: Post-menopausal  Lifestyle  . Physical activity    Days per week: 7 days    Minutes per session: 60 min  . Stress: Only a little  Relationships  . Social connections    Talks on phone: More than three times a week    Gets together: More than three times a week    Attends religious service: More than 4 times per year    Active member of club or organization: Yes    Attends meetings of clubs or organizations: 1 to 4 times per year    Relationship status: Widowed  Other Topics Concern  . Not on file  Social History Narrative  . Not on file    Outpatient Encounter Medications as of 11/20/2018  Medication Sig  . acetaminophen (TYLENOL) 500 MG tablet Take 1,000 mg by mouth every 6 (six) hours as needed (for pain.).  Marland Kitchen alendronate (FOSAMAX) 70 MG tablet TAKE 1 TABLET BY MOUTH EVERY 7 DAYS. TAKE WITH A FULL GLASS OF WATER ON AN EMPTY STOMACH.  Marland Kitchen amLODipine (NORVASC) 10 MG tablet Take 1 tablet (10 mg total) by mouth daily.  Marland Kitchen aspirin EC 81 MG EC tablet Take 1 tablet (81 mg total) by mouth daily. (Patient taking differently: Take 81 mg by mouth daily. In the morning)  . atorvastatin (LIPITOR) 40 MG tablet TAKE 1 TABLET BY MOUTH ONCE DAILY AT 6PM  . blood glucose meter kit and supplies KIT Dispense based on patient and insurance preference. Use up to four times daily as directed. (FOR ICD-9 250.00, 250.01).  . carvedilol (COREG) 6.25 MG tablet Take 1 tablet (6.25 mg total) by mouth 2 (two) times daily with a meal.  . clopidogrel (PLAVIX) 75 MG tablet Take 1 tablet (75 mg total) by mouth daily.  . metFORMIN (GLUCOPHAGE) 500 MG tablet Take 1 tablet (500 mg total) by mouth daily with breakfast.   . Multiple Vitamin (MULTIVITAMIN WITH MINERALS) TABS tablet Take 1 tablet by mouth daily. Centrum Silver . In the morning  . Polyethyl Glycol-Propyl Glycol (LUBRICANT EYE DROPS) 0.4-0.3 % SOLN Place 1 drop into both eyes 3 (three) times daily as needed (for dry eyes.).  Marland Kitchen sertraline (ZOLOFT) 50 MG tablet Take 0.5 tablets (25 mg total) by mouth daily.  . vitamin B-12 (CYANOCOBALAMIN) 1000 MCG tablet Take 1,000 mcg by mouth daily.   . solifenacin (VESICARE) 5 MG tablet Take 1 tablet (5 mg  total) by mouth daily. (Patient not taking: Reported on 11/20/2018)   No facility-administered encounter medications on file as of 11/20/2018.     Activities of Daily Living In your present state of health, do you have any difficulty performing the following activities: 11/20/2018 08/01/2018  Hearing? N N  Vision? N N  Difficulty concentrating or making decisions? - N  Walking or climbing stairs? Y Y  Comment Unsteady gait.  Rolling walker in use when ambulating. -  Dressing or bathing? N N  Doing errands, shopping? Y Y  Comment She does not drive Facilities manager and eating ? N -  Using the Toilet? N -  In the past six months, have you accidently leaked urine? Y -  Comment Bladder incontinence.  Wears depend brief daily. -  Do you have problems with loss of bowel control? N -  Managing your Medications? N -  Managing your Finances? N -  Housekeeping or managing your Housekeeping? N -  Some recent data might be hidden    Patient Care Team: [name], MD as PCP - General (Family Medicine)    Assessment:   This is a routine wellness examination for [place].  I connected with patient 11/20/18 at 11:30 AM EDT by a video/audio enabled telemedicine application and verified that I am speaking with the correct person using two identifiers. Patient stated full name and DOB. Patient gave permission to continue with virtual visit. Patient's location was at home and Nurse's location was at [place] office.    Monitors blood sugar daily, reports FBS average 140s. Monitors blood pressure daily, reports last 128/67, 64P, O2_0 %  Health Screenings  Mammogram - 10/2018 Cologuard- 10/2016 Bone Density - 10/2016 Glaucoma -none Hearing -demonstrates normal hearing during visit. Hemoglobin A1C - 10/2018 Cholesterol - 10/2018 Dental- dentures Vision- visits within the last 12 months.  Social  Alcohol intake - no        Smoking history- former  Smokers in home? none Illicit drug use? none Exercise - daily walking 60 minutes Diet - regular Sexually Active -not currently BMI- discussed the importance of a healthy diet, water intake and the benefits of aerobic exercise.  Educational material provided.   Safety  Patient feels safe at home- yes Patient does have smoke detectors at home- yes Patient does wear sunscreen or protective clothing when in direct sunlight -yes Patient does wear seat belt when in a moving vehicle -yes Patient drives- no  [id number] precautions and sickness symptoms discussed.   Activities of Daily Living Patient denies needing assistance with: household chores, feeding themselves, getting from bed to chair, getting to the toilet, bathing/showering, dressing, managing money, or preparing meals.  No new identified risk were noted.    Depression Screen Patient denies losing interest in daily life, feeling hopeless, or crying easily over simple problems.   Medication-taking as directed and without issues.   Fall Screen Patient denies being afraid of falling or falling in the last year.   Memory Screen Patient is alert.  Patient denies difficulty focusing, concentrating or misplacing items. Correctly identified the president of the [place], season and recall. Patient likes to reads th bible, plays computer games, and/or work puzzles for brain stimulation.  Immunizations The following Immunizations were discussed: Influenza, shingles, pneumonia, and tetanus.   Other  Providers Patient Care Team: [name], MD as PCP - General (Family Medicine)  Exercise Activities and Dietary recommendations Current Exercise Habits: Home exercise routine, Type of exercise: walking, Time (Minutes): 60,  Frequency (Times/Week): 7, Weekly Exercise (Minutes/Week): 420, Intensity: Mild  Goals      Patient Stated   . HEMOGLOBIN A1C < 7 (pt-stated)     Maintain levels       Fall Risk Fall Risk  11/20/2018 06/03/2018 02/19/2018 10/30/2017 12/04/2016  Falls in the past year? 0 0 No No No  Number falls in past yr: - 0 - - -  Injury with Fall? - 0 - - -     Depression Screen PHQ 2/9 Scores 11/20/2018 06/03/2018 06/03/2018 02/19/2018  PHQ - 2 Score 0 2 2 0  PHQ- 9 Score - 9 - -     Cognitive Function     6CIT Screen 11/20/2018 10/30/2017  What Year? 0 points 0 points  What month? 0 points 0 points  What time? 0 points 0 points  Count back from 20 0 points 0 points  Months in reverse 0 points 0 points  Repeat phrase 0 points 0 points  Total Score 0 0    Immunization History  Administered Date(s) Administered  . Influenza, High Dose Seasonal PF 02/19/2018  . Pneumococcal Conjugate-13 10/30/2017  . Pneumococcal Polysaccharide-23 10/02/2016   Screening Tests Health Maintenance  Topic Date Due  . TETANUS/TDAP  07/21/1964  . FOOT EXAM  07/31/2018  . INFLUENZA VACCINE  12/20/2018  . URINE MICROALBUMIN  02/20/2019  . HEMOGLOBIN A1C  04/23/2019  . OPHTHALMOLOGY EXAM  11/12/2019  . Fecal DNA (Cologuard)  11/15/2019  . MAMMOGRAM  11/09/2020  . DEXA SCAN  Completed  . PNA vac Low Risk Adult  Completed      Plan:    End of life planning; Advance aging; Advanced directives discussed.  Copy of current DNR on file.    I have personally reviewed and noted the following in the patient's chart:   . Medical and social history . Use of alcohol, tobacco or illicit drugs  . Current medications and supplements . Functional ability and status . Nutritional status .  Physical activity . Advanced directives . List of other physicians . Hospitalizations, surgeries, and ER visits in previous 12 months . Vitals . Screenings to include cognitive, depression, and falls . Referrals and appointments  In addition, I have reviewed and discussed with patient certain preventive protocols, quality metrics, and best practice recommendations. A written personalized care plan for preventive services as well as general preventive health recommendations were provided to patient.     Varney Biles, LPN  12/25/2822

## 2018-11-21 NOTE — Progress Notes (Signed)
I have reviewed the above note and agree.  Eric Sonnenberg, M.D.  

## 2018-11-24 NOTE — Telephone Encounter (Signed)
Called patient.  Patient confirmed that she received her lab results and already has a lab appt scheduled on 12/10/18.

## 2018-12-02 NOTE — Progress Notes (Signed)
Abstract entry, diabetic retinopathy.  Nina,cma 

## 2018-12-04 ENCOUNTER — Encounter: Payer: Self-pay | Admitting: Family Medicine

## 2018-12-04 DIAGNOSIS — E11319 Type 2 diabetes mellitus with unspecified diabetic retinopathy without macular edema: Secondary | ICD-10-CM | POA: Insufficient documentation

## 2018-12-10 ENCOUNTER — Other Ambulatory Visit (INDEPENDENT_AMBULATORY_CARE_PROVIDER_SITE_OTHER): Payer: Medicare HMO

## 2018-12-10 ENCOUNTER — Other Ambulatory Visit: Payer: Self-pay

## 2018-12-10 DIAGNOSIS — D649 Anemia, unspecified: Secondary | ICD-10-CM | POA: Diagnosis not present

## 2018-12-10 DIAGNOSIS — N179 Acute kidney failure, unspecified: Secondary | ICD-10-CM | POA: Diagnosis not present

## 2018-12-10 LAB — BASIC METABOLIC PANEL
BUN: 28 mg/dL — ABNORMAL HIGH (ref 6–23)
CO2: 22 mEq/L (ref 19–32)
Calcium: 9.6 mg/dL (ref 8.4–10.5)
Chloride: 105 mEq/L (ref 96–112)
Creatinine, Ser: 1.25 mg/dL — ABNORMAL HIGH (ref 0.40–1.20)
GFR: 50.64 mL/min — ABNORMAL LOW (ref >60.00)
Glucose, Bld: 140 mg/dL — ABNORMAL HIGH (ref 70–99)
Potassium: 4.8 mEq/L (ref 3.5–5.1)
Sodium: 135 mEq/L (ref 135–145)

## 2018-12-10 LAB — IBC + FERRITIN
Ferritin: 31.3 ng/mL (ref 10.0–291.0)
Iron: 96 ug/dL (ref 42–145)
Saturation Ratios: 26.7 % (ref 20.0–50.0)
Transferrin: 257 mg/dL (ref 212.0–360.0)

## 2018-12-10 LAB — FOLATE: Folate: >23.9 ng/mL (ref >5.9)

## 2018-12-10 LAB — VITAMIN B12: Vitamin B-12: 1455 pg/mL — ABNORMAL HIGH (ref 211–911)

## 2018-12-24 ENCOUNTER — Telehealth: Payer: Self-pay | Admitting: Family Medicine

## 2018-12-24 NOTE — Telephone Encounter (Signed)
Pt is having problems taking the metFORMIN (GLUCOPHAGE) 500 MG tablet. Pt would like to take something office.

## 2018-12-25 NOTE — Telephone Encounter (Signed)
We could try her on tradjenta if she is ok making that change. I can send it in once you talk to her.

## 2018-12-25 NOTE — Telephone Encounter (Signed)
Patient daughter says she is really having trouble with the metformin diarrhea and stomach upset.  Can she take something.

## 2018-12-26 MED ORDER — LINAGLIPTIN 5 MG PO TABS: 5.0000 mg | ORAL_TABLET | Freq: Every day | ORAL | 1 refills | Status: DC

## 2018-12-26 NOTE — Telephone Encounter (Signed)
Tradjenta sent to pharmacy. She can discontinue the metformin. If the tradjenta is too expensive she should let us know.

## 2018-12-26 NOTE — Telephone Encounter (Signed)
Patient is willing to try tradjenta.

## 2018-12-26 NOTE — Telephone Encounter (Signed)
Patient daughter aware. °

## 2019-01-02 ENCOUNTER — Telehealth: Payer: Self-pay | Admitting: Family Medicine

## 2019-01-02 ENCOUNTER — Other Ambulatory Visit: Payer: Self-pay | Admitting: Family Medicine

## 2019-01-02 MED ORDER — ALENDRONATE SODIUM 70 MG PO TABS: ORAL_TABLET | ORAL | 0 refills | Status: DC

## 2019-01-02 NOTE — Telephone Encounter (Signed)
Medication: alendronate (FOSAMAX) 70 MG tablet     Patient is requesting refill.     Pharmacy:  [facility] [address] [place] - [place] [phone] (Phone) [phone] (Fax)

## 2019-01-02 NOTE — Telephone Encounter (Signed)
Refill sent.

## 2019-01-06 ENCOUNTER — Other Ambulatory Visit: Payer: Self-pay | Admitting: Family Medicine

## 2019-02-10 ENCOUNTER — Other Ambulatory Visit: Payer: Self-pay

## 2019-02-10 ENCOUNTER — Ambulatory Visit (INDEPENDENT_AMBULATORY_CARE_PROVIDER_SITE_OTHER): Payer: Medicare HMO | Admitting: Family Medicine

## 2019-02-10 ENCOUNTER — Encounter: Payer: Self-pay | Admitting: Family Medicine

## 2019-02-10 VITALS — BP 132/69 | HR 64 | Temp 97.6°F | Ht 63.0 in | Wt 143.4 lb

## 2019-02-10 DIAGNOSIS — E118 Type 2 diabetes mellitus with unspecified complications: Secondary | ICD-10-CM | POA: Diagnosis not present

## 2019-02-10 DIAGNOSIS — D649 Anemia, unspecified: Secondary | ICD-10-CM | POA: Diagnosis not present

## 2019-02-10 DIAGNOSIS — E782 Mixed hyperlipidemia: Secondary | ICD-10-CM

## 2019-02-10 DIAGNOSIS — I10 Essential (primary) hypertension: Secondary | ICD-10-CM

## 2019-02-10 DIAGNOSIS — R748 Abnormal levels of other serum enzymes: Secondary | ICD-10-CM

## 2019-02-10 MED ORDER — METFORMIN HCL 500 MG PO TABS: 500.0000 mg | ORAL_TABLET | Freq: Every day | ORAL | 0 refills | Status: DC

## 2019-02-10 NOTE — Assessment & Plan Note (Signed)
Well controlled. Continue current meds

## 2019-02-10 NOTE — Assessment & Plan Note (Signed)
Continue lipitor. Last lipid panel was well controlled.

## 2019-02-10 NOTE — Addendum Note (Signed)
Addended by: [name] on: 02/10/2019 11:34 AM   Modules accepted: Orders

## 2019-02-10 NOTE — Assessment & Plan Note (Signed)
Plan to recheck cbc, b12 and iron levels. If abnormal she may need to see a specialist.

## 2019-02-10 NOTE — Assessment & Plan Note (Signed)
Well controlled. We will refer to CCM and pharmacist for help in medication assistance and management as she has been unable to afford alternatives to metformin. She will continue metformin for now.

## 2019-02-10 NOTE — Progress Notes (Signed)
Virtual Visit via video Note  This visit type was conducted due to national recommendations for restrictions regarding the COVID-19 pandemic (e.g. social distancing).  This format is felt to be most appropriate for this patient at this time.  All issues noted in this document were discussed and addressed.  No physical exam was performed (except for noted visual exam findings with Video Visits).   I connected with Crystal Miller today at 11:00 AM EDT by a video enabled telemedicine application  and verified that I am speaking with the correct person using two identifiers. Location patient: home Location provider: work  Persons participating in the virtual visit: patient, provider  I discussed the limitations, risks, security and privacy concerns of performing an evaluation and management service by telephone and the availability of in person appointments. I also discussed with the patient that there may be a patient responsible charge related to this service. The patient expressed understanding and agreed to proceed.   Reason for visit: follow-up  HPI: HYPERTENSION Disease Monitoring: Blood pressure range-120/60s Chest pain- no      Dyspnea- no Medications: Compliance- taking amlodpine   Edema- no  DIABETES Disease Monitoring: Blood Sugar ranges-130s-150, excursion to 167 Polyuria/phagia/dipsia- no      Optho- UTD Medications: Compliance- taking metformin 500 mg daily. Has GI upset with this. Could not afford tradjenta. Hypoglycemic symptoms- no  HYPERLIPIDEMIA Disease Monitoring: See symptoms for Hypertension Medications: Compliance- taking lipitor Right upper quadrant pain- no  Muscle aches- no  Anemia: no BRBPR, melena, hematuria, or vaginal bleeding. Normal iron studies and folate. B12 elevated.       ROS: See pertinent positives and negatives per HPI.  Past Medical History:  Diagnosis Date  . Acute pyelonephritis 08/01/2018  . Depression    husband died in July 13, 2015  .  DM (diabetes mellitus), type 2 with complications ([place]) 6/50/3546  . Essential hypertension 11/01/2016  . GERD (gastroesophageal reflux disease)   . Mixed hyperlipidemia 11/01/2016  . Peripheral vascular disease ([place])   . Renal insufficiency   . Stroke ([place]) 2016/07/12   slight residual with speech. does not drive Lt side weakness    Past Surgical History:  Procedure Laterality Date  . CHOLECYSTECTOMY    . LOWER EXTREMITY ANGIOGRAPHY Left 12/31/2017   Procedure: LOWER EXTREMITY ANGIOGRAPHY;  Surgeon: Katha Cabal, MD;  Location: [facility];  Service: Cardiovascular;  Laterality: Left;  . LOWER EXTREMITY ANGIOGRAPHY Right 04/22/2018   Procedure: LOWER EXTREMITY ANGIOGRAPHY;  Surgeon: Katha Cabal, MD;  Location: [facility];  Service: Cardiovascular;  Laterality: Right;    Family History  Problem Relation Age of Onset  . Alzheimer's disease Mother   . Arthritis Mother   . Hypertension Mother   . CVA Father   . CAD Father   . Heart disease Father   . Stroke Father   . Hypertension Father   . Stroke Brother   . Breast cancer Neg Hx     SOCIAL HX: former smoker   Current Outpatient Medications:  .  acetaminophen (TYLENOL) 500 MG tablet, Take 1,000 mg by mouth every 6 (six) hours as needed (for pain.)., Disp: , Rfl:  .  alendronate (FOSAMAX) 70 MG tablet, TAKE 1 TABLET BY MOUTH EVERY 7 DAYS. TAKE WITH A FULL GLASS OF WATER ON AN EMPTY STOMACH., Disp: 12 tablet, Rfl: 0 .  amLODipine (NORVASC) 10 MG tablet, Take 1 tablet by mouth once daily, Disp: 90 tablet, Rfl: 0 .  aspirin EC 81  MG EC tablet, Take 1 tablet (81 mg total) by mouth daily. (Patient taking differently: Take 81 mg by mouth daily. In the morning), Disp: 30 tablet, Rfl: 2 .  atorvastatin (LIPITOR) 40 MG tablet, TAKE 1 TABLET BY MOUTH ONCE DAILY AT 6PM, Disp: 90 tablet, Rfl: 2 .  blood glucose meter kit and supplies KIT, Dispense based on patient and insurance preference. Use up to four times daily  as directed. (FOR ICD-9 250.00, 250.01)., Disp: 1 each, Rfl: 0 .  carvedilol (COREG) 6.25 MG tablet, Take 1 tablet (6.25 mg total) by mouth 2 (two) times daily with a meal., Disp: 180 tablet, Rfl: 3 .  Multiple Vitamin (MULTIVITAMIN WITH MINERALS) TABS tablet, Take 1 tablet by mouth daily. Centrum Silver . In the morning, Disp: , Rfl:  .  Polyethyl Glycol-Propyl Glycol (LUBRICANT EYE DROPS) 0.4-0.3 % SOLN, Place 1 drop into both eyes 3 (three) times daily as needed (for dry eyes.)., Disp: , Rfl:  .  sertraline (ZOLOFT) 50 MG tablet, Take 0.5 tablets (25 mg total) by mouth daily., Disp: 45 tablet, Rfl: 3 .  solifenacin (VESICARE) 5 MG tablet, Take 1 tablet (5 mg total) by mouth daily., Disp: 30 tablet, Rfl: 1 .  vitamin B-12 (CYANOCOBALAMIN) 1000 MCG tablet, Take 1,000 mcg by mouth daily. , Disp: , Rfl:  .  clopidogrel (PLAVIX) 75 MG tablet, Take 1 tablet (75 mg total) by mouth daily. (Patient not taking: Reported on 02/10/2019), Disp: 30 tablet, Rfl: 11 .  metFORMIN (GLUCOPHAGE) 500 MG tablet, Take 1 tablet (500 mg total) by mouth daily with breakfast., Disp: 30 tablet, Rfl: 0  EXAM:  VITALS per patient if applicable: none  GENERAL: alert, oriented, appears well and in no acute distress  HEENT: atraumatic, conjunttiva clear, no obvious abnormalities on inspection of external nose and ears  NECK: normal movements of the head and neck  LUNGS: on inspection no signs of respiratory distress, breathing rate appears normal, no obvious gross SOB, gasping or wheezing  CV: no obvious cyanosis  MS: moves all visible extremities without noticeable abnormality  PSYCH/NEURO: pleasant and cooperative, no obvious depression or anxiety, speech and thought processing grossly intact  ASSESSMENT AND PLAN:  Discussed the following assessment and plan:  Essential hypertension Well controlled. Continue current meds.   DM (diabetes mellitus), type 2 with complications ([place]) Well controlled. We will refer  to CCM and pharmacist for help in medication assistance and management as she has been unable to afford alternatives to metformin. She will continue metformin for now.   Mixed hyperlipidemia Continue lipitor. Last lipid panel was well controlled.   Anemia Plan to recheck cbc, b12 and iron levels. If abnormal she may need to see a specialist.   Social distancing precautions and sick precautions given regarding COVID19.    I discussed the assessment and treatment plan with the patient. The patient was provided an opportunity to ask questions and all were answered. The patient agreed with the plan and demonstrated an understanding of the instructions.   The patient was advised to call back or seek an in-person evaluation if the symptoms worsen or if the condition fails to improve as anticipated.    Tommi Rumps, MD

## 2019-02-19 ENCOUNTER — Ambulatory Visit (INDEPENDENT_AMBULATORY_CARE_PROVIDER_SITE_OTHER): Payer: Medicare HMO

## 2019-02-19 ENCOUNTER — Other Ambulatory Visit: Payer: Self-pay

## 2019-02-19 ENCOUNTER — Encounter (INDEPENDENT_AMBULATORY_CARE_PROVIDER_SITE_OTHER): Payer: Self-pay | Admitting: Vascular Surgery

## 2019-02-19 ENCOUNTER — Ambulatory Visit (INDEPENDENT_AMBULATORY_CARE_PROVIDER_SITE_OTHER): Payer: Medicare HMO | Admitting: Vascular Surgery

## 2019-02-19 VITALS — BP 137/71 | HR 68 | Resp 16 | Wt 143.0 lb

## 2019-02-19 DIAGNOSIS — I739 Peripheral vascular disease, unspecified: Secondary | ICD-10-CM

## 2019-02-19 DIAGNOSIS — E782 Mixed hyperlipidemia: Secondary | ICD-10-CM

## 2019-02-19 DIAGNOSIS — I10 Essential (primary) hypertension: Secondary | ICD-10-CM | POA: Diagnosis not present

## 2019-02-19 DIAGNOSIS — E118 Type 2 diabetes mellitus with unspecified complications: Secondary | ICD-10-CM | POA: Diagnosis not present

## 2019-02-24 ENCOUNTER — Encounter (INDEPENDENT_AMBULATORY_CARE_PROVIDER_SITE_OTHER): Payer: Self-pay | Admitting: Vascular Surgery

## 2019-02-24 NOTE — Progress Notes (Signed)
MRN : [id number]  Crystal Miller is a 73 y.o. (Apr 05, 1946) female who presents with chief complaint of  Chief Complaint  Patient presents with   Follow-up    ultrasound follow up  .  History of Present Illness:   The patient returns to the office for followup and review status post angiogram with interventionon 04/22/2018.  1.Percutaneous transluminal angioplastyrightsuperficial femoral and popliteal arteries to 5 mm distally and 6 mm at the origin of the SFA 2.Percutaneous transluminal angioplastyright posterior tibial to 2 mm in its proximal two thirds and then 3 mm from the origin into the tibioperoneal trunk 3.Percutaneous transluminal angioplasty of the right profunda femoris to 4 mm proximally  The patient notes improvement in the lower extremity symptoms. No interval shortening of the patient's claudication distance or rest pain symptoms. Previous wounds have now healed. No new ulcers or wounds have occurred since the last visit.  There have been no significant changes to the patient's overall health care.  The patient denies amaurosis fugax or recent TIA symptoms. There are no recent neurological changes noted. The patient denies history of DVT, PE or superficial thrombophlebitis. The patient denies recent episodes of angina or shortness of breath.   ABI's Rt=1.07and Lt=1.08(previous ABI's Rt=1.14and Lt=1.08)  Current Meds  Medication Sig   acetaminophen (TYLENOL) 500 MG tablet Take 1,000 mg by mouth every 6 (six) hours as needed (for pain.).   alendronate (FOSAMAX) 70 MG tablet TAKE 1 TABLET BY MOUTH EVERY 7 DAYS. TAKE WITH A FULL GLASS OF WATER ON AN EMPTY STOMACH.   amLODipine (NORVASC) 10 MG tablet Take 1 tablet by mouth once daily   aspirin EC 81 MG EC tablet Take 1 tablet (81 mg total) by mouth daily. (Patient taking differently: Take 81 mg by mouth daily. In the morning)   atorvastatin (LIPITOR) 40 MG tablet TAKE 1 TABLET BY MOUTH ONCE DAILY  AT 6PM   blood glucose meter kit and supplies KIT Dispense based on patient and insurance preference. Use up to four times daily as directed. (FOR ICD-9 250.00, 250.01).   carvedilol (COREG) 6.25 MG tablet Take 1 tablet (6.25 mg total) by mouth 2 (two) times daily with a meal.   metFORMIN (GLUCOPHAGE) 500 MG tablet Take 1 tablet (500 mg total) by mouth daily with breakfast.   Multiple Vitamin (MULTIVITAMIN WITH MINERALS) TABS tablet Take 1 tablet by mouth daily. Centrum Silver . In the morning   sertraline (ZOLOFT) 50 MG tablet Take 0.5 tablets (25 mg total) by mouth daily.   solifenacin (VESICARE) 5 MG tablet Take 1 tablet (5 mg total) by mouth daily.   vitamin B-12 (CYANOCOBALAMIN) 1000 MCG tablet Take 1,000 mcg by mouth daily.     Past Medical History:  Diagnosis Date   Acute pyelonephritis 08-04-2018   Depression    husband died in Jul 03, 2015   DM (diabetes mellitus), type 2 with complications ([place]) 09/28/2583   Essential hypertension 11/01/2016   GERD (gastroesophageal reflux disease)    Mixed hyperlipidemia 11/01/2016   Peripheral vascular disease ([place])    Renal insufficiency    Stroke ([place]) 2016-07-02   slight residual with speech. does not drive Lt side weakness    Past Surgical History:  Procedure Laterality Date   CHOLECYSTECTOMY     LOWER EXTREMITY ANGIOGRAPHY Left 12/31/2017   Procedure: LOWER EXTREMITY ANGIOGRAPHY;  Surgeon: Katha Cabal, MD;  Location: [facility];  Service: Cardiovascular;  Laterality: Left;   LOWER EXTREMITY ANGIOGRAPHY Right 04/22/2018   Procedure:  LOWER EXTREMITY ANGIOGRAPHY;  Surgeon: Katha Cabal, MD;  Location: [facility];  Service: Cardiovascular;  Laterality: Right;    Social History Social History   Tobacco Use   Smoking status: Former Smoker    Packs/day: 1.00    Years: 20.00    Pack years: 20.00    Types: Cigarettes    Quit date: 2012    Years since quitting: 8.7   Smokeless tobacco: Never Used    Substance Use Topics   Alcohol use: No   Drug use: No    Family History Family History  Problem Relation Age of Onset   Alzheimer's disease Mother    Arthritis Mother    Hypertension Mother    CVA Father    CAD Father    Heart disease Father    Stroke Father    Hypertension Father    Stroke Brother    Breast cancer Neg Hx     Allergies  Allergen Reactions   Metformin And Related Diarrhea     REVIEW OF SYSTEMS (Negative unless checked)  Constitutional: _0 Weight loss  _1 Fever  _2 Chills Cardiac: _3 Chest pain   _4 Chest pressure   _5 Palpitations   _6 Shortness of breath when laying flat   _7 Shortness of breath with exertion. Vascular:  _8 Pain in legs with walking   _9 Pain in legs at rest  _10 History of DVT   _11 Phlebitis   _12 Swelling in legs   _13 Varicose veins   _14 Non-healing ulcers Pulmonary:   _15 Uses home oxygen   _16 Productive cough   _17 Hemoptysis   _18 Wheeze  _19 COPD   _20 Asthma Neurologic:  _21 Dizziness   _22 Seizures   _23 History of stroke   _24 History of TIA  _25 Aphasia   _26 Vissual changes   _27 Weakness or numbness in arm   _28 Weakness or numbness in leg Musculoskeletal:   _29 Joint swelling   _30 Joint pain   _31 Low back pain Hematologic:  _32 Easy bruising  _33 Easy bleeding   _34 Hypercoagulable state   _35 Anemic Gastrointestinal:  _36 Diarrhea   _37 Vomiting  _38 Gastroesophageal reflux/heartburn   _39 Difficulty swallowing. Genitourinary:  _40 Chronic kidney disease   _41 Difficult urination  _42 Frequent urination   _43 Blood in urine Skin:  _44 Rashes   _45 Ulcers  Psychological:  _46 History of anxiety   _47  History of major depression.  Physical Examination  Vitals:   02/19/19 1521  BP: 137/71  Pulse: 68  Resp: 16  Weight: 143 lb (64.9 kg)   Body mass index is 25.33 kg/m. Gen: WD/WN, NAD Head: [place]/AT, No temporalis wasting.  Ear/Nose/Throat: Hearing grossly intact, nares w/o erythema or drainage Eyes: PER, EOMI, sclera nonicteric.  Neck: Supple, no large masses.   Pulmonary:  Good  air movement, no audible wheezing bilaterally, no use of accessory muscles.  Cardiac: RRR, no JVD Vascular:  Vessel Right Left  Radial Palpable Palpable  PT Not Palpable Not Palpable  DP Not Palpable Not Palpable  Gastrointestinal: Non-distended. No guarding/no peritoneal signs.  Musculoskeletal: M/S 5/5 throughout.  No deformity or atrophy.  Neurologic: CN 2-12 intact. Symmetrical.  Speech is fluent. Motor exam as listed above. Psychiatric: Judgment intact, Mood & affect appropriate for pt's clinical situation. Dermatologic: No rashes or ulcers noted.  No changes consistent with cellulitis. Lymph : No lichenification or skin changes of chronic lymphedema.  CBC Lab Results  Component Value Date   WBC 7.2 10/22/2018   HGB 11.6 (L) 10/22/2018   HCT 33.4 (L) 10/22/2018   MCV 90.1 10/22/2018   PLT 288.0 10/22/2018    BMET    Component Value Date/Time  NA 135 12/10/2018 0950   K 4.8 12/10/2018 0950   CL 105 12/10/2018 0950   CO2 22 12/10/2018 0950   GLUCOSE 140 (H) 12/10/2018 0950   BUN 28 (H) 12/10/2018 0950   CREATININE 1.25 (H) 12/10/2018 0950   CALCIUM 9.6 12/10/2018 0950   GFRNONAA 48 (L) 08/02/2018 0422   GFRAA 56 (L) 08/02/2018 0422   CrCl cannot be calculated (Patient's most recent lab result is older than the maximum 21 days allowed.).  COAG No results found for: INR, PROTIME  Radiology Vas [place] Burnard Bunting With/wo Tbi  Result Date: 02/19/2019 LOWER EXTREMITY DOPPLER STUDY Indications: Rest pain, and peripheral artery disease.  Vascular Interventions: 12/31/2017 PTA of the SFA and popliteal artery, Fem/pop                         stent, bilateral CIA stents.                          04/22/2018 PTA: Rt SFA , Popliteal Artery, PFA, Poterior                         Tibial Artery and Tibial Peroneal Trunk. Comparison Study: 08/18/2018 Performing Technologist: Concha Norway RVT  Examination Guidelines: A complete evaluation includes at minimum, Doppler waveform signals and systolic  blood pressure reading at the level of bilateral brachial, anterior tibial, and posterior tibial arteries, when vessel segments are accessible. Bilateral testing is considered an integral part of a complete examination. Photoelectric Plethysmograph (PPG) waveforms and toe systolic pressure readings are included as required and additional duplex testing as needed. Limited examinations for reoccurring indications may be performed as noted.  ABI Findings: +---------+------------------+-----+----------+--------+  Right     Rt Pressure (mmHg) Index Waveform   Comment   +---------+------------------+-----+----------+--------+  Brachial  137                                           +---------+------------------+-----+----------+--------+  ATA       129                0.94  biphasic             +---------+------------------+-----+----------+--------+  PTA       146                1.07  monophasic           +---------+------------------+-----+----------+--------+  Great Toe 101                0.74  Normal               +---------+------------------+-----+----------+--------+ +---------+------------------+-----+----------+-------+  Left      Lt Pressure (mmHg) Index Waveform   Comment  +---------+------------------+-----+----------+-------+  Brachial  135                                          +---------+------------------+-----+----------+-------+  ATA       147                1.07  monophasic          +---------+------------------+-----+----------+-------+  PTA       148  1.08  biphasic            +---------+------------------+-----+----------+-------+  Great Toe 81                 0.59  Normal              +---------+------------------+-----+----------+-------+ +-------+-----------+-----------+------------+------------+  ABI/TBI Today's ABI Today's TBI Previous ABI Previous TBI  +-------+-----------+-----------+------------+------------+  Right   1.07        .74         1.14         .57            +-------+-----------+-----------+------------+------------+  Left    1.08        .59         1.08         .46           +-------+-----------+-----------+------------+------------+ Bilateral ABIs appear essentially unchanged compared to prior study on 08/18/2018.  Summary: Right: Resting right ankle-brachial index is within normal range. No evidence of significant right lower extremity arterial disease. The right toe-brachial index is normal. Left: Resting left ankle-brachial index is within normal range. No evidence of significant left lower extremity arterial disease. The left toe-brachial index is abnormal.  *See table(s) above for measurements and observations.  Electronically signed by Hortencia Pilar MD on 02/19/2019 at 4:55:34 PM.    Final    Vas [place] Lower Extremity Arterial Duplex  Result Date: 02/19/2019 LOWER EXTREMITY ARTERIAL DUPLEX STUDY Indications: Peripheral artery disease.  Vascular Interventions: 05/19/2018* PTA Rt SFA,Pop, PFA, PTA and TP trunk                         01/17/2018 Bilat CIA PTA and stents. Current ABI:            rt = 1.07 ; Lt = 1.08 Comparison Study: 08/18/2018 Performing Technologist: Concha Norway RVT  Examination Guidelines: A complete evaluation includes B-mode imaging, spectral Doppler, color Doppler, and power Doppler as needed of all accessible portions of each vessel. Bilateral testing is considered an integral part of a complete examination. Limited examinations for reoccurring indications may be performed as noted.  +----------+--------+-----+--------+----------+--------+  RIGHT      PSV cm/s Ratio Stenosis Waveform   Comments  +----------+--------+-----+--------+----------+--------+  CFA Prox   109                     triphasic            +----------+--------+-----+--------+----------+--------+  DFA        181                     biphasic             +----------+--------+-----+--------+----------+--------+  SFA Prox   94                      biphasic              +----------+--------+-----+--------+----------+--------+  SFA Mid    83                      biphasic             +----------+--------+-----+--------+----------+--------+  SFA Distal 91                      biphasic             +----------+--------+-----+--------+----------+--------+  POP Distal 163                     monophasic           +----------+--------+-----+--------+----------+--------+  ATA Distal 53                      monophasic           +----------+--------+-----+--------+----------+--------+  PTA Distal 82                      monophasic           +----------+--------+-----+--------+----------+--------+  +----------+--------+-----+--------+----------+--------+  LEFT       PSV cm/s Ratio Stenosis Waveform   Comments  +----------+--------+-----+--------+----------+--------+  CFA Mid    182                     biphasic             +----------+--------+-----+--------+----------+--------+  DFA        66                      biphasic             +----------+--------+-----+--------+----------+--------+  SFA Prox   120                     monophasic           +----------+--------+-----+--------+----------+--------+  SFA Mid    107                     biphasic             +----------+--------+-----+--------+----------+--------+  SFA Distal 74                      biphasic             +----------+--------+-----+--------+----------+--------+  POP Distal 201                     biphasic             +----------+--------+-----+--------+----------+--------+  ATA Distal 69                      monophasic           +----------+--------+-----+--------+----------+--------+  PTA Distal 96                      monophasic           +----------+--------+-----+--------+----------+--------+  Summary: Right: Moderate atherosclerosis throughout. NL ABI. Left: Moderate atherosclerosis throughout. NL ABI.  See table(s) above for measurements and observations. Electronically signed by Hortencia Pilar MD on 02/19/2019 at 4:55:30  PM.    Final       Assessment/Plan 1. PAD (peripheral artery disease) (HCC)  Recommend:  The patient has evidence of atherosclerosis of the lower extremities with claudication.  The patient does not voice lifestyle limiting changes at this point in time.  Noninvasive studies do not suggest clinically significant change.  No invasive studies, angiography or surgery at this time The patient should continue walking and begin a more formal exercise program.  The patient should continue antiplatelet therapy and aggressive treatment of the lipid abnormalities  No changes in the patient's medications at this time  The patient should continue wearing graduated compression socks 10-15 mmHg strength to control the mild edema.   -  VAS [place] LOWER EXTREMITY ARTERIAL DUPLEX; Future - VAS [place] ABI WITH/WO TBI; Future  2. Essential hypertension Continue antihypertensive medications as already ordered, these medications have been reviewed and there are no changes at this time.   3. DM (diabetes mellitus), type 2 with complications ([place]) Continue hypoglycemic medications as already ordered, these medications have been reviewed and there are no changes at this time.  Hgb A1C to be monitored as already arranged by primary service   4. Mixed hyperlipidemia Continue statin as ordered and reviewed, no changes at this time    Hortencia Pilar, MD  02/24/2019 9:34 AM

## 2019-02-26 ENCOUNTER — Other Ambulatory Visit: Payer: Self-pay

## 2019-02-26 ENCOUNTER — Other Ambulatory Visit (INDEPENDENT_AMBULATORY_CARE_PROVIDER_SITE_OTHER): Payer: Medicare HMO

## 2019-02-26 ENCOUNTER — Ambulatory Visit (INDEPENDENT_AMBULATORY_CARE_PROVIDER_SITE_OTHER): Payer: Medicare HMO | Admitting: Pharmacist

## 2019-02-26 DIAGNOSIS — E118 Type 2 diabetes mellitus with unspecified complications: Secondary | ICD-10-CM

## 2019-02-26 DIAGNOSIS — R748 Abnormal levels of other serum enzymes: Secondary | ICD-10-CM

## 2019-02-26 DIAGNOSIS — Z23 Encounter for immunization: Secondary | ICD-10-CM

## 2019-02-26 DIAGNOSIS — D649 Anemia, unspecified: Secondary | ICD-10-CM

## 2019-02-26 LAB — VITAMIN B12: Vitamin B-12: >1500 pg/mL — ABNORMAL HIGH (ref 211–911)

## 2019-02-26 LAB — IBC + FERRITIN
Ferritin: 45.8 ng/mL (ref 10.0–291.0)
Iron: 90 ug/dL (ref 42–145)
Saturation Ratios: 25.7 % (ref 20.0–50.0)
Transferrin: 250 mg/dL (ref 212.0–360.0)

## 2019-02-26 LAB — MICROALBUMIN / CREATININE URINE RATIO
Creatinine,U: 133.7 mg/dL
Microalb Creat Ratio: 10.1 mg/g (ref 0.0–30.0)
Microalb, Ur: 13.5 mg/dL — ABNORMAL HIGH (ref 0.0–1.9)

## 2019-02-26 LAB — HEMOGLOBIN A1C: Hgb A1c MFr Bld: 6.8 % — ABNORMAL HIGH (ref 4.6–6.5)

## 2019-02-26 MED ORDER — LINAGLIPTIN 5 MG PO TABS: 5.0000 mg | ORAL_TABLET | Freq: Every day | ORAL | 2 refills | Status: DC

## 2019-02-26 NOTE — Patient Instructions (Signed)
Visit Information  Goals Addressed            This Visit's Progress     Patient Stated   . "I want to work on my diabetes" (pt-stated)       Current Barriers:  . Diabetes: not optimally managed; most recent A1c 6.6%   o Reports significant diarrhea, often causing accidents, when on metformin therapy o Was prescribed Tradjenta as replacement, but was too expensive. She does not remember the cost . Current antihyperglycemic regimen: metformin 500 mg QAM . 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 Current blood glucose readings: Fasting this morning 161; sometimes 122; generally <140  . Cardiovascular risk reduction: o Current hypertensive regimen: carvedilol 6.25 mg BID, amlodipine 10 mg daily o Current hyperlipidemia regimen: atorvastatin 40 mg daily o Current antiplatelet regimen: ASA 81 mg daily; pt reports she had been instructed by Dr. Delana Meyer to d/c clopidogrel at March appointment, however, never d/c on her medication list  Pharmacist Clinical Goal(s):  Marland Kitchen Over the next 90 days, patient will work with PharmD and primary care provider to address optimized medication management  Interventions: . Comprehensive medication review performed, medication list updated in electronic medical record . Reviewed patient's income. She meets criteria for Medicare Extra Help. With her permission, assisted patient in completing online application. She is aware that decision will come in the next 3-6 weeks in the mail, and will let me know when it arrives . In the meantime, will pursue patient assistance for Tradjenta through [employer]. Will collaborate with patient and provider on application. Patient's daughter will come pick up the application, take back to patient, and bring back signed w/ a bank statement to submit as proof of income from [place] . Will message Dr. Delana Meyer to confirm d/c of clopidogrel prior to removing from her medication list . If patient is denied for  Extra Help, will pursue alternative options to solifenacin that are more affordable  Patient Self Care Activities:  . Patient will check blood glucose daily , document, and provide at future appointments . Patient will take medications as prescribed . Patient will report any questions or concerns to provider   Initial goal documentation        Ms. Vanderford was given information about Chronic Care Management services today including:  1. CCM service includes personalized support from designated clinical staff supervised by her physician, including individualized plan of care and coordination with other care providers 2. 24/7 contact phone numbers for assistance for urgent and routine care needs. 3. Service will only be billed when office clinical staff spend 20 minutes or more in a month to coordinate care. 4. Only one practitioner may furnish and bill the service in a calendar month. 5. The patient may stop CCM services at any time (effective at the end of the month) by phone call to the office staff. 6. The patient will be responsible for cost sharing (co-pay) of up to 20% of the service fee (after annual deductible is met).  Patient agreed to services and verbal consent obtained.   The patient verbalized understanding of instructions provided today and declined a print copy of patient instruction materials.   Plan: - Will collaborate w/ patient and provider as above  [name], PharmD, [place] Pharmacist [facility] [place] [phone]

## 2019-02-26 NOTE — Chronic Care Management (AMB) (Signed)
Chronic Care Management   Note  02/26/2019 Name: Crystal Miller MRN: [id number] DOB: [birth year]   Subjective:  Crystal Miller is a 73 y.o. year old female who is a primary care patient of [name], [name], MD. The CCM team was consulted for assistance with chronic disease management and care coordination needs.    Contacted patient for medication access assistance.   Crystal Miller was given information about Chronic Care Management services today including:  1. CCM service includes personalized support from designated clinical staff supervised by her physician, including individualized plan of care and coordination with other care providers 2. 24/7 contact phone numbers for assistance for urgent and routine care needs. 3. Service will only be billed when office clinical staff spend 20 minutes or more in a month to coordinate care. 4. Only one practitioner may furnish and bill the service in a calendar month. 5. The patient may stop CCM services at any time (effective at the end of the month) by phone call to the office staff. 6. The patient will be responsible for cost sharing (co-pay) of up to 20% of the service fee (after annual deductible is met).  Patient agreed to services and verbal consent obtained.   Review of patient status, including review of consultants reports, laboratory and other test data, was performed as part of comprehensive evaluation and provision of chronic care management services.   Objective:  Lab Results  Component Value Date   CREATININE 1.25 (H) 12/10/2018   CREATININE 1.33 (H) 10/22/2018   CREATININE 1.08 08/07/2018    Lab Results  Component Value Date   HGBA1C 6.6 (H) 10/22/2018       Component Value Date/Time   CHOL 86 10/22/2018 0941   TRIG 109.0 10/22/2018 0941   HDL 33.10 (L) 10/22/2018 0941   CHOLHDL 3 10/22/2018 0941   VLDL 21.8 10/22/2018 0941   LDLCALC 32 10/22/2018 0941   LDLDIRECT 45.0 02/19/2018 1014    Clinical ASCVD: Yes  The  ASCVD Risk score (Goff DC Jr., et al., 2013) failed to calculate for the following reasons:   The patient has a prior MI or stroke diagnosis    BP Readings from Last 3 Encounters:  02/19/19 137/71  02/10/19 132/69  08/18/18 (!) 145/78    Allergies  Allergen Reactions  . Metformin And Related Diarrhea    Medications Reviewed Today    Reviewed by De Hollingshead, [facility] (Pharmacist) on 02/26/19 at 1141  Med List Status: <None>  Medication Order Taking? Sig Documenting Provider Last Dose Status Informant  acetaminophen (TYLENOL) 500 MG tablet [id number]  Take 1,000 mg by mouth every 6 (six) hours as needed (for pain.). [provider]  Active Family Member  alendronate (FOSAMAX) 70 MG tablet [id number] Yes TAKE 1 TABLET BY MOUTH EVERY 7 DAYS. TAKE WITH A FULL GLASS OF WATER ON AN EMPTY STOMACH. [name], MD Taking Active   amLODipine (NORVASC) 10 MG tablet [id number] Yes Take 1 tablet by mouth once daily [name], MD Taking Active            Med Note Nat Christen Feb 26, 2019 11:37 AM) QAM   aspirin EC 81 MG EC tablet [id number] Yes Take 1 tablet (81 mg total) by mouth daily. Vaughan Basta, MD Taking Active Family Member  atorvastatin (LIPITOR) 40 MG tablet [id number] Yes TAKE 1 TABLET BY MOUTH ONCE DAILY AT Meredith Staggers, MD Taking Active   blood glucose meter kit and supplies  KIT [id number] Yes Dispense based on patient and insurance preference. Use up to four times daily as directed. (FOR ICD-9 250.00, 250.01). Orbie Pyo, MD Taking Active Family Member  carvedilol (COREG) 6.25 MG tablet [id number] Yes Take 1 tablet (6.25 mg total) by mouth 2 (two) times daily with a meal. [name], MD Taking Active   clopidogrel (PLAVIX) 75 MG tablet [id number] No Take 1 tablet (75 mg total) by mouth daily.  Patient not taking: Reported on 02/10/2019   Katha Cabal, MD Not Taking Active Family Member  metFORMIN  (GLUCOPHAGE) 500 MG tablet [id number] Yes Take 1 tablet (500 mg total) by mouth daily with breakfast. [name], MD Taking Active   Multiple Vitamin (MULTIVITAMIN WITH MINERALS) TABS tablet [id number] Yes Take 1 tablet by mouth daily. Centrum Silver . In the morning [provider] Taking Active Family Member  Polyethyl Glycol-Propyl Glycol (LUBRICANT EYE DROPS) 0.4-0.3 % SOLN [id number] Yes Place 1 drop into both eyes 3 (three) times daily as needed (for dry eyes.). [provider] Taking Active Family Member           Med Note Arby Barrette   Fri Aug 01, 2018 12:22 PM) Patient not taking  sertraline (ZOLOFT) 50 MG tablet [id number] Yes Take 0.5 tablets (25 mg total) by mouth daily. [name], MD Taking Active   solifenacin (VESICARE) 5 MG tablet [id number] No Take 1 tablet (5 mg total) by mouth daily.  Patient not taking: Reported on 02/26/2019   Abbie Sons, MD Not Taking Active Family Member           Med Note Dia Crawford   Thu Nov 20, 2018 12:35 PM) Patient states, "cost is not affordable."  vitamin B-12 (CYANOCOBALAMIN) 1000 MCG tablet [id number] Yes Take 1,000 mcg by mouth daily.  [provider] Taking Active Family Member           Assessment:   Goals Addressed            This Visit's Progress     Patient Stated   . "I want to work on my diabetes" (pt-stated)       Current Barriers:  . Diabetes: not optimally managed; most recent A1c 6.6%   o Reports significant diarrhea, often causing accidents, when on metformin therapy o Was prescribed Tradjenta as replacement, but was too expensive. She does not remember the cost . Current antihyperglycemic regimen: metformin 500 mg QAM . 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 Current blood glucose readings: Fasting this morning 161; sometimes 122; generally <140  . Cardiovascular risk reduction: o Current hypertensive regimen: carvedilol 6.25 mg BID,  amlodipine 10 mg daily o Current hyperlipidemia regimen: atorvastatin 40 mg daily o Current antiplatelet regimen: ASA 81 mg daily; pt reports she had been instructed by Dr. Delana Meyer to d/c clopidogrel at March appointment, however, never d/c on her medication list  Pharmacist Clinical Goal(s):  Marland Kitchen Over the next 90 days, patient will work with PharmD and primary care provider to address optimized medication management  Interventions: . Comprehensive medication review performed, medication list updated in electronic medical record . Reviewed patient's income. She meets criteria for Medicare Extra Help. With her permission, assisted patient in completing online application. She is aware that decision will come in the next 3-6 weeks in the mail, and will let me know when it arrives . In the meantime, will pursue patient assistance for Tradjenta through [employer]. Will collaborate with  patient and provider on application. Patient's daughter will come pick up the application, take back to patient, and bring back signed w/ a bank statement to submit as proof of income from [place] . Will message Dr. Delana Meyer to confirm d/c of clopidogrel prior to removing from her medication list . If patient is denied for Extra Help, will pursue alternative options to solifenacin that are more affordable  Patient Self Care Activities:  . Patient will check blood glucose daily , document, and provide at future appointments . Patient will take medications as prescribed . Patient will report any questions or concerns to provider   Initial goal documentation        Plan: - Will collaborate w/ patient and provider as above  [name], PharmD, [place] Pharmacist [place] [phone]

## 2019-02-27 LAB — CBC
HCT: 35 % — ABNORMAL LOW (ref 36.0–46.0)
Hemoglobin: 11.8 g/dL — ABNORMAL LOW (ref 12.0–15.0)
MCHC: 33.7 g/dL (ref 30.0–36.0)
MCV: 93.3 fl (ref 78.0–100.0)
Platelets: 300 10*3/uL (ref 150.0–400.0)
RBC: 3.75 Mil/uL — ABNORMAL LOW (ref 3.87–5.11)
RDW: 12.6 % (ref 11.5–15.5)
WBC: 7.5 10*3/uL (ref 4.0–10.5)

## 2019-02-27 NOTE — Progress Notes (Signed)
I have reviewed the above note and agree. I was available to the pharmacist for consultation.  Jovontae Banko, MD  

## 2019-03-04 ENCOUNTER — Ambulatory Visit: Payer: Medicare HMO

## 2019-03-13 ENCOUNTER — Telehealth: Payer: Self-pay

## 2019-03-13 NOTE — Telephone Encounter (Signed)
Copied from [facility] [id number]. Topic: Quick Sport and exercise psychologist Patient (Clinic Use ONLY) >> Mar 10, 2019 10:15 AM Neta Ehlers, RMA wrote: Reason for CRM: Called Pt to tell her Dr. [name] stated  Pt can discontinue the B12 supplement. We can recheck with her next labs. Thanks >> Mar 13, 2019  1:43 PM Leward Quan A wrote: Patient called back and was informed

## 2019-03-26 ENCOUNTER — Ambulatory Visit: Payer: Self-pay | Admitting: Pharmacist

## 2019-03-26 DIAGNOSIS — E118 Type 2 diabetes mellitus with unspecified complications: Secondary | ICD-10-CM

## 2019-03-26 NOTE — Patient Instructions (Signed)
Visit Information  Goals Addressed            This Visit's Progress     Patient Stated   . "I want to work on my diabetes" (pt-stated)       Current Barriers:  . Diabetes: not optimally managed; most recent A1c 6.6%   o We had collaboratively decided to apply for [place] patient assistance, but I have not received patient's portion of application yet . Current antihyperglycemic regimen: metformin 500 mg QAM . 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 Current blood glucose readings: Fasting this morning 161; sometimes 122; generally <140  . Cardiovascular risk reduction: o Current hypertensive regimen: carvedilol 6.25 mg BID, amlodipine 10 mg daily o Current hyperlipidemia regimen: atorvastatin 40 mg daily o Current antiplatelet regimen: ASA 81 mg daily; Dr. Delana Meyer responded to my message and confirmed that clopidogrel was d/c  Pharmacist Clinical Goal(s):  Marland Kitchen Over the next 90 days, patient will work with PharmD and primary care provider to address optimized medication management  Interventions: . Contacted patient. She has not heard anything from [facility] regarding Medicare Extra Help . Patient's daughter apparently mailed the patient portion of application back to me at clinic, however, I have not seen this. I have reprinted application for patient to sign; she will ask someone to pick it up for her, bring to her for signature and inclusion of proof of income, and will bring back to me at clinic instead of mailing back  Patient Self Care Activities:  . Patient will check blood glucose daily , document, and provide at future appointments . Patient will take medications as prescribed . Patient will report any questions or concerns to provider   Please see past updates related to this goal by clicking on the "Past Updates" button in the selected goal         The patient verbalized understanding of instructions provided today and declined a print copy of patient  instruction materials.   Plan:  - Will outreach patient in ~2 weeks to follow up on medication access needs  [name], PharmD, [place] Pharmacist [place] [phone]

## 2019-03-26 NOTE — Chronic Care Management (AMB) (Signed)
Chronic Care Management   Follow Up Note   03/26/2019 Name: Crystal Miller MRN: [id number] DOB: Nov 29, 1945  Referred by: Crystal Haven, MD Reason for referral : Chronic Care Management (Medication Management)   Crystal Miller is a 73 y.o. year old female who is a primary care patient of Crystal Miller, Crystal Adam, MD. The CCM team was consulted for assistance with chronic disease management and care coordination needs.    Contacted patient for medication management review today.   Review of patient status, including review of consultants reports, relevant laboratory and other test results, and collaboration with appropriate care team members and the patient's provider was performed as part of comprehensive patient evaluation and provision of chronic care management services.    SDOH (Social Determinants of Health) screening performed today: Financial Strain . See Care Plan for related entries.   Outpatient Encounter Medications as of 03/26/2019  Medication Sig Note  . acetaminophen (TYLENOL) 500 MG tablet Take 1,000 mg by mouth every 6 (six) hours as needed (for pain.). 02/26/2019: PRN; ~1 week  . alendronate (FOSAMAX) 70 MG tablet TAKE 1 TABLET BY MOUTH EVERY 7 DAYS. TAKE WITH A FULL GLASS OF WATER ON AN EMPTY STOMACH.   Marland Kitchen amLODipine (NORVASC) 10 MG tablet Take 1 tablet by mouth once daily 02/26/2019: QAM   . aspirin EC 81 MG EC tablet Take 1 tablet (81 mg total) by mouth daily.   Marland Kitchen atorvastatin (LIPITOR) 40 MG tablet TAKE 1 TABLET BY MOUTH ONCE DAILY AT 6PM   . blood glucose meter kit and supplies KIT Dispense based on patient and insurance preference. Use up to four times daily as directed. (FOR ICD-9 250.00, 250.01).   . carvedilol (COREG) 6.25 MG tablet Take 1 tablet (6.25 mg total) by mouth 2 (two) times daily with a meal.   . linagliptin (TRADJENTA) 5 MG TABS tablet Take 1 tablet (5 mg total) by mouth daily.   . metFORMIN (GLUCOPHAGE) 500 MG tablet Take 1 tablet (500 mg total) by mouth  daily with breakfast.   . Multiple Vitamin (MULTIVITAMIN WITH MINERALS) TABS tablet Take 1 tablet by mouth daily. Centrum Silver . In the morning   . Polyethyl Glycol-Propyl Glycol (LUBRICANT EYE DROPS) 0.4-0.3 % SOLN Place 1 drop into both eyes 3 (three) times daily as needed (for dry eyes.). 08/01/2018: Patient not taking  . sertraline (ZOLOFT) 50 MG tablet Take 0.5 tablets (25 mg total) by mouth daily.   . solifenacin (VESICARE) 5 MG tablet Take 1 tablet (5 mg total) by mouth daily. (Patient not taking: Reported on 02/26/2019) 11/20/2018: Patient states, "cost is not affordable."  . vitamin B-12 (CYANOCOBALAMIN) 1000 MCG tablet Take 1,000 mcg by mouth daily.    . [DISCONTINUED] clopidogrel (PLAVIX) 75 MG tablet Take 1 tablet (75 mg total) by mouth daily. (Patient not taking: Reported on 02/10/2019)    No facility-administered encounter medications on file as of 03/26/2019.      Goals Addressed            This Visit's Progress     Patient Stated   . "I want to work on my diabetes" (pt-stated)       Current Barriers:  . Diabetes: not optimally managed; most recent A1c 6.6%   o We had collaboratively decided to apply for [place] patient assistance, but I have not received patient's portion of application yet . Current antihyperglycemic regimen: metformin 500 mg QAM . 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 Current blood glucose readings:  Fasting this morning 161; sometimes 122; generally <140  . Cardiovascular risk reduction: o Current hypertensive regimen: carvedilol 6.25 mg BID, amlodipine 10 mg daily o Current hyperlipidemia regimen: atorvastatin 40 mg daily o Current antiplatelet regimen: ASA 81 mg daily; Dr. Delana Meyer responded to my message and confirmed that clopidogrel was d/c  Pharmacist Clinical Goal(s):  Marland Kitchen Over the next 90 days, patient will work with PharmD and primary care provider to address optimized medication management  Interventions: . Contacted  patient. She has not heard anything from [facility] regarding Medicare Extra Help . Patient's daughter apparently mailed the patient portion of application back to me at clinic, however, I have not seen this. I have reprinted application for patient to sign; she will ask someone to pick it up for her, bring to her for signature and inclusion of proof of income, and will bring back to me at clinic instead of mailing back  Patient Self Care Activities:  . Patient will check blood glucose daily , document, and provide at future appointments . Patient will take medications as prescribed . Patient will report any questions or concerns to provider   Please see past updates related to this goal by clicking on the "Past Updates" button in the selected goal         Plan:  - Will outreach patient in ~2 weeks to follow up on medication access needs  [name], PharmD, [place] Pharmacist [place] [phone]

## 2019-03-27 NOTE — Progress Notes (Signed)
Reviewed.  Agree with plan   Dr Jodette Wik 

## 2019-04-02 ENCOUNTER — Telehealth: Payer: Medicare HMO

## 2019-04-06 ENCOUNTER — Ambulatory Visit (INDEPENDENT_AMBULATORY_CARE_PROVIDER_SITE_OTHER): Payer: Medicare HMO | Admitting: Pharmacist

## 2019-04-06 DIAGNOSIS — E118 Type 2 diabetes mellitus with unspecified complications: Secondary | ICD-10-CM

## 2019-04-06 MED ORDER — LINAGLIPTIN 5 MG PO TABS: 5.0000 mg | ORAL_TABLET | Freq: Every day | ORAL | 1 refills | Status: DC

## 2019-04-06 NOTE — Progress Notes (Signed)
Reviewed.  Agree with assessment and plan.    Dr Kesi Perrow 

## 2019-04-06 NOTE — Patient Instructions (Signed)
Visit Information  Goals Addressed            This Visit's Progress     Patient Stated   . "I want to work on my diabetes" (pt-stated)       Current Barriers:  . Diabetes: not optimally managed; most recent A1c 6.6%   o Patient calls today noting that she does have Medicare Extra Help . Current antihyperglycemic regimen: metformin 500 mg QAM . 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 Cardiovascular risk reduction: o Current hypertensive regimen: carvedilol 6.25 mg BID, amlodipine 10 mg daily o Current hyperlipidemia regimen: atorvastatin 40 mg daily o Current antiplatelet regimen: ASA 81 mg daily;  Pharmacist Clinical Goal(s):  Marland Kitchen Over the next 90 days, patient will work with PharmD and primary care provider to address optimized medication management  Interventions: . Start Tradjenta 5 mg daily. Sent 90 day supply to pharmacy, as it is the same copay as 30 day supply.  . Contacted patient; counseled to d/c metformin when she starts Tradjenta.   Patient Self Care Activities:  . Patient will check blood glucose daily , document, and provide at future appointments . Patient will take medications as prescribed . Patient will report any questions or concerns to provider   Please see past updates related to this goal by clicking on the "Past Updates" button in the selected goal         The patient verbalized understanding of instructions provided today and declined a print copy of patient instruction materials.   Plan: - Will outreach patient next week as scheduled to review transition to [place].  [name], PharmD, [place] Pharmacist [facility] [place] [phone]

## 2019-04-06 NOTE — Chronic Care Management (AMB) (Signed)
Chronic Care Management   Follow Up Note   04/06/2019 Name: Crystal Miller MRN: [id number] DOB: 03-04-1946  Referred by: [name], MD Reason for referral : Chronic Care Management (Medication Management)   Crystal Miller is a 73 y.o. year old female who is a primary care patient of [name], [name], MD. The CCM team was consulted for assistance with chronic disease management and care coordination needs.    Received call from patient today w/ updated about Medicare Extra Help  Review of patient status, including review of consultants reports, relevant laboratory and other test results, and collaboration with appropriate care team members and the patient's provider was performed as part of comprehensive patient evaluation and provision of chronic care management services.    SDOH (Social Determinants of Health) screening performed today: Financial Strain . See Care Plan for related entries.   Outpatient Encounter Medications as of 04/06/2019  Medication Sig Note  . acetaminophen (TYLENOL) 500 MG tablet Take 1,000 mg by mouth every 6 (six) hours as needed (for pain.). 02/26/2019: PRN; ~1 week  . alendronate (FOSAMAX) 70 MG tablet TAKE 1 TABLET BY MOUTH EVERY 7 DAYS. TAKE WITH A FULL GLASS OF WATER ON AN EMPTY STOMACH.   Marland Kitchen amLODipine (NORVASC) 10 MG tablet Take 1 tablet by mouth once daily 02/26/2019: QAM   . aspirin EC 81 MG EC tablet Take 1 tablet (81 mg total) by mouth daily.   Marland Kitchen atorvastatin (LIPITOR) 40 MG tablet TAKE 1 TABLET BY MOUTH ONCE DAILY AT 6PM   . blood glucose meter kit and supplies KIT Dispense based on patient and insurance preference. Use up to four times daily as directed. (FOR ICD-9 250.00, 250.01).   . carvedilol (COREG) 6.25 MG tablet Take 1 tablet (6.25 mg total) by mouth 2 (two) times daily with a meal.   . linagliptin (TRADJENTA) 5 MG TABS tablet Take 1 tablet (5 mg total) by mouth daily.   . metFORMIN (GLUCOPHAGE) 500 MG tablet Take 1 tablet (500 mg total)  by mouth daily with breakfast.   . Multiple Vitamin (MULTIVITAMIN WITH MINERALS) TABS tablet Take 1 tablet by mouth daily. Centrum Silver . In the morning   . Polyethyl Glycol-Propyl Glycol (LUBRICANT EYE DROPS) 0.4-0.3 % SOLN Place 1 drop into both eyes 3 (three) times daily as needed (for dry eyes.). 08/01/2018: Patient not taking  . sertraline (ZOLOFT) 50 MG tablet Take 0.5 tablets (25 mg total) by mouth daily.   . solifenacin (VESICARE) 5 MG tablet Take 1 tablet (5 mg total) by mouth daily. (Patient not taking: Reported on 02/26/2019) 11/20/2018: Patient states, "cost is not affordable."  . vitamin B-12 (CYANOCOBALAMIN) 1000 MCG tablet Take 1,000 mcg by mouth daily.    . [DISCONTINUED] linagliptin (TRADJENTA) 5 MG TABS tablet Take 1 tablet (5 mg total) by mouth daily.    No facility-administered encounter medications on file as of 04/06/2019.      Goals Addressed            This Visit's Progress     Patient Stated   . "I want to work on my diabetes" (pt-stated)       Current Barriers:  . Diabetes: not optimally managed; most recent A1c 6.6%   o Patient calls today noting that she does have Medicare Extra Help . Current antihyperglycemic regimen: metformin 500 mg QAM . 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 Cardiovascular risk reduction: o Current hypertensive regimen: carvedilol 6.25 mg BID, amlodipine 10 mg daily  o Current hyperlipidemia regimen: atorvastatin 40 mg daily o Current antiplatelet regimen: ASA 81 mg daily;  Pharmacist Clinical Goal(s):  Marland Kitchen Over the next 90 days, patient will work with PharmD and primary care provider to address optimized medication management  Interventions: . Start Tradjenta 5 mg daily. Sent 90 day supply to pharmacy, as it is the same copay as 30 day supply.  . Contacted patient; counseled to d/c metformin when she starts Tradjenta.   Patient Self Care Activities:  . Patient will check blood glucose daily , document, and  provide at future appointments . Patient will take medications as prescribed . Patient will report any questions or concerns to provider   Please see past updates related to this goal by clicking on the "Past Updates" button in the selected goal          Plan: - Will outreach patient next week as scheduled to review transition to [place].  [name], PharmD, [place] Pharmacist [facility] [place] [phone]

## 2019-04-13 ENCOUNTER — Ambulatory Visit: Payer: Medicare HMO | Admitting: Pharmacist

## 2019-04-13 DIAGNOSIS — E118 Type 2 diabetes mellitus with unspecified complications: Secondary | ICD-10-CM

## 2019-04-13 NOTE — Chronic Care Management (AMB) (Signed)
Chronic Care Management   Follow Up Note   04/13/2019 Name: Crystal Miller MRN: [id number] DOB: [birth year]   Referred by: [name], MD Reason for referral : Chronic Care Management (Medication Management)   Crystal Miller is a 73 y.o. year old female who is a primary care patient of [name], [name], MD. The CCM team was consulted for assistance with chronic disease management and care coordination needs.    Contacted patient to f/u on transition to [facility] therapy.   Review of patient status, including review of consultants reports, relevant laboratory and other test results, and collaboration with appropriate care team members and the patient's provider was performed as part of comprehensive patient evaluation and provision of chronic care management services.    SDOH (Social Determinants of Health) screening performed today: Financial Strain . See Care Plan for related entries.   Outpatient Encounter Medications as of 04/13/2019  Medication Sig Note  . linagliptin (TRADJENTA) 5 MG TABS tablet Take 1 tablet (5 mg total) by mouth daily.   Marland Kitchen acetaminophen (TYLENOL) 500 MG tablet Take 1,000 mg by mouth every 6 (six) hours as needed (for pain.). 02/26/2019: PRN; ~1 week  . alendronate (FOSAMAX) 70 MG tablet TAKE 1 TABLET BY MOUTH EVERY 7 DAYS. TAKE WITH A FULL GLASS OF WATER ON AN EMPTY STOMACH.   Marland Kitchen amLODipine (NORVASC) 10 MG tablet Take 1 tablet by mouth once daily 02/26/2019: QAM   . aspirin EC 81 MG EC tablet Take 1 tablet (81 mg total) by mouth daily.   Marland Kitchen atorvastatin (LIPITOR) 40 MG tablet TAKE 1 TABLET BY MOUTH ONCE DAILY AT 6PM   . blood glucose meter kit and supplies KIT Dispense based on patient and insurance preference. Use up to four times daily as directed. (FOR ICD-9 250.00, 250.01).   . carvedilol (COREG) 6.25 MG tablet Take 1 tablet (6.25 mg total) by mouth 2 (two) times daily with a meal.   . Multiple Vitamin (MULTIVITAMIN WITH MINERALS) TABS tablet Take 1 tablet  by mouth daily. Centrum Silver . In the morning   . Polyethyl Glycol-Propyl Glycol (LUBRICANT EYE DROPS) 0.4-0.3 % SOLN Place 1 drop into both eyes 3 (three) times daily as needed (for dry eyes.). 08/01/2018: Patient not taking  . sertraline (ZOLOFT) 50 MG tablet Take 0.5 tablets (25 mg total) by mouth daily.   . solifenacin (VESICARE) 5 MG tablet Take 1 tablet (5 mg total) by mouth daily. (Patient not taking: Reported on 02/26/2019) 11/20/2018: Patient states, "cost is not affordable."  . vitamin B-12 (CYANOCOBALAMIN) 1000 MCG tablet Take 1,000 mcg by mouth daily.    . [DISCONTINUED] metFORMIN (GLUCOPHAGE) 500 MG tablet Take 1 tablet (500 mg total) by mouth daily with breakfast.    No facility-administered encounter medications on file as of 04/13/2019.      Goals Addressed            This Visit's Progress     Patient Stated   . "I want to work on my diabetes" (pt-stated)       Current Barriers:  . Diabetes: not optimally managed; most recent A1c 6.6%   o Notes that she started on Tradjenta on Sunday. Notes she has not had any diarrhea accidents yet since d/c metformin . Current antihyperglycemic regimen: Tradjenta 5 mg daily o Patient has Medicare Extra Help o Endorsed diarrhea on metformin therapy . 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 Current glucose readings: o Reports this morning was 157 . Cardiovascular risk  reduction: o Current hypertensive regimen: carvedilol 6.25 mg BID, amlodipine 10 mg daily o Current hyperlipidemia regimen: atorvastatin 40 mg daily o Current antiplatelet regimen: ASA 81 mg daily;  Pharmacist Clinical Goal(s):  Marland Kitchen Over the next 90 days, patient will work with PharmD and primary care provider to address optimized medication management  Interventions: . Encouraged patient to continue to take Tradjenta daily. Encouraged continued glucose checks to determine efficacy. A1c goal <7-7.5% likely appropriate for this patient.  Patient Self  Care Activities:  . Patient will check blood glucose daily , document, and provide at future appointments . Patient will take medications as prescribed . Patient will report any questions or concerns to provider   Please see past updates related to this goal by clicking on the "Past Updates" button in the selected goal          Plan: - Patient has f/u with Dr. [name] in ~4 weeks. Will f/u with patient in ~6-8 weeks for continued medication management support  [name], PharmD, [place] Pharmacist [place] [phone]

## 2019-04-13 NOTE — Patient Instructions (Signed)
Visit Information  Goals Addressed            This Visit's Progress     Patient Stated   . "I want to work on my diabetes" (pt-stated)       Current Barriers:  . Diabetes: not optimally managed; most recent A1c 6.6%   o Notes that she started on Tradjenta on Sunday. Notes she has not had any diarrhea accidents yet since d/c metformin . Current antihyperglycemic regimen: Tradjenta 5 mg daily o Patient has Medicare Extra Help o Endorsed diarrhea on metformin therapy . 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 Current glucose readings: o Reports this morning was 157 . Cardiovascular risk reduction: o Current hypertensive regimen: carvedilol 6.25 mg BID, amlodipine 10 mg daily o Current hyperlipidemia regimen: atorvastatin 40 mg daily o Current antiplatelet regimen: ASA 81 mg daily;  Pharmacist Clinical Goal(s):  Marland Kitchen Over the next 90 days, patient will work with PharmD and primary care provider to address optimized medication management  Interventions: . Encouraged patient to continue to take Tradjenta daily. Encouraged continued glucose checks to determine efficacy. A1c goal <7-7.5% likely appropriate for this patient.  Patient Self Care Activities:  . Patient will check blood glucose daily , document, and provide at future appointments . Patient will take medications as prescribed . Patient will report any questions or concerns to provider   Please see past updates related to this goal by clicking on the "Past Updates" button in the selected goal         The patient verbalized understanding of instructions provided today and declined a print copy of patient instruction materials.   Plan: - Patient has f/u with Dr. [name] in ~4 weeks. Will f/u with patient in ~6-8 weeks for continued medication management support  [name], PharmD, [place] Pharmacist [place] [phone]

## 2019-04-13 NOTE — Progress Notes (Signed)
Reviewed information.  Agree with assessment and plan.    Dr Valaria Kohut 

## 2019-05-18 ENCOUNTER — Other Ambulatory Visit: Payer: Self-pay

## 2019-05-18 ENCOUNTER — Ambulatory Visit (INDEPENDENT_AMBULATORY_CARE_PROVIDER_SITE_OTHER): Payer: Medicare HMO | Admitting: Family Medicine

## 2019-05-18 ENCOUNTER — Encounter: Payer: Self-pay | Admitting: Family Medicine

## 2019-05-18 DIAGNOSIS — E118 Type 2 diabetes mellitus with unspecified complications: Secondary | ICD-10-CM

## 2019-05-18 DIAGNOSIS — E11319 Type 2 diabetes mellitus with unspecified diabetic retinopathy without macular edema: Secondary | ICD-10-CM | POA: Diagnosis not present

## 2019-05-18 DIAGNOSIS — F330 Major depressive disorder, recurrent, mild: Secondary | ICD-10-CM

## 2019-05-18 DIAGNOSIS — R69 Illness, unspecified: Secondary | ICD-10-CM | POA: Diagnosis not present

## 2019-05-18 DIAGNOSIS — I10 Essential (primary) hypertension: Secondary | ICD-10-CM

## 2019-05-18 MED ORDER — SERTRALINE HCL 50 MG PO TABS: 25.0000 mg | ORAL_TABLET | Freq: Every day | ORAL | 3 refills | Status: DC

## 2019-05-18 NOTE — Progress Notes (Signed)
Virtual Visit via video Note  This visit type was conducted due to national recommendations for restrictions regarding the COVID-19 pandemic (e.g. social distancing).  This format is felt to be most appropriate for this patient at this time.  All issues noted in this document were discussed and addressed.  No physical exam was performed (except for noted visual exam findings with Video Visits).   I connected with Crystal Miller today at  1:45 PM EST by a video enabled telemedicine application or telephone and verified that I am speaking with the correct person using two identifiers. Location patient: home Location provider: work Persons participating in the virtual visit: patient, provider, Donia Ast (grandson)  I discussed the limitations, risks, security and privacy concerns of performing an evaluation and management service by telephone and the availability of in person appointments. I also discussed with the patient that there may be a patient responsible charge related to this service. The patient expressed understanding and agreed to proceed.   Reason for visit: follow-up  HPI: Diabetes: Typically CBGs are 130-150.  Taking Tradjenta.  No polyuria or polydipsia.  No hypoglycemia.  Hypertension: Typically 150-160/63.  Taking amlodipine and carvedilol.  No chest pain, shortness of breath, or edema.  She has been having lots of Ramen noodles and [place] sausage.  She does not pay attention to salt intake.  Diabetic retinopathy: She follows with ophthalmology yearly.  She notes her vision has been stable.  They mentioned that she had cataracts and would need those removed.  She has deferred that until after the pandemic improves.  Depression: She notes no depressive symptoms.  She does note she has been sad recently she had a friend passed away and a cousin die from Covid.  Sertraline has been beneficial.   ROS: See pertinent positives and negatives per HPI.  Past Medical History:   Diagnosis Date  . Acute pyelonephritis 08/01/2018  . Depression    husband died in Jul 29, 2015  . DM (diabetes mellitus), type 2 with complications ([place]) 8/36/6294  . Essential hypertension 11/01/2016  . GERD (gastroesophageal reflux disease)   . Mixed hyperlipidemia 11/01/2016  . Peripheral vascular disease ([place])   . Renal insufficiency   . Right pontine stroke ([facility]) 03/27/2017  . Stroke ([place]) Jul 28, 2016   slight residual with speech. does not drive Lt side weakness    Past Surgical History:  Procedure Laterality Date  . CHOLECYSTECTOMY    . LOWER EXTREMITY ANGIOGRAPHY Left 12/31/2017   Procedure: LOWER EXTREMITY ANGIOGRAPHY;  Surgeon: Katha Cabal, MD;  Location: [facility];  Service: Cardiovascular;  Laterality: Left;  . LOWER EXTREMITY ANGIOGRAPHY Right 04/22/2018   Procedure: LOWER EXTREMITY ANGIOGRAPHY;  Surgeon: Katha Cabal, MD;  Location: [facility];  Service: Cardiovascular;  Laterality: Right;    Family History  Problem Relation Age of Onset  . Alzheimer's disease Mother   . Arthritis Mother   . Hypertension Mother   . CVA Father   . CAD Father   . Heart disease Father   . Stroke Father   . Hypertension Father   . Stroke Brother   . Breast cancer Neg Hx     SOCIAL HX: Former smoker   Current Outpatient Medications:  .  acetaminophen (TYLENOL) 500 MG tablet, Take 1,000 mg by mouth every 6 (six) hours as needed (for pain.)., Disp: , Rfl:  .  alendronate (FOSAMAX) 70 MG tablet, TAKE 1 TABLET BY MOUTH EVERY 7 DAYS. TAKE WITH A FULL GLASS OF  WATER ON AN EMPTY STOMACH., Disp: 12 tablet, Rfl: 0 .  amLODipine (NORVASC) 10 MG tablet, Take 1 tablet by mouth once daily, Disp: 90 tablet, Rfl: 0 .  aspirin EC 81 MG EC tablet, Take 1 tablet (81 mg total) by mouth daily., Disp: 30 tablet, Rfl: 2 .  atorvastatin (LIPITOR) 40 MG tablet, TAKE 1 TABLET BY MOUTH ONCE DAILY AT 6PM, Disp: 90 tablet, Rfl: 2 .  blood glucose meter kit and supplies KIT, Dispense based  on patient and insurance preference. Use up to four times daily as directed. (FOR ICD-9 250.00, 250.01)., Disp: 1 each, Rfl: 0 .  carvedilol (COREG) 6.25 MG tablet, Take 1 tablet (6.25 mg total) by mouth 2 (two) times daily with a meal., Disp: 180 tablet, Rfl: 3 .  linagliptin (TRADJENTA) 5 MG TABS tablet, Take 1 tablet (5 mg total) by mouth daily., Disp: 90 tablet, Rfl: 1 .  Multiple Vitamin (MULTIVITAMIN WITH MINERALS) TABS tablet, Take 1 tablet by mouth daily. Centrum Silver . In the morning, Disp: , Rfl:  .  Polyethyl Glycol-Propyl Glycol (LUBRICANT EYE DROPS) 0.4-0.3 % SOLN, Place 1 drop into both eyes 3 (three) times daily as needed (for dry eyes.)., Disp: , Rfl:  .  sertraline (ZOLOFT) 50 MG tablet, Take 0.5 tablets (25 mg total) by mouth daily., Disp: 45 tablet, Rfl: 3 .  solifenacin (VESICARE) 5 MG tablet, Take 1 tablet (5 mg total) by mouth daily., Disp: 30 tablet, Rfl: 1 .  vitamin B-12 (CYANOCOBALAMIN) 1000 MCG tablet, Take 1,000 mcg by mouth daily. , Disp: , Rfl:   EXAM:  VITALS per patient if applicable:  GENERAL: alert, oriented, appears well and in no acute distress  HEENT: atraumatic, conjunttiva clear, no obvious abnormalities on inspection of external nose and ears  NECK: normal movements of the head and neck  LUNGS: on inspection no signs of respiratory distress, breathing rate appears normal, no obvious gross SOB, gasping or wheezing  CV: no obvious cyanosis  MS: moves all visible extremities without noticeable abnormality  PSYCH/NEURO: pleasant and cooperative, no obvious depression or anxiety, speech and thought processing grossly intact  ASSESSMENT AND PLAN:  Discussed the following assessment and plan:  Essential hypertension Systolic BP is above goal though diastolic is borderline low.  I discussed decreasing her sodium intake.  We will mail her a DASH diet.  She will contact us with her BP readings in about 2 weeks.  Consider treatment based off of those  readings.  DM (diabetes mellitus), type 2 with complications (H[facility]) CBGs adequate.  Most recent A1c well controlled.  She will continue Tradjenta.  Diabetic retinopathy ([place]) No recent vision changes.  She will continue to follow with ophthalmology.  Depression Asymptomatic.  She will continue sertraline.    I discussed the assessment and treatment plan with the patient. The patient was provided an opportunity to ask questions and all were answered. The patient agreed with the plan and demonstrated an understanding of the instructions.   The patient was advised to call back or seek an in-person evaluation if the symptoms worsen or if the condition fails to improve as anticipated.   Tommi Rumps, MD

## 2019-05-18 NOTE — Patient Instructions (Signed)

## 2019-05-18 NOTE — Assessment & Plan Note (Signed)
Systolic BP is above goal though diastolic is borderline low.  I discussed decreasing her sodium intake.  We will mail her a DASH diet.  She will contact us with her BP readings in about 2 weeks.  Consider treatment based off of those readings.

## 2019-05-18 NOTE — Assessment & Plan Note (Signed)
No recent vision changes.  She will continue to follow with ophthalmology.

## 2019-05-18 NOTE — Assessment & Plan Note (Signed)
CBGs adequate.  Most recent A1c well controlled.  She will continue Tradjenta.

## 2019-05-18 NOTE — Assessment & Plan Note (Signed)
Asymptomatic.  She will continue sertraline.

## 2019-05-19 ENCOUNTER — Telehealth: Payer: Self-pay | Admitting: Family Medicine

## 2019-05-19 NOTE — Telephone Encounter (Signed)
I called pt and left a vm to call ofc to schedule Return in about 3 months (around 08/16/2019).

## 2019-05-31 ENCOUNTER — Other Ambulatory Visit: Payer: Self-pay | Admitting: Family Medicine

## 2019-06-01 ENCOUNTER — Ambulatory Visit (INDEPENDENT_AMBULATORY_CARE_PROVIDER_SITE_OTHER): Payer: Medicare HMO | Admitting: Pharmacist

## 2019-06-01 DIAGNOSIS — E118 Type 2 diabetes mellitus with unspecified complications: Secondary | ICD-10-CM

## 2019-06-01 DIAGNOSIS — I10 Essential (primary) hypertension: Secondary | ICD-10-CM

## 2019-06-01 NOTE — Chronic Care Management (AMB) (Signed)
Chronic Care Management   Follow Up Note   06/01/2019 Name: Jeriyah Granlund MRN: [id number] DOB: [birth year]  Referred by: [name], MD Reason for referral : Chronic Care Management (Medication Management)   Forest Pruden is a 74 y.o. year old adult who is a primary care patient of [name], [name], MD. The CCM team was consulted for assistance with chronic disease management and care coordination needs.    Contacted patient for medication management review.   Review of patient status, including review of consultants reports, relevant laboratory and other test results, and collaboration with appropriate care team members and the patient's provider was performed as part of comprehensive patient evaluation and provision of chronic care management services.    SDOH (Social Determinants of Health) screening performed today: Financial Strain  None. See Care Plan for related entries.   Outpatient Encounter Medications as of 06/01/2019  Medication Sig Note  . alendronate (FOSAMAX) 70 MG tablet TAKE 1 TABLET BY MOUTH EVERY 7 DAYS. TAKE WITH A FULL GLASS OF WATER ON AN EMPTY STOMACH.   Marland Kitchen amLODipine (NORVASC) 10 MG tablet Take 1 tablet by mouth once daily 02/26/2019: QAM   . aspirin EC 81 MG EC tablet Take 1 tablet (81 mg total) by mouth daily.   Marland Kitchen atorvastatin (LIPITOR) 40 MG tablet TAKE 1 TABLET BY MOUTH ONCE DAILY AT  6PM   . blood glucose meter kit and supplies KIT Dispense based on patient and insurance preference. Use up to four times daily as directed. (FOR ICD-9 250.00, 250.01).   . carvedilol (COREG) 6.25 MG tablet Take 1 tablet (6.25 mg total) by mouth 2 (two) times daily with a meal.   . linagliptin (TRADJENTA) 5 MG TABS tablet Take 1 tablet (5 mg total) by mouth daily.   . Multiple Vitamin (MULTIVITAMIN WITH MINERALS) TABS tablet Take 1 tablet by mouth daily. Centrum Silver . In the morning   . sertraline (ZOLOFT) 50 MG tablet Take 0.5 tablets (25 mg total) by mouth daily.   .  vitamin B-12 (CYANOCOBALAMIN) 1000 MCG tablet Take 1,000 mcg by mouth daily.    Marland Kitchen acetaminophen (TYLENOL) 500 MG tablet Take 1,000 mg by mouth every 6 (six) hours as needed (for pain.). 02/26/2019: PRN; ~1 week  . Polyethyl Glycol-Propyl Glycol (LUBRICANT EYE DROPS) 0.4-0.3 % SOLN Place 1 drop into both eyes 3 (three) times daily as needed (for dry eyes.).   Marland Kitchen solifenacin (VESICARE) 5 MG tablet Take 1 tablet (5 mg total) by mouth daily.   . [DISCONTINUED] atorvastatin (LIPITOR) 40 MG tablet TAKE 1 TABLET BY MOUTH ONCE DAILY AT 6PM    No facility-administered encounter medications on file as of 06/01/2019.     Goals Addressed            This Visit's Progress     Patient Stated   . "I want to work on my diabetes" (pt-stated)       Current Barriers:  . Diabetes: not optimally managed; most recent A1c 6.6%   o Notes some extra gas w/ Tradjenta. Has not tried OTC Gas-ex.  . Current antihyperglycemic regimen: Tradjenta 5 mg daily o Patient has Medicare Extra Help o Hx metformin, did not tolerate d/t diarrhea . Current meal patterns: o Breakfast- eats around 12 pm; sometimes toast, egg, [place] sausage; sometimes raisin bran, sometimes cereal . 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 Current glucose readings: o Reports nothing over 150 . Cardiovascular risk reduction: o Current hypertensive regimen: carvedilol 6.25  mg BID, amlodipine 10 mg daily; reports BP readings. Notes she uses salt when cooking, but does not add afterwards. Uses mostly frozen vegetables, sometimes some canned vegetables.  SBP DBP  134 66  133 72  135 71  122 69  140 64   o Current hyperlipidemia regimen: atorvastatin 40 mg daily; last LDL at goal <70 o Current antiplatelet regimen: ASA 81 mg daily;  Pharmacist Clinical Goal(s):  Marland Kitchen Over the next 90 days, patient will work with PharmD and primary care provider to address optimized medication management  Interventions: . Comprehensive  medication review performed, medication list updated in electronic medical record . Recommended she try OTC gas-ex for gas that she associates w/ Tradjenta. Would consider SGLT2, however, worry about exacerbating urinary frequency symptoms. Would want updated renal function lab work prior to initiation.  . Discussed goal SBP <140. Most recent BP readings reported by patient above are at goal.  . Extensive counseling on DASH diet principals. Will collaborate w/ office staff to re-mail AVS from Dr. Ellen Henri last visit with patient, as she notes she never received it. Encouraged continued use of frozen vegetables and lean meats, but search for low-sodium [place] sausage, rinse any canned vegetables, and try no-salt seasonings when cooking.   Patient Self Care Activities:  . Patient will check blood glucose daily , document, and provide at future appointments . Patient will take medications as prescribed . Patient will report any questions or concerns to provider   Please see past updates related to this goal by clicking on the "Past Updates" button in the selected goal          Plan: - Scheduled follow up call 07/06/2019 @ 2 pm  [name], PharmD, [place], [place] Pharmacist [place] [phone]

## 2019-06-01 NOTE — Patient Instructions (Signed)
Visit Information  Goals Addressed            This Visit's Progress     Patient Stated   . "I want to work on my diabetes" (pt-stated)       Current Barriers:  . Diabetes: not optimally managed; most recent A1c 6.6%   o Notes some extra gas w/ Tradjenta. Has not tried OTC Gas-ex.  . Current antihyperglycemic regimen: Tradjenta 5 mg daily o Patient has Medicare Extra Help o Hx metformin, did not tolerate d/t diarrhea . Current meal patterns: o Breakfast- eats around 12 pm; sometimes toast, egg, [place] sausage; sometimes raisin bran, sometimes cereal . 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 Current glucose readings: o Reports nothing over 150 . Cardiovascular risk reduction: o Current hypertensive regimen: carvedilol 6.25 mg BID, amlodipine 10 mg daily; reports BP readings. Notes she uses salt when cooking, but does not add afterwards. Uses mostly frozen vegetables, sometimes some canned vegetables.  SBP DBP  134 66  133 72  135 71  122 69  140 64   o Current hyperlipidemia regimen: atorvastatin 40 mg daily; last LDL at goal <70 o Current antiplatelet regimen: ASA 81 mg daily;  Pharmacist Clinical Goal(s):  Marland Kitchen Over the next 90 days, patient will work with PharmD and primary care provider to address optimized medication management  Interventions: . Comprehensive medication review performed, medication list updated in electronic medical record . Recommended she try OTC gas-ex for gas that she associates w/ Tradjenta. Would consider SGLT2, however, worry about exacerbating urinary frequency symptoms. Would want updated renal function lab work prior to initiation.  . Discussed goal SBP <140. Most recent BP readings reported by patient above are at goal.  . Extensive counseling on DASH diet principals. Will collaborate w/ office staff to re-mail AVS from Dr. Purvis Sheffield last visit with patient, as she notes she never received it. Encouraged continued use of  frozen vegetables and lean meats, but search for low-sodium [place] sausage, rinse any canned vegetables, and try no-salt seasonings when cooking.   Patient Self Care Activities:  . Patient will check blood glucose daily , document, and provide at future appointments . Patient will take medications as prescribed . Patient will report any questions or concerns to provider   Please see past updates related to this goal by clicking on the "Past Updates" button in the selected goal         The patient verbalized understanding of instructions provided today and declined a print copy of patient instruction materials.   Plan: - Scheduled follow up call 07/06/2019 @ 2 pm  [name] Feliz Beam, PharmD, [place], CPP Clinical Pharmacist [place] [place] [employer] [phone]

## 2019-06-07 ENCOUNTER — Other Ambulatory Visit: Payer: Self-pay | Admitting: Family Medicine

## 2019-06-30 ENCOUNTER — Other Ambulatory Visit: Payer: Self-pay | Admitting: Family Medicine

## 2019-07-06 ENCOUNTER — Ambulatory Visit (INDEPENDENT_AMBULATORY_CARE_PROVIDER_SITE_OTHER): Payer: Medicare HMO | Admitting: Pharmacist

## 2019-07-06 DIAGNOSIS — E118 Type 2 diabetes mellitus with unspecified complications: Secondary | ICD-10-CM | POA: Diagnosis not present

## 2019-07-06 DIAGNOSIS — I10 Essential (primary) hypertension: Secondary | ICD-10-CM

## 2019-07-06 NOTE — Patient Instructions (Signed)
Visit Information  Goals Addressed            This Visit's Progress     Patient Stated   . "I want to work on my diabetes" (pt-stated)       Current Barriers:  . Diabetes: controlled; most recent A1c 6.6%   o Notes resolution of diarrhea since changing from metformin to Tradjenta, but some gas and some constipation. She notes she has a bowel movement every day as she always has, but has to strain more  . Current antihyperglycemic regimen: Tradjenta 5 mg daily o Patient has Medicare Extra Help o Hx metformin, did not tolerate d/t diarrhea . Current meal patterns: o Snacks - eating more ice cream (though sugar free), occasional other desserts . 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 Current glucose readings: o Reports fasting this morning 141, generally , but sometimes 160  . Cardiovascular risk reduction: o Current hypertensive regimen: carvedilol 6.25 mg BID, amlodipine 10 mg daily; reports BP readings: today 140/73 HR 65; has been mostly using a wrist cuff, but her daughter brought over an arm cuff that she feels runs a bit lower o Current hyperlipidemia regimen: atorvastatin 40 mg daily; last LDL at goal <70 o Current antiplatelet regimen: ASA 81 mg daily;  Pharmacist Clinical Goal(s):  Marland Kitchen Over the next 90 days, patient will work with PharmD and primary care provider to address optimized medication management  Interventions: . Comprehensive medication review performed, medication list updated in electronic medical record . Patient notes she has Miralax at home and wonders if she could take that. Recommended trying daily Miralax to help with constipation. Encouraged hydration. If no improvement, consider senna-docusate for dual stool softener + stimulant mechanism. Counseled that if she develops stomach/GI pain or is unable to have a bowel movement that she contact Dr. Purvis [place] office.  . Discussed that wrist BP machines generally aren't as accurate as arm  cuffs, recommend continued use of arm cuff.  . Reviewed multivitamin. She is currently taking a Garment/textile technologist MVI, has calcium 220 mg and Vitamin D 1000 units. She notes occasional milk, but no regular daily dairy consumption. Recommended she purchase a Centrum Silver Women's MVI next time for higher calcium supplementation, given concurrent bisphosphonate therapy. She verbalized understanding.   Patient Self Care Activities:  . Patient will check blood glucose daily , document, and provide at future appointments . Patient will take medications as prescribed . Patient will report any questions or concerns to provider   Please see past updates related to this goal by clicking on the "Past Updates" button in the selected goal         The patient verbalized understanding of instructions provided today and declined a print copy of patient instruction materials.   Plan:  - Scheduled f/u call 09/07/19  [name] Feliz Beam, PharmD, BCACP, CPP Clinical Pharmacist [facility] [employer] [phone]

## 2019-07-06 NOTE — Chronic Care Management (AMB) (Signed)
Chronic Care Management   Follow Up Note   07/06/2019 Name: Crystal Miller MRN: [id number] DOB: Jun 21, 1945  Referred by: [name], MD Reason for referral : Chronic Care Management (Medication Management)   Crystal Miller is a 74 y.o. year old adult who is a primary care patient of [name], [name], MD. The CCM team was consulted for assistance with chronic disease management and care coordination needs.  Contacted patient for medication management review.     Review of patient status, including review of consultants reports, relevant laboratory and other test results, and collaboration with appropriate care team members and the patient's provider was performed as part of comprehensive patient evaluation and provision of chronic care management services.    SDOH (Social Determinants of Health) screening performed today: None. See Care Plan for related entries.   Outpatient Encounter Medications as of 07/06/2019  Medication Sig Note  . alendronate (FOSAMAX) 70 MG tablet TAKE 1 TABLET BY MOUTH ONCE A WEEK WITH  A  FULL  GLASS  OF  WATER  ON  AN  EMPTY  STOMACH   . amLODipine (NORVASC) 10 MG tablet Take 1 tablet by mouth once daily   . aspirin EC 81 MG EC tablet Take 1 tablet (81 mg total) by mouth daily.   Marland Kitchen atorvastatin (LIPITOR) 40 MG tablet TAKE 1 TABLET BY MOUTH ONCE DAILY AT  6PM   . blood glucose meter kit and supplies KIT Dispense based on patient and insurance preference. Use up to four times daily as directed. (FOR ICD-9 250.00, 250.01).   . carvedilol (COREG) 6.25 MG tablet Take 1 tablet (6.25 mg total) by mouth 2 (two) times daily with a meal.   . linagliptin (TRADJENTA) 5 MG TABS tablet Take 1 tablet (5 mg total) by mouth daily.   . Multiple Vitamin (MULTIVITAMIN WITH MINERALS) TABS tablet Take 1 tablet by mouth daily. Centrum Silver . In the morning   . sertraline (ZOLOFT) 50 MG tablet Take 0.5 tablets (25 mg total) by mouth daily.   Marland Kitchen acetaminophen (TYLENOL) 500 MG  tablet Take 1,000 mg by mouth every 6 (six) hours as needed (for pain.). 02/26/2019: PRN; ~1 week  . Polyethyl Glycol-Propyl Glycol (LUBRICANT EYE DROPS) 0.4-0.3 % SOLN Place 1 drop into both eyes 3 (three) times daily as needed (for dry eyes.).   Marland Kitchen solifenacin (VESICARE) 5 MG tablet Take 1 tablet (5 mg total) by mouth daily. (Patient not taking: Reported on 07/06/2019)   . [DISCONTINUED] vitamin B-12 (CYANOCOBALAMIN) 1000 MCG tablet Take 1,000 mcg by mouth daily.     No facility-administered encounter medications on file as of 07/06/2019.     Objective:   Goals Addressed            This Visit's Progress     Patient Stated   . "I want to work on my diabetes" (pt-stated)       Current Barriers:  . Diabetes: controlled; most recent A1c 6.6%   o Notes resolution of diarrhea since changing from metformin to [place], but some gas and some constipation. She notes she has a bowel movement every day as she always has, but has to strain more  . Current antihyperglycemic regimen: Tradjenta 5 mg daily o Patient has Medicare Extra Help o Hx metformin, did not tolerate d/t diarrhea . Current meal patterns: o Snacks - eating more ice cream (though sugar free), occasional other desserts . 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 Current glucose readings: o Reports  fasting this morning 141, generally , but sometimes 160  . Cardiovascular risk reduction: o Current hypertensive regimen: carvedilol 6.25 mg BID, amlodipine 10 mg daily; reports BP readings: today 140/73 HR 65; has been mostly using a wrist cuff, but her daughter brought over an arm cuff that she feels runs a bit lower o Current hyperlipidemia regimen: atorvastatin 40 mg daily; last LDL at goal <70 o Current antiplatelet regimen: ASA 81 mg daily;  Pharmacist Clinical Goal(s):  Marland Kitchen Over the next 90 days, patient will work with PharmD and primary care provider to address optimized medication  management  Interventions: . Comprehensive medication review performed, medication list updated in electronic medical record . Patient notes she has Miralax at home and wonders if she could take that. Recommended trying daily Miralax to help with constipation. Encouraged hydration. If no improvement, consider senna-docusate for dual stool softener + stimulant mechanism. Counseled that if she develops stomach/GI pain or is unable to have a bowel movement that she contact Dr. Ellen Henri office.  . Discussed that wrist BP machines generally aren't as accurate as arm cuffs, recommend continued use of arm cuff.  . Reviewed multivitamin. She is currently taking a Fish farm manager MVI, has calcium 220 mg and Vitamin D 1000 units. She notes occasional milk, but no regular daily dairy consumption. Recommended she purchase a Centrum Silver Women's MVI next time for higher calcium supplementation, given concurrent bisphosphonate therapy. She verbalized understanding.   Patient Self Care Activities:  . Patient will check blood glucose daily , document, and provide at future appointments . Patient will take medications as prescribed . Patient will report any questions or concerns to provider   Please see past updates related to this goal by clicking on the "Past Updates" button in the selected goal          Plan:  - Scheduled f/u call 09/07/19  [name], PharmD, BCACP, [place] Pharmacist [facility] [phone]

## 2019-07-11 ENCOUNTER — Ambulatory Visit: Payer: Medicare HMO | Attending: Internal Medicine

## 2019-07-11 DIAGNOSIS — Z23 Encounter for immunization: Secondary | ICD-10-CM

## 2019-07-11 NOTE — Progress Notes (Signed)
   Covid-19 Vaccination Clinic  Name:  Crystal Miller    MRN: [id number] DOB: 01/25/1946  07/11/2019  [name] was observed post Covid-19 immunization for 15 minutes without incidence. She was provided with Vaccine Information Sheet and instruction to access the V-Safe system.   [name] was instructed to call 911 with any severe reactions post vaccine: Marland Kitchen Difficulty breathing  . Swelling of your face and throat  . A fast heartbeat  . A bad rash all over your body  . Dizziness and weakness    Immunizations Administered    Name Date Dose VIS Date Route   Pfizer COVID-19 Vaccine 07/11/2019  3:13 PM 0.3 mL 05/01/2019 Intramuscular   Manufacturer: [employer], [employer]   Lot: [id number]   NDC: [address]-4

## 2019-08-04 ENCOUNTER — Ambulatory Visit: Payer: Medicare HMO | Attending: Internal Medicine

## 2019-08-04 DIAGNOSIS — Z23 Encounter for immunization: Secondary | ICD-10-CM

## 2019-08-04 NOTE — Progress Notes (Signed)
   Covid-19 Vaccination Clinic  Name:  Crystal Miller    MRN: [id number] DOB: November 02, 1945  08/04/2019  [name] was observed post Covid-19 immunization for 15 minutes without incident. She was provided with Vaccine Information Sheet and instruction to access the V-Safe system.   [name] was instructed to call 911 with any severe reactions post vaccine: Marland Kitchen Difficulty breathing  . Swelling of face and throat  . A fast heartbeat  . A bad rash all over body  . Dizziness and weakness   Immunizations Administered    Name Date Dose VIS Date Route   Pfizer COVID-19 Vaccine 08/04/2019  2:59 PM 0.3 mL 05/01/2019 Intramuscular   Manufacturer: [employer], [employer]   Lot: [id number]   NDC: [address]-7

## 2019-08-19 ENCOUNTER — Telehealth (INDEPENDENT_AMBULATORY_CARE_PROVIDER_SITE_OTHER): Payer: Medicare HMO | Admitting: Family Medicine

## 2019-08-19 ENCOUNTER — Other Ambulatory Visit: Payer: Self-pay

## 2019-08-19 ENCOUNTER — Encounter: Payer: Self-pay | Admitting: Family Medicine

## 2019-08-19 DIAGNOSIS — M545 Low back pain, unspecified: Secondary | ICD-10-CM

## 2019-08-19 DIAGNOSIS — R143 Flatulence: Secondary | ICD-10-CM | POA: Diagnosis not present

## 2019-08-19 DIAGNOSIS — E118 Type 2 diabetes mellitus with unspecified complications: Secondary | ICD-10-CM | POA: Diagnosis not present

## 2019-08-19 DIAGNOSIS — R69 Illness, unspecified: Secondary | ICD-10-CM | POA: Diagnosis not present

## 2019-08-19 DIAGNOSIS — E11319 Type 2 diabetes mellitus with unspecified diabetic retinopathy without macular edema: Secondary | ICD-10-CM | POA: Diagnosis not present

## 2019-08-19 DIAGNOSIS — I10 Essential (primary) hypertension: Secondary | ICD-10-CM | POA: Diagnosis not present

## 2019-08-19 DIAGNOSIS — K219 Gastro-esophageal reflux disease without esophagitis: Secondary | ICD-10-CM | POA: Diagnosis not present

## 2019-08-19 DIAGNOSIS — G8929 Other chronic pain: Secondary | ICD-10-CM

## 2019-08-19 DIAGNOSIS — F3342 Major depressive disorder, recurrent, in full remission: Secondary | ICD-10-CM

## 2019-08-19 NOTE — Assessment & Plan Note (Signed)
Only occurs with eating late and then laying down.  Discussed eating earlier in the day.  Advised not to lay down right after eating.  If this becomes more persistent or recurrent she will let us know.

## 2019-08-19 NOTE — Assessment & Plan Note (Signed)
She will continue to see ophthalmology. 

## 2019-08-19 NOTE — Assessment & Plan Note (Signed)
Mild issue only when she bends over.  Discussed increasing activity and see if that helps.  If not beneficial we could consider physical therapy.

## 2019-08-19 NOTE — Progress Notes (Signed)
Virtual Visit via telephone Note  This visit type was conducted due to national recommendations for restrictions regarding the COVID-19 pandemic (e.g. social distancing).  This format is felt to be most appropriate for this patient at this time.  All issues noted in this document were discussed and addressed.  No physical exam was performed (except for noted visual exam findings with Video Visits).   I connected with Crystal Miller today at 11:00 AM EDT by telephone and verified that I am speaking with the correct person using two identifiers. Location patient: home Location provider: work  Persons participating in the virtual visit: patient, provider, Gillermina Phy (daughter)  I discussed the limitations, risks, security and privacy concerns of performing an evaluation and management service by telephone and the availability of in person appointments. I also discussed with the patient that there may be a patient responsible charge related to this service. The patient expressed understanding and agreed to proceed.  Interactive audio and video telecommunications were attempted between this provider and patient, however failed, due to patient having technical difficulties OR patient did not have access to video capability.  We continued and completed visit with audio only.   Reason for visit: follow-up  HPI: HYPERTENSION  Disease Monitoring  Home BP Monitoring 144/58 Chest pain- no   Dyspnea- no Medications  Compliance-  Taking amlodipine, coreg.  Edema- no  DIABETES Disease Monitoring: Blood Sugar ranges-141 Polyuria/phagia/dipsia- no      Optho- UTD Medications: Compliance- taking tradjenta   Excessive gas: Patient notes she passes (frequently.  No diarrhea.  No abdominal pain.  There are certain foods that make it worse.  She tried Gas-X with no benefit.  Low back pain: Patient notes this only hurts when she bends over a lot for a long period of time.  Otherwise no back pain.   No radiation.  No numbness, weakness, or incontinence.  Tylenol has been beneficial.  She has not been terribly active and thinks that may be contributing.  GERD: Patient notes she only has reflux if she eats late and then lays down afterwards.  Otherwise no reflux symptoms.  No abdominal pain.  No dysphagia.  No blood in her stool.  Depression: Asymptomatic.  On Zoloft.    ROS: See pertinent positives and negatives per HPI.  Past Medical History:  Diagnosis Date  . Acute pyelonephritis 08/01/2018  . Depression    husband died in 08-16-2015  . DM (diabetes mellitus), type 2 with complications ([place]) 9/37/1696  . Essential hypertension 11/01/2016  . GERD (gastroesophageal reflux disease)   . Mixed hyperlipidemia 11/01/2016  . Peripheral vascular disease ([place])   . Renal insufficiency   . Right pontine stroke ([place]) 03/27/2017  . Stroke ([place]) 08-15-16   slight residual with speech. does not drive Lt side weakness    Past Surgical History:  Procedure Laterality Date  . CHOLECYSTECTOMY    . LOWER EXTREMITY ANGIOGRAPHY Left 12/31/2017   Procedure: LOWER EXTREMITY ANGIOGRAPHY;  Surgeon: Katha Cabal, MD;  Location: [facility];  Service: Cardiovascular;  Laterality: Left;  . LOWER EXTREMITY ANGIOGRAPHY Right 04/22/2018   Procedure: LOWER EXTREMITY ANGIOGRAPHY;  Surgeon: Katha Cabal, MD;  Location: [facility];  Service: Cardiovascular;  Laterality: Right;    Family History  Problem Relation Age of Onset  . Alzheimer's disease Mother   . Arthritis Mother   . Hypertension Mother   . CVA Father   . CAD Father   . Heart disease Father   .  Stroke Father   . Hypertension Father   . Stroke Brother   . Breast cancer Neg Hx     SOCIAL HX: Former smoker   Current Outpatient Medications:  .  acetaminophen (TYLENOL) 500 MG tablet, Take 1,000 mg by mouth every 6 (six) hours as needed (for pain.)., Disp: , Rfl:  .  alendronate (FOSAMAX) 70 MG tablet, TAKE 1 TABLET BY  MOUTH ONCE A WEEK WITH  A  FULL  GLASS  OF  WATER  ON  AN  EMPTY  STOMACH, Disp: 12 tablet, Rfl: 0 .  amLODipine (NORVASC) 10 MG tablet, Take 1 tablet by mouth once daily, Disp: 90 tablet, Rfl: 0 .  aspirin EC 81 MG EC tablet, Take 1 tablet (81 mg total) by mouth daily., Disp: 30 tablet, Rfl: 2 .  atorvastatin (LIPITOR) 40 MG tablet, TAKE 1 TABLET BY MOUTH ONCE DAILY AT  6PM, Disp: 90 tablet, Rfl: 0 .  blood glucose meter kit and supplies KIT, Dispense based on patient and insurance preference. Use up to four times daily as directed. (FOR ICD-9 250.00, 250.01)., Disp: 1 each, Rfl: 0 .  carvedilol (COREG) 6.25 MG tablet, Take 1 tablet (6.25 mg total) by mouth 2 (two) times daily with a meal., Disp: 180 tablet, Rfl: 3 .  linagliptin (TRADJENTA) 5 MG TABS tablet, Take 1 tablet (5 mg total) by mouth daily., Disp: 90 tablet, Rfl: 1 .  Multiple Vitamin (MULTIVITAMIN WITH MINERALS) TABS tablet, Take 1 tablet by mouth daily. Centrum Silver . In the morning, Disp: , Rfl:  .  Polyethyl Glycol-Propyl Glycol (LUBRICANT EYE DROPS) 0.4-0.3 % SOLN, Place 1 drop into both eyes 3 (three) times daily as needed (for dry eyes.)., Disp: , Rfl:  .  sertraline (ZOLOFT) 50 MG tablet, Take 0.5 tablets (25 mg total) by mouth daily., Disp: 45 tablet, Rfl: 3 .  solifenacin (VESICARE) 5 MG tablet, Take 1 tablet (5 mg total) by mouth daily., Disp: 30 tablet, Rfl: 1  EXAM: This is a telephone visit and thus no physical exam was completed.   ASSESSMENT AND PLAN:  Discussed the following assessment and plan:  Essential hypertension Adequate control for age.  Diastolic BP limits titration of medications.  She will continue her current regimen.  BMP to be checked with labs.  Diabetic retinopathy ([place]) She will continue to see ophthalmology.  DM (diabetes mellitus), type 2 with complications ([facility]) Check A1c.  Continue current regimen.  Low back pain Mild issue only when she bends over.  Discussed increasing activity and  see if that helps.  If not beneficial we could consider physical therapy.  Depression Asymptomatic.  She will continue Zoloft.  GERD (gastroesophageal reflux disease) Only occurs with eating late and then laying down.  Discussed eating earlier in the day.  Advised not to lay down right after eating.  If this becomes more persistent or recurrent she will let us know.  Excessive gas Suspect related to dietary issues.  We will mail her a FODMAP diet.   No orders of the defined types were placed in this encounter.   No orders of the defined types were placed in this encounter.    I discussed the assessment and treatment plan with the patient. The patient was provided an opportunity to ask questions and all were answered. The patient agreed with the plan and demonstrated an understanding of the instructions.   The patient was advised to call back or seek an in-person evaluation if the symptoms worsen or if  the condition fails to improve as anticipated.  I provided 13 minutes of non-face-to-face time during this encounter.   Tommi Rumps, MD

## 2019-08-19 NOTE — Assessment & Plan Note (Signed)
Check A1c.  Continue current regimen. 

## 2019-08-19 NOTE — Assessment & Plan Note (Signed)
Asymptomatic.  She will continue Zoloft.

## 2019-08-19 NOTE — Patient Instructions (Signed)
Low-FODMAP Eating Plan  FODMAPs (fermentable oligosaccharides, disaccharides, monosaccharides, and polyols) are sugars that are hard for some people to digest. A low-FODMAP eating plan may help some people who have bowel (intestinal) diseases to manage their symptoms. This meal plan can be complicated to follow. Work with a diet and nutrition specialist (dietitian) to make a low-FODMAP eating plan that is right for you. A dietitian can make sure that you get enough nutrition from this diet. What are tips for following this plan? Reading food labels  Check labels for hidden FODMAPs such as: ? High-fructose syrup. ? Honey. ? Agave. ? Natural fruit flavors. ? Onion or garlic powder.  Choose low-FODMAP foods that contain 3-4 grams of fiber per serving.  Check food labels for serving sizes. Eat only one serving at a time to make sure FODMAP levels stay low. Meal planning  Follow a low-FODMAP eating plan for up to 6 weeks, or as told by your health care provider or dietitian.  To follow the eating plan: 1. Eliminate high-FODMAP foods from your diet completely. 2. Gradually reintroduce high-FODMAP foods into your diet one at a time. Most people should wait a few days after introducing one high-FODMAP food before they introduce the next high-FODMAP food. Your dietitian can recommend how quickly you may reintroduce foods. 3. Keep a daily record of what you eat and drink, and make note of any symptoms that you have after eating. 4. Review your daily record with a dietitian regularly. Your dietitian can help you identify which foods you can eat and which foods you should avoid. General tips  Drink enough fluid each day to keep your urine pale yellow.  Avoid processed foods. These often have added sugar and may be high in FODMAPs.  Avoid most dairy products, whole grains, and sweeteners.  Work with a dietitian to make sure you get enough fiber in your diet. Recommended  foods Grains  Gluten-free grains, such as rice, oats, buckwheat, quinoa, corn, polenta, and millet. Gluten-free pasta, bread, or cereal. Rice noodles. Corn tortillas. Vegetables  Eggplant, zucchini, cucumber, peppers, green beans, Brussels sprouts, bean sprouts, lettuce, arugula, kale, Swiss chard, spinach, collard greens, bok choy, summer squash, potato, and tomato. Limited amounts of corn, carrot, and sweet potato. Green parts of scallions. Fruits  Bananas, oranges, lemons, limes, blueberries, raspberries, strawberries, grapes, cantaloupe, honeydew melon, kiwi, papaya, passion fruit, and pineapple. Limited amounts of dried cranberries, banana chips, and shredded coconut. Dairy  Lactose-free milk, yogurt, and kefir. Lactose-free cottage cheese and ice cream. Non-dairy milks, such as almond, coconut, hemp, and rice milk. Yogurts made of non-dairy milks. Limited amounts of goat cheese, brie, mozzarella, parmesan, swiss, and other hard cheeses. Meats and other protein foods  Unseasoned beef, pork, poultry, or fish. Eggs. Bacon. Tofu (firm) and tempeh. Limited amounts of nuts and seeds, such as almonds, walnuts, brazil nuts, pecans, peanuts, pumpkin seeds, chia seeds, and sunflower seeds. Fats and oils  Butter-free spreads. Vegetable oils, such as olive, canola, and sunflower oil. Seasoning and other foods  Artificial sweeteners with names that do not end in "ol" such as aspartame, saccharine, and stevia. Maple syrup, white table sugar, raw sugar, brown sugar, and molasses. Fresh basil, coriander, parsley, rosemary, and thyme. Beverages  Water and mineral water. Sugar-sweetened soft drinks. Small amounts of orange juice or cranberry juice. Black and green tea. Most dry wines. Coffee. This may not be a complete list of low-FODMAP foods. Talk with your dietitian for more information. Foods to avoid Grains  Wheat,   including kamut, durum, and semolina. Barley and bulgur. Couscous. Wheat-based  cereals. Wheat noodles, bread, crackers, and pastries. Vegetables  Chicory root, artichoke, asparagus, cabbage, snow peas, sugar snap peas, mushrooms, and cauliflower. Onions, garlic, leeks, and the white part of scallions. Fruits  Fresh, dried, and juiced forms of apple, pear, watermelon, peach, plum, cherries, apricots, blackberries, boysenberries, figs, nectarines, and mango. Avocado. Dairy  Milk, yogurt, ice cream, and soft cheese. Cream and sour cream. Milk-based sauces. Custard. Meats and other protein foods  Fried or fatty meat. Sausage. Cashews and pistachios. Soybeans, baked beans, black beans, chickpeas, kidney beans, fava beans, navy beans, lentils, and split peas. Seasoning and other foods  Any sugar-free gum or candy. Foods that contain artificial sweeteners such as sorbitol, mannitol, isomalt, or xylitol. Foods that contain honey, high-fructose corn syrup, or agave. Bouillon, vegetable stock, beef stock, and chicken stock. Garlic and onion powder. Condiments made with onion, such as hummus, chutney, pickles, relish, salad dressing, and salsa. Tomato paste. Beverages  Chicory-based drinks. Coffee substitutes. Chamomile tea. Fennel tea. Sweet or fortified wines such as port or sherry. Diet soft drinks made with isomalt, mannitol, maltitol, sorbitol, or xylitol. Apple, pear, and mango juice. Juices with high-fructose corn syrup. This may not be a complete list of high-FODMAP foods. Talk with your dietitian to discuss what dietary choices are best for you.  Summary  A low-FODMAP eating plan is a short-term diet that eliminates FODMAPs from your diet to help ease symptoms of certain bowel diseases.  The eating plan usually lasts up to 6 weeks. After that, high-FODMAP foods are restarted gradually, one at a time, so you can find out which may be causing symptoms.  A low-FODMAP eating plan can be complicated. It is best to work with a dietitian who has experience with this type of  plan. This information is not intended to replace advice given to you by your health care provider. Make sure you discuss any questions you have with your health care provider. Document Revised: 04/19/2017 Document Reviewed: 01/01/2017 Elsevier Patient Education  2020 [employer] Inc.  

## 2019-08-19 NOTE — Assessment & Plan Note (Signed)
Adequate control for age.  Diastolic BP limits titration of medications.  She will continue her current regimen.  BMP to be checked with labs.

## 2019-08-19 NOTE — Assessment & Plan Note (Signed)
Suspect related to dietary issues.  We will mail her a FODMAP diet.

## 2019-08-27 ENCOUNTER — Ambulatory Visit (INDEPENDENT_AMBULATORY_CARE_PROVIDER_SITE_OTHER): Payer: Medicare HMO

## 2019-08-27 ENCOUNTER — Encounter (INDEPENDENT_AMBULATORY_CARE_PROVIDER_SITE_OTHER): Payer: Self-pay | Admitting: Vascular Surgery

## 2019-08-27 ENCOUNTER — Other Ambulatory Visit: Payer: Self-pay

## 2019-08-27 ENCOUNTER — Ambulatory Visit (INDEPENDENT_AMBULATORY_CARE_PROVIDER_SITE_OTHER): Payer: Medicare HMO | Admitting: Vascular Surgery

## 2019-08-27 VITALS — BP 115/74 | HR 69 | Resp 16 | Ht 60.0 in | Wt 152.0 lb

## 2019-08-27 DIAGNOSIS — I739 Peripheral vascular disease, unspecified: Secondary | ICD-10-CM

## 2019-08-27 DIAGNOSIS — K219 Gastro-esophageal reflux disease without esophagitis: Secondary | ICD-10-CM

## 2019-08-27 DIAGNOSIS — I10 Essential (primary) hypertension: Secondary | ICD-10-CM

## 2019-08-27 DIAGNOSIS — E782 Mixed hyperlipidemia: Secondary | ICD-10-CM | POA: Diagnosis not present

## 2019-08-27 DIAGNOSIS — E118 Type 2 diabetes mellitus with unspecified complications: Secondary | ICD-10-CM

## 2019-08-31 ENCOUNTER — Other Ambulatory Visit: Payer: Self-pay | Admitting: Family Medicine

## 2019-09-02 ENCOUNTER — Encounter (INDEPENDENT_AMBULATORY_CARE_PROVIDER_SITE_OTHER): Payer: Self-pay | Admitting: Vascular Surgery

## 2019-09-02 NOTE — Progress Notes (Signed)
MRN : [id number]  Crystal Miller is a 74 y.o. ([birth year]) adult who presents with chief complaint of  Chief Complaint  Patient presents with  . Follow-up    ultrasound follow up   .  History of Present Illness:   The patient returns to the office for followup and review status post angiogram with interventionon 04/22/2018.  1.Percutaneous transluminal angioplastyrightsuperficial femoral and popliteal arteries to 5 mm distally and 6 mm at the origin of the SFA 2.Percutaneous transluminal angioplastyright posterior tibial to 2 mm in its proximal two thirds and then 3 mm from the origin into the tibioperoneal trunk 3.Percutaneous transluminal angioplasty of the right profunda femoris to 4 mm proximally  The patient notes improvement in the lower extremity symptoms. No interval shortening of the patient's claudication distance or rest pain symptoms. Previous wounds have now healed. No new ulcers or wounds have occurred since the last visit.  There have been no significant changes to the patient's overall health care.  The patient denies amaurosis fugax or recent TIA symptoms. There are no recent neurological changes noted. The patient denies history of DVT, PE or superficial thrombophlebitis. The patient denies recent episodes of angina or shortness of breath.   ABI's Rt=0.91and Lt=0.98(previous ABI's Rt=1.07 and Lt=1.08) Duplex ultrasound bilateral lower extremity arterial system demonstrates uniform velocities throughout the right and left lower extremities.  Incidental notation of a moderate stenosis in the right deep femoral.  Current Meds  Medication Sig  . acetaminophen (TYLENOL) 500 MG tablet Take 1,000 mg by mouth every 6 (six) hours as needed (for pain.).  Marland Kitchen alendronate (FOSAMAX) 70 MG tablet TAKE 1 TABLET BY MOUTH ONCE A WEEK WITH  A  FULL  GLASS  OF  WATER  ON  AN  EMPTY  STOMACH  . amLODipine (NORVASC) 10 MG tablet Take 1 tablet by mouth once daily  .  aspirin EC 81 MG EC tablet Take 1 tablet (81 mg total) by mouth daily.  . blood glucose meter kit and supplies KIT Dispense based on patient and insurance preference. Use up to four times daily as directed. (FOR ICD-9 250.00, 250.01).  . carvedilol (COREG) 6.25 MG tablet Take 1 tablet (6.25 mg total) by mouth 2 (two) times daily with a meal.  . linagliptin (TRADJENTA) 5 MG TABS tablet Take 1 tablet (5 mg total) by mouth daily.  . Multiple Vitamin (MULTIVITAMIN WITH MINERALS) TABS tablet Take 1 tablet by mouth daily. Centrum Silver . In the morning  . Polyethyl Glycol-Propyl Glycol (LUBRICANT EYE DROPS) 0.4-0.3 % SOLN Place 1 drop into both eyes 3 (three) times daily as needed (for dry eyes.).  Marland Kitchen sertraline (ZOLOFT) 50 MG tablet Take 0.5 tablets (25 mg total) by mouth daily.  . solifenacin (VESICARE) 5 MG tablet Take 1 tablet (5 mg total) by mouth daily.  . [DISCONTINUED] atorvastatin (LIPITOR) 40 MG tablet TAKE 1 TABLET BY MOUTH ONCE DAILY AT  6PM    Past Medical History:  Diagnosis Date  . Acute pyelonephritis 08/01/2018  . Depression    husband died in 16-Aug-2015  . DM (diabetes mellitus), type 2 with complications ([place]) 0/34/9179  . Essential hypertension 11/01/2016  . GERD (gastroesophageal reflux disease)   . Mixed hyperlipidemia 11/01/2016  . Peripheral vascular disease ([place])   . Renal insufficiency   . Right pontine stroke ([place]) 03/27/2017  . Stroke ([place]) 2016/08/15   slight residual with speech. does not drive Lt side weakness    Past Surgical History:  Procedure Laterality Date  .  CHOLECYSTECTOMY    . LOWER EXTREMITY ANGIOGRAPHY Left 12/31/2017   Procedure: LOWER EXTREMITY ANGIOGRAPHY;  Surgeon: Katha Cabal, MD;  Location: [facility];  Service: Cardiovascular;  Laterality: Left;  . LOWER EXTREMITY ANGIOGRAPHY Right 04/22/2018   Procedure: LOWER EXTREMITY ANGIOGRAPHY;  Surgeon: Katha Cabal, MD;  Location: [facility];  Service: Cardiovascular;  Laterality:  Right;    Social History Social History   Tobacco Use  . Smoking status: Former Smoker    Packs/day: 1.00    Years: 20.00    Pack years: 20.00    Types: Cigarettes    Quit date: 2012    Years since quitting: 9.2  . Smokeless tobacco: Never Used  Substance Use Topics  . Alcohol use: No  . Drug use: No    Family History Family History  Problem Relation Age of Onset  . Alzheimer's disease Mother   . Arthritis Mother   . Hypertension Mother   . CVA Father   . CAD Father   . Heart disease Father   . Stroke Father   . Hypertension Father   . Stroke Brother   . Breast cancer Neg Hx     Allergies  Allergen Reactions  . Metformin And Related Diarrhea     REVIEW OF SYSTEMS (Negative unless checked)  Constitutional: '[]' Weight loss  '[]' Fever  '[]' Chills Cardiac: '[]' Chest pain   '[]' Chest pressure   '[]' Palpitations   '[]' Shortness of breath when laying flat   '[]' Shortness of breath with exertion. Vascular:  '[x]' Pain in legs with walking   '[]' Pain in legs at rest  '[]' History of DVT   '[]' Phlebitis   '[]' Swelling in legs   '[]' Varicose veins   '[]' Non-healing ulcers Pulmonary:   '[]' Uses home oxygen   '[]' Productive cough   '[]' Hemoptysis   '[]' Wheeze  '[]' COPD   '[]' Asthma Neurologic:  '[]' Dizziness   '[]' Seizures   '[]' History of stroke   '[]' History of TIA  '[]' Aphasia   '[]' Vissual changes   '[]' Weakness or numbness in arm   '[]' Weakness or numbness in leg Musculoskeletal:   '[]' Joint swelling   '[]' Joint pain   '[]' Low back pain Hematologic:  '[]' Easy bruising  '[]' Easy bleeding   '[]' Hypercoagulable state   '[]' Anemic Gastrointestinal:  '[]' Diarrhea   '[]' Vomiting  '[]' Gastroesophageal reflux/heartburn   '[]' Difficulty swallowing. Genitourinary:  '[]' Chronic kidney disease   '[]' Difficult urination  '[]' Frequent urination   '[]' Blood in urine Skin:  '[]' Rashes   '[]' Ulcers  Psychological:  '[]' History of anxiety   '[]'  History of major depression.  Physical Examination  Vitals:   08/27/19 1539  BP: 115/74  Pulse: 69  Resp: 16  Weight: 152 lb (68.9  kg)  Height: 5' (1.524 m)   Body mass index is 29.69 kg/m. Gen: WD/WN, NAD Head: [place]/AT, No temporalis wasting.  Ear/Nose/Throat: Hearing grossly intact, nares w/o erythema or drainage Eyes: PER, EOMI, sclera nonicteric.  Neck: Supple, no large masses.   Pulmonary:  Good air movement, no audible wheezing bilaterally, no use of accessory muscles.  Cardiac: RRR, no JVD Vascular:  Vessel Right Left  Radial Palpable Palpable  PT Palpable Trace Palpable  DP Not Palpable Palpable  Gastrointestinal: Non-distended. No guarding/no peritoneal signs.  Musculoskeletal: M/S 5/5 throughout.  No deformity or atrophy.  Neurologic: CN 2-12 intact. Symmetrical.  Speech is fluent. Motor exam as listed above. Psychiatric: Judgment intact, Mood & affect appropriate for pt's clinical situation. Dermatologic: No rashes or ulcers noted.  No changes consistent with cellulitis. Lymph : No lichenification or skin changes of chronic lymphedema.  CBC Lab Results  Component Value Date   WBC 7.5 02/26/2019   HGB 11.8 (L) 02/26/2019   HCT 35.0 (L) 02/26/2019   MCV 93.3 02/26/2019   PLT 300.0 02/26/2019    BMET    Component Value Date/Time   NA 135 12/10/2018 0950   K 4.8 12/10/2018 0950   CL 105 12/10/2018 0950   CO2 22 12/10/2018 0950   GLUCOSE 140 (H) 12/10/2018 0950   BUN 28 (H) 12/10/2018 0950   CREATININE 1.25 (H) 12/10/2018 0950   CALCIUM 9.6 12/10/2018 0950   GFRNONAA 48 (L) 08/02/2018 0422   GFRAA 56 (L) 08/02/2018 0422   CrCl cannot be calculated (Patient's most recent lab result is older than the maximum 21 days allowed.).  COAG No results found for: INR, PROTIME  Radiology VAS [place] ABI WITH/WO TBI  Result Date: 08/27/2019 LOWER EXTREMITY DOPPLER STUDY Indications: Rest pain, and peripheral artery disease.  Vascular Interventions: 12/31/2017 PTA of the SFA and popliteal artery, Fem/pop                         stent, bilateral CIA stents.                          04/22/2018 PTA: Rt SFA ,  Popliteal Artery, PFA, Poterior                         Tibial Artery and Tibial Peroneal Trunk. Comparison Study: 02/19/2019 Performing Technologist: Almira Coaster RVS  Examination Guidelines: A complete evaluation includes at minimum, Doppler waveform signals and systolic blood pressure reading at the level of bilateral brachial, anterior tibial, and posterior tibial arteries, when vessel segments are accessible. Bilateral testing is considered an integral part of a complete examination. Photoelectric Plethysmograph (PPG) waveforms and toe systolic pressure readings are included as required and additional duplex testing as needed. Limited examinations for reoccurring indications may be performed as noted.  ABI Findings: +---------+------------------+-----+----------+--------+ Right    Rt Pressure (mmHg)IndexWaveform  Comment  +---------+------------------+-----+----------+--------+ Brachial 162                                       +---------+------------------+-----+----------+--------+ ATA      115               0.71 monophasic         +---------+------------------+-----+----------+--------+ PTA      147               0.91 monophasic         +---------+------------------+-----+----------+--------+ Great Toe146               0.90 Normal             +---------+------------------+-----+----------+--------+ +---------+------------------+-----+----------+-------+ Left     Lt Pressure (mmHg)IndexWaveform  Comment +---------+------------------+-----+----------+-------+ Brachial 158                                      +---------+------------------+-----+----------+-------+ ATA      148               0.91 monophasic        +---------+------------------+-----+----------+-------+ PTA      158  0.98 monophasic        +---------+------------------+-----+----------+-------+ Great Toe155               0.96 Normal             +---------+------------------+-----+----------+-------+ +-------+-----------+-----------+------------+------------+ ABI/TBIToday's ABIToday's TBIPrevious ABIPrevious TBI +-------+-----------+-----------+------------+------------+ Right  .91        .90        1.07        0.74         +-------+-----------+-----------+------------+------------+ Left   .98        .96        1.07        0.59         +-------+-----------+-----------+------------+------------+ Right ABIs appear decreased compared to prior study on 02/19/2019. Left ABIs appear essentially unchanged compared to prior study on 02/19/2019.  Summary: Right: Resting right ankle-brachial index indicates mild right lower extremity arterial disease. The right toe-brachial index is normal. Left: Resting left ankle-brachial index is within normal range. No evidence of significant left lower extremity arterial disease. The left toe-brachial index is normal.  *See table(s) above for measurements and observations.  Electronically signed by Hortencia Pilar MD on 08/27/2019 at 3:52:32 PM.   Final    VAS [place] LOWER EXTREMITY ARTERIAL DUPLEX  Result Date: 08/27/2019 LOWER EXTREMITY ARTERIAL DUPLEX STUDY Indications: Peripheral artery disease.  Vascular Interventions: 05/19/2018* PTA Rt SFA,Pop, PFA, PTA and TP trunk                         01/17/2018 Bilat CIA PTA and stents. Current ABI:            Rt .91, Lt .98 Comparison Study: 02/19/2019 Performing Technologist: Almira Coaster RVS  Examination Guidelines: A complete evaluation includes B-mode imaging, spectral Doppler, color Doppler, and power Doppler as needed of all accessible portions of each vessel. Bilateral testing is considered an integral part of a complete examination. Limited examinations for reoccurring indications may be performed as noted.  +-----------+--------+-----+--------+----------+--------+ RIGHT      PSV cm/sRatioStenosisWaveform  Comments  +-----------+--------+-----+--------+----------+--------+ CFA Distal 176                  biphasic           +-----------+--------+-----+--------+----------+--------+ DFA        193                  biphasic           +-----------+--------+-----+--------+----------+--------+ SFA Prox   119                  monophasic         +-----------+--------+-----+--------+----------+--------+ SFA Mid    95                   monophasic         +-----------+--------+-----+--------+----------+--------+ SFA Distal 93                   monophasic         +-----------+--------+-----+--------+----------+--------+ POP Distal 106                  monophasic         +-----------+--------+-----+--------+----------+--------+ ATA Distal 49                   monophasic         +-----------+--------+-----+--------+----------+--------+ PTA Distal 88  monophasic         +-----------+--------+-----+--------+----------+--------+ PERO Distal62                   monophasic         +-----------+--------+-----+--------+----------+--------+  +-----------+--------+-----+--------+----------+--------+ LEFT       PSV cm/sRatioStenosisWaveform  Comments +-----------+--------+-----+--------+----------+--------+ CFA Distal 138                  triphasic          +-----------+--------+-----+--------+----------+--------+ DFA        104                  biphasic           +-----------+--------+-----+--------+----------+--------+ SFA Prox   136                  biphasic           +-----------+--------+-----+--------+----------+--------+ SFA Mid    83                   biphasic           +-----------+--------+-----+--------+----------+--------+ SFA Distal 87                   triphasic          +-----------+--------+-----+--------+----------+--------+ POP Distal 125                  biphasic            +-----------+--------+-----+--------+----------+--------+ ATA Distal 76                   monophasic         +-----------+--------+-----+--------+----------+--------+ PTA Distal 101                  monophasic         +-----------+--------+-----+--------+----------+--------+ PERO Distal12                   monophasic         +-----------+--------+-----+--------+----------+--------+  Summary: Right: Imaging and Waveforms obtained throughout in the Right Lower Extremity. Monophasic Flow seen predominantly in the Right Lower Extremity. Left: Imaging and Waveforms obtained throughout in the Left Lower Extremity. Monophasic flow seen at the level of the Ankle.  See table(s) above for measurements and observations. Electronically signed by Hortencia Pilar MD on 08/27/2019 at 3:52:26 PM.    Final      Assessment/Plan 1. PAD (peripheral artery disease) (HCC)  Recommend:  The patient has evidence of atherosclerosis of the lower extremities with claudication.  The patient does not voice lifestyle limiting changes at this point in time.  Noninvasive studies do not suggest clinically significant change.  No invasive studies, angiography or surgery at this time The patient should continue walking and begin a more formal exercise program.  The patient should continue antiplatelet therapy and aggressive treatment of the lipid abnormalities  No changes in the patient's medications at this time  The patient should continue wearing graduated compression socks 10-15 mmHg strength to control the mild edema.   - VAS [place] LOWER EXTREMITY ARTERIAL DUPLEX; Future - VAS [place] ABI WITH/WO TBI; Future  2. Essential hypertension Continue antihypertensive medications as already ordered, these medications have been reviewed and there are no changes at this time.   3. Gastroesophageal reflux disease, unspecified whether esophagitis present Continue PPI as already ordered, this medication has been reviewed  and there are no changes at this time.  Avoidence of caffeine  and alcohol  Moderate elevation of the head of the bed   4. DM (diabetes mellitus), type 2 with complications ([place]) Continue hypoglycemic medications as already ordered, these medications have been reviewed and there are no changes at this time.  Hgb A1C to be monitored as already arranged by primary service   5. Mixed hyperlipidemia Continue statin as ordered and reviewed, no changes at this time     Hortencia Pilar, MD  09/02/2019 5:12 PM

## 2019-09-07 ENCOUNTER — Ambulatory Visit (INDEPENDENT_AMBULATORY_CARE_PROVIDER_SITE_OTHER): Payer: Medicare HMO | Admitting: Pharmacist

## 2019-09-07 DIAGNOSIS — E118 Type 2 diabetes mellitus with unspecified complications: Secondary | ICD-10-CM

## 2019-09-07 DIAGNOSIS — R350 Frequency of micturition: Secondary | ICD-10-CM

## 2019-09-07 DIAGNOSIS — R748 Abnormal levels of other serum enzymes: Secondary | ICD-10-CM

## 2019-09-07 NOTE — Patient Instructions (Signed)
Visit Information  Goals Addressed            This Visit's Progress     Patient Stated   . "I want to work on my diabetes" (pt-stated)       CARE PLAN ENTRY (see longtitudinal plan of care for additional care plan information)  Current Barriers:  . Diabetes: controlled; most recent A1c 6.8%   o S/p f/u with PCP last month. Patient has not scheduled lab work or PCP f/u yey . Current antihyperglycemic regimen: Tradjenta 5 mg daily o Patient has Medicare Extra Help o Hx metformin, did not tolerate d/t diarrhea . Current meal patterns: o Notes she has been trying to stick to FODMAP diet, but still having some concerns w/ gas . 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 Current glucose readings: o Fasting: 124-150s  o 2 hour post prandial: not checking . Cardiovascular risk reduction: o Current hypertensive regimen: carvedilol 6.25 mg BID, amlodipine 10 mg daily; last clinic reading well controlled <130/80 o Current hyperlipidemia regimen: atorvastatin 40 mg daily; last LDL at goal <70 o Current antiplatelet regimen: ASA 81 mg daily; . Overactive bladder: previously saw Dr. Lonna Cobb, prescribed solifenacin for symptoms. It was unaffordable at that time. Since then, she has been approved for Medicare Extra Help, so brand name medications would only be $9.20/90 days. That script is expired  Pharmacist Clinical Goal(s):  Marland Kitchen Over the next 90 days, patient will work with PharmD and primary care provider to address optimized medication management  Interventions: . Comprehensive medication review performed, medication list updated in electronic medical record . Inter-disciplinary care team collaboration (see longitudinal plan of care) . Reviewed sugar readings. Due for A1c. Will collaborate w/ front desk to schedule lab work for this and BMP. Will collaborate w/ Dr. Birdie Sons to see if any other labwork is needed . Provided patient the phone number for [facility].  Will also route my note to Dr. Lonna Cobb  Patient Self Care Activities:  . Patient will check blood glucose daily , document, and provide at future appointments . Patient will take medications as prescribed . Patient will report any questions or concerns to provider   Please see past updates related to this goal by clicking on the "Past Updates" button in the selected goal         Patient verbalizes understanding of instructions provided today.   Plan:  - Scheduled f/u call 12/07/19  [name] Feliz Beam, PharmD, BCACP, CPP Clinical Pharmacist [facility] [employer] [phone]

## 2019-09-07 NOTE — Chronic Care Management (AMB) (Signed)
Chronic Care Management   Follow Up Note   09/07/2019 Name: Crystal Miller MRN: [id number] DOB: 05-Sep-1945  Referred by: Crystal Haven, MD Reason for referral : Chronic Care Management (Medication Management)   Crystal Miller is a 74 y.o. year old adult who is a primary care patient of Crystal Miller, Crystal Adam, MD. The CCM team was consulted for assistance with chronic disease management and care coordination needs.    Contacted patient for medication management review.  Review of patient status, including review of consultants reports, relevant laboratory and other test results, and collaboration with appropriate care team members and the patient's provider was performed as part of comprehensive patient evaluation and provision of chronic care management services.    SDOH (Social Determinants of Health) assessments performed: No See Care Plan activities for detailed interventions related to [facility])     Outpatient Encounter Medications as of 09/07/2019  Medication Sig  . acetaminophen (TYLENOL) 500 MG tablet Take 1,000 mg by mouth every 6 (six) hours as needed (for pain.).  Marland Kitchen alendronate (FOSAMAX) 70 MG tablet TAKE 1 TABLET BY MOUTH ONCE A WEEK WITH  A  FULL  GLASS  OF  WATER  ON  AN  EMPTY  STOMACH  . amLODipine (NORVASC) 10 MG tablet Take 1 tablet by mouth once daily  . aspirin EC 81 MG EC tablet Take 1 tablet (81 mg total) by mouth daily.  Marland Kitchen atorvastatin (LIPITOR) 40 MG tablet TAKE 1 TABLET BY MOUTH ONCE DAILY AT 6PM  . blood glucose meter kit and supplies KIT Dispense based on patient and insurance preference. Use up to four times daily as directed. (FOR ICD-9 250.00, 250.01).  . carvedilol (COREG) 6.25 MG tablet Take 1 tablet (6.25 mg total) by mouth 2 (two) times daily with a meal.  . linagliptin (TRADJENTA) 5 MG TABS tablet Take 1 tablet (5 mg total) by mouth daily.  . Multiple Vitamin (MULTIVITAMIN WITH MINERALS) TABS tablet Take 1 tablet by mouth daily. Centrum Silver . In the  morning  . sertraline (ZOLOFT) 50 MG tablet Take 0.5 tablets (25 mg total) by mouth daily.  Crystal Miller Glycol-Propyl Glycol (LUBRICANT EYE DROPS) 0.4-0.3 % SOLN Place 1 drop into both eyes 3 (three) times daily as needed (for dry eyes.).  Marland Kitchen solifenacin (VESICARE) 5 MG tablet Take 1 tablet (5 mg total) by mouth daily. (Patient not taking: Reported on 09/07/2019)   No facility-administered encounter medications on file as of 09/07/2019.     Objective:   Goals Addressed            This Visit's Progress     Patient Stated   . "I want to work on my diabetes" (pt-stated)       [place] (see longtitudinal plan of care for additional care plan information)  Current Barriers:  . Diabetes: controlled; most recent A1c 6.8%   o S/p f/u with PCP last month. Patient has not scheduled lab work or PCP f/u yey . Current antihyperglycemic regimen: Tradjenta 5 mg daily o Patient has Medicare Extra Help o Hx metformin, did not tolerate d/t diarrhea . Current meal patterns: o Notes she has been trying to stick to FODMAP diet, but still having some concerns w/ gas . 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 Current glucose readings: o Fasting: 124-150s  o 2 hour post prandial: not checking . Cardiovascular risk reduction: o Current hypertensive regimen: carvedilol 6.25 mg BID, amlodipine 10 mg daily; last clinic reading well controlled <  130/80 o Current hyperlipidemia regimen: atorvastatin 40 mg daily; last LDL at goal <70 o Current antiplatelet regimen: ASA 81 mg daily; . Overactive bladder: previously saw Dr. Bernardo Heater, prescribed solifenacin for symptoms. It was unaffordable at that time. Since then, she has been approved for Medicare Extra Help, so brand name medications would only be $9.20/90 days. That script is expired  Pharmacist Clinical Goal(s):  Marland Kitchen Over the next 90 days, patient will work with PharmD and primary care provider to address optimized medication  management  Interventions: . Comprehensive medication review performed, medication list updated in electronic medical record . Inter-disciplinary care team collaboration (see longitudinal plan of care) . Reviewed sugar readings. Due for A1c. Will collaborate w/ front desk to schedule lab work for this and BMP. Will collaborate w/ Dr. Caryl Miller to see if any other labwork is needed . Provided patient the phone number for [facility]. Will also route my note to Dr. Bernardo Heater  Patient Self Care Activities:  . Patient will check blood glucose daily , document, and provide at future appointments . Patient will take medications as prescribed . Patient will report any questions or concerns to provider   Please see past updates related to this goal by clicking on the "Past Updates" button in the selected goal          Plan:  - Scheduled f/u call 12/07/19  [name], PharmD, BCACP, [place] Pharmacist [facility] [phone]

## 2019-09-07 NOTE — Progress Notes (Signed)
I have reviewed the above note and agree. I was available to the pharmacist for consultation. Recheck B12 as well.   Marikay Alar, MD

## 2019-09-07 NOTE — Addendum Note (Signed)
Addended by: Glori Luis on: 09/07/2019 04:49 PM   Modules accepted: Orders

## 2019-09-09 ENCOUNTER — Other Ambulatory Visit: Payer: Self-pay | Admitting: Family Medicine

## 2019-09-09 ENCOUNTER — Other Ambulatory Visit (INDEPENDENT_AMBULATORY_CARE_PROVIDER_SITE_OTHER): Payer: Medicare HMO

## 2019-09-09 ENCOUNTER — Other Ambulatory Visit: Payer: Self-pay

## 2019-09-09 DIAGNOSIS — R748 Abnormal levels of other serum enzymes: Secondary | ICD-10-CM

## 2019-09-09 DIAGNOSIS — E875 Hyperkalemia: Secondary | ICD-10-CM

## 2019-09-09 DIAGNOSIS — E118 Type 2 diabetes mellitus with unspecified complications: Secondary | ICD-10-CM | POA: Diagnosis not present

## 2019-09-09 LAB — HEMOGLOBIN A1C: Hgb A1c MFr Bld: 7.1 % — ABNORMAL HIGH (ref 4.6–6.5)

## 2019-09-09 LAB — BASIC METABOLIC PANEL
BUN: 36 mg/dL — ABNORMAL HIGH (ref 6–23)
CO2: 25 mEq/L (ref 19–32)
Calcium: 9.4 mg/dL (ref 8.4–10.5)
Chloride: 105 mEq/L (ref 96–112)
Creatinine, Ser: 1.51 mg/dL — ABNORMAL HIGH (ref 0.40–1.20)
GFR: 40.63 mL/min — ABNORMAL LOW (ref >60.00)
Glucose, Bld: 193 mg/dL — ABNORMAL HIGH (ref 70–99)
Potassium: 5.4 mEq/L — ABNORMAL HIGH (ref 3.5–5.1)
Sodium: 136 mEq/L (ref 135–145)

## 2019-09-09 LAB — VITAMIN B12: Vitamin B-12: 847 pg/mL (ref 211–911)

## 2019-09-10 ENCOUNTER — Other Ambulatory Visit: Payer: Self-pay | Admitting: Family Medicine

## 2019-09-11 ENCOUNTER — Other Ambulatory Visit: Payer: Self-pay

## 2019-09-11 ENCOUNTER — Other Ambulatory Visit (INDEPENDENT_AMBULATORY_CARE_PROVIDER_SITE_OTHER): Payer: Medicare HMO

## 2019-09-11 DIAGNOSIS — E875 Hyperkalemia: Secondary | ICD-10-CM

## 2019-09-12 LAB — BASIC METABOLIC PANEL
BUN/Creatinine Ratio: 18 (ref 12–28)
BUN: 26 mg/dL (ref 8–27)
CO2: 21 mmol/L (ref 20–29)
Calcium: 9.9 mg/dL (ref 8.7–10.3)
Chloride: 103 mmol/L (ref 96–106)
Creatinine, Ser: 1.47 mg/dL — ABNORMAL HIGH (ref 0.57–1.00)
GFR calc Af Amer: 40 mL/min/{1.73_m2} — ABNORMAL LOW (ref >59)
GFR calc non Af Amer: 35 mL/min/{1.73_m2} — ABNORMAL LOW (ref >59)
Glucose: 207 mg/dL — ABNORMAL HIGH (ref 65–99)
Potassium: 5.2 mmol/L (ref 3.5–5.2)
Sodium: 137 mmol/L (ref 134–144)

## 2019-09-14 ENCOUNTER — Other Ambulatory Visit: Payer: Self-pay | Admitting: Family Medicine

## 2019-09-14 DIAGNOSIS — N1832 Chronic kidney disease, stage 3b: Secondary | ICD-10-CM

## 2019-09-15 ENCOUNTER — Other Ambulatory Visit: Payer: Self-pay | Admitting: Family Medicine

## 2019-09-20 ENCOUNTER — Other Ambulatory Visit: Payer: Self-pay | Admitting: Family Medicine

## 2019-10-06 ENCOUNTER — Other Ambulatory Visit: Payer: Self-pay | Admitting: Family Medicine

## 2019-10-20 ENCOUNTER — Other Ambulatory Visit: Payer: Self-pay | Admitting: Family Medicine

## 2019-10-20 DIAGNOSIS — Z1231 Encounter for screening mammogram for malignant neoplasm of breast: Secondary | ICD-10-CM

## 2019-11-22 ENCOUNTER — Other Ambulatory Visit: Payer: Self-pay | Admitting: Family Medicine

## 2019-11-24 ENCOUNTER — Ambulatory Visit (INDEPENDENT_AMBULATORY_CARE_PROVIDER_SITE_OTHER): Payer: Medicare HMO

## 2019-11-24 VITALS — Ht 60.0 in | Wt 152.0 lb

## 2019-11-24 DIAGNOSIS — Z1211 Encounter for screening for malignant neoplasm of colon: Secondary | ICD-10-CM | POA: Diagnosis not present

## 2019-11-24 DIAGNOSIS — Z0000 Encounter for general adult medical examination without abnormal findings: Secondary | ICD-10-CM | POA: Diagnosis not present

## 2019-11-24 NOTE — Patient Instructions (Addendum)
Ms. Crystal Miller , Thank you for taking time to come for your Medicare Wellness Visit. I appreciate your ongoing commitment to your health goals. Please review the following plan we discussed and let me know if I can assist you in the future.   These are the goals we discussed: Goals      Patient Stated   .  "I want to work on my diabetes" (pt-stated)      CARE PLAN ENTRY (see longtitudinal plan of care for additional care plan information)  Current Barriers:  . Diabetes: controlled; most recent A1c 6.8%   o S/p f/u with PCP last month. Patient has not scheduled lab work or PCP f/u yey . Current antihyperglycemic regimen: Tradjenta 5 mg daily o Patient has Medicare Extra Help o Hx metformin, did not tolerate d/t diarrhea . Current meal patterns: o Notes she has been trying to stick to FODMAP diet, but still having some concerns w/ gas . 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 Current glucose readings: o Fasting: 124-150s  o 2 hour post prandial: not checking . Cardiovascular risk reduction: o Current hypertensive regimen: carvedilol 6.25 mg BID, amlodipine 10 mg daily; last clinic reading well controlled <130/80 o Current hyperlipidemia regimen: atorvastatin 40 mg daily; last LDL at goal <70 o Current antiplatelet regimen: ASA 81 mg daily; . Overactive bladder: previously saw Dr. Lonna Cobb, prescribed solifenacin for symptoms. It was unaffordable at that time. Since then, she has been approved for Medicare Extra Help, so brand name medications would only be $9.20/90 days. That script is expired  Pharmacist Clinical Goal(s):  Marland Kitchen Over the next 90 days, patient will work with PharmD and primary care provider to address optimized medication management  Interventions: . Comprehensive medication review performed, medication list updated in electronic medical record . Inter-disciplinary care team collaboration (see longitudinal plan of care) . Reviewed sugar readings. Due  for A1c. Will collaborate w/ front desk to schedule lab work for this and BMP. Will collaborate w/ Dr. Birdie Sons to see if any other labwork is needed . Provided patient the phone number for [facility]. Will also route my note to Dr. Lonna Cobb  Patient Self Care Activities:  . Patient will check blood glucose daily , document, and provide at future appointments . Patient will take medications as prescribed . Patient will report any questions or concerns to provider   Please see past updates related to this goal by clicking on the "Past Updates" button in the selected goal      .  HEMOGLOBIN A1C < 7 (pt-stated)      Maintain levels       This is a list of the screening recommended for you and due dates:  Health Maintenance  Topic Date Due  . Tetanus Vaccine  Never done  . Complete foot exam   07/31/2018  . Cologuard (Stool DNA test)  11/15/2019  . Eye exam for diabetics  11/13/2019  . Flu Shot  12/20/2019  . Urine Protein Check  02/26/2020  . Hemoglobin A1C  03/10/2020  . DEXA scan (bone density measurement)  Completed  . COVID-19 Vaccine  Completed  . Pneumonia vaccines  Completed   Immunizations Immunization History  Administered Date(s) Administered  . Fluad Quad(high Dose 65+) 02/26/2019  . Influenza, High Dose Seasonal PF 02/19/2018  . PFIZER SARS-COV-2 Vaccination 07/11/2019, 08/04/2019  . Pneumococcal Conjugate-13 10/30/2017  . Pneumococcal Polysaccharide-23 10/02/2016   Keep all routine maintenance appointments.   Next CCM appointment 12/07/19 @  1:30  Follow up 01/06/20 @ 10:30  Advanced directives: DNR on file  Conditions/risks identified: none new  Follow up in one year for your annual wellness visit   Cologuard ordered per patient consent. Follow instructions as directed.    Preventive Care 57 Years and Older, Female Preventive care refers to lifestyle choices and visits with your health care provider that can promote health and wellness. What  does preventive care include?  A yearly physical exam. This is also called an annual well check.  Dental exams once or twice a year.  Routine eye exams. Ask your health care provider how often you should have your eyes checked.  Personal lifestyle choices, including:  Daily care of your teeth and gums.  Regular physical activity.  Eating a healthy diet.  Avoiding tobacco and drug use.  Limiting alcohol use.  Practicing safe sex.  Taking low-dose aspirin every day.  Taking vitamin and mineral supplements as recommended by your health care provider. What happens during an annual well check? The services and screenings done by your health care provider during your annual well check will depend on your age, overall health, lifestyle risk factors, and family history of disease. Counseling  Your health care provider may ask you questions about your:  Alcohol use.  Tobacco use.  Drug use.  Emotional well-being.  Home and relationship well-being.  Sexual activity.  Eating habits.  History of falls.  Memory and ability to understand (cognition).  Work and work Astronomer.  Reproductive health. Screening  You may have the following tests or measurements:  Height, weight, and BMI.  Blood pressure.  Lipid and cholesterol levels. These may be checked every 5 years, or more frequently if you are over 47 years old.  Skin check.  Lung cancer screening. You may have this screening every year starting at age 62 if you have a 30-pack-year history of smoking and currently smoke or have quit within the past 15 years.  Fecal occult blood test (FOBT) of the stool. You may have this test every year starting at age 31.  Flexible sigmoidoscopy or colonoscopy. You may have a sigmoidoscopy every 5 years or a colonoscopy every 10 years starting at age 78.  Hepatitis C blood test.  Hepatitis B blood test.  Sexually transmitted disease (STD) testing.  Diabetes screening.  This is done by checking your blood sugar (glucose) after you have not eaten for a while (fasting). You may have this done every 1-3 years.  Bone density scan. This is done to screen for osteoporosis. You may have this done starting at age 8.  Mammogram. This may be done every 1-2 years. Talk to your health care provider about how often you should have regular mammograms. Talk with your health care provider about your test results, treatment options, and if necessary, the need for more tests. Vaccines  Your health care provider may recommend certain vaccines, such as:  Influenza vaccine. This is recommended every year.  Tetanus, diphtheria, and acellular pertussis (Tdap, Td) vaccine. You may need a Td booster every 10 years.  Zoster vaccine. You may need this after age 19.  Pneumococcal 13-valent conjugate (PCV13) vaccine. One dose is recommended after age 109.  Pneumococcal polysaccharide (PPSV23) vaccine. One dose is recommended after age 33. Talk to your health care provider about which screenings and vaccines you need and how often you need them. This information is not intended to replace advice given to you by your health care provider. Make sure you discuss  any questions you have with your health care provider. Document Released: 06/03/2015 Document Revised: 01/25/2016 Document Reviewed: 03/08/2015 Elsevier Interactive Patient Education  2017 [employer].  Fall Prevention in the Home Falls can cause injuries. They can happen to people of all ages. There are many things you can do to make your home safe and to help prevent falls. What can I do on the outside of my home?  Regularly fix the edges of walkways and driveways and fix any cracks.  Remove anything that might make you trip as you walk through a door, such as a raised step or threshold.  Trim any bushes or trees on the path to your home.  Use bright outdoor lighting.  Clear any walking paths of anything that might  make someone trip, such as rocks or tools.  Regularly check to see if handrails are loose or broken. Make sure that both sides of any steps have handrails.  Any raised decks and porches should have guardrails on the edges.  Have any leaves, snow, or ice cleared regularly.  Use sand or salt on walking paths during winter.  Clean up any spills in your garage right away. This includes oil or grease spills. What can I do in the bathroom?  Use night lights.  Install grab bars by the toilet and in the tub and shower. Do not use towel bars as grab bars.  Use non-skid mats or decals in the tub or shower.  If you need to sit down in the shower, use a plastic, non-slip stool.  Keep the floor dry. Clean up any water that spills on the floor as soon as it happens.  Remove soap buildup in the tub or shower regularly.  Attach bath mats securely with double-sided non-slip rug tape.  Do not have throw rugs and other things on the floor that can make you trip. What can I do in the bedroom?  Use night lights.  Make sure that you have a light by your bed that is easy to reach.  Do not use any sheets or blankets that are too big for your bed. They should not hang down onto the floor.  Have a firm chair that has side arms. You can use this for support while you get dressed.  Do not have throw rugs and other things on the floor that can make you trip. What can I do in the kitchen?  Clean up any spills right away.  Avoid walking on wet floors.  Keep items that you use a lot in easy-to-reach places.  If you need to reach something above you, use a strong step stool that has a grab bar.  Keep electrical cords out of the way.  Do not use floor polish or wax that makes floors slippery. If you must use wax, use non-skid floor wax.  Do not have throw rugs and other things on the floor that can make you trip. What can I do with my stairs?  Do not leave any items on the stairs.  Make sure  that there are handrails on both sides of the stairs and use them. Fix handrails that are broken or loose. Make sure that handrails are as long as the stairways.  Check any carpeting to make sure that it is firmly attached to the stairs. Fix any carpet that is loose or worn.  Avoid having throw rugs at the top or bottom of the stairs. If you do have throw rugs, attach them to the  floor with carpet tape.  Make sure that you have a light switch at the top of the stairs and the bottom of the stairs. If you do not have them, ask someone to add them for you. What else can I do to help prevent falls?  Wear shoes that:  Do not have high heels.  Have rubber bottoms.  Are comfortable and fit you well.  Are closed at the toe. Do not wear sandals.  If you use a stepladder:  Make sure that it is fully opened. Do not climb a closed stepladder.  Make sure that both sides of the stepladder are locked into place.  Ask someone to hold it for you, if possible.  Clearly mark and make sure that you can see:  Any grab bars or handrails.  First and last steps.  Where the edge of each step is.  Use tools that help you move around (mobility aids) if they are needed. These include:  Canes.  Walkers.  Scooters.  Crutches.  Turn on the lights when you go into a dark area. Replace any light bulbs as soon as they burn out.  Set up your furniture so you have a clear path. Avoid moving your furniture around.  If any of your floors are uneven, fix them.  If there are any pets around you, be aware of where they are.  Review your medicines with your doctor. Some medicines can make you feel dizzy. This can increase your chance of falling. Ask your doctor what other things that you can do to help prevent falls. This information is not intended to replace advice given to you by your health care provider. Make sure you discuss any questions you have with your health care provider. Document  Released: 03/03/2009 Document Revised: 10/13/2015 Document Reviewed: 06/11/2014 Elsevier Interactive Patient Education  2017 [employer]

## 2019-11-24 NOTE — Progress Notes (Signed)
Subjective:   Crystal Miller is a 74 y.o. female who presents for Medicare Annual (Subsequent) preventive examination.  Review of Systems    No ROS.  Medicare Wellness Virtual Visit.     Cardiac Risk Factors include: advanced age (>61mn, >>67women);hypertension;diabetes mellitus     Objective:    Today's Vitals   11/24/19 1134  Weight: 152 lb (68.9 kg)  Height: 5' (1.524 m)   Body mass index is 29.69 kg/m.  Advanced Directives 11/24/2019 11/20/2018 08/01/2018 08/01/2018 12/31/2017 10/30/2017 03/14/2017  Does Patient Have a Medical Advance Directive? Yes Yes Yes Yes No Yes Yes  Type of Advance Directive Out of facility DNR (pink MOST or yellow form) Out of facility DNR (pink MOST or yellow form) Out of facility DNR (pink MOST or yellow form) Out of facility DNR (pink MOST or yellow form) - HPress photographerOut of facility DNR (pink MOST or yellow form) [place] will  Does patient want to make changes to medical advance directive? No - Patient declined No - Patient declined No - Patient declined No - Patient declined - No - Patient declined -  Copy of [place]? - - - - - No - copy requested Yes  Would patient like information on creating a medical advance directive? - - - - No - Patient declined - -  Pre-existing out of facility DNR order (yellow form or pink MOST form) - - Yellow form placed in chart (order not valid for inpatient use) Yellow form placed in chart (order not valid for inpatient use) - - -    Current Medications (verified) Outpatient Encounter Medications as of 11/24/2019  Medication Sig  . acetaminophen (TYLENOL) 500 MG tablet Take 1,000 mg by mouth every 6 (six) hours as needed (for pain.).  .Marland Kitchenalendronate (FOSAMAX) 70 MG tablet TAKE 1 TABLET BY MOUTH ONCE A WEEK WITH  A  FULL  GLASS  OF  WATER  ON  AN  EMPTY  STOMACH  . amLODipine (NORVASC) 10 MG tablet Take 1 tablet by mouth once daily  . aspirin EC 81  MG EC tablet Take 1 tablet (81 mg total) by mouth daily.  .Marland Kitchenatorvastatin (LIPITOR) 40 MG tablet TAKE 1 TABLET BY MOUTH ONCE DAILY AT  6PM  . blood glucose meter kit and supplies KIT Dispense based on patient and insurance preference. Use up to four times daily as directed. (FOR ICD-9 250.00, 250.01).  . carvedilol (COREG) 6.25 MG tablet TAKE 1 TABLET BY MOUTH TWICE DAILY WITH MEALS  . linagliptin (TRADJENTA) 5 MG TABS tablet Take 1 tablet (5 mg total) by mouth daily.  . Multiple Vitamin (MULTIVITAMIN WITH MINERALS) TABS tablet Take 1 tablet by mouth daily. Centrum Silver . In the morning  . Polyethyl Glycol-Propyl Glycol (LUBRICANT EYE DROPS) 0.4-0.3 % SOLN Place 1 drop into both eyes 3 (three) times daily as needed (for dry eyes.).  .Marland Kitchensertraline (ZOLOFT) 50 MG tablet Take 0.5 tablets (25 mg total) by mouth daily.  . solifenacin (VESICARE) 5 MG tablet Take 1 tablet (5 mg total) by mouth daily. (Patient not taking: Reported on 09/07/2019)   No facility-administered encounter medications on file as of 11/24/2019.    Allergies (verified) Metformin and related   History: Past Medical History:  Diagnosis Date  . Acute pyelonephritis 08/01/2018  . Depression    husband died in 203-13-2017 . DM (diabetes mellitus), type 2 with complications ([place] 60/01/3817 . Essential hypertension 11/01/2016  .  GERD (gastroesophageal reflux disease)   . Mixed hyperlipidemia 11/01/2016  . Peripheral vascular disease ([place])   . Renal insufficiency   . Right pontine stroke ([place]) 03/27/2017  . Stroke ([place]) 2018   slight residual with speech. does not drive Lt side weakness   Past Surgical History:  Procedure Laterality Date  . CHOLECYSTECTOMY    . LOWER EXTREMITY ANGIOGRAPHY Left 12/31/2017   Procedure: LOWER EXTREMITY ANGIOGRAPHY;  Surgeon: Katha Cabal, MD;  Location: [facility];  Service: Cardiovascular;  Laterality: Left;  . LOWER EXTREMITY ANGIOGRAPHY Right 04/22/2018   Procedure: LOWER EXTREMITY  ANGIOGRAPHY;  Surgeon: Katha Cabal, MD;  Location: [facility];  Service: Cardiovascular;  Laterality: Right;   Family History  Problem Relation Age of Onset  . Alzheimer's disease Mother   . Arthritis Mother   . Hypertension Mother   . CVA Father   . CAD Father   . Heart disease Father   . Stroke Father   . Hypertension Father   . Stroke Brother   . Breast cancer Neg Hx    Social History   Socioeconomic History  . Marital status: Widowed    Spouse name: Not on file  . Number of children: Not on file  . Years of education: Not on file  . Highest education level: Not on file  Occupational History  . Occupation: Scientist, water quality    Comment: retired  Tobacco Use  . Smoking status: Former Smoker    Packs/day: 1.00    Years: 20.00    Pack years: 20.00    Types: Cigarettes    Quit date: 2012    Years since quitting: 9.5  . Smokeless tobacco: Never Used  Vaping Use  . Vaping Use: Never used  Substance and Sexual Activity  . Alcohol use: No  . Drug use: No  . Sexual activity: Not Currently    Birth control/protection: Post-menopausal  Other Topics Concern  . Not on file  Social History Narrative  . Not on file   Social Determinants of Health   Financial Resource Strain:   . Difficulty of Paying Living Expenses:   Food Insecurity:   . Worried About Charity fundraiser in the Last Year:   . Arboriculturist in the Last Year:   Transportation Needs:   . Film/video editor (Medical):   Marland Kitchen Lack of Transportation (Non-Medical):   Physical Activity:   . Days of Exercise per Week:   . Minutes of Exercise per Session:   Stress:   . Feeling of Stress :   Social Connections:   . Frequency of Communication with Friends and Family:   . Frequency of Social Gatherings with Friends and Family:   . Attends Religious Services:   . Active Member of Clubs or Organizations:   . Attends Archivist Meetings:   Marland Kitchen Marital Status:     Tobacco  Counseling Counseling given: Not Answered   Clinical Intake:  Pre-visit preparation completed: Yes           How often do you need to have someone help you when you read instructions, pamphlets, or other written materials from your doctor or pharmacy?: 1 - Never    Interpreter Needed?: No      Activities of Daily Living In your present state of health, do you have any difficulty performing the following activities: 11/24/2019  Hearing? N  Vision? N  Difficulty concentrating or making decisions? N  Walking  or climbing stairs? Y  Dressing or bathing? N  Doing errands, shopping? Y  Comment She does not Physiological scientist and eating ? N  Using the Toilet? N  In the past six months, have you accidently leaked urine? Y  Comment Managed with daily brief.  Do you have problems with loss of bowel control? N  Managing your Medications? N  Comment Daughter assists as needed  Managing your Finances? N  Housekeeping or managing your Housekeeping? N  Some recent data might be hidden    Patient Care Team: [name], MD as PCP - General (Family Medicine) De Hollingshead, [facility] as Pharmacist (Pharmacist)  Indicate any recent Medical Services you may have received from other than [facility] providers in the past year (date may be approximate).     Assessment:   This is a routine wellness examination for Merwin.  I connected with Zissel today by telephone and verified that I am speaking with the correct person using two identifiers. Location patient: home Location provider: work Persons participating in the virtual visit: patient, Marine scientist.    I discussed the limitations, risks, security and privacy concerns of performing an evaluation and management service by telephone and the availability of in person appointments. The patient expressed understanding and verbally consented to this telephonic visit.    Interactive audio and video telecommunications were attempted between  this provider and patient, however failed, due to patient having technical difficulties OR patient did not have access to video capability.  We continued and completed visit with audio only.  Some vital signs may be absent or patient reported.   Hearing/Vision screen  Hearing Screening   '125Hz'  '250Hz'  '500Hz'  '1000Hz'  '2000Hz'  '3000Hz'  '4000Hz'  '6000Hz'  '8000Hz'   Right ear:           Left ear:           Comments: Patient is able to hear conversational tones without difficulty.  No issues reported.  Vision Screening Comments: Followed by [facility] Wears corrective lenses No retinopathy reported Visual acuity not assessed, virtual visit.  They have seen their ophthalmologist in the last 12 months.     Dietary issues and exercise activities discussed: Current Exercise Habits: Home exercise routine, Type of exercise: walking, Time (Minutes): 45, Frequency (Times/Week): 3, Weekly Exercise (Minutes/Week): 135, Intensity: MildLow Carb Diet Good water intake Caffeine- none  Goals      Patient Stated   .  "I want to work on my diabetes" (pt-stated)      [place] (see longtitudinal plan of care for additional care plan information)  Current Barriers:  . Diabetes: controlled; most recent A1c 6.8%   o S/p f/u with PCP last month. Patient has not scheduled lab work or PCP f/u yey . Current antihyperglycemic regimen: Tradjenta 5 mg daily o Patient has Medicare Extra Help o Hx metformin, did not tolerate d/t diarrhea . Current meal patterns: o Notes she has been trying to stick to FODMAP diet, but still having some concerns w/ gas . 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 Current glucose readings: o Fasting: 124-150s  o 2 hour post prandial: not checking . Cardiovascular risk reduction: o Current hypertensive regimen: carvedilol 6.25 mg BID, amlodipine 10 mg daily; last clinic reading well controlled <130/80 o Current hyperlipidemia regimen: atorvastatin 40 mg  daily; last LDL at goal <70 o Current antiplatelet regimen: ASA 81 mg daily; . Overactive bladder: previously saw Dr. Bernardo Heater, prescribed solifenacin for symptoms. It  was unaffordable at that time. Since then, she has been approved for Medicare Extra Help, so brand name medications would only be $9.20/90 days. That script is expired  Pharmacist Clinical Goal(s):  Marland Kitchen Over the next 90 days, patient will work with PharmD and primary care provider to address optimized medication management  Interventions: . Comprehensive medication review performed, medication list updated in electronic medical record . Inter-disciplinary care team collaboration (see longitudinal plan of care) . Reviewed sugar readings. Due for A1c. Will collaborate w/ front desk to schedule lab work for this and BMP. Will collaborate w/ Dr. [name] to see if any other labwork is needed . Provided patient the phone number for [facility]. Will also route my note to Dr. Bernardo Heater  Patient Self Care Activities:  . Patient will check blood glucose daily , document, and provide at future appointments . Patient will take medications as prescribed . Patient will report any questions or concerns to provider   Please see past updates related to this goal by clicking on the "Past Updates" button in the selected goal      .  HEMOGLOBIN A1C < 7 (pt-stated)      Maintain levels      Depression Screen PHQ 2/9 Scores 11/24/2019 08/19/2019 05/18/2019 02/10/2019 11/20/2018 06/03/2018 06/03/2018  PHQ - 2 Score 0 0 0 0 0 2 2  PHQ- 9 Score - - - - - 9 -    Fall Risk Fall Risk  11/24/2019 08/19/2019 05/18/2019 02/10/2019 11/20/2018  Falls in the past year? 0 0 0 0 0  Number falls in past yr: 0 0 - 0 -  Injury with Fall? - - - - -  Follow up Falls evaluation completed Falls evaluation completed - Falls evaluation completed -   Handrails in use when climbing stairs? Yes  Home free of loose throw rugs in walkways, pet beds, electrical cords,  etc? Yes  Adequate lighting in your home to reduce risk of falls? Yes   ASSISTIVE DEVICES UTILIZED TO PREVENT FALLS:  Life alert? No  Use of a cane, walker or w/c? Yes , rolling walker Grab bars in the bathroom? Yes  Shower chair or bench in shower? Yes  Elevated toilet seat or a handicapped toilet? Yes   TIMED UP AND GO:  Was the test performed? No . Virtual visit.  Cognitive Function:     6CIT Screen 11/24/2019 11/20/2018 10/30/2017  What Year? 0 points 0 points 0 points  What month? 0 points 0 points 0 points  What time? - 0 points 0 points  Count back from 20 0 points 0 points 0 points  Months in reverse 0 points 0 points 0 points  Repeat phrase 0 points 0 points 0 points  Total Score - 0 0   Immunizations Immunization History  Administered Date(s) Administered  . Fluad Quad(high Dose 65+) 02/26/2019  . Influenza, High Dose Seasonal PF 02/19/2018  . PFIZER SARS-COV-2 Vaccination 07/11/2019, 08/04/2019  . Pneumococcal Conjugate-13 10/30/2017  . Pneumococcal Polysaccharide-23 10/02/2016   TDAP status: Due, Education has been provided regarding the importance of this vaccine. Advised may receive this vaccine at local pharmacy or Health Dept. Aware to provide a copy of the vaccination record if obtained from local pharmacy or Health Dept. Verbalized acceptance and understanding.  Shingrix Completed?: No.    Education has been provided regarding the importance of this vaccine. Patient has been advised to call insurance company to determine out of pocket expense if they have not yet received  this vaccine. Advised may also receive vaccine at local pharmacy or Health Dept. Verbalized acceptance and understanding.  Health Maintenance Health Maintenance  Topic Date Due  . TETANUS/TDAP  Never done  . FOOT EXAM  07/31/2018  . Fecal DNA (Cologuard)  11/15/2019  . OPHTHALMOLOGY EXAM  11/13/2019  . INFLUENZA VACCINE  12/20/2019  . URINE MICROALBUMIN  02/26/2020  . HEMOGLOBIN A1C   03/10/2020  . DEXA SCAN  Completed  . COVID-19 Vaccine  Completed  . PNA vac Low Risk Adult  Completed   Cologuard- consent given to order.  Last completed 11/14/16.   Foot exam- denies wounds or any worsening changes with neuropathy. Followed by pcp.   Ophthalmology exam- scheduled.   Dental Screening: Recommended annual dental exams for proper oral hygiene  Community Resource Referral / Chronic Care Management: CRR required this visit?  No   CCM required this visit?  No      Plan:   Keep all routine maintenance appointments.   Next CCM appointment 12/07/19 @ 1:30  Follow up 01/06/20 @ 10:30  I have personally reviewed and noted the following in the patient's chart:   . Medical and social history . Use of alcohol, tobacco or illicit drugs  . Current medications and supplements . Functional ability and status . Nutritional status . Physical activity . Advanced directives . List of other physicians . Hospitalizations, surgeries, and ER visits in previous 12 months . Vitals . Screenings to include cognitive, depression, and falls . Referrals and appointments  In addition, I have reviewed and discussed with patient certain preventive protocols, quality metrics, and best practice recommendations. A written personalized care plan for preventive services as well as general preventive health recommendations were provided to patient via mail.     Varney Biles, LPN   10/28/5070

## 2019-11-26 ENCOUNTER — Telehealth: Payer: Self-pay | Admitting: Family Medicine

## 2019-11-26 NOTE — Chronic Care Management (AMB) (Signed)
  Care Management   Note  11/26/2019 Name: Crystal Miller MRN: [id number] DOB: [birth year]  Crystal Miller is a 74 y.o. year old adult who is a primary care patient of Birdie Sons, Yehuda Mao, MD and is actively engaged with the care management team. I reached out to Crystal Miller by phone today to assist with re-scheduling a follow up visit with the Pharmacist  Follow up plan: Telephone appointment with care management team member scheduled for:12/23/2019  Crystal Miller, RMA Care Guide, Embedded Care Coordination [facility]  [place], [place] [address] Direct Dial: [phone] [email or web address]

## 2019-11-30 ENCOUNTER — Other Ambulatory Visit: Payer: Self-pay | Admitting: Family Medicine

## 2019-11-30 DIAGNOSIS — Z1211 Encounter for screening for malignant neoplasm of colon: Secondary | ICD-10-CM

## 2019-12-02 ENCOUNTER — Other Ambulatory Visit: Payer: Self-pay | Admitting: Nephrology

## 2019-12-02 DIAGNOSIS — R8281 Pyuria: Secondary | ICD-10-CM | POA: Diagnosis not present

## 2019-12-02 DIAGNOSIS — R808 Other proteinuria: Secondary | ICD-10-CM | POA: Diagnosis not present

## 2019-12-02 DIAGNOSIS — N179 Acute kidney failure, unspecified: Secondary | ICD-10-CM | POA: Diagnosis not present

## 2019-12-02 DIAGNOSIS — I129 Hypertensive chronic kidney disease with stage 1 through stage 4 chronic kidney disease, or unspecified chronic kidney disease: Secondary | ICD-10-CM | POA: Insufficient documentation

## 2019-12-02 DIAGNOSIS — I10 Essential (primary) hypertension: Secondary | ICD-10-CM | POA: Diagnosis not present

## 2019-12-02 DIAGNOSIS — N1832 Chronic kidney disease, stage 3b: Secondary | ICD-10-CM

## 2019-12-02 DIAGNOSIS — R600 Localized edema: Secondary | ICD-10-CM | POA: Insufficient documentation

## 2019-12-03 ENCOUNTER — Ambulatory Visit
Admission: RE | Admit: 2019-12-03 | Discharge: 2019-12-03 | Disposition: A | Discharge status: Home / Self Care | Payer: Medicare HMO | Source: Ambulatory Visit | Attending: Family Medicine | Admitting: Family Medicine

## 2019-12-03 DIAGNOSIS — Z1231 Encounter for screening mammogram for malignant neoplasm of breast: Secondary | ICD-10-CM | POA: Insufficient documentation

## 2019-12-07 ENCOUNTER — Telehealth: Payer: Medicare HMO

## 2019-12-09 ENCOUNTER — Ambulatory Visit
Admission: RE | Admit: 2019-12-09 | Discharge: 2019-12-09 | Disposition: A | Discharge status: Home / Self Care | Payer: Medicare HMO | Source: Ambulatory Visit | Attending: Nephrology | Admitting: Nephrology

## 2019-12-09 ENCOUNTER — Other Ambulatory Visit: Payer: Self-pay

## 2019-12-09 DIAGNOSIS — N281 Cyst of kidney, acquired: Secondary | ICD-10-CM | POA: Diagnosis not present

## 2019-12-09 DIAGNOSIS — N183 Chronic kidney disease, stage 3 unspecified: Secondary | ICD-10-CM | POA: Diagnosis not present

## 2019-12-09 DIAGNOSIS — N1832 Chronic kidney disease, stage 3b: Secondary | ICD-10-CM | POA: Insufficient documentation

## 2019-12-09 DIAGNOSIS — R808 Other proteinuria: Secondary | ICD-10-CM | POA: Diagnosis not present

## 2019-12-14 ENCOUNTER — Other Ambulatory Visit: Payer: Self-pay | Admitting: Family Medicine

## 2019-12-23 ENCOUNTER — Ambulatory Visit (INDEPENDENT_AMBULATORY_CARE_PROVIDER_SITE_OTHER): Payer: Medicare HMO | Admitting: Pharmacist

## 2019-12-23 DIAGNOSIS — E118 Type 2 diabetes mellitus with unspecified complications: Secondary | ICD-10-CM | POA: Diagnosis not present

## 2019-12-23 DIAGNOSIS — I10 Essential (primary) hypertension: Secondary | ICD-10-CM | POA: Diagnosis not present

## 2019-12-23 DIAGNOSIS — N1832 Chronic kidney disease, stage 3b: Secondary | ICD-10-CM

## 2019-12-23 NOTE — Chronic Care Management (AMB) (Signed)
Chronic Care Management   Follow Up Note   12/23/2019 Name: Crystal Miller MRN: [id number] DOB: [birth year]  Referred by: [name], MD Reason for referral : Chronic Care Management (Medication Management)   Crystal Miller is a 74 y.o. year old adult who is a primary care patient of [name], [name], MD. The CCM team was consulted for assistance with chronic disease management and care coordination needs.    Contacted patient for medication management review.  Review of patient status, including review of consultants reports, relevant laboratory and other test results, and collaboration with appropriate care team members and the patient's provider was performed as part of comprehensive patient evaluation and provision of chronic care management services.    SDOH (Social Determinants of Health) assessments performed: Yes See Care Plan activities for detailed interventions related to SDOH)  SDOH Interventions     Most Recent Value  SDOH Interventions  Food Insecurity Interventions Intervention Not Indicated  Financial Strain Interventions Intervention Not Indicated  Physical Activity Interventions Other (Comments)  [Encouraged continued walking]       Outpatient Encounter Medications as of 12/23/2019  Medication Sig  . acetaminophen (TYLENOL) 500 MG tablet Take 1,000 mg by mouth every 6 (six) hours as needed (for pain.).  Marland Kitchen alendronate (FOSAMAX) 70 MG tablet TAKE 1 TABLET BY MOUTH ONCE A WEEK WITH  A  FULL  GLASS  OF  WATER  ON  AN  EMPTY  STOMACH  . amLODipine (NORVASC) 10 MG tablet Take 1 tablet by mouth once daily  . aspirin EC 81 MG EC tablet Take 1 tablet (81 mg total) by mouth daily.  Marland Kitchen atorvastatin (LIPITOR) 40 MG tablet TAKE 1 TABLET BY MOUTH ONCE DAILY AT  6PM  . blood glucose meter kit and supplies KIT Dispense based on patient and insurance preference. Use up to four times daily as directed. (FOR ICD-9 250.00, 250.01).  . carvedilol (COREG) 6.25 MG tablet TAKE 1  TABLET BY MOUTH TWICE DAILY WITH MEALS  . diclofenac Sodium (VOLTAREN) 1 % GEL Apply 2 g topically 4 (four) times daily.  Marland Kitchen linagliptin (TRADJENTA) 5 MG TABS tablet Take 1 tablet (5 mg total) by mouth daily.  . Multiple Vitamin (MULTIVITAMIN WITH MINERALS) TABS tablet Take 1 tablet by mouth daily. Centrum Silver . In the morning  . Polyethyl Glycol-Propyl Glycol (LUBRICANT EYE DROPS) 0.4-0.3 % SOLN Place 1 drop into both eyes 3 (three) times daily as needed (for dry eyes.).  Marland Kitchen sertraline (ZOLOFT) 50 MG tablet Take 0.5 tablets (25 mg total) by mouth daily.  . [DISCONTINUED] solifenacin (VESICARE) 5 MG tablet Take 1 tablet (5 mg total) by mouth daily. (Patient not taking: Reported on 09/07/2019)   No facility-administered encounter medications on file as of 12/23/2019.     Objective:   Goals Addressed              This Visit's Progress     Patient Stated   .  "I want to work on my diabetes" (pt-stated)        CARE PLAN ENTRY (see longtitudinal plan of care for additional care plan information)  Current Barriers:  . Social, financial, and community barriers:  o Patient reports she has been doing well recently. She and her sister have been going over to the mall to walk a few days per week. Enjoying doing exercises every morning as well o No financial concerns today. Patient has Medicare Extra Help - generic copays $3.70/90 day and brand copays $9.20/90 days  o Reports a "good" visit w/ nephrology a few weeks ago.  . Most recent eGFR ~46 mL/min per nephroogy . Diabetes: uncontrolled; most recent A1c 7.1%   . Current antihyperglycemic regimen: Tradjenta 5 mg daily - reports that PCP recommended changing to a once weekly injectable, and she has been thinking about it, but is interested. o Hx metformin, did not tolerate d/t diarrhea . Endorses polyuria, but still endorses urinary incontinence issues  . Current glucose readings: o Fasting: reports highest was 170; reports usually in  150s o 2 hour post prandial: not checking . Cardiovascular risk reduction: o Current hypertensive regimen: carvedilol 6.25 mg BID, amlodipine 10 mg daily; BP at goal SBP >140 at last clinic visit w/ nephrology o Current hyperlipidemia regimen: atorvastatin 40 mg daily; last LDL at goal <70 o Current antiplatelet regimen: ASA 81 mg daily; . Osteoporosis: alendronate 70 mg weekly; notes she is taking Centrum Silver Womens MVI, has Vitamin D 1000 mcg and Calcium 300 mg, takes QAM, at least 45 minutes after alendronate on Sundays . Overactive bladder: previously saw Dr. Bernardo Heater, prescribed solifenacin for symptoms. It was unaffordable at that time. Wonders if she needs to f/u.   Pharmacist Clinical Goal(s):  Marland Kitchen Over the next 90 days, patient will work with PharmD and primary care provider to address optimized medication management  Interventions: . Comprehensive medication review performed, medication list updated in electronic medical record . Inter-disciplinary care team collaboration (see longitudinal plan of care) . Discussed goal A1c, goal fasting sugars. Could consider SGLT2 for renal benefit, pending next eGFR. May not provide significant glycemic benefit if eGFR <45 mL/min, but does have evidence for safety. Discussed changing to weekly GLP1. Would recommend Trulicity d/t ease of injection. Patient has PCP visit in 2 weeks, will determine next steps at that time and provide Trulicity teaching if this route is taken.  . Discussed appropriate calcium + vitamin D supplementation w/ osteoporosis therapy. Recommended patient take a separate calcium supplement ~500 mg QPM, as she does not have a regular source of dietary supplementation daily. She verbalized understanding and will start doing this.   Patient Self Care Activities:  . Patient will check blood glucose daily , document, and provide at future appointments . Patient will take medications as prescribed . Patient will report any questions  or concerns to provider   Please see past updates related to this goal by clicking on the "Past Updates" button in the selected goal          Plan:  - Scheduled f/u face to face in 2 weeks  [name], PharmD, [place], [place] Pharmacist [facility] [place] [phone]

## 2019-12-23 NOTE — Patient Instructions (Signed)
Ms. Edmundson,   It was great talking with you today!  I'm looking forward to meeting you when you come to see Dr. [name] in two weeks. We will discuss next steps for your diabetes. For now, continue Tradjenta 5 mg every day and check your blood sugar daily. If you can check fasting readings and some readings about 2 hours after the largest meal of your day, that would be great!  Start taking calcium (about 400-600 mg tablet) or drink a glass of milk/eat a serving of cheese every evening. Keep taking your multivitamin (with 1000 units of Vitamin D and 300 mg of calcium) every morning. This will help make sure you are getting enough calcium and vitamin D to allow your alendronate (Fosamax) to work to keep your bones strong.   Call me if you have any questions or concerns in the meantime!  [name], PharmD, [place]  Visit Information  Goals Addressed              This Visit's Progress     Patient Stated   .  "I want to work on my diabetes" (pt-stated)        CARE PLAN ENTRY (see longtitudinal plan of care for additional care plan information)  Current Barriers:  . Social, financial, and community barriers:  o Patient reports she has been doing well recently. She and her sister have been going over to the mall to walk a few days per week. Enjoying doing exercises every morning as well o No financial concerns today. Patient has Medicare Extra Help - generic copays $3.70/90 day and brand copays $9.20/90 days o Reports a "good" visit w/ nephrology a few weeks ago.  . Most recent eGFR ~46 mL/min per nephroogy . Diabetes: uncontrolled; most recent A1c 7.1%   . Current antihyperglycemic regimen: Tradjenta 5 mg daily - reports that PCP recommended changing to a once weekly injectable, and she has been thinking about it, but is interested. o Hx metformin, did not tolerate d/t diarrhea . Endorses polyuria, but still endorses urinary incontinence issues  . Current glucose  readings: o Fasting: reports highest was 170; reports usually in 150s o 2 hour post prandial: not checking . Cardiovascular risk reduction: o Current hypertensive regimen: carvedilol 6.25 mg BID, amlodipine 10 mg daily; BP at goal SBP >140 at last clinic visit w/ nephrology o Current hyperlipidemia regimen: atorvastatin 40 mg daily; last LDL at goal <70 o Current antiplatelet regimen: ASA 81 mg daily; . Osteoporosis: alendronate 70 mg weekly; notes she is taking Centrum Silver Womens MVI, has Vitamin D 1000 mcg and Calcium 300 mg, takes QAM, at least 45 minutes after alendronate on Sundays . Overactive bladder: previously saw Dr. Bernardo Heater, prescribed solifenacin for symptoms. It was unaffordable at that time. Wonders if she needs to f/u.   Pharmacist Clinical Goal(s):  Marland Kitchen Over the next 90 days, patient will work with PharmD and primary care provider to address optimized medication management  Interventions: . Comprehensive medication review performed, medication list updated in electronic medical record . Inter-disciplinary care team collaboration (see longitudinal plan of care) . Discussed goal A1c, goal fasting sugars. Could consider SGLT2 for renal benefit, pending next eGFR. May not provide significant glycemic benefit if eGFR <45 mL/min, but does have evidence for safety. Discussed changing to weekly GLP1. Would recommend Trulicity d/t ease of injection. Patient has PCP visit in 2 weeks, will determine next steps at that time and provide Trulicity teaching if this route is taken.  Marland Kitchen  Discussed appropriate calcium + vitamin D supplementation w/ osteoporosis therapy. Recommended patient take a separate calcium supplement ~500 mg QPM, as she does not have a regular source of dietary supplementation daily. She verbalized understanding and will start doing this.   Patient Self Care Activities:  . Patient will check blood glucose daily , document, and provide at future appointments . Patient will  take medications as prescribed . Patient will report any questions or concerns to provider   Please see past updates related to this goal by clicking on the "Past Updates" button in the selected goal         The patient verbalized understanding of instructions provided today and agreed to receive a mailed copy of patient instruction and/or educational materials.  Plan:  - Scheduled f/u face to face in 2 weeks  [name], PharmD, [place], [place] [phone]

## 2019-12-28 ENCOUNTER — Other Ambulatory Visit: Payer: Self-pay | Admitting: Family Medicine

## 2020-01-04 ENCOUNTER — Telehealth: Payer: Self-pay | Admitting: Family Medicine

## 2020-01-04 ENCOUNTER — Other Ambulatory Visit: Payer: Self-pay

## 2020-01-06 ENCOUNTER — Ambulatory Visit: Payer: Medicare HMO | Admitting: Family Medicine

## 2020-01-06 ENCOUNTER — Ambulatory Visit: Payer: Medicare HMO

## 2020-01-08 ENCOUNTER — Other Ambulatory Visit: Payer: Self-pay | Admitting: Family Medicine

## 2020-01-08 DIAGNOSIS — E118 Type 2 diabetes mellitus with unspecified complications: Secondary | ICD-10-CM

## 2020-01-11 ENCOUNTER — Other Ambulatory Visit: Payer: Self-pay | Admitting: Family Medicine

## 2020-01-18 DIAGNOSIS — E1122 Type 2 diabetes mellitus with diabetic chronic kidney disease: Secondary | ICD-10-CM | POA: Diagnosis not present

## 2020-01-18 DIAGNOSIS — R808 Other proteinuria: Secondary | ICD-10-CM | POA: Diagnosis not present

## 2020-01-18 DIAGNOSIS — Z1211 Encounter for screening for malignant neoplasm of colon: Secondary | ICD-10-CM | POA: Diagnosis not present

## 2020-01-18 DIAGNOSIS — I10 Essential (primary) hypertension: Secondary | ICD-10-CM | POA: Diagnosis not present

## 2020-01-18 DIAGNOSIS — N1832 Chronic kidney disease, stage 3b: Secondary | ICD-10-CM | POA: Diagnosis not present

## 2020-01-19 LAB — COLOGUARD: Cologuard: NEGATIVE

## 2020-01-23 LAB — COLOGUARD: Cologuard: NEGATIVE

## 2020-01-27 ENCOUNTER — Encounter: Payer: Self-pay | Admitting: Family Medicine

## 2020-01-27 ENCOUNTER — Other Ambulatory Visit: Payer: Self-pay

## 2020-01-27 ENCOUNTER — Ambulatory Visit (INDEPENDENT_AMBULATORY_CARE_PROVIDER_SITE_OTHER): Payer: Medicare HMO

## 2020-01-27 ENCOUNTER — Ambulatory Visit: Payer: Medicare HMO | Admitting: Pharmacist

## 2020-01-27 ENCOUNTER — Ambulatory Visit (INDEPENDENT_AMBULATORY_CARE_PROVIDER_SITE_OTHER): Payer: Medicare HMO | Admitting: Family Medicine

## 2020-01-27 VITALS — BP 120/70 | HR 65 | Temp 98.6°F | Ht 63.0 in | Wt 158.0 lb

## 2020-01-27 DIAGNOSIS — I10 Essential (primary) hypertension: Secondary | ICD-10-CM

## 2020-01-27 DIAGNOSIS — Z8673 Personal history of transient ischemic attack (TIA), and cerebral infarction without residual deficits: Secondary | ICD-10-CM

## 2020-01-27 DIAGNOSIS — M545 Low back pain, unspecified: Secondary | ICD-10-CM

## 2020-01-27 DIAGNOSIS — N3281 Overactive bladder: Secondary | ICD-10-CM | POA: Diagnosis not present

## 2020-01-27 DIAGNOSIS — E118 Type 2 diabetes mellitus with unspecified complications: Secondary | ICD-10-CM | POA: Diagnosis not present

## 2020-01-27 DIAGNOSIS — R5383 Other fatigue: Secondary | ICD-10-CM | POA: Diagnosis not present

## 2020-01-27 DIAGNOSIS — I739 Peripheral vascular disease, unspecified: Secondary | ICD-10-CM | POA: Diagnosis not present

## 2020-01-27 DIAGNOSIS — R69 Illness, unspecified: Secondary | ICD-10-CM | POA: Diagnosis not present

## 2020-01-27 DIAGNOSIS — E782 Mixed hyperlipidemia: Secondary | ICD-10-CM

## 2020-01-27 DIAGNOSIS — Z23 Encounter for immunization: Secondary | ICD-10-CM | POA: Diagnosis not present

## 2020-01-27 DIAGNOSIS — G8929 Other chronic pain: Secondary | ICD-10-CM | POA: Diagnosis not present

## 2020-01-27 DIAGNOSIS — F3341 Major depressive disorder, recurrent, in partial remission: Secondary | ICD-10-CM

## 2020-01-27 LAB — CBC WITH DIFFERENTIAL/PLATELET
Basophils Absolute: 0.1 10*3/uL (ref 0.0–0.1)
Basophils Relative: 0.8 % (ref 0.0–3.0)
Eosinophils Absolute: 0.2 10*3/uL (ref 0.0–0.7)
Eosinophils Relative: 1.8 % (ref 0.0–5.0)
HCT: 35.5 % — ABNORMAL LOW (ref 36.0–46.0)
Hemoglobin: 11.9 g/dL — ABNORMAL LOW (ref 12.0–15.0)
Lymphocytes Relative: 16.9 % (ref 12.0–46.0)
Lymphs Abs: 1.5 10*3/uL (ref 0.7–4.0)
MCHC: 33.5 g/dL (ref 30.0–36.0)
MCV: 93 fl (ref 78.0–100.0)
Monocytes Absolute: 0.6 10*3/uL (ref 0.1–1.0)
Monocytes Relative: 6.7 % (ref 3.0–12.0)
Neutro Abs: 6.3 10*3/uL (ref 1.4–7.7)
Neutrophils Relative %: 73.8 % (ref 43.0–77.0)
Platelets: 285 10*3/uL (ref 150.0–400.0)
RBC: 3.82 Mil/uL — ABNORMAL LOW (ref 3.87–5.11)
RDW: 12.9 % (ref 11.5–15.5)
WBC: 8.6 10*3/uL (ref 4.0–10.5)

## 2020-01-27 LAB — COMPREHENSIVE METABOLIC PANEL
ALT: 22 U/L (ref 0–35)
AST: 19 U/L (ref 0–37)
Albumin: 4.3 g/dL (ref 3.5–5.2)
Alkaline Phosphatase: 58 U/L (ref 39–117)
BUN: 40 mg/dL — ABNORMAL HIGH (ref 6–23)
CO2: 26 mEq/L (ref 19–32)
Calcium: 9.7 mg/dL (ref 8.4–10.5)
Chloride: 102 mEq/L (ref 96–112)
Creatinine, Ser: 1.42 mg/dL — ABNORMAL HIGH (ref 0.40–1.20)
GFR: 43.58 mL/min — ABNORMAL LOW (ref >60.00)
Glucose, Bld: 177 mg/dL — ABNORMAL HIGH (ref 70–99)
Potassium: 5 mEq/L (ref 3.5–5.1)
Sodium: 136 mEq/L (ref 135–145)
Total Bilirubin: 0.5 mg/dL (ref 0.2–1.2)
Total Protein: 7.8 g/dL (ref 6.0–8.3)

## 2020-01-27 LAB — LIPID PANEL
Cholesterol: 104 mg/dL (ref 0–200)
HDL: 35.4 mg/dL — ABNORMAL LOW (ref >39.00)
LDL Cholesterol: 39 mg/dL (ref 0–99)
NonHDL: 68.26
Total CHOL/HDL Ratio: 3
Triglycerides: 147 mg/dL (ref 0.0–149.0)
VLDL: 29.4 mg/dL (ref 0.0–40.0)

## 2020-01-27 LAB — TSH: TSH: 6.7 u[IU]/mL — ABNORMAL HIGH (ref 0.35–4.50)

## 2020-01-27 LAB — VITAMIN B12: Vitamin B-12: 676 pg/mL (ref 211–911)

## 2020-01-27 LAB — HEMOGLOBIN A1C: Hgb A1c MFr Bld: 7.4 % — ABNORMAL HIGH (ref 4.6–6.5)

## 2020-01-27 LAB — VITAMIN D 25 HYDROXY (VIT D DEFICIENCY, FRACTURES): VITD: 30.56 ng/mL (ref 30.00–100.00)

## 2020-01-27 NOTE — Assessment & Plan Note (Signed)
Check A1c.  Continue Tradjenta.  Consider Trulicity if A1c is uncontrolled.

## 2020-01-27 NOTE — Chronic Care Management (AMB) (Signed)
Chronic Care Management   Follow Up Note   01/27/2020 Name: Crystal Miller MRN: [id number] DOB: [birth year]  Referred by: [name], MD Reason for referral : Chronic Care Management (Medication Management)   Crystal Miller is a 74 y.o. year old adult who is a primary care patient of [name], [name], MD. The CCM team was consulted for assistance with chronic disease management and care coordination needs.    Met with patient face to face after provider visit.   Review of patient status, including review of consultants reports, relevant laboratory and other test results, and collaboration with appropriate care team members and the patient's provider was performed as part of comprehensive patient evaluation and provision of chronic care management services.    SDOH (Social Determinants of Health) assessments performed: Yes See Care Plan activities for detailed interventions related to SDOH)  SDOH Interventions     Most Recent Value  SDOH Interventions  Financial Strain Interventions Intervention Not Indicated       Outpatient Encounter Medications as of 01/27/2020  Medication Sig  . calcium carbonate (OS-CAL) 600 MG tablet Take 600 mg by mouth daily.  Marland Kitchen acetaminophen (TYLENOL) 500 MG tablet Take 1,000 mg by mouth every 6 (six) hours as needed (for pain.).  Marland Kitchen alendronate (FOSAMAX) 70 MG tablet TAKE 1 TABLET BY MOUTH ONCE A WEEK WITH  A  FULL  GLASS  OF  WATER  ON  AN  EMPTY  STOMACH  . amLODipine (NORVASC) 10 MG tablet Take 1 tablet by mouth once daily  . aspirin EC 81 MG EC tablet Take 1 tablet (81 mg total) by mouth daily.  Marland Kitchen atorvastatin (LIPITOR) 40 MG tablet TAKE 1 TABLET BY MOUTH ONCE DAILY AT  6PM  . blood glucose meter kit and supplies KIT Dispense based on patient and insurance preference. Use up to four times daily as directed. (FOR ICD-9 250.00, 250.01).  . carvedilol (COREG) 6.25 MG tablet TAKE 1 TABLET BY MOUTH TWICE DAILY WITH MEALS  . diclofenac Sodium (VOLTAREN) 1  % GEL Apply 2 g topically 4 (four) times daily.  . Multiple Vitamin (MULTIVITAMIN WITH MINERALS) TABS tablet Take 1 tablet by mouth daily. Centrum Silver . In the morning  . oxybutynin (DITROPAN-XL) 5 MG 24 hr tablet Take 5 mg by mouth daily.  [name] Glycol-Propyl Glycol (LUBRICANT EYE DROPS) 0.4-0.3 % SOLN Place 1 drop into both eyes 3 (three) times daily as needed (for dry eyes.).  Marland Kitchen sertraline (ZOLOFT) 50 MG tablet Take 0.5 tablets (25 mg total) by mouth daily.  . TRADJENTA 5 MG TABS tablet Take 1 tablet by mouth once daily  . [DISCONTINUED] Chelated Calcium 200 MG TABS Take by mouth.   No facility-administered encounter medications on file as of 01/27/2020.     Objective:   Goals Addressed              This Visit's Progress     Patient Stated   .  "I want to work on my diabetes" (pt-stated)        CARE PLAN ENTRY (see longtitudinal plan of care for additional care plan information)  Current Barriers:  . Social, financial, and community barriers:  o Denies cost concerns at this time. Has Medicare Extra Help.  . Most recent eGFR ~46 mL/min per nephroogy . Diabetes: uncontrolled; most recent A1c 7.1%   . Current antihyperglycemic regimen: Tradjenta 5 mg daily - plan to change to Trulicity pending K3K o Hx metformin, did not tolerate d/t diarrhea .  Endorses polyuria, but still endorses urinary incontinence issues  . Current glucose readings: o Fasting 140-160s . Cardiovascular risk reduction: o Current hypertensive regimen: carvedilol 6.25 mg BID, amlodipine 10 mg daily; SBP at goal <130 today o Current hyperlipidemia regimen: atorvastatin 40 mg daily; last LDL at goal <70, rechecking today o Current antiplatelet regimen: ASA 81 mg daily; . Osteoporosis: alendronate 70 mg weekly on Sundays; Centrum Silver Womens MVI (has Vitamin D 1000 mcg and Calcium 300 mg) daily+ calcium 600 mg daily; Vit D being checked today . Overactive bladder: previously saw Dr. Bernardo Heater, prescribed  solifenacin for symptoms. It was unaffordable at that time. Requested script from nephrology, oxybutynin XL 5 mg daily started. Does endorse some dry mouth. . Depression: sertraline 25 mg daily   Pharmacist Clinical Goal(s):  Marland Kitchen Over the next 90 days, patient will work with PharmD and primary care provider to address optimized medication management  Interventions: . Comprehensive medication review performed, medication list updated in electronic medical record . Inter-disciplinary care team collaboration (see longitudinal plan of care) . Counseled on Trulicity injection technique. Pending A1c, will d/c Tradjenta and start Trulicity. Patient demonstrated understanding through teach back method.  . Reviewed goal BP, praised for BP medication adherence.  . Reviewed goal LDL, praised for cholesterol medication adherence . Reviewed anticholinergic side effects.   Patient Self Care Activities:  . Patient will check blood glucose daily , document, and provide at future appointments . Patient will take medications as prescribed . Patient will report any questions or concerns to provider   Please see past updates related to this goal by clicking on the "Past Updates" button in the selected goal          Plan:  - Scheduled f/u call in ~4-6 weeks  [name], PharmD, [place], [place] Pharmacist [place] [phone]

## 2020-01-27 NOTE — Patient Instructions (Signed)
Visit Information  Goals Addressed              This Visit's Progress     Patient Stated   .  "I want to work on my diabetes" (pt-stated)        CARE PLAN ENTRY (see longtitudinal plan of care for additional care plan information)  Current Barriers:  . Social, financial, and community barriers:  o Denies cost concerns at this time. Has Medicare Extra Help.  . Most recent eGFR ~46 mL/min per nephroogy . Diabetes: uncontrolled; most recent A1c 7.1%   . Current antihyperglycemic regimen: Tradjenta 5 mg daily - plan to change to Trulicity pending E8B o Hx metformin, did not tolerate d/t diarrhea . Endorses polyuria, but still endorses urinary incontinence issues  . Current glucose readings: o Fasting 140-160s . Cardiovascular risk reduction: o Current hypertensive regimen: carvedilol 6.25 mg BID, amlodipine 10 mg daily; SBP at goal <130 today o Current hyperlipidemia regimen: atorvastatin 40 mg daily; last LDL at goal <70, rechecking today o Current antiplatelet regimen: ASA 81 mg daily; . Osteoporosis: alendronate 70 mg weekly on Sundays; Centrum Silver Womens MVI (has Vitamin D 1000 mcg and Calcium 300 mg) daily+ calcium 600 mg daily; Vit D being checked today . Overactive bladder: previously saw Dr. Bernardo Heater, prescribed solifenacin for symptoms. It was unaffordable at that time. Requested script from nephrology, oxybutynin XL 5 mg daily started. Does endorse some dry mouth. . Depression: sertraline 25 mg daily   Pharmacist Clinical Goal(s):  Marland Kitchen Over the next 90 days, patient will work with PharmD and primary care provider to address optimized medication management  Interventions: . Comprehensive medication review performed, medication list updated in electronic medical record . Inter-disciplinary care team collaboration (see longitudinal plan of care) . Counseled on Trulicity injection technique. Pending A1c, will d/c Tradjenta and start Trulicity. Patient demonstrated  understanding through teach back method.  . Reviewed goal BP, praised for BP medication adherence.  . Reviewed goal LDL, praised for cholesterol medication adherence . Reviewed anticholinergic side effects.   Patient Self Care Activities:  . Patient will check blood glucose daily , document, and provide at future appointments . Patient will take medications as prescribed . Patient will report any questions or concerns to provider   Please see past updates related to this goal by clicking on the "Past Updates" button in the selected goal         The patient verbalized understanding of instructions provided today and declined a print copy of patient instruction materials.   Plan:  - Scheduled f/u call in ~4-6 weeks  [name], PharmD, [place], [place] Pharmacist [place] [phone]

## 2020-01-27 NOTE — Progress Notes (Signed)
Crystal Rumps, MD Phone: [phone]  Crystal Miller is a 74 y.o. adult who presents today for f/u.  DIABETES Disease Monitoring: Blood Sugar ranges-150-165 Polyuria/phagia/dipsia- some polydipsia      Optho- UTD Medications: Compliance- taking tradjenta Hypoglycemic symptoms- no  Low back pain: Patient notes this has been going on for 4 to 5 months.  Only occurs when she is bending over.  No injury.  No radiation.  No numbness or weakness.  No incontinence.  She does walk for exercise and does some exercises at home.  Decreased energy level: This is been an ongoing issue.  Some days she feels like she has no energy.  She does not have the desire to get up out of bed though when she gets up and gets going she feels better.  Some depression though notes she does not let this linger.  No anxiety.  No chest pain or shortness of breath.  Overactive bladder: Patient just started on oxybutynin through nephrology.  She has not noticed a difference though has only been on this for 3 days.  Has noticed some dry mouth with this.  Social History   Tobacco Use  Smoking Status Former Smoker  . Packs/day: 1.00  . Years: 20.00  . Pack years: 20.00  . Types: Cigarettes  . Quit date: 2012  . Years since quitting: 9.6  Smokeless Tobacco Never Used     ROS see history of present illness  Objective  Physical Exam Vitals:   01/27/20 1042 01/27/20 1050  BP: 140/80 120/70  Pulse: 65   Temp: 98.6 F (37 C)   SpO2: 98%     BP Readings from Last 3 Encounters:  01/27/20 120/70  08/27/19 115/74  08/19/19 (!) 144/58   Wt Readings from Last 3 Encounters:  01/27/20 158 lb (71.7 kg)  11/24/19 152 lb (68.9 kg)  08/27/19 152 lb (68.9 kg)    Physical Exam Constitutional:      General: She is not in acute distress.    Appearance: She is not diaphoretic.  Cardiovascular:     Rate and Rhythm: Normal rate and regular rhythm.     Heart sounds: Normal heart sounds.  Pulmonary:     Effort:  Pulmonary effort is normal.     Breath sounds: Normal breath sounds.  Musculoskeletal:     Right lower leg: No edema.     Left lower leg: No edema.     Comments: No midline spine tenderness, no midline spine step-off, no muscular back tenderness  Skin:    General: Skin is warm and dry.  Neurological:     Mental Status: She is alert.     Comments: 5/5 strength in bilateral biceps, triceps, grip, quads, hamstrings, plantar and dorsiflexion, sensation to light touch intact in bilateral UE and LE      Assessment/Plan: Please see individual problem list.  Essential hypertension Adequately controlled.  Continue current regimen.  Check CMP.  PAD (peripheral artery disease) (HCC) Continue risk factor modification.  Check labs today.  DM (diabetes mellitus), type 2 with complications (HCC) Check A1c.  Continue Tradjenta.  Consider Trulicity if Y6R is uncontrolled.  Overactive bladder Patient will monitor her symptoms on the oxybutynin.  Discussed possible side effects as outlined in AVS.  If she notices worsening dry mouth or other side effects she will let us or her nephrologist know.  Depression Mild symptoms.  Continue Zoloft.  Decreased energy Could be related to the depression.  We will check lab work to evaluate  for other causes.  Mixed hyperlipidemia Check lipid panel.  Continue Lipitor.  Low back pain Given duration of symptoms we will get an x-ray today.  Discussed physical therapy though she declines this.  She will monitor.   Orders Placed This Encounter  Procedures  . DG Lumbar Spine Complete    Standing Status:   Future    Number of Occurrences:   1    Standing Expiration Date:   01/26/2021    Order Specific Question:   Reason for Exam (SYMPTOM  OR DIAGNOSIS REQUIRED)    Answer:   chronic low back pain, left low back, hurts when she bends over, 4-5 months duration    Order Specific Question:   Preferred imaging location?    Answer:   [facility] Specific Question:   Radiology Contrast Protocol - do NOT remove file path    Answer:   [email or web address]  . Flu Vaccine QUAD High Dose(Fluad)  . HgB A1c  . Comp Met (CMET)  . Lipid panel  . CBC w/Diff  . TSH  . B12  . Vitamin D (25 hydroxy)    No orders of the defined types were placed in this encounter.   This visit occurred during the SARS-CoV-2 public health emergency.  Safety protocols were in place, including screening questions prior to the visit, additional usage of staff PPE, and extensive cleaning of exam room while observing appropriate contact time as indicated for disinfecting solutions.    Crystal Rumps, MD [place]

## 2020-01-27 NOTE — Assessment & Plan Note (Addendum)
Patient will monitor her symptoms on the oxybutynin.  Discussed possible side effects as outlined in AVS.  If she notices worsening dry mouth or other side effects she will let us or her nephrologist know.

## 2020-01-27 NOTE — Assessment & Plan Note (Signed)
Mild symptoms.  Continue Zoloft.

## 2020-01-27 NOTE — Assessment & Plan Note (Signed)
Check lipid panel.  Continue Lipitor. 

## 2020-01-27 NOTE — Assessment & Plan Note (Signed)
Could be related to the depression.  We will check lab work to evaluate for other causes.

## 2020-01-27 NOTE — Patient Instructions (Signed)
Nice to see you. We will check lab work today and contact you with the results. We will contact you with your x-ray result. If you notice any additional side effects from the oxybutynin please let us or your nephrologist know.  Oxybutynin extended-release tablets What is this medicine? OXYBUTYNIN (ox i BYOO ti nin) is used to treat overactive bladder. This medicine reduces the amount of bathroom visits. It may also help to control wetting accidents. This medicine may be used for other purposes; ask your health care provider or pharmacist if you have questions. COMMON BRAND NAME(S): Ditropan XL What should I tell my health care provider before I take this medicine? They need to know if you have any of these conditions:  autonomic neuropathy  dementia  difficulty passing urine  glaucoma  intestinal obstruction  kidney disease  liver disease  myasthenia gravis  Parkinson's disease  an unusual or allergic reaction to oxybutynin, other medicines, foods, dyes, or preservatives  pregnant or trying to get pregnant  breast-feeding How should I use this medicine? Take this medicine by mouth with a glass of water. Swallow whole, do not crush, cut, or chew. Follow the directions on the prescription label. You can take this medicine with or without food. Take your doses at regular intervals. Do not take your medicine more often than directed. Talk to your pediatrician regarding the use of this medicine in children. Special care may be needed. While this drug may be prescribed for children as young as 6 years for selected conditions, precautions do apply. Overdosage: If you think you have taken too much of this medicine contact a poison control center or emergency room at once. NOTE: This medicine is only for you. Do not share this medicine with others. What if I miss a dose? If you miss a dose, take it as soon as you can. If it is almost time for your next dose, take only that dose. Do  not take double or extra doses. What may interact with this medicine?  antihistamines for allergy, cough and cold  atropine  certain medicines for bladder problems like oxybutynin, tolterodine  certain medicines for Parkinson's disease like benztropine, trihexyphenidyl  certain medicines for stomach problems like dicyclomine, hyoscyamine  certain medicines for travel sickness like scopolamine  clarithromycin  erythromycin  ipratropium  medicines for fungal infections, like fluconazole, itraconazole, ketoconazole or voriconazole This list may not describe all possible interactions. Give your health care provider a list of all the medicines, herbs, non-prescription drugs, or dietary supplements you use. Also tell them if you smoke, drink alcohol, or use illegal drugs. Some items may interact with your medicine. What should I watch for while using this medicine? It may take a few weeks to notice the full benefit from this medicine. You may need to limit your intake tea, coffee, caffeinated sodas, and alcohol. These drinks may make your symptoms worse. You may get drowsy or dizzy. Do not drive, use machinery, or do anything that needs mental alertness until you know how this medicine affects you. Do not stand or sit up quickly, especially if you are an older patient. This reduces the risk of dizzy or fainting spells. Alcohol may interfere with the effect of this medicine. Avoid alcoholic drinks. Your mouth may get dry. Chewing sugarless gum or sucking hard candy, and drinking plenty of water may help. Contact your doctor if the problem does not go away or is severe. This medicine may cause dry eyes and blurred vision. If you  wear contact lenses, you may feel some discomfort. Lubricating drops may help. See your eyecare professional if the problem does not go away or is severe. You may notice the shells of the tablets in your stool from time to time. This is normal. Avoid extreme heat. This  medicine can cause you to sweat less than normal. Your body temperature could increase to dangerous levels, which may lead to heat stroke. What side effects may I notice from receiving this medicine? Side effects that you should report to your doctor or health care professional as soon as possible:  allergic reactions like skin rash, itching or hives, swelling of the face, lips, or tongue  agitation  breathing problems  confusion  fever  flushing (reddening of the skin)  hallucinations  memory loss  pain or difficulty passing urine  palpitations  unusually weak or tired Side effects that usually do not require medical attention (report to your doctor or health care professional if they continue or are bothersome):  constipation  headache  sexual difficulties (impotence) This list may not describe all possible side effects. Call your doctor for medical advice about side effects. You may report side effects to FDA at 1-800-FDA-1088. Where should I keep my medicine? Keep out of the reach of children. Store at room temperature between 15 and 30 degrees C (59 and 86 degrees F). Protect from moisture and humidity. Throw away any unused medicine after the expiration date. NOTE: This sheet is a summary. It may not cover all possible information. If you have questions about this medicine, talk to your doctor, pharmacist, or health care provider.  2020 Elsevier/Gold Standard (2013-07-23 10:57:06)

## 2020-01-27 NOTE — Assessment & Plan Note (Signed)
Adequately controlled.  Continue current regimen.  Check CMP. 

## 2020-01-27 NOTE — Assessment & Plan Note (Signed)
Given duration of symptoms we will get an x-ray today.  Discussed physical therapy though she declines this.  She will monitor.

## 2020-01-27 NOTE — Assessment & Plan Note (Signed)
Continue risk factor modification.  Check labs today.

## 2020-01-28 ENCOUNTER — Ambulatory Visit: Payer: Medicare HMO | Admitting: Pharmacist

## 2020-01-28 DIAGNOSIS — E118 Type 2 diabetes mellitus with unspecified complications: Secondary | ICD-10-CM

## 2020-01-28 MED ORDER — TRULICITY 0.75 MG/0.5ML ~~LOC~~ SOAJ: 0.7500 mg | SUBCUTANEOUS | 2 refills | Status: DC

## 2020-01-28 NOTE — Chronic Care Management (AMB) (Signed)
Chronic Care Management   Follow Up Note   01/28/2020 Name: Crystal Miller MRN: [id number] DOB: [birth year]  Referred by: [name], MD Reason for referral : Chronic Care Management (Medication Management)   Crystal Miller is a 74 y.o. year old adult who is a primary care patient of [name], [name], MD. The CCM team was consulted for assistance with chronic disease management and care coordination needs.    Follow up on medication management from yesterday now that labs resulted.  Review of patient status, including review of consultants reports, relevant laboratory and other test results, and collaboration with appropriate care team members and the patient's provider was performed as part of comprehensive patient evaluation and provision of chronic care management services.    SDOH (Social Determinants of Health) assessments performed: No See Care Plan activities for detailed interventions related to [facility])     Outpatient Encounter Medications as of 01/28/2020  Medication Sig  . acetaminophen (TYLENOL) 500 MG tablet Take 1,000 mg by mouth every 6 (six) hours as needed (for pain.).  Marland Kitchen alendronate (FOSAMAX) 70 MG tablet TAKE 1 TABLET BY MOUTH ONCE A WEEK WITH  A  FULL  GLASS  OF  WATER  ON  AN  EMPTY  STOMACH  . amLODipine (NORVASC) 10 MG tablet Take 1 tablet by mouth once daily  . aspirin EC 81 MG EC tablet Take 1 tablet (81 mg total) by mouth daily.  Marland Kitchen atorvastatin (LIPITOR) 40 MG tablet TAKE 1 TABLET BY MOUTH ONCE DAILY AT  6PM  . blood glucose meter kit and supplies KIT Dispense based on patient and insurance preference. Use up to four times daily as directed. (FOR ICD-9 250.00, 250.01).  . calcium carbonate (OS-CAL) 600 MG tablet Take 600 mg by mouth daily.  . carvedilol (COREG) 6.25 MG tablet TAKE 1 TABLET BY MOUTH TWICE DAILY WITH MEALS  . diclofenac Sodium (VOLTAREN) 1 % GEL Apply 2 g topically 4 (four) times daily.  . Dulaglutide (TRULICITY) 8.84 ZY/6.0YT SOPN Inject 0.75  mg into the skin once a week.  . Multiple Vitamin (MULTIVITAMIN WITH MINERALS) TABS tablet Take 1 tablet by mouth daily. Centrum Silver . In the morning  . oxybutynin (DITROPAN-XL) 5 MG 24 hr tablet Take 5 mg by mouth daily.  [name] Glycol-Propyl Glycol (LUBRICANT EYE DROPS) 0.4-0.3 % SOLN Place 1 drop into both eyes 3 (three) times daily as needed (for dry eyes.).  Marland Kitchen sertraline (ZOLOFT) 50 MG tablet Take 0.5 tablets (25 mg total) by mouth daily.  . [DISCONTINUED] TRADJENTA 5 MG TABS tablet Take 1 tablet by mouth once daily   No facility-administered encounter medications on file as of 01/28/2020.     Objective:   Goals Addressed              This Visit's Progress     Patient Stated   .  "I want to work on my diabetes" (pt-stated)        CARE PLAN ENTRY (see longtitudinal plan of care for additional care plan information)  Current Barriers:  . Social, financial, and community barriers:  o Denies cost concerns at this time. Has Medicare Extra Help.  . Most recent eGFR ~43 mL/min . Diabetes: uncontrolled; most recent A1c 7.4% as of yesterday   . Current antihyperglycemic regimen: Tradjenta 5 mg daily o Hx metformin, did not tolerate d/t diarrhea . Endorses polyuria, but still endorses urinary incontinence issues  . Current glucose readings: o Fasting 140-160s reported yesterday . Cardiovascular risk  reduction: o Current hypertensive regimen: carvedilol 6.25 mg BID, amlodipine 10 mg daily; SBP at goal <130 in office yesterday o Current hyperlipidemia regimen: atorvastatin 40 mg daily; LDL very well controlled <70 o Current antiplatelet regimen: ASA 81 mg daily; . Osteoporosis: alendronate 70 mg weekly on Sundays; Centrum Silver Womens MVI (has Vitamin D 1000 mcg and Calcium 300 mg) daily+ calcium 600 mg daily; Vit D WNL . Overactive bladder: oxybutynin XL 10 mg daily . Depression: sertraline 25 mg daily  . Low energy: TSH high at 6.7. B12 WNL, Vit D WNL.   Pharmacist  Clinical Goal(s):  Marland Kitchen Over the next 90 days, patient will work with PharmD and primary care provider to address optimized medication management  Interventions: . Comprehensive medication review performed, medication list updated in electronic medical record . Inter-disciplinary care team collaboration (see longitudinal plan of care) . As discussed yesterday, start Trulicity 2.25 mg weekly. D/c Tradjenta. Patient notified.  . Will defer management of high TSH to PCP.   Patient Self Care Activities:  . Patient will check blood glucose daily , document, and provide at future appointments . Patient will take medications as prescribed . Patient will report any questions or concerns to provider   Please see past updates related to this goal by clicking on the "Past Updates" button in the selected goal          Plan:  - Will f/u in ~ 6 weeks as previously scheduled  [name], PharmD, [place], [place] Pharmacist [place] [phone]

## 2020-01-28 NOTE — Patient Instructions (Signed)
Visit Information  Goals Addressed              This Visit's Progress     Patient Stated   .  "I want to work on my diabetes" (pt-stated)        CARE PLAN ENTRY (see longtitudinal plan of care for additional care plan information)  Current Barriers:  . Social, financial, and community barriers:  o Denies cost concerns at this time. Has Medicare Extra Help.  . Most recent eGFR ~43 mL/min . Diabetes: uncontrolled; most recent A1c 7.4% as of yesterday   . Current antihyperglycemic regimen: Tradjenta 5 mg daily o Hx metformin, did not tolerate d/t diarrhea . Endorses polyuria, but still endorses urinary incontinence issues  . Current glucose readings: o Fasting 140-160s reported yesterday . Cardiovascular risk reduction: o Current hypertensive regimen: carvedilol 6.25 mg BID, amlodipine 10 mg daily; SBP at goal <130 in office yesterday o Current hyperlipidemia regimen: atorvastatin 40 mg daily; LDL very well controlled <70 o Current antiplatelet regimen: ASA 81 mg daily; . Osteoporosis: alendronate 70 mg weekly on Sundays; Centrum Silver Womens MVI (has Vitamin D 1000 mcg and Calcium 300 mg) daily+ calcium 600 mg daily; Vit D WNL . Overactive bladder: oxybutynin XL 10 mg daily . Depression: sertraline 25 mg daily  . Low energy: TSH high at 6.7. B12 WNL, Vit D WNL.   Pharmacist Clinical Goal(s):  Marland Kitchen Over the next 90 days, patient will work with PharmD and primary care provider to address optimized medication management  Interventions: . Comprehensive medication review performed, medication list updated in electronic medical record . Inter-disciplinary care team collaboration (see longitudinal plan of care) . As discussed yesterday, start Trulicity 1.28 mg weekly. D/c Tradjenta. Patient notified.  . Will defer management of high TSH to PCP.   Patient Self Care Activities:  . Patient will check blood glucose daily , document, and provide at future appointments . Patient will take  medications as prescribed . Patient will report any questions or concerns to provider   Please see past updates related to this goal by clicking on the "Past Updates" button in the selected goal         The patient verbalized understanding of instructions provided today and declined a print copy of patient instruction materials.   Plan:  - Will f/u in ~ 6 weeks as previously scheduled  [name], PharmD, [place], [place] Pharmacist [place] [phone]

## 2020-02-03 DIAGNOSIS — H40003 Preglaucoma, unspecified, bilateral: Secondary | ICD-10-CM | POA: Diagnosis not present

## 2020-02-03 DIAGNOSIS — E119 Type 2 diabetes mellitus without complications: Secondary | ICD-10-CM | POA: Diagnosis not present

## 2020-02-03 LAB — HM DIABETES EYE EXAM

## 2020-02-04 ENCOUNTER — Other Ambulatory Visit: Payer: Self-pay | Admitting: Family Medicine

## 2020-02-04 DIAGNOSIS — R7989 Other specified abnormal findings of blood chemistry: Secondary | ICD-10-CM

## 2020-02-12 ENCOUNTER — Other Ambulatory Visit: Payer: Self-pay

## 2020-02-12 ENCOUNTER — Other Ambulatory Visit (INDEPENDENT_AMBULATORY_CARE_PROVIDER_SITE_OTHER): Payer: Medicare HMO

## 2020-02-12 DIAGNOSIS — R7989 Other specified abnormal findings of blood chemistry: Secondary | ICD-10-CM

## 2020-02-12 LAB — T4, FREE: Free T4: 0.86 ng/dL (ref 0.60–1.60)

## 2020-02-13 ENCOUNTER — Other Ambulatory Visit: Payer: Self-pay | Admitting: Family Medicine

## 2020-02-13 DIAGNOSIS — R7989 Other specified abnormal findings of blood chemistry: Secondary | ICD-10-CM

## 2020-02-23 ENCOUNTER — Other Ambulatory Visit: Payer: Self-pay | Admitting: Family Medicine

## 2020-03-10 ENCOUNTER — Ambulatory Visit: Payer: Medicare HMO | Admitting: Pharmacist

## 2020-03-10 DIAGNOSIS — N1832 Chronic kidney disease, stage 3b: Secondary | ICD-10-CM

## 2020-03-10 DIAGNOSIS — E118 Type 2 diabetes mellitus with unspecified complications: Secondary | ICD-10-CM

## 2020-03-10 DIAGNOSIS — E11319 Type 2 diabetes mellitus with unspecified diabetic retinopathy without macular edema: Secondary | ICD-10-CM

## 2020-03-10 DIAGNOSIS — I739 Peripheral vascular disease, unspecified: Secondary | ICD-10-CM

## 2020-03-10 DIAGNOSIS — I10 Essential (primary) hypertension: Secondary | ICD-10-CM

## 2020-03-10 DIAGNOSIS — E782 Mixed hyperlipidemia: Secondary | ICD-10-CM

## 2020-03-10 NOTE — Patient Instructions (Signed)
Visit Information  Goals Addressed              This Visit's Progress     Patient Stated   .  "I want to work on my diabetes" (pt-stated)        CARE PLAN ENTRY (see longtitudinal plan of care for additional care plan information)  Current Barriers:  . Social, financial, and community barriers:  o Denies cost concerns at this time. Has Medicare Extra Help.  o Reads bible, watches, talks on the phone to friends. Feels like she has good support and remains active.  . Most recent eGFR ~43 mL/min . Diabetes: uncontrolled; most recent J8I 3.2%; complicated by HTN, CKD   . Current antihyperglycemic regimen: Trulicity 5.49 mg weekly- has been on for 4 weeks. Denies GI upset, nausea, diarrhea o Hx metformin, did not tolerate d/t diarrhea . Current glucose readings: o Fasting 120-130s; not checking 2 hour post prandial . Cardiovascular risk reduction: o Current hypertensive regimen: carvedilol 6.25 mg BID, amlodipine 10 mg daily; SBP at goal <130 at last office visit o Current hyperlipidemia regimen: atorvastatin 40 mg daily; LDL very well controlled <70 o Current antiplatelet regimen: ASA 81 mg daily; . Osteoporosis: alendronate 70 mg weekly on Sundays; Centrum Silver Womens MVI (has Vitamin D 1000 mcg and Calcium 300 mg) daily + calcium 600 mg daily; Vit D WNL, reports that she generally has a source of dairy a few days per week . Overactive bladder: oxybutynin XL 5 mg daily . Depression: sertraline 25 mg daily  . Low energy: TSH high at 6.7- planning on repeating in ~8 weeks; B12 WNL, Vit D WNL. Marland Kitchen Notes that she completed Cologuard screening a few months ago, but continues to receive calls from [place] reminding her to complete it   Pharmacist Clinical Goal(s):  Marland Kitchen Over the next 90 days, patient will work with PharmD and primary care provider to address optimized medication management  Interventions: . Comprehensive medication review performed, medication list updated in electronic  medical record . Inter-disciplinary care team collaboration (see longitudinal plan of care) . Congratulated patient on improvement in glucose reading. Continue Trulicity 8.26 mg weekly. . Encouraged continued focus on daily activity.  . Counseled on goal calcium and vitamin D supplementation. Encouraged at least 1 serving of dairy daily . Will collaborate w/ CMA to follow up on cologuard   Patient Self Care Activities:  . Patient will check blood glucose periodically, document, and provide at future appointments . Patient will continue to focus on 150 minutes of exercise weekly . Patient will eat 1 high calcium serving daily . Patient will take medications as prescribed . Patient will report any questions or concerns to provider   Please see past updates related to this goal by clicking on the "Past Updates" button in the selected goal         The patient verbalized understanding of instructions provided today and declined a print copy of patient instruction materials.    Plan:  - Scheduled f/u call in ~ 8 weeks  [name], PharmD, [place], [place] Pharmacist [place] [phone]

## 2020-03-10 NOTE — Chronic Care Management (AMB) (Signed)
Chronic Care Management   Follow Up Note   03/10/2020 Name: Tahra Hitzeman MRN: [id number] DOB: August 07, 1945  Referred by: [name], MD Reason for referral : Chronic Care Management (Medication Management)   Dereon Williamsen is a 74 y.o. year old adult who is a primary care patient of [name], [name], MD. The CCM team was consulted for assistance with chronic disease management and care coordination needs.    Contacted patient for medication management follow up.   Review of patient status, including review of consultants reports, relevant laboratory and other test results, and collaboration with appropriate care team members and the patient's provider was performed as part of comprehensive patient evaluation and provision of chronic care management services.    SDOH (Social Determinants of Health) assessments performed: Yes See Care Plan activities for detailed interventions related to SDOH)  SDOH Interventions     Most Recent Value  SDOH Interventions  Physical Activity Interventions Intervention Not Indicated       Outpatient Encounter Medications as of 03/10/2020  Medication Sig  . acetaminophen (TYLENOL) 500 MG tablet Take 1,000 mg by mouth every 6 (six) hours as needed (for pain.).  Marland Kitchen alendronate (FOSAMAX) 70 MG tablet TAKE 1 TABLET BY MOUTH ONCE A WEEK WITH  A  FULL  GLASS  OF  WATER  ON  AN  EMPTY  STOMACH  . amLODipine (NORVASC) 10 MG tablet Take 1 tablet by mouth once daily  . aspirin EC 81 MG EC tablet Take 1 tablet (81 mg total) by mouth daily.  Marland Kitchen atorvastatin (LIPITOR) 40 MG tablet TAKE 1 TABLET BY MOUTH ONCE DAILY AT  6PM  . blood glucose meter kit and supplies KIT Dispense based on patient and insurance preference. Use up to four times daily as directed. (FOR ICD-9 250.00, 250.01).  . calcium carbonate (OS-CAL) 600 MG tablet Take 600 mg by mouth daily.  . carvedilol (COREG) 6.25 MG tablet TAKE 1 TABLET BY MOUTH TWICE DAILY WITH MEALS  . diclofenac Sodium  (VOLTAREN) 1 % GEL Apply 2 g topically 4 (four) times daily.  . Dulaglutide (TRULICITY) 6.60 YT/0.1SW SOPN Inject 0.75 mg into the skin once a week.  . Multiple Vitamin (MULTIVITAMIN WITH MINERALS) TABS tablet Take 1 tablet by mouth daily. Centrum Silver . In the morning  . oxybutynin (DITROPAN-XL) 5 MG 24 hr tablet Take 5 mg by mouth daily.  [name] Glycol-Propyl Glycol (LUBRICANT EYE DROPS) 0.4-0.3 % SOLN Place 1 drop into both eyes 3 (three) times daily as needed (for dry eyes.).  Marland Kitchen sertraline (ZOLOFT) 50 MG tablet Take 0.5 tablets (25 mg total) by mouth daily.   No facility-administered encounter medications on file as of 03/10/2020.     Objective:   Goals Addressed              This Visit's Progress     Patient Stated   .  "I want to work on my diabetes" (pt-stated)        CARE PLAN ENTRY (see longtitudinal plan of care for additional care plan information)  Current Barriers:  . Social, financial, and community barriers:  o Denies cost concerns at this time. Has Medicare Extra Help.  o Reads bible, watches, talks on the phone to friends. Feels like she has good support and remains active.  . Most recent eGFR ~43 mL/min . Diabetes: uncontrolled; most recent F0X 3.2%; complicated by HTN, CKD   . Current antihyperglycemic regimen: Trulicity 3.55 mg weekly- has been on for 4  weeks. Denies GI upset, nausea, diarrhea o Hx metformin, did not tolerate d/t diarrhea . Current glucose readings: o Fasting 120-130s; not checking 2 hour post prandial . Cardiovascular risk reduction: o Current hypertensive regimen: carvedilol 6.25 mg BID, amlodipine 10 mg daily; SBP at goal <130 at last office visit o Current hyperlipidemia regimen: atorvastatin 40 mg daily; LDL very well controlled <70 o Current antiplatelet regimen: ASA 81 mg daily; . Osteoporosis: alendronate 70 mg weekly on Sundays; Centrum Silver Womens MVI (has Vitamin D 1000 mcg and Calcium 300 mg) daily + calcium 600 mg  daily; Vit D WNL, reports that she generally has a source of dairy a few days per week . Overactive bladder: oxybutynin XL 5 mg daily . Depression: sertraline 25 mg daily  . Low energy: TSH high at 6.7- planning on repeating in ~8 weeks; B12 WNL, Vit D WNL. Marland Kitchen Notes that she completed Cologuard screening a few months ago, but continues to receive calls from [employer] reminding her to complete it   Pharmacist Clinical Goal(s):  Marland Kitchen Over the next 90 days, patient will work with PharmD and primary care provider to address optimized medication management  Interventions: . Comprehensive medication review performed, medication list updated in electronic medical record . Inter-disciplinary care team collaboration (see longitudinal plan of care) . Congratulated patient on improvement in glucose reading. Continue Trulicity 0.72 mg weekly. . Encouraged continued focus on daily activity.  . Counseled on goal calcium and vitamin D supplementation. Encouraged at least 1 serving of dairy daily . Will collaborate w/ CMA to follow up on cologuard   Patient Self Care Activities:  . Patient will check blood glucose periodically, document, and provide at future appointments . Patient will continue to focus on 150 minutes of exercise weekly . Patient will eat 1 high calcium serving daily . Patient will take medications as prescribed . Patient will report any questions or concerns to provider   Please see past updates related to this goal by clicking on the "Past Updates" button in the selected goal          Plan:  - Scheduled f/u call in ~ 8 weeks  [name], PharmD, [place], [place] Pharmacist [place] [phone]

## 2020-03-11 NOTE — Progress Notes (Signed)
I called and had exact sciences to fax the results and they have been abstracted and put in lab basket.  Crystal Miller,cma

## 2020-03-14 ENCOUNTER — Other Ambulatory Visit: Payer: Self-pay | Admitting: Family Medicine

## 2020-03-31 ENCOUNTER — Other Ambulatory Visit: Payer: Self-pay | Admitting: Family Medicine

## 2020-03-31 DIAGNOSIS — K08109 Complete loss of teeth, unspecified cause, unspecified class: Secondary | ICD-10-CM | POA: Diagnosis not present

## 2020-03-31 DIAGNOSIS — G8929 Other chronic pain: Secondary | ICD-10-CM | POA: Diagnosis not present

## 2020-03-31 DIAGNOSIS — R69 Illness, unspecified: Secondary | ICD-10-CM | POA: Diagnosis not present

## 2020-03-31 DIAGNOSIS — E1151 Type 2 diabetes mellitus with diabetic peripheral angiopathy without gangrene: Secondary | ICD-10-CM | POA: Diagnosis not present

## 2020-03-31 DIAGNOSIS — I10 Essential (primary) hypertension: Secondary | ICD-10-CM | POA: Diagnosis not present

## 2020-03-31 DIAGNOSIS — K219 Gastro-esophageal reflux disease without esophagitis: Secondary | ICD-10-CM | POA: Diagnosis not present

## 2020-03-31 DIAGNOSIS — H547 Unspecified visual loss: Secondary | ICD-10-CM | POA: Diagnosis not present

## 2020-03-31 DIAGNOSIS — I69354 Hemiplegia and hemiparesis following cerebral infarction affecting left non-dominant side: Secondary | ICD-10-CM | POA: Diagnosis not present

## 2020-03-31 DIAGNOSIS — E785 Hyperlipidemia, unspecified: Secondary | ICD-10-CM | POA: Diagnosis not present

## 2020-04-11 ENCOUNTER — Other Ambulatory Visit: Payer: Self-pay | Admitting: Family Medicine

## 2020-04-18 ENCOUNTER — Other Ambulatory Visit: Payer: Self-pay

## 2020-04-18 ENCOUNTER — Other Ambulatory Visit (INDEPENDENT_AMBULATORY_CARE_PROVIDER_SITE_OTHER): Payer: Medicare HMO

## 2020-04-18 DIAGNOSIS — R946 Abnormal results of thyroid function studies: Secondary | ICD-10-CM

## 2020-04-18 DIAGNOSIS — R7989 Other specified abnormal findings of blood chemistry: Secondary | ICD-10-CM

## 2020-04-18 LAB — TSH: TSH: 4.61 u[IU]/mL — ABNORMAL HIGH (ref 0.35–4.50)

## 2020-04-18 LAB — T4, FREE: Free T4: 0.83 ng/dL (ref 0.60–1.60)

## 2020-04-23 ENCOUNTER — Encounter: Payer: Self-pay | Admitting: Family Medicine

## 2020-04-23 DIAGNOSIS — E038 Other specified hypothyroidism: Secondary | ICD-10-CM | POA: Insufficient documentation

## 2020-04-25 ENCOUNTER — Other Ambulatory Visit: Payer: Self-pay | Admitting: Family Medicine

## 2020-04-25 ENCOUNTER — Telehealth: Payer: Self-pay | Admitting: Family Medicine

## 2020-04-25 DIAGNOSIS — E118 Type 2 diabetes mellitus with unspecified complications: Secondary | ICD-10-CM

## 2020-04-25 NOTE — Telephone Encounter (Signed)
Spoke with patient about her lab results.

## 2020-04-25 NOTE — Telephone Encounter (Signed)
Patient returned office phone for lab resutls, call her at  Home.

## 2020-05-10 ENCOUNTER — Telehealth: Payer: Medicare HMO

## 2020-05-10 ENCOUNTER — Telehealth: Payer: Self-pay | Admitting: Pharmacist

## 2020-05-10 NOTE — Telephone Encounter (Signed)
°  Chronic Care Management   Note  05/10/2020 Name: Aerith Canal MRN: [id number] DOB: 08-30-1945   Attempted to contact patient for scheduled appointment for medication management support. Left HIPAA compliant message for patient to return my call at their convenience.    Plan: - If I do not hear back from the patient by end of business today, will collaborate with Care Guide to outreach to schedule follow up with me   [name] Feliz Beam, PharmD, [place], CPP Clinical Pharmacist [employer] [facility] [employer] [phone]

## 2020-05-12 ENCOUNTER — Telehealth: Payer: Self-pay

## 2020-05-12 NOTE — Chronic Care Management (AMB) (Signed)
  Care Management   Note  05/12/2020 Name: Crystal Miller MRN: [id number] DOB: 08-11-1945  Crystal Miller is a 74 y.o. year old adult who is a primary care patient of Glori Luis, MD and is actively engaged with the care management team. I reached out to Consuello Masse by phone today to assist with re-scheduling a follow up visit with the Pharmacist  Follow up plan: Unsuccessful telephone outreach attempt made. A HIPAA compliant phone message was left for the patient providing contact information and requesting a return call.  The care management team will reach out to the patient again over the next 7 days.  If patient returns call to provider office, please advise to call Embedded Care Management Care Guide Penne Lash  at [phone]  Penne Lash, RMA Care Guide, Embedded Care Coordination [facility]  [place], [place] [address] Direct Dial: [phone] [email or web address] Website: [email or web address]

## 2020-05-16 ENCOUNTER — Other Ambulatory Visit: Payer: Self-pay | Admitting: Family Medicine

## 2020-05-24 NOTE — Chronic Care Management (AMB) (Signed)
  Care Management   Note  05/24/2020 Name: Crystal Miller MRN: [id number] DOB: June 25, 1945  Crystal Miller is a 75 y.o. year old adult who is a primary care patient of Glori Luis, MD and is actively engaged with the care management team. I reached out to Consuello Masse by phone today to assist with re-scheduling a follow up visit with the Pharmacist  Follow up plan: Unsuccessful telephone outreach attempt made. A HIPAA compliant phone message was left for the patient providing contact information and requesting a return call.  The care management team will reach out to the patient again over the next 7 days.  If patient returns call to provider office, please advise to call Embedded Care Management Care Guide Penne Lash  at [phone]  Penne Lash, RMA Care Guide, Embedded Care Coordination [facility]  [place], [place] [address] Direct Dial: [phone] [email or web address] Website: [email or web address]

## 2020-05-30 ENCOUNTER — Other Ambulatory Visit: Payer: Self-pay | Admitting: Family Medicine

## 2020-05-31 NOTE — Chronic Care Management (AMB) (Signed)
  Care Management   Note  05/31/2020 Name: Marcina Kinnison MRN: [id number] DOB: 1946/02/14  Izella Ybanez is a 75 y.o. year old adult who is a primary care patient of Glori Luis, MD and is actively engaged with the care management team. I reached out to Consuello Masse by phone today to assist with re-scheduling a follow up visit with the Pharmacist  Follow up plan: Unable to make contact on outreach attempts x . PCP Dr. Birdie Sons  notified via routed documentation in medical record.   Penne Lash, RMA Care Guide, Embedded Care Coordination [facility]  [place], [place] [address] Direct Dial: [phone] [email or web address] Website: [email or web address]

## 2020-05-31 NOTE — Telephone Encounter (Signed)
Third unsuccessful outreach  

## 2020-06-17 ENCOUNTER — Other Ambulatory Visit: Payer: Self-pay | Admitting: Family Medicine

## 2020-06-17 ENCOUNTER — Ambulatory Visit: Payer: Medicare HMO | Admitting: Pharmacist

## 2020-06-17 DIAGNOSIS — Z8673 Personal history of transient ischemic attack (TIA), and cerebral infarction without residual deficits: Secondary | ICD-10-CM

## 2020-06-17 DIAGNOSIS — E118 Type 2 diabetes mellitus with unspecified complications: Secondary | ICD-10-CM

## 2020-06-17 DIAGNOSIS — I10 Essential (primary) hypertension: Secondary | ICD-10-CM

## 2020-06-17 DIAGNOSIS — E782 Mixed hyperlipidemia: Secondary | ICD-10-CM

## 2020-06-17 DIAGNOSIS — I739 Peripheral vascular disease, unspecified: Secondary | ICD-10-CM

## 2020-06-17 NOTE — Patient Instructions (Signed)
Visit Information  Goals Addressed              This Visit's Progress     Patient Stated   .  Medication Monitoring (pt-stated)        Patient Goals/Self-Care Activities . Over the next 90 days, patient will:  - take medications as prescribed check glucose periodically, document, and provide at future appointments check blood pressure periodically, document, and provide at future appointments Focus on ways to remain connected to other people       The patient verbalized understanding of instructions, educational materials, and care plan provided today and declined offer to receive copy of patient instructions, educational materials, and care plan.   Plan: Telephone follow up appointment with care management team member scheduled for:  ~ 12 weeks  [name] Feliz Beam, PharmD, [place], CPP Clinical Pharmacist [employer] at [employer] [phone]

## 2020-06-17 NOTE — Chronic Care Management (AMB) (Signed)
**Note Crystal-Identified via Obfuscation** Chronic Care Management Pharmacy Note  06/17/2020 Name:  Crystal Miller MRN:  [id number] DOB:  August 31, 1945  Subjective: Crystal Miller is an 75 y.o. year old adult who is a primary patient of [name], [name], MD.  The CCM team was consulted for assistance with disease management and care coordination needs.    Engaged with patient by telephone for follow up visit in response to provider referral for pharmacy case management and/or care coordination services.   Consent to Services:  The patient was given information about Chronic Care Management services, agreed to services, and gave verbal consent prior to initiation of services.  Please see initial visit note for detailed documentation.   Objective:  Lab Results  Component Value Date   CREATININE 1.42 (H) 01/27/2020   CREATININE 1.47 (H) 09/11/2019   CREATININE 1.51 (H) 09/09/2019    Lab Results  Component Value Date   HGBA1C 7.4 (H) 01/27/2020       Component Value Date/Time   CHOL 104 01/27/2020 1055   TRIG 147.0 01/27/2020 1055   HDL 35.40 (L) 01/27/2020 1055   CHOLHDL 3 01/27/2020 1055   VLDL 29.4 01/27/2020 1055   LDLCALC 39 01/27/2020 1055   LDLDIRECT 45.0 02/19/2018 1014    BP Readings from Last 3 Encounters:  01/27/20 120/70  08/27/19 115/74  08/19/19 (!) 144/58    Assessment: Review of patient past medical history, allergies, medications, health status, including review of consultants reports, laboratory and other test data, was performed as part of comprehensive evaluation and provision of chronic care management services.   SDOH:  (Social Determinants of Health) assessments and interventions performed:  SDOH Interventions   Flowsheet Row Most Recent Value  SDOH Interventions   Depression Interventions/Treatment  PHQ2-9 Score <4 Follow-up Not Indicated, Currently on Treatment      CCM Care Plan  Allergies  Allergen Reactions  . Metformin And Related Diarrhea    Medications Reviewed Today     Reviewed by Crystal Miller, RPH-CPP (Pharmacist) on 06/17/20 at 1154  Med List Status: <None>  Medication Order Taking? Sig Documenting Provider Last Dose Status Informant  acetaminophen (TYLENOL) 500 MG tablet [id number]  Take 1,000 mg by mouth every 6 (six) hours as needed (for pain.). [provider]  Active Family Member           Med Note Mayo Ao Sep 07, 2019  1:21 PM)    alendronate (FOSAMAX) 70 MG tablet [id number] Yes TAKE 1 TABLET BY MOUTH ONCE A WEEK WITH  A  FULL  GLASS  OF  WATER  ON  AN  EMPTY  STOMACH [name], MD Taking Active   amLODipine (NORVASC) 10 MG tablet [id number] Yes Take 1 tablet by mouth once daily [name], MD Taking Active   aspirin EC 81 MG EC tablet [id number] Yes Take 1 tablet (81 mg total) by mouth daily. Vaughan Basta, MD Taking Active Family Member  atorvastatin (LIPITOR) 40 MG tablet [id number] Yes TAKE 1 TABLET BY MOUTH ONCE DAILY AT  Meredith Staggers, MD Taking Active   blood glucose meter kit and supplies KIT [id number] Yes Dispense based on patient and insurance preference. Use up to four times daily as directed. (FOR ICD-9 250.00, 250.01). Schaevitz, Randall An, MD Taking Active Family Member  calcium carbonate (OS-CAL) 600 MG tablet [id number] Yes Take 600 mg by mouth daily. [provider] Taking Active   carvedilol (COREG) 6.25 MG tablet [id number] Yes TAKE 1 TABLET  BY MOUTH TWICE DAILY WITH MEALS [name], MD Taking Active   diclofenac Sodium (VOLTAREN) 1 % GEL [id number] No Apply 2 g topically 4 (four) times daily.  Patient not taking: Reported on 06/17/2020   [provider] Not Taking Active   Multiple Vitamin (MULTIVITAMIN WITH MINERALS) TABS tablet [id number] Yes Take 1 tablet by mouth daily. Centrum Silver . In the morning [provider] Taking Active Family Member  oxybutynin (DITROPAN-XL) 5 MG 24 hr tablet [id number] Yes Take 5 mg by mouth daily.  [provider] Taking Active   Polyethyl Glycol-Propyl Glycol 0.4-0.3 % SOLN [id number] Yes Place 1 drop into both eyes 3 (three) times daily as needed (for dry eyes.). [provider] Taking Active Family Member           Med Note Mayo Ao Jun 01, 2019 11:19 AM)    sertraline (ZOLOFT) 50 MG tablet [id number] Yes Take 1/2 (one-half) tablet by mouth once daily [name], MD Taking Active   TRULICITY 6.73 AL/9.3XT Bonney Aid [id number] Yes INJECT ONE SYRINGEFUL INTO THE SKIN ONCE WEEKLY [name], MD Taking Active           Patient Active Problem List   Diagnosis Date Noted  . Subclinical hypothyroidism 04/23/2020  . Decreased energy 01/27/2020  . GERD (gastroesophageal reflux disease) 08/19/2019  . Diabetic retinopathy ([place]) 12/04/2018  . Anemia 10/03/2018  . Overactive bladder 06/30/2018  . Low back pain 02/19/2018  . Excessive gas 02/19/2018  . Depression 02/19/2018  . Right hip pain 10/30/2017  . Urine frequency 10/30/2017  . PAD (peripheral artery disease) ([place]) 10/10/2017  . Decreased pedal pulses 07/30/2017  . DM (diabetes mellitus), type 2 with complications ([place]) 29/92/4268  . Essential hypertension 11/01/2016  . Mixed hyperlipidemia 11/01/2016  . Memory change 11/01/2016  . History of stroke 10/01/2016    Conditions to be addressed/monitored: HTN, HLD, DMII, Depression and CKD  Care Plan : Medication Management  Updates made by Crystal Miller, RPH-CPP since 06/17/2020 12:00 AM    Problem: Diabetes, HTN, HLD, CKD     Long-Range Goal: Disease Progression Prevention   Start Date: 06/17/2020  This Visit's Progress: On track  Priority: High  Note:   Current Barriers:  . Unable to achieve control of diabetes   Pharmacist Clinical Goal(s):  Marland Kitchen Over the next 90 days, patient will achieve adherence to monitoring guidelines and medication adherence to achieve therapeutic efficacy through collaboration with PharmD and  provider.   Interventions: . 1:1 collaboration with [name], MD regarding development and update of comprehensive plan of care as evidenced by provider attestation and co-signature . Inter-disciplinary care team collaboration (see longitudinal plan of care) . Comprehensive medication review performed; medication list updated in electronic medical record  Health Maintenance: . Reports she received COVID booster. [employer], received on 03/22/20. Added to chart.   Diabetes: . Uncontrolled; current treatment: Trulicity 3.41 mg weekly o Previously on Tradjenta, d/c when Trulicity started o Hx metformin, did not tolerate d/t diarrhea . Current glucose readings: fasting and post prandial 110-130s . Denies nausea, vomiting. Notes some constipation, relieved with occasional PRN miralax  . Current meal patterns: whole grain waffle; fresh or frozen vegetables . Educated on goal A1c, goal fasting, and goal 2 hour post prandial glucose readings.  . Recommended to continue current regimen at this time  Hypertension: . Controlled; current treatment: carvedilol 6.25 mg BID, amlodipine 10 mg daily . Current home readings: this  morning was 139/72, but generally 120-130s/70-80s . Denies hypotensive symptoms . Educated on goal BP . Recommended to continue current regimen at this time . Encouraged continued focus on reducing sodium intake  Hyperlipidemia and secondary ASCVD prevention (hx CVA) . Controlled per last lipid panel; current treatment: atorvastatin 40 mg daily . Current antiplatelet regimen: aspirin 81 mg daily . Recommended to continue current regimen at this time  Depression/Anxiety: . Controlled per patient report, she notes she is unsure she needs sertraline; current treatment: sertraline 25 mg daily . Reports that she ran out for about a week while waiting for her daughter to fill her medication, and didn't feel any different. Notes "occasionally being down" due to the  pandemic, but overall feels her mood is well managed.   Marland Kitchen PHQ9: 3  . Reviewed things she does for pleasure: reading, watching TV, will call friends to talk. Does note that she gets sad being at home all the time, but she keeps busy with chores around the house. Sleeps ~ 8 pm - 3 am and will then go back to sleep. Naps most afternoons around 2 pm  . Recommended to continue sertraline at this time, but can discuss discontinuation with PCP at next visit if she doesn't feel she needs it  Osteoporosis: . Current treatment: alendronate 70 mg weekly   . Supplementation: Centrum Silver Womens MVI (has Vitamin D 1000 mcg and Calcium 300 mg) daily + calcium 600 mg daily . Confirmed appropriate administration technique . Continue current regimen at this time  Overactive Bladder: . Controlled per patient report; current regimen: oxybutynin XL 5 mg daily . Continue current regimen at this time   Patient Goals/Self-Care Activities . Over the next 90 days, patient will:  - take medications as prescribed check glucose periodically, document, and provide at future appointments check blood pressure periodically, document, and provide at future appointments Focus on ways to remain connected to other people  Follow Up Plan: Telephone follow up appointment with care management team member scheduled for: ~ 12 weeks     Medication Assistance: None required.  Patient affirms current coverage meets needs. Medicare Extra Help.  Follow Up:  Patient agrees to Care Plan and Follow-up.  Plan: Telephone follow up appointment with care management team member scheduled for:  ~ 12 weeks  [name], PharmD, [place], [place] Clinical Pharmacist [employer] at [employer] [phone]

## 2020-06-27 ENCOUNTER — Other Ambulatory Visit: Payer: Self-pay | Admitting: Family Medicine

## 2020-07-07 ENCOUNTER — Other Ambulatory Visit: Payer: Self-pay | Admitting: Family Medicine

## 2020-07-18 ENCOUNTER — Other Ambulatory Visit: Payer: Self-pay | Admitting: Family Medicine

## 2020-07-18 DIAGNOSIS — N1832 Chronic kidney disease, stage 3b: Secondary | ICD-10-CM | POA: Diagnosis not present

## 2020-07-18 DIAGNOSIS — E118 Type 2 diabetes mellitus with unspecified complications: Secondary | ICD-10-CM

## 2020-07-18 DIAGNOSIS — I10 Essential (primary) hypertension: Secondary | ICD-10-CM | POA: Diagnosis not present

## 2020-07-18 DIAGNOSIS — E1122 Type 2 diabetes mellitus with diabetic chronic kidney disease: Secondary | ICD-10-CM | POA: Diagnosis not present

## 2020-07-18 DIAGNOSIS — R808 Other proteinuria: Secondary | ICD-10-CM | POA: Diagnosis not present

## 2020-07-19 DIAGNOSIS — N1832 Chronic kidney disease, stage 3b: Secondary | ICD-10-CM | POA: Diagnosis not present

## 2020-07-21 DIAGNOSIS — R8281 Pyuria: Secondary | ICD-10-CM | POA: Diagnosis not present

## 2020-07-27 ENCOUNTER — Ambulatory Visit: Payer: Medicare HMO | Admitting: Family Medicine

## 2020-08-25 ENCOUNTER — Encounter (INDEPENDENT_AMBULATORY_CARE_PROVIDER_SITE_OTHER): Payer: Medicare HMO

## 2020-08-25 ENCOUNTER — Other Ambulatory Visit: Payer: Self-pay

## 2020-08-25 ENCOUNTER — Ambulatory Visit (INDEPENDENT_AMBULATORY_CARE_PROVIDER_SITE_OTHER): Payer: Medicare HMO | Admitting: Vascular Surgery

## 2020-08-29 ENCOUNTER — Ambulatory Visit (INDEPENDENT_AMBULATORY_CARE_PROVIDER_SITE_OTHER): Payer: Medicare HMO | Admitting: Family Medicine

## 2020-08-29 ENCOUNTER — Encounter: Payer: Self-pay | Admitting: Family Medicine

## 2020-08-29 ENCOUNTER — Other Ambulatory Visit: Payer: Self-pay

## 2020-08-29 DIAGNOSIS — R1310 Dysphagia, unspecified: Secondary | ICD-10-CM | POA: Insufficient documentation

## 2020-08-29 DIAGNOSIS — I10 Essential (primary) hypertension: Secondary | ICD-10-CM

## 2020-08-29 DIAGNOSIS — E782 Mixed hyperlipidemia: Secondary | ICD-10-CM

## 2020-08-29 DIAGNOSIS — K219 Gastro-esophageal reflux disease without esophagitis: Secondary | ICD-10-CM | POA: Diagnosis not present

## 2020-08-29 DIAGNOSIS — R69 Illness, unspecified: Secondary | ICD-10-CM | POA: Diagnosis not present

## 2020-08-29 DIAGNOSIS — F3342 Major depressive disorder, recurrent, in full remission: Secondary | ICD-10-CM

## 2020-08-29 DIAGNOSIS — E118 Type 2 diabetes mellitus with unspecified complications: Secondary | ICD-10-CM | POA: Diagnosis not present

## 2020-08-29 LAB — POCT GLYCOSYLATED HEMOGLOBIN (HGB A1C): Hemoglobin A1C: 6.2 % — AB (ref 4.0–5.6)

## 2020-08-29 MED ORDER — ATORVASTATIN CALCIUM 40 MG PO TABS: ORAL_TABLET | ORAL | 0 refills | Status: DC

## 2020-08-29 MED ORDER — OMEPRAZOLE 20 MG PO CPDR: 20.0000 mg | DELAYED_RELEASE_CAPSULE | Freq: Every day | ORAL | 3 refills | Status: DC

## 2020-08-29 NOTE — Assessment & Plan Note (Signed)
Check A1c.  She will continue Trulicity 0.75 mg once weekly.

## 2020-08-29 NOTE — Assessment & Plan Note (Signed)
Has been having some reflux and what sounds to be dysphagia.  We will have her discontinue the Fosamax in case there is a lesion that could be worrisome.  We will start her on omeprazole once daily.  Will refer to GI.

## 2020-08-29 NOTE — Patient Instructions (Signed)
Nice to see you. Please stop the Fosamax in case this is contributing to your swallowing issues. Please stop the Zoloft. We will get you referred to GI to have an endoscopy. Need for shingles vaccine at the pharmacy. If you ever have food that gets stuck and does not completely go through or regurgitate please go to the emergency department.

## 2020-08-29 NOTE — Assessment & Plan Note (Signed)
Refer to GI for evaluation. 

## 2020-08-29 NOTE — Assessment & Plan Note (Signed)
She will continue Lipitor 40 mg once daily. °

## 2020-08-29 NOTE — Assessment & Plan Note (Signed)
Well-controlled today.  She will start to check more frequently at home.  She will continue amlodipine 10 mg once daily and carvedilol 6.25 mg twice daily.

## 2020-08-29 NOTE — Progress Notes (Signed)
Tommi Rumps, MD Phone: [phone]  Crystal Miller is a 75 y.o. adult who presents today for f/u.  HYPERTENSION Disease Monitoring: Blood pressure [id number] systolic though only checks 1-2x/month Chest pain- no      Dyspnea- no Medications: Compliance- taking amlodipine, coreg   Edema- no  DIABETES Disease Monitoring: Blood Sugar ranges-130-140 Polyuria/phagia/dipsia- no      Optho- UTD Medications: Compliance- taking trulicity Hypoglycemic symptoms- no  HYPERLIPIDEMIA Disease Monitoring: See symptoms for Hypertension Medications: Compliance- taking lipitor Right upper quadrant pain- no  Muscle aches- no  Dysphagia: Patient notes this is been going on about a month.  Occasionally feels like food sticks.  Sometimes she gets choked on water as well.  She does have some issues with reflux.  No blood in her stool.  No history of an EGD.  Depression: She denies symptoms.  She wonders if she can come off of Zoloft.   Social History   Tobacco Use  Smoking Status Former Smoker  . Packs/day: 1.00  . Years: 20.00  . Pack years: 20.00  . Types: Cigarettes  . Quit date: 2012  . Years since quitting: 10.2  Smokeless Tobacco Never Used    Current Outpatient Medications on File Prior to Visit  Medication Sig Dispense Refill  . acetaminophen (TYLENOL) 500 MG tablet Take 1,000 mg by mouth every 6 (six) hours as needed (for pain.).    Marland Kitchen amLODipine (NORVASC) 10 MG tablet Take 1 tablet by mouth once daily 90 tablet 0  . aspirin EC 81 MG EC tablet Take 1 tablet (81 mg total) by mouth daily. 30 tablet 2  . blood glucose meter kit and supplies KIT Dispense based on patient and insurance preference. Use up to four times daily as directed. (FOR ICD-9 250.00, 250.01). 1 each 0  . calcium carbonate (OS-CAL) 600 MG tablet Take 600 mg by mouth daily.    . carvedilol (COREG) 6.25 MG tablet TAKE 1 TABLET BY MOUTH TWICE DAILY WITH MEALS 180 tablet 0  . diclofenac Sodium (VOLTAREN) 1 % GEL  Apply 2 g topically 4 (four) times daily.    . Multiple Vitamin (MULTIVITAMIN WITH MINERALS) TABS tablet Take 1 tablet by mouth daily. Centrum Silver . In the morning    . oxybutynin (DITROPAN-XL) 5 MG 24 hr tablet Take 5 mg by mouth daily.    [name] Glycol-Propyl Glycol 0.4-0.3 % SOLN Place 1 drop into both eyes 3 (three) times daily as needed (for dry eyes.).    Marland Kitchen sertraline (ZOLOFT) 50 MG tablet Take 1/2 (one-half) tablet by mouth once daily 45 tablet 0  . TRULICITY 5.91 MB/8.4YK SOPN INJECT 1  SUBCUTANEOUSLY ONCE A WEEK 12 mL 0   No current facility-administered medications on file prior to visit.     ROS see history of present illness  Objective  Physical Exam Vitals:   08/29/20 1334  BP: 125/70  Pulse: 79  Temp: 98.4 F (36.9 C)  SpO2: 97%    BP Readings from Last 3 Encounters:  08/29/20 125/70  01/27/20 120/70  08/27/19 115/74   Wt Readings from Last 3 Encounters:  08/29/20 150 lb 6.4 oz (68.2 kg)  01/27/20 158 lb (71.7 kg)  11/24/19 152 lb (68.9 kg)    Physical Exam Constitutional:      General: She is not in acute distress.    Appearance: She is not diaphoretic.  Cardiovascular:     Rate and Rhythm: Normal rate and regular rhythm.     Heart sounds: Normal heart  sounds.  Pulmonary:     Effort: Pulmonary effort is normal.     Breath sounds: Normal breath sounds.  Abdominal:     General: Bowel sounds are normal. There is no distension.     Palpations: Abdomen is soft.     Tenderness: There is no abdominal tenderness. There is no guarding or rebound.  Musculoskeletal:     Right lower leg: No edema.     Left lower leg: No edema.  Skin:    General: Skin is warm and dry.  Neurological:     Mental Status: She is alert.    Diabetic Foot Exam - Simple   Simple Foot Form Diabetic Foot exam was performed with the following findings: Yes 08/29/2020  1:48 PM  Visual Inspection No deformities, no ulcerations, no other skin breakdown bilaterally:  Yes Sensation Testing Intact to touch and monofilament testing bilaterally: Yes Pulse Check Posterior Tibialis and Dorsalis pulse intact bilaterally: Yes Comments      Assessment/Plan: Please see individual problem list.  Problem List Items Addressed This Visit    DM (diabetes mellitus), type 2 with complications ([facility])    Check A1c.  She will continue Trulicity 9.58 mg once weekly.      Relevant Medications   atorvastatin (LIPITOR) 40 MG tablet   Other Relevant Orders   POCT HgB A1C   Essential hypertension    Well-controlled today.  She will start to check more frequently at home.  She will continue amlodipine 10 mg once daily and carvedilol 6.25 mg twice daily.      Relevant Medications   atorvastatin (LIPITOR) 40 MG tablet   Mixed hyperlipidemia    She will continue Lipitor 40 mg once daily.      Relevant Medications   atorvastatin (LIPITOR) 40 MG tablet   Depression    Asymptomatic.  She is only on Zoloft 25 mg daily.  She will discontinue this and monitor for recurrence of symptoms.      GERD (gastroesophageal reflux disease)    Has been having some reflux and what sounds to be dysphagia.  We will have her discontinue the Fosamax in case there is a lesion that could be worrisome.  We will start her on omeprazole once daily.  Will refer to GI.      Relevant Medications   omeprazole (PRILOSEC) 20 MG capsule   Other Relevant Orders   Ambulatory referral to Gastroenterology   Dysphagia    Refer to GI for evaluation.      Relevant Medications   omeprazole (PRILOSEC) 20 MG capsule   Other Relevant Orders   Ambulatory referral to Gastroenterology       This visit occurred during the SARS-CoV-2 public health emergency.  Safety protocols were in place, including screening questions prior to the visit, additional usage of staff PPE, and extensive cleaning of exam room while observing appropriate contact time as indicated for disinfecting solutions.    Tommi Rumps, MD [place]

## 2020-08-29 NOTE — Assessment & Plan Note (Signed)
Asymptomatic.  She is only on Zoloft 25 mg daily.  She will discontinue this and monitor for recurrence of symptoms.

## 2020-08-30 ENCOUNTER — Telehealth: Payer: Medicare HMO

## 2020-09-14 ENCOUNTER — Encounter: Payer: Self-pay | Admitting: Gastroenterology

## 2020-09-21 ENCOUNTER — Other Ambulatory Visit: Payer: Self-pay | Admitting: Family Medicine

## 2020-09-29 ENCOUNTER — Ambulatory Visit (INDEPENDENT_AMBULATORY_CARE_PROVIDER_SITE_OTHER): Payer: Medicare HMO | Admitting: Pharmacist

## 2020-09-29 DIAGNOSIS — I739 Peripheral vascular disease, unspecified: Secondary | ICD-10-CM

## 2020-09-29 DIAGNOSIS — I10 Essential (primary) hypertension: Secondary | ICD-10-CM

## 2020-09-29 DIAGNOSIS — E782 Mixed hyperlipidemia: Secondary | ICD-10-CM | POA: Diagnosis not present

## 2020-09-29 DIAGNOSIS — E118 Type 2 diabetes mellitus with unspecified complications: Secondary | ICD-10-CM

## 2020-09-29 DIAGNOSIS — Z8673 Personal history of transient ischemic attack (TIA), and cerebral infarction without residual deficits: Secondary | ICD-10-CM

## 2020-09-29 NOTE — Patient Instructions (Signed)
Tereka,   Keep up the fantastic work! Try taking the omeprazole about 30 minutes before eating breakfast.   [name] Feliz Beam, PharmD [phone]  Visit Information  PATIENT GOALS: Goals Addressed              This Visit's Progress     Patient Stated   .  Medication Monitoring (pt-stated)        Patient Goals/Self-Care Activities . Over the next 90 days, patient will:  - take medications as prescribed check glucose periodically, document, and provide at future appointments check blood pressure periodically, document, and provide at future appointments Focus on ways to remain connected to other people        The patient verbalized understanding of instructions, educational materials, and care plan provided today and agreed to receive a mailed copy of patient instructions, educational materials, and care plan.   Plan: Telephone follow up appointment with care management team member scheduled for:  ~ 12 weeks  [name] Feliz Beam, PharmD, [place], CPP Clinical Pharmacist [employer] at [employer] [phone]

## 2020-09-29 NOTE — Chronic Care Management (AMB) (Signed)
Chronic Care Management Pharmacy Note  09/29/2020 Name:  Crystal Miller MRN:  [id number] DOB:  [birth year]  Subjective: Crystal Miller is an 75 y.o. year old adult who is a primary patient of Sonnenberg, [name], MD.  The CCM team was consulted for assistance with disease management and care coordination needs.    Engaged with patient by telephone for follow up visit in response to provider referral for pharmacy case management and/or care coordination services.   Consent to Services:  The patient was given information about Chronic Care Management services, agreed to services, and gave verbal consent prior to initiation of services.  Please see initial visit note for detailed documentation.   Patient Care Team: [name], MD as PCP - General (Family Medicine) De Hollingshead, RPH-CPP as Pharmacist (Pharmacist)  Recent office visits:  4/11 - PCP - BP at goal; d/c sertraline per patient request d/t lack of symptoms; stop alendronate d/t GERD/dysphagia (in case lesion), start once daily omeprazole + GI referral; A1c at goal  Recent consult visits:  2/28 - nephrology Dr. Candiss Norse - BP slightly elevated, consider low dose ACEi/ARB moving forward; tx UTI  Hospital visits: None in previous 6 months  Objective:  Lab Results  Component Value Date   CREATININE 1.42 (H) 01/27/2020   CREATININE 1.47 (H) 09/11/2019   CREATININE 1.51 (H) 09/09/2019    Lab Results  Component Value Date   HGBA1C 6.2 (A) 08/29/2020   Last diabetic Eye exam:  Lab Results  Component Value Date/Time   HMDIABEYEEXA No Retinopathy 02/03/2020 03:37 PM    Last diabetic Foot exam: No results found for: HMDIABFOOTEX      Component Value Date/Time   CHOL 104 01/27/2020 1055   TRIG 147.0 01/27/2020 1055   HDL 35.40 (L) 01/27/2020 1055   CHOLHDL 3 01/27/2020 1055   VLDL 29.4 01/27/2020 1055   LDLCALC 39 01/27/2020 1055   LDLDIRECT 45.0 02/19/2018 1014    Hepatic Function Latest Ref Rng & Units  01/27/2020 10/22/2018 08/01/2018  Total Protein 6.0 - 8.3 g/dL 7.8 7.3 8.5(H)  Albumin 3.5 - 5.2 g/dL 4.3 4.0 4.3  AST 0 - 37 U/L '19 20 27  ' ALT 0 - 35 U/L '22 21 27  ' Alk Phosphatase 39 - 117 U/L 58 48 62  Total Bilirubin 0.2 - 1.2 mg/dL 0.5 0.4 0.7    Lab Results  Component Value Date/Time   TSH 4.61 (H) 04/18/2020 10:57 AM   TSH 6.70 (H) 01/27/2020 10:55 AM   FREET4 0.83 04/18/2020 10:57 AM   FREET4 0.86 02/12/2020 02:02 PM    CBC Latest Ref Rng & Units 01/27/2020 02/26/2019 10/22/2018  WBC 4.0 - 10.5 K/uL 8.6 7.5 7.2  Hemoglobin 12.0 - 15.0 g/dL 11.9(L) 11.8(L) 11.6(L)  Hematocrit 36.0 - 46.0 % 35.5(L) 35.0(L) 33.4(L)  Platelets 150.0 - 400.0 K/uL 285.0 300.0 288.0    Lab Results  Component Value Date/Time   VD25OH 30.56 01/27/2020 10:55 AM    Clinical ASCVD: Yes  The ASCVD Risk score Mikey Bussing DC Jr., et al., 2013) failed to calculate for the following reasons:   The patient has a prior MI or stroke diagnosis      Social History   Tobacco Use  Smoking Status Former Smoker  . Packs/day: 1.00  . Years: 20.00  . Pack years: 20.00  . Types: Cigarettes  . Quit date: 2012  . Years since quitting: 10.3  Smokeless Tobacco Never Used   BP Readings from Last 3 Encounters:  08/29/20 125/70  01/27/20 120/70  08/27/19 115/74   Pulse Readings from Last 3 Encounters:  08/29/20 79  01/27/20 65  08/27/19 69   Wt Readings from Last 3 Encounters:  08/29/20 150 lb 6.4 oz (68.2 kg)  01/27/20 158 lb (71.7 kg)  11/24/19 152 lb (68.9 kg)    Assessment: Review of patient past medical history, allergies, medications, health status, including review of consultants reports, laboratory and other test data, was performed as part of comprehensive evaluation and provision of chronic care management services.   SDOH:  (Social Determinants of Health) assessments and interventions performed:  SDOH Interventions   Flowsheet Row Most Recent Value  SDOH Interventions   Financial Strain  Interventions Intervention Not Indicated      CCM Care Plan  Allergies  Allergen Reactions  . Metformin And Related Diarrhea    Medications Reviewed Today    Reviewed by De Hollingshead, RPH-CPP (Pharmacist) on 09/29/20 at 1313  Med List Status: <None>  Medication Order Taking? Sig Documenting Provider Last Dose Status Informant  acetaminophen (TYLENOL) 500 MG tablet [id number] No Take 1,000 mg by mouth every 6 (six) hours as needed (for pain.).  Patient not taking: Reported on 09/29/2020   [provider] Not Taking Active Family Member           Med Note Mayo Ao Sep 07, 2019  1:21 PM)    amLODipine (NORVASC) 10 MG tablet [id number] Yes Take 1 tablet by mouth once daily [name], MD Taking Active   aspirin EC 81 MG EC tablet [id number] Yes Take 1 tablet (81 mg total) by mouth daily. Vaughan Basta, MD Taking Active Family Member  atorvastatin (LIPITOR) 40 MG tablet [id number] Yes TAKE 1 TABLET BY MOUTH ONCE DAILY AT  Meredith Staggers, MD Taking Active   blood glucose meter kit and supplies KIT [id number] Yes Dispense based on patient and insurance preference. Use up to four times daily as directed. (FOR ICD-9 250.00, 250.01). Schaevitz, Randall An, MD Taking Active Family Member  calcium carbonate (OS-CAL) 600 MG tablet [id number] Yes Take 600 mg by mouth daily. [provider] Taking Active   carvedilol (COREG) 6.25 MG tablet [id number] Yes TAKE 1 TABLET BY MOUTH TWICE DAILY WITH MEALS [name], MD Taking Active   diclofenac Sodium (VOLTAREN) 1 % GEL [id number] Yes Apply 2 g topically 4 (four) times daily. [provider] Taking Active   Multiple Vitamin (MULTIVITAMIN WITH MINERALS) TABS tablet [id number] Yes Take 1 tablet by mouth daily. Centrum Silver . In the morning [provider] Taking Active Family Member  omeprazole (PRILOSEC) 20 MG capsule [id number] Yes Take 1 capsule (20 mg total) by mouth  daily. [name], MD Taking Active   oxybutynin (DITROPAN-XL) 5 MG 24 hr tablet [id number] Yes Take 5 mg by mouth daily. [provider] Taking Active   Polyethyl Glycol-Propyl Glycol 0.4-0.3 % SOLN [id number] Yes Place 1 drop into both eyes 3 (three) times daily as needed (for dry eyes.). [provider] Taking Active Family Member           Med Note Mayo Ao Jun 01, 2019 25:42 AM)    TRULICITY 7.06 CB/7.6EG Bonney Aid [id number] Yes INJECT 1  SUBCUTANEOUSLY ONCE A WEEK [name], MD Taking Active           Patient Active Problem List   Diagnosis Date Noted  . Dysphagia 08/29/2020  . Subclinical hypothyroidism  04/23/2020  . Decreased energy 01/27/2020  . GERD (gastroesophageal reflux disease) 08/19/2019  . Diabetic retinopathy ([place]) 12/04/2018  . Anemia 10/03/2018  . Overactive bladder 06/30/2018  . Low back pain 02/19/2018  . Excessive gas 02/19/2018  . Depression 02/19/2018  . Right hip pain 10/30/2017  . Urine frequency 10/30/2017  . PAD (peripheral artery disease) ([place]) 10/10/2017  . Decreased pedal pulses 07/30/2017  . DM (diabetes mellitus), type 2 with complications ([place]) 57/84/6962  . Essential hypertension 11/01/2016  . Mixed hyperlipidemia 11/01/2016  . Memory change 11/01/2016  . History of stroke 10/01/2016    Immunization History  Administered Date(s) Administered  . Fluad Quad(high Dose 65+) 02/26/2019, 01/27/2020  . Influenza, High Dose Seasonal PF 02/19/2018  . PFIZER(Purple Top)SARS-COV-2 Vaccination 07/11/2019, 08/04/2019, 03/22/2020  . Pneumococcal Conjugate-13 10/30/2017  . Pneumococcal Polysaccharide-23 10/02/2016    Conditions to be addressed/monitored: HTN, HLD and DMII  Care Plan : Medication Management  Updates made by De Hollingshead, RPH-CPP since 09/29/2020 12:00 AM    Problem: Diabetes, HTN, HLD, CKD     Long-Range Goal: Disease Progression Prevention   Start Date: 06/17/2020  This  Visit's Progress: On track  Recent Progress: On track  Priority: High  Note:   Current Barriers:  . Unable to achieve control of diabetes   Pharmacist Clinical Goal(s):  Marland Kitchen Over the next 90 days, patient will achieve adherence to monitoring guidelines and medication adherence to achieve therapeutic efficacy through collaboration with PharmD and provider.   Interventions: . 1:1 collaboration with [name], MD regarding development and update of comprehensive plan of care as evidenced by provider attestation and co-signature . Inter-disciplinary care team collaboration (see longitudinal plan of care) . Comprehensive medication review performed; medication list updated in electronic medical record  Health Maintenance: . Reports she may need cataract surgery moving forward.   SDOH: . Enjoyed her trip to [place] with family. Is going to church now, enjoys getting out and about  Diabetes: . CONTROLLED; current treatment: Trulicity 9.52 mg weekly o Previously on Tradjenta, d/c when Trulicity started o Hx metformin, did not tolerate d/t diarrhea . Denies any issues with stomach upset, nausea. Still gas, but not intolerable . Current glucose readings: fasting and post prandial 100-120s . Praised for attainment of goal A1c. Continue current regimen at this time  Hypertension: . Controlled per report of home readings; current treatment: carvedilol 6.25 mg BID, amlodipine 10 mg daily . Current home readings: generally 120-130s/70-80s . Denies hypotensive symptoms, including lightheadedness, dizziness . Nephrology mentioned consideration for low dose ACEi/ARB in the future. Hx hyperkalemia. Continue to follow. . Continue current regimen at this time  Hyperlipidemia and secondary ASCVD prevention (hx CVA) . Controlled per last lipid panel; current treatment: atorvastatin 40 mg daily . Current antiplatelet regimen: aspirin 81 mg daily . Recommended to continue current regimen at this  time  Depression/Anxiety: . Controlled per patient report; current treatment: none o Hx sertraline - asked to discontinue, as she doesn't feel she needed it . Denies any increased depression or anxiety symptoms with sertraline discontinuation. Advised and educated to let us know if recurrence of symptoms in the future . Continue activities that she enjoys, such as going to church  Osteoporosis: . Current treatment: none o Stopped alendronate d/t reports of GERD, dysphagia; per chart review, had been on at least since 2018 -> 2022 . Supplementation: Centrum Silver Womens MVI (has Vitamin D 1000 mcg and Calcium 300 mg) daily + calcium 600 mg daily . F/u  results of GI referral. Continue supplementation at this time  Overactive Bladder: . Controlled per patient report; current regimen: oxybutynin XL 5 mg daily . Continue current regimen at this time  GERD/dysphagia: . Reports improvement in dysphagia, though still occasional acid reflux symptoms. Current regimen: omeprazole 20 mg daily after breakfast . Reports she tries to not eat after 4 pm (bedtime around 8 pm). Feels that Activia yogurt helps reduce symptoms . Advised to move administration to ~30 minutes before breakfast. Follow up with GI next week as scheduled.    Patient Goals/Self-Care Activities . Over the next 90 days, patient will:  - take medications as prescribed check glucose periodically, document, and provide at future appointments check blood pressure periodically, document, and provide at future appointments Focus on ways to remain connected to other people  Follow Up Plan: Telephone follow up appointment with care management team member scheduled for: ~ 12 weeks     Medication Assistance: None required.  Patient affirms current coverage meets needs.  Patient's preferred pharmacy is:  [facility] [address] [place] [address] Phone: [phone] Fax:  [phone]  Follow Up:  Patient agrees to Care Plan and Follow-up.  Plan: Telephone follow up appointment with care management team member scheduled for:  ~ 12 weeks  [name], PharmD, [place], [place] Clinical Pharmacist [employer] [facility] [employer] [phone]

## 2020-10-03 ENCOUNTER — Other Ambulatory Visit: Payer: Self-pay | Admitting: Family Medicine

## 2020-10-03 DIAGNOSIS — E118 Type 2 diabetes mellitus with unspecified complications: Secondary | ICD-10-CM

## 2020-10-04 ENCOUNTER — Ambulatory Visit: Payer: Medicare HMO | Admitting: Gastroenterology

## 2020-10-24 ENCOUNTER — Other Ambulatory Visit: Payer: Self-pay | Admitting: Family Medicine

## 2020-10-24 DIAGNOSIS — Z1231 Encounter for screening mammogram for malignant neoplasm of breast: Secondary | ICD-10-CM

## 2020-11-24 ENCOUNTER — Ambulatory Visit: Payer: Medicare HMO

## 2020-11-27 ENCOUNTER — Other Ambulatory Visit: Payer: Self-pay | Admitting: Family Medicine

## 2020-11-27 DIAGNOSIS — E782 Mixed hyperlipidemia: Secondary | ICD-10-CM

## 2020-11-29 ENCOUNTER — Ambulatory Visit (INDEPENDENT_AMBULATORY_CARE_PROVIDER_SITE_OTHER): Payer: Medicare HMO

## 2020-11-29 VITALS — Ht 63.0 in | Wt 150.0 lb

## 2020-11-29 DIAGNOSIS — Z0000 Encounter for general adult medical examination without abnormal findings: Secondary | ICD-10-CM | POA: Diagnosis not present

## 2020-11-29 NOTE — Progress Notes (Signed)
Subjective:   Crystal Miller is a 75 y.o. female who presents for Medicare Annual (Subsequent) preventive examination.  Review of Systems    No ROS.  Medicare Wellness Virtual Visit.  Visual/audio telehealth visit, UTA vital signs.   See social history for additional risk factors.   Cardiac Risk Factors include: advanced age (>71mn, >>63women);diabetes mellitus     Objective:    Today's Vitals   11/29/20 1450  Weight: 150 lb (68 kg)  Height: _0  (1.6 m)   Body mass index is 26.57 kg/m.  Advanced Directives 11/29/2020 11/24/2019 11/20/2018 08/01/2018 08/01/2018 12/31/2017 10/30/2017  Does Patient Have a Medical Advance Directive? _1  No Yes  Type of AParamedicof ANiederwaldLiving will Out of facility DNR (pink MOST or yellow form) Out of facility DNR (pink MOST or yellow form) Out of facility DNR (pink MOST or yellow form) Out of facility DNR (pink MOST or yellow form) - HPress photographerOut of facility DNR (pink MOST or yellow form)  Does patient want to make changes to medical advance directive? No - Patient declined No - Patient declined No - Patient declined No - Patient declined No - Patient declined - No - Patient declined  Copy of H[place]in Chart? Yes - validated most recent copy scanned in chart (See row information) - - - - - No - copy requested  Would patient like information on creating a medical advance directive? - - - - - No - Patient declined -  Pre-existing out of facility DNR order (yellow form or pink MOST form) - - - Yellow form placed in chart (order not valid for inpatient use) Yellow form placed in chart (order not valid for inpatient use) - -    Current Medications (verified) Outpatient Encounter Medications as of 11/29/2020  Medication Sig   acetaminophen (TYLENOL) 500 MG tablet Take 1,000 mg by mouth every 6 (six) hours as needed (for pain.). (Patient not taking: Reported on 09/29/2020)    amLODipine (NORVASC) 10 MG tablet Take 1 tablet by mouth once daily   aspirin EC 81 MG EC tablet Take 1 tablet (81 mg total) by mouth daily.   atorvastatin (LIPITOR) 40 MG tablet TAKE 1 TABLET BY MOUTH ONCE DAILY AT  6PM   blood glucose meter kit and supplies KIT Dispense based on patient and insurance preference. Use up to four times daily as directed. (FOR ICD-9 250.00, 250.01).   calcium carbonate (OS-CAL) 600 MG tablet Take 600 mg by mouth daily.   carvedilol (COREG) 6.25 MG tablet TAKE 1 TABLET BY MOUTH TWICE DAILY WITH MEALS   diclofenac Sodium (VOLTAREN) 1 % GEL Apply 2 g topically 4 (four) times daily.   Multiple Vitamin (MULTIVITAMIN WITH MINERALS) TABS tablet Take 1 tablet by mouth daily. Centrum Silver . In the morning   omeprazole (PRILOSEC) 20 MG capsule Take 1 capsule (20 mg total) by mouth daily.   oxybutynin (DITROPAN-XL) 5 MG 24 hr tablet Take 5 mg by mouth daily.   Polyethyl Glycol-Propyl Glycol 0.4-0.3 % SOLN Place 1 drop into both eyes 3 (three) times daily as needed (for dry eyes.).   TRULICITY 08.01MKP/5.3ZSSOPN INJECT   0.75 MG SUBCUTANEOUSLY ONCE A WEEK   No facility-administered encounter medications on file as of 11/29/2020.    Allergies (verified) Metformin and related   History: Past Medical History:  Diagnosis Date   Acute pyelonephritis 08/01/2018   Depression    husband  died in 2017   DM (diabetes mellitus), type 2 with complications ([place]) 9/37/3428   Essential hypertension 11/01/2016   GERD (gastroesophageal reflux disease)    Mixed hyperlipidemia 11/01/2016   Peripheral vascular disease ([place])    Renal insufficiency    Right pontine stroke ([place]) 03/27/2017   Stroke ([place]) 2018   slight residual with speech. does not drive Lt side weakness   Past Surgical History:  Procedure Laterality Date   CHOLECYSTECTOMY     LOWER EXTREMITY ANGIOGRAPHY Left 12/31/2017   Procedure: LOWER EXTREMITY ANGIOGRAPHY;  Surgeon: Katha Cabal, MD;  Location: [facility];  Service: Cardiovascular;  Laterality: Left;   LOWER EXTREMITY ANGIOGRAPHY Right 04/22/2018   Procedure: LOWER EXTREMITY ANGIOGRAPHY;  Surgeon: Katha Cabal, MD;  Location: [facility];  Service: Cardiovascular;  Laterality: Right;   Family History  Problem Relation Age of Onset   Alzheimer's disease Mother    Arthritis Mother    Hypertension Mother    CVA Father    CAD Father    Heart disease Father    Stroke Father    Hypertension Father    Stroke Brother    Breast cancer Neg Hx    Social History   Socioeconomic History   Marital status: Widowed    Spouse name: Not on file   Number of children: Not on file   Years of education: Not on file   Highest education level: Not on file  Occupational History   Occupation: Scientist, water quality    Comment: retired  Tobacco Use   Smoking status: Former    Packs/day: 1.00    Years: 20.00    Pack years: 20.00    Types: Cigarettes    Quit date: 2012    Years since quitting: 10.5   Smokeless tobacco: Never  Vaping Use   Vaping Use: Never used  Substance and Sexual Activity   Alcohol use: No   Drug use: No   Sexual activity: Not Currently    Birth control/protection: Post-menopausal  Other Topics Concern   Not on file  Social History Narrative   Not on file   Social Determinants of Health   Financial Resource Strain: Low Risk    Difficulty of Paying Living Expenses: Not hard at all  Food Insecurity: No Food Insecurity   Worried About Charity fundraiser in the Last Year: Never true   [place] in the Last Year: Never true  Transportation Needs: No Transportation Needs   Lack of Transportation (Medical): No   Lack of Transportation (Non-Medical): No  Physical Activity: Sufficiently Active   Days of Exercise per Week: 7 days   Minutes of Exercise per Session: 30 min  Stress: No Stress Concern Present   Feeling of Stress : Not at all  Social Connections: Moderately Integrated   Frequency of  Communication with Friends and Family: More than three times a week   Frequency of Social Gatherings with Friends and Family: More than three times a week   Attends Religious Services: More than 4 times per year   Active Member of [employer] or Organizations: Yes   Attends Archivist Meetings: 1 to 4 times per year   Marital Status: Widowed    Tobacco Counseling Counseling given: Not Answered   Clinical Intake:  Pre-visit preparation completed: Yes        Diabetes: Yes (Followed by pcp)  How often do you need to have someone help you when  you read instructions, pamphlets, or other written materials from your doctor or pharmacy?: 2 - Rarely  Nutrition Risk Assessment: Has the patient had any N/V/D within the last 2 months?  No  Does the patient have any non-healing wounds?  No  Has the patient had any unintentional weight loss or weight gain?  No    Diabetes Management: Is the patient seen by Chronic Care Management for management of their diabetes?  Yes   Interpreter Needed?: No      Activities of Daily Living In your present state of health, do you have any difficulty performing the following activities: 11/29/2020  Hearing? N  Vision? N  Difficulty concentrating or making decisions? N  Walking or climbing stairs? Y  Dressing or bathing? N  Doing errands, shopping? Y  Comment She does not Physiological scientist and eating ? N  Using the Toilet? N  In the past six months, have you accidently leaked urine? Y  Comment Managed with medication and daily depend  Do you have problems with loss of bowel control? N  Managing your Medications? N  Managing your Finances? Y  Comment Daughter assist as needed  Housekeeping or managing your Housekeeping? Y  Comment Daughter assist as needed  Some recent data might be hidden    Patient Care Team: [name], MD as PCP - General (Family Medicine) De Hollingshead, RPH-CPP as Pharmacist  (Pharmacist)  Indicate any recent Medical Services you may have received from other than [facility] providers in the past year (date may be approximate).     Assessment:   This is a routine wellness examination for Jagu[place].  I connected with Armanda today by telephone and verified that I am speaking with the correct person using two identifiers. Location patient: home Location provider: work Persons participating in the virtual visit: patient, Marine scientist.    I discussed the limitations, risks, security and privacy concerns of performing an evaluation and management service by telephone and the availability of in person appointments. The patient expressed understanding and verbally consented to this telephonic visit.    Interactive audio and video telecommunications were attempted between this provider and patient, however failed, due to patient having technical difficulties OR patient did not have access to video capability.  We continued and completed visit with audio only.  Some vital signs may be absent or patient reported.   Hearing/Vision screen Hearing Screening - Comments:: Patient is able to hear conversational tones without difficulty.  No issues reported.   Vision Screening - Comments:: Wears corrective lenses They have seen their ophthalmologist in the last 12 months.    Dietary issues and exercise activities discussed: Current Exercise Habits: Home exercise routine, Type of exercise: walking;stretching, Time (Minutes): 30, Frequency (Times/Week): 7, Weekly Exercise (Minutes/Week): 210, Intensity: Mild Healthy diet Good fluid intake   Goals Addressed               This Visit's Progress     Patient Stated     COMPLETED: HEMOGLOBIN A1C < 7 (pt-stated)        Maintain levels       Other     Maintain Healthy Diet        Walk for exercise Low carb diet Stay hydrated        Depression Screen PHQ 2/9 Scores 11/29/2020 08/29/2020 06/17/2020 01/27/2020 01/27/2020 11/24/2019  08/19/2019  PHQ - 2 Score 0 0 2 2 0 0 0  PHQ- 9 Score - - 3 9 - - -  Fall Risk Fall Risk  11/29/2020 08/29/2020 01/27/2020 11/24/2019 08/19/2019  Falls in the past year? 0 0 0 0 0  Number falls in past yr: 0 0 0 0 0  Injury with Fall? 0 - 0 - -  Follow up _0     FALL RISK PREVENTION PERTAINING TO THE HOME: Handrails in use when climbing stairs? Yes Home free of loose throw rugs in walkways, pet beds, electrical cords, etc? Yes  Adequate lighting in your home to reduce risk of falls? Yes   ASSISTIVE DEVICES UTILIZED TO PREVENT FALLS: Life alert? No  Use of a cane, walker or w/c? Yes  Grab bars in the bathroom? Yes  Shower chair or bench in shower? Yes  Elevated toilet seat or a handicapped toilet? Yes   TIMED UP AND GO: Was the test performed? No .   Cognitive Function:  Patient is alert and oriented x3.    6CIT Screen 11/24/2019 11/20/2018 10/30/2017  What Year? 0 points 0 points 0 points  What month? 0 points 0 points 0 points  What time? - 0 points 0 points  Count back from 20 0 points 0 points 0 points  Months in reverse 0 points 0 points 0 points  Repeat phrase 0 points 0 points 0 points  Total Score - 0 0    Immunizations Immunization History  Administered Date(s) Administered   Fluad Quad(high Dose 65+) 02/26/2019, 01/27/2020   Influenza, High Dose Seasonal PF 02/19/2018   PFIZER(Purple Top)SARS-COV-2 Vaccination 07/11/2019, 08/04/2019, 03/22/2020   Pneumococcal Conjugate-13 10/30/2017   Pneumococcal Polysaccharide-23 10/02/2016    TDAP status: Due, Education has been provided regarding the importance of this vaccine. Advised may receive this vaccine at local pharmacy or Health Dept. Aware to provide a copy of the vaccination record if obtained from local pharmacy or Health Dept. Verbalized acceptance and understanding. Deferred.   Covid  vaccine- 3 completed.   Shingles vaccine- Due, Education has been provided regarding the importance of this vaccine. Advised may receive this vaccine at local pharmacy or Health Dept. Aware to provide a copy of the vaccination record if obtained from local pharmacy or Health Dept. Verbalized acceptance and understanding. Deferred  Health Maintenance Health Maintenance  Topic Date Due   COVID-19 Vaccine (4 - Booster for [place] series) 12/15/2020 (Originally 07/20/2020)   Zoster Vaccines- Shingrix (1 of 2) 03/01/2021 (Originally 07/21/1994)   TETANUS/TDAP  11/29/2021 (Originally [birth year]21/1963)   INFLUENZA VACCINE  12/19/2020   OPHTHALMOLOGY EXAM  02/02/2021   HEMOGLOBIN A1C  02/28/2021   URINE MICROALBUMIN  07/19/2021   FOOT EXAM  08/29/2021   DEXA SCAN  Completed   PNA vac Low Risk Adult  Completed   HPV VACCINES  Aged Out   Mammogram- ordered 10/2020. Plans to schedule.   Vision Screening: Recommended annual ophthalmology exams for early detection of glaucoma and other disorders of the eye. Is the patient up to date with their annual eye exam?  Yes   Dental Screening: Recommended annual dental exams for proper oral hygiene.    Plan:   Keep all routine maintenance appointments.   I have personally reviewed and noted the following in the patient's chart:   Medical and social history Use of alcohol, tobacco or illicit drugs  Current medications and supplements including opioid prescriptions.  Functional ability and status Nutritional status Physical activity Advanced directives List of other physicians Hospitalizations, surgeries, and ER visits in previous 72  months Vitals Screenings to include cognitive, depression, and falls Referrals and appointments  In addition, I have reviewed and discussed with patient certain preventive protocols, quality metrics, and best practice recommendations. A written personalized care plan for preventive services as well as general preventive health  recommendations were provided to patient via mail.     Varney Biles, LPN   05/29/3233

## 2020-11-29 NOTE — Patient Instructions (Addendum)
Crystal Miller , Thank you for taking time to come for your Medicare Wellness Visit. I appreciate your ongoing commitment to your health goals. Please review the following plan we discussed and let me know if I can assist you in the future.   These are the goals we discussed:  Goals       Patient Stated     Medication Monitoring (pt-stated)      Patient Goals/Self-Care Activities Over the next 90 days, patient will:  - take medications as prescribed check glucose periodically, document, and provide at future appointments check blood pressure periodically, document, and provide at future appointments Focus on ways to remain connected to other people        Other     Maintain Healthy Diet      Walk for exercise Low carb diet Stay hydrated         This is a list of the screening recommended for you and due dates:  Health Maintenance  Topic Date Due   COVID-19 Vaccine (4 - Booster for Pfizer series) 12/15/2020*   Zoster (Shingles) Vaccine (1 of 2) 03/01/2021*   Tetanus Vaccine  11/29/2021*   Flu Shot  12/19/2020   Eye exam for diabetics  02/02/2021   Hemoglobin A1C  02/28/2021   Urine Protein Check  07/19/2021   Complete foot exam   08/29/2021   DEXA scan (bone density measurement)  Completed   Pneumonia vaccines  Completed   HPV Vaccine  Aged Out  *Topic was postponed. The date shown is not the original due date.    Advanced directives: on file  Conditions/risks identified: none new  Next appointment: Follow up in one year for your annual wellness visit    Preventive Care 65 Years and Older, Female Preventive care refers to lifestyle choices and visits with your health care provider that can promote health and wellness. What does preventive care include? A yearly physical exam. This is also called an annual well check. Dental exams once or twice a year. Routine eye exams. Ask your health care provider how often you should have your eyes checked. Personal  lifestyle choices, including: Daily care of your teeth and gums. Regular physical activity. Eating a healthy diet. Avoiding tobacco and drug use. Limiting alcohol use. Practicing safe sex. Taking low-dose aspirin every day. Taking vitamin and mineral supplements as recommended by your health care provider. What happens during an annual well check? The services and screenings done by your health care provider during your annual well check will depend on your age, overall health, lifestyle risk factors, and family history of disease. Counseling  Your health care provider may ask you questions about your: Alcohol use. Tobacco use. Drug use. Emotional well-being. Home and relationship well-being. Sexual activity. Eating habits. History of falls. Memory and ability to understand (cognition). Work and work Astronomer. Reproductive health. Screening  You may have the following tests or measurements: Height, weight, and BMI. Blood pressure. Lipid and cholesterol levels. These may be checked every 5 years, or more frequently if you are over 73 years old. Skin check. Lung cancer screening. You may have this screening every year starting at age 9 if you have a 30-pack-year history of smoking and currently smoke or have quit within the past 15 years. Fecal occult blood test (FOBT) of the stool. You may have this test every year starting at age 41. Flexible sigmoidoscopy or colonoscopy. You may have a sigmoidoscopy every 5 years or a colonoscopy every 10 years starting  at age 60. Hepatitis C blood test. Hepatitis B blood test. Sexually transmitted disease (STD) testing. Diabetes screening. This is done by checking your blood sugar (glucose) after you have not eaten for a while (fasting). You may have this done every 1-3 years. Bone density scan. This is done to screen for osteoporosis. You may have this done starting at age 93. Mammogram. This may be done every 1-2 years. Talk to your  health care provider about how often you should have regular mammograms. Talk with your health care provider about your test results, treatment options, and if necessary, the need for more tests. Vaccines  Your health care provider may recommend certain vaccines, such as: Influenza vaccine. This is recommended every year. Tetanus, diphtheria, and acellular pertussis (Tdap, Td) vaccine. You may need a Td booster every 10 years. Zoster vaccine. You may need this after age 37. Pneumococcal 13-valent conjugate (PCV13) vaccine. One dose is recommended after age 54. Pneumococcal polysaccharide (PPSV23) vaccine. One dose is recommended after age 3. Talk to your health care provider about which screenings and vaccines you need and how often you need them. This information is not intended to replace advice given to you by your health care provider. Make sure you discuss any questions you have with your health care provider. Document Released: 06/03/2015 Document Revised: 01/25/2016 Document Reviewed: 03/08/2015 Elsevier Interactive Patient Education  2017 [place] Prevention in the Home Falls can cause injuries. They can happen to people of all ages. There are many things you can do to make your home safe and to help prevent falls. What can I do on the outside of my home? Regularly fix the edges of walkways and driveways and fix any cracks. Remove anything that might make you trip as you walk through a door, such as a raised step or threshold. Trim any bushes or trees on the path to your home. Use bright outdoor lighting. Clear any walking paths of anything that might make someone trip, such as rocks or tools. Regularly check to see if handrails are loose or broken. Make sure that both sides of any steps have handrails. Any raised decks and porches should have guardrails on the edges. Have any leaves, snow, or ice cleared regularly. Use sand or salt on walking paths during winter. Clean  up any spills in your garage right away. This includes oil or grease spills. What can I do in the bathroom? Use night lights. Install grab bars by the toilet and in the tub and shower. Do not use towel bars as grab bars. Use non-skid mats or decals in the tub or shower. If you need to sit down in the shower, use a plastic, non-slip stool. Keep the floor dry. Clean up any water that spills on the floor as soon as it happens. Remove soap buildup in the tub or shower regularly. Attach bath mats securely with double-sided non-slip rug tape. Do not have throw rugs and other things on the floor that can make you trip. What can I do in the bedroom? Use night lights. Make sure that you have a light by your bed that is easy to reach. Do not use any sheets or blankets that are too big for your bed. They should not hang down onto the floor. Have a firm chair that has side arms. You can use this for support while you get dressed. Do not have throw rugs and other things on the floor that can make you trip. What can  I do in the kitchen? Clean up any spills right away. Avoid walking on wet floors. Keep items that you use a lot in easy-to-reach places. If you need to reach something above you, use a strong step stool that has a grab bar. Keep electrical cords out of the way. Do not use floor polish or wax that makes floors slippery. If you must use wax, use non-skid floor wax. Do not have throw rugs and other things on the floor that can make you trip. What can I do with my stairs? Do not leave any items on the stairs. Make sure that there are handrails on both sides of the stairs and use them. Fix handrails that are broken or loose. Make sure that handrails are as long as the stairways. Check any carpeting to make sure that it is firmly attached to the stairs. Fix any carpet that is loose or worn. Avoid having throw rugs at the top or bottom of the stairs. If you do have throw rugs, attach them to the  floor with carpet tape. Make sure that you have a light switch at the top of the stairs and the bottom of the stairs. If you do not have them, ask someone to add them for you. What else can I do to help prevent falls? Wear shoes that: Do not have high heels. Have rubber bottoms. Are comfortable and fit you well. Are closed at the toe. Do not wear sandals. If you use a stepladder: Make sure that it is fully opened. Do not climb a closed stepladder. Make sure that both sides of the stepladder are locked into place. Ask someone to hold it for you, if possible. Clearly mark and make sure that you can see: Any grab bars or handrails. First and last steps. Where the edge of each step is. Use tools that help you move around (mobility aids) if they are needed. These include: Canes. Walkers. Scooters. Crutches. Turn on the lights when you go into a dark area. Replace any light bulbs as soon as they burn out. Set up your furniture so you have a clear path. Avoid moving your furniture around. If any of your floors are uneven, fix them. If there are any pets around you, be aware of where they are. Review your medicines with your doctor. Some medicines can make you feel dizzy. This can increase your chance of falling. Ask your doctor what other things that you can do to help prevent falls. This information is not intended to replace advice given to you by your health care provider. Make sure you discuss any questions you have with your health care provider. Document Released: 03/03/2009 Document Revised: 10/13/2015 Document Reviewed: 06/11/2014 Elsevier Interactive Patient Education  2017 [employer].

## 2020-12-06 ENCOUNTER — Ambulatory Visit
Admission: RE | Admit: 2020-12-06 | Discharge: 2020-12-06 | Disposition: A | Discharge status: Home / Self Care | Payer: Medicare HMO | Source: Ambulatory Visit | Attending: Family Medicine | Admitting: Family Medicine

## 2020-12-06 ENCOUNTER — Other Ambulatory Visit: Payer: Self-pay

## 2020-12-06 DIAGNOSIS — Z1231 Encounter for screening mammogram for malignant neoplasm of breast: Secondary | ICD-10-CM

## 2020-12-15 ENCOUNTER — Ambulatory Visit: Payer: Medicare HMO | Admitting: Physician Assistant

## 2020-12-15 ENCOUNTER — Encounter: Payer: Self-pay | Admitting: Physician Assistant

## 2020-12-15 VITALS — BP 140/62 | HR 88 | Ht 59.25 in | Wt 145.2 lb

## 2020-12-15 DIAGNOSIS — K219 Gastro-esophageal reflux disease without esophagitis: Secondary | ICD-10-CM | POA: Diagnosis not present

## 2020-12-15 DIAGNOSIS — R059 Cough, unspecified: Secondary | ICD-10-CM

## 2020-12-15 DIAGNOSIS — R1310 Dysphagia, unspecified: Secondary | ICD-10-CM | POA: Diagnosis not present

## 2020-12-15 MED ORDER — OMEPRAZOLE 20 MG PO CPDR
20.0000 mg | DELAYED_RELEASE_CAPSULE | Freq: Every day | ORAL | 1 refills | Status: DC
Start: 2020-12-15 — End: 2021-02-27

## 2020-12-15 NOTE — Progress Notes (Signed)
Subjective:    Patient ID: Crystal Miller, adult    DOB: Mar 17, 1946, 75 y.o.   MRN: [id number]  HPI Crystal Miller is a pleasant 75 year old female, new to GI today referred by Dr. Tommi Rumps for evaluation of dysphagia.  Patient has not had any prior GI evaluation. She does have history of hypertension, peripheral arterial disease, adult onset diabetes mellitus, and is status post CVA in 2018. Patient says that her dysphagia symptoms started after she had the CVA in 2018 and that she has had some symptoms since.  She says that the stroke significantly affected her speech initially, however she has recovered that with time.  She relates intermittent symptoms with the dysphagia saying that she will choke or strangle intermittently with drinking liquids or water and also with solid food. She had been on Fosamax which was stopped recently once it was realized she was having dysphagia symptoms and she has been started on omeprazole.  She says she was having some reflux symptoms prior to coming off the Fosamax and starting PPI but those symptoms have improved.  She describes what sounds like sour brash and frequent coughing.  She says she has not had any "attacks" over the past couple of weeks.  Again she is not having symptoms on an every meal or every day basis. She has altered her diet and says she is also eating earlier and allowing herself a couple of hours after meals before lying down at night.  She has had a lot of gas. Regarding colon screening she said she did a Cologuard earlier this year which was negative.  Review of Systems Pertinent positive and negative review of systems were noted in the above HPI section.  All other review of systems was otherwise negative.   Outpatient Encounter Medications as of 12/15/2020  Medication Sig   acetaminophen (TYLENOL) 500 MG tablet Take 1,000 mg by mouth every 6 (six) hours as needed (for pain.).   amLODipine (NORVASC) 10 MG tablet Take 1 tablet by mouth once  daily   aspirin EC 81 MG EC tablet Take 1 tablet (81 mg total) by mouth daily.   atorvastatin (LIPITOR) 40 MG tablet TAKE 1 TABLET BY MOUTH ONCE DAILY AT  6PM   blood glucose meter kit and supplies KIT Dispense based on patient and insurance preference. Use up to four times daily as directed. (FOR ICD-9 250.00, 250.01).   calcium carbonate (OS-CAL) 600 MG tablet Take 600 mg by mouth daily.   carvedilol (COREG) 6.25 MG tablet TAKE 1 TABLET BY MOUTH TWICE DAILY WITH MEALS   diclofenac Sodium (VOLTAREN) 1 % GEL Apply 2 g topically 4 (four) times daily.   Multiple Vitamin (MULTIVITAMIN WITH MINERALS) TABS tablet Take 1 tablet by mouth daily. Centrum Silver . In the morning   oxybutynin (DITROPAN-XL) 5 MG 24 hr tablet Take 5 mg by mouth daily.   Polyethyl Glycol-Propyl Glycol 0.4-0.3 % SOLN Place 1 drop into both eyes 3 (three) times daily as needed (for dry eyes.).   TRULICITY 8.25 KN/3.9JQ SOPN INJECT   0.75 MG SUBCUTANEOUSLY ONCE A WEEK   [DISCONTINUED] omeprazole (PRILOSEC) 20 MG capsule Take 1 capsule (20 mg total) by mouth daily.   omeprazole (PRILOSEC) 20 MG capsule Take 1 capsule (20 mg total) by mouth daily before breakfast.   No facility-administered encounter medications on file as of 12/15/2020.   Allergies  Allergen Reactions   Metformin And Related Diarrhea   Patient Active Problem List   Diagnosis Date Noted  Dysphagia 08/29/2020   Subclinical hypothyroidism 04/23/2020   Decreased energy 01/27/2020   GERD (gastroesophageal reflux disease) 08/19/2019   Diabetic retinopathy ([place]) 12/04/2018   Anemia 10/03/2018   Overactive bladder 06/30/2018   Low back pain 02/19/2018   Excessive gas 02/19/2018   Depression 02/19/2018   Right hip pain 10/30/2017   Urine frequency 10/30/2017   PAD (peripheral artery disease) ([place]) 10/10/2017   Decreased pedal pulses 07/30/2017   DM (diabetes mellitus), type 2 with complications ([place]) 25/42/7062   Essential hypertension 11/01/2016   Mixed  hyperlipidemia 11/01/2016   Memory change 11/01/2016   History of stroke 10/01/2016   Social History   Socioeconomic History   Marital status: Widowed    Spouse name: Not on file   Number of children: 3   Years of education: Not on file   Highest education level: Not on file  Occupational History   Occupation: Scientist, water quality    Comment: retired   Occupation: retired  Tobacco Use   Smoking status: Former    Packs/day: 1.00    Years: 20.00    Pack years: 20.00    Types: Cigarettes    Quit date: 2012    Years since quitting: 10.5   Smokeless tobacco: Never  Vaping Use   Vaping Use: Never used  Substance and Sexual Activity   Alcohol use: No   Drug use: No   Sexual activity: Not Currently    Birth control/protection: Post-menopausal  Other Topics Concern   Not on file  Social History Narrative   Not on file   Social Determinants of Health   Financial Resource Strain: Low Risk    Difficulty of Paying Living Expenses: Not hard at all  Food Insecurity: No Food Insecurity   Worried About Charity fundraiser in the Last Year: Never true   Arboriculturist in the Last Year: Never true  Transportation Needs: No Transportation Needs   Lack of Transportation (Medical): No   Lack of Transportation (Non-Medical): No  Physical Activity: Sufficiently Active   Days of Exercise per Week: 7 days   Minutes of Exercise per Session: 30 min  Stress: No Stress Concern Present   Feeling of Stress : Not at all  Social Connections: Moderately Integrated   Frequency of Communication with Friends and Family: More than three times a week   Frequency of Social Gatherings with Friends and Family: More than three times a week   Attends Religious Services: More than 4 times per year   Active Member of [employer] or Organizations: Yes   Attends Archivist Meetings: 1 to 4 times per year   Marital Status: Widowed  Intimate Partner Violence: Not At Risk   Fear of Current or  Ex-Partner: No   Emotionally Abused: No   Physically Abused: No   Sexually Abused: No    Crystal Miller's family history includes Alzheimer's disease in her mother; Arthritis in her mother and sister; Bone cancer in her brother; CAD in her father; CVA in her father; Diabetes in her brother and son; Heart disease in her brother and father; Hypertension in her father and mother; Lung cancer in her brother; Stroke in her brother and father.      Objective:    Vitals:   12/15/20 1026  BP: 140/62  Pulse: 88    Physical Exam  Well-developed well-nourished elderly female in no acute distress.  Height, Weight, 145 BMI 29.0 ambulates with a walker  HEENT; nontraumatic normocephalic, EOMI,  PE R LA, sclera anicteric. Oropharynx; not examined today Neck; supple, no JVD Cardiovascular; regular rate and rhythm with S1-S2, no murmur rub or gallop Pulmonary; Clear bilaterally Abdomen; soft, nontender, nondistended, no palpable mass or hepatosplenomegaly, bowel sounds are active Rectal; not done today Skin; benign exam, no jaundice rash or appreciable lesions Extremities; no clubbing cyanosis or edema skin warm and dry Neuro/Psych; alert and oriented x4, grossly nonfocal mood and affect appropriate        Assessment & Plan:   #41 75 year old female with complaints of intermittent dysphagia to solids and liquids, and intermittent coughing and strangling with p.o. intake.  Symptoms have been present over the past 4 years since CVA. Rule out neurogenic dysphagia, rule out possible GERD with stricture, rule out dysmotility.  Possible occasional aspiration episodes #2.  GERD with sour brash and intermittent cough-improved off Fosamax and on PPI #3 colon cancer screening-negative Cologuard earlier this year #4 status post CVA 2018-initially had aphasia, some persistent gait disturbance #5 peripheral arterial disease #6.  Adult onset diabetes mellitus #7.  Hypertension  Plan; continue omeprazole 20  mg p.o. every morning AC breakfast Remain off Fosamax Patient will be scheduled with a barium swallow with a tablet as initial exam.  Pending results of barium swallow can determine if EGD is indicated and/or if speech path evaluation would be helpful.  Brad Mcgaughy S Tal Neer PA-C 12/15/2020   Cc: [name], MD

## 2020-12-15 NOTE — Patient Instructions (Signed)
If you are age 76 or older, your body mass index should be between 23-30. Your Body mass index is 29.09 kg/m. If this is out of the aforementioned range listed, please consider follow up with your Primary Care Provider. __________________________________________________________  The [facility] providers would like to encourage you to use [facility] to communicate with providers for non-urgent requests or questions.  Due to long hold times on the telephone, sending your provider a message by [facility] may be a faster and more efficient way to get a response.  Please allow 48 business hours for a response.  Please remember that this is for non-urgent requests.   You have been scheduled for a Barium Esophogram at [facility] (1st floor of the hospital) on 12/20/2020 at 10:30 am. Please arrive 15 minutes prior to your appointment for registration. Make certain not to have anything to eat or drink 3 hours prior to your test. If you need to reschedule for any reason, please contact radiology at [phone] to do so. __________________________________________________________________ A barium swallow is an examination that concentrates on views of the esophagus. This tends to be a double contrast exam (barium and two liquids which, when combined, create a gas to distend the wall of the oesophagus) or single contrast (non-ionic iodine based). The study is usually tailored to your symptoms so a good history is essential. Attention is paid during the study to the form, structure and configuration of the esophagus, looking for functional disorders (such as aspiration, dysphagia, achalasia, motility and reflux) EXAMINATION You may be asked to change into a gown, depending on the type of swallow being performed. A radiologist and radiographer will perform the procedure. The radiologist will advise you of the type of contrast selected for your procedure and direct you during the exam. You will be asked to stand, sit  or lie in several different positions and to hold a small amount of fluid in your mouth before being asked to swallow while the imaging is performed .In some instances you may be asked to swallow barium coated marshmallows to assess the motility of a solid food bolus. The exam can be recorded as a digital or video fluoroscopy procedure. POST PROCEDURE It will take 1-2 days for the barium to pass through your system. To facilitate this, it is important, unless otherwise directed, to increase your fluids for the next 24-48hrs and to resume your normal diet.  This test typically takes about 30 minutes to perform. __________________________________________________________________________________  Continue Omeprazole 20 mg 1 capsule before breakfast  Follow an anti-reflux/GERD diet.  Follow up pending the results of your Barium Swallow.  Thank you for entrusting me with your care and choosing [facility].  Amy Esterwood, PA-C

## 2020-12-20 ENCOUNTER — Ambulatory Visit (HOSPITAL_COMMUNITY)
Admission: RE | Admit: 2020-12-20 | Discharge: 2020-12-20 | Disposition: A | Discharge status: Home / Self Care | Payer: Medicare HMO | Source: Ambulatory Visit | Attending: Physician Assistant | Admitting: Physician Assistant

## 2020-12-20 ENCOUNTER — Telehealth: Payer: Self-pay | Admitting: Physician Assistant

## 2020-12-20 ENCOUNTER — Other Ambulatory Visit: Payer: Self-pay

## 2020-12-20 DIAGNOSIS — K219 Gastro-esophageal reflux disease without esophagitis: Secondary | ICD-10-CM | POA: Diagnosis not present

## 2020-12-20 DIAGNOSIS — R059 Cough, unspecified: Secondary | ICD-10-CM

## 2020-12-20 DIAGNOSIS — R1310 Dysphagia, unspecified: Secondary | ICD-10-CM | POA: Insufficient documentation

## 2020-12-20 DIAGNOSIS — K224 Dyskinesia of esophagus: Secondary | ICD-10-CM | POA: Diagnosis not present

## 2020-12-20 NOTE — Telephone Encounter (Signed)
Inbound call from patient returning call for results

## 2020-12-20 NOTE — Telephone Encounter (Signed)
Patient calling wanting results of Barium Swallow done. Informed patient that once results are available the office will contact her. Patient verbalized understanding, nothing further at this time.  Last OV 7/28  Please advise,thank you

## 2020-12-22 ENCOUNTER — Other Ambulatory Visit: Payer: Self-pay | Admitting: Family Medicine

## 2020-12-26 ENCOUNTER — Other Ambulatory Visit: Payer: Self-pay | Admitting: Family Medicine

## 2021-01-02 ENCOUNTER — Ambulatory Visit (INDEPENDENT_AMBULATORY_CARE_PROVIDER_SITE_OTHER): Payer: Medicare HMO | Admitting: Pharmacist

## 2021-01-02 ENCOUNTER — Other Ambulatory Visit: Payer: Self-pay | Admitting: Family Medicine

## 2021-01-02 DIAGNOSIS — Z8673 Personal history of transient ischemic attack (TIA), and cerebral infarction without residual deficits: Secondary | ICD-10-CM

## 2021-01-02 DIAGNOSIS — I10 Essential (primary) hypertension: Secondary | ICD-10-CM

## 2021-01-02 DIAGNOSIS — E118 Type 2 diabetes mellitus with unspecified complications: Secondary | ICD-10-CM | POA: Diagnosis not present

## 2021-01-02 DIAGNOSIS — E782 Mixed hyperlipidemia: Secondary | ICD-10-CM | POA: Diagnosis not present

## 2021-01-02 DIAGNOSIS — K219 Gastro-esophageal reflux disease without esophagitis: Secondary | ICD-10-CM

## 2021-01-02 NOTE — Chronic Care Management (AMB) (Signed)
Chronic Care Management Pharmacy Note  01/02/2021 Name:  Crystal Miller MRN:  [id number] DOB:  Oct 17, 1945   Subjective: Crystal Miller is an 75 y.o. year old adult who is a primary patient of Sonnenberg, [name], MD.  The CCM team was consulted for assistance with disease management and care coordination needs.    Engaged with patient by telephone for follow up visit in response to provider referral for pharmacy case management and/or care coordination services.   Consent to Services:  The patient was given information about Chronic Care Management services, agreed to services, and gave verbal consent prior to initiation of services.  Please see initial visit note for detailed documentation.   Patient Care Team: [name], MD as PCP - General (Family Medicine) De Hollingshead, RPH-CPP as Pharmacist (Pharmacist)  Recent consult visits: 7/28 - GI [place]- continue omeprazole 20 mg QAM, remain off alendronate- schedule barium swallow 8/2 - barium swallow - recommend diet modification - eat slowly, cut food into small pieces and chew carefully, sipping fluids between bites.    Objective:  Lab Results  Component Value Date   CREATININE 1.42 (H) 01/27/2020   CREATININE 1.47 (H) 09/11/2019   CREATININE 1.51 (H) 09/09/2019    Lab Results  Component Value Date   HGBA1C 6.2 (A) 08/29/2020   Last diabetic Eye exam:  Lab Results  Component Value Date/Time   HMDIABEYEEXA No Retinopathy 02/03/2020 03:37 PM    Last diabetic Foot exam: No results found for: HMDIABFOOTEX      Component Value Date/Time   CHOL 104 01/27/2020 1055   TRIG 147.0 01/27/2020 1055   HDL 35.40 (L) 01/27/2020 1055   CHOLHDL 3 01/27/2020 1055   VLDL 29.4 01/27/2020 1055   LDLCALC 39 01/27/2020 1055   LDLDIRECT 45.0 02/19/2018 1014    Hepatic Function Latest Ref Rng & Units 01/27/2020 10/22/2018 08/01/2018  Total Protein 6.0 - 8.3 g/dL 7.8 7.3 8.5(H)  Albumin 3.5 - 5.2 g/dL 4.3 4.0 4.3  AST 0 -  37 U/L '19 20 27  ' ALT 0 - 35 U/L '22 21 27  ' Alk Phosphatase 39 - 117 U/L 58 48 62  Total Bilirubin 0.2 - 1.2 mg/dL 0.5 0.4 0.7    Lab Results  Component Value Date/Time   TSH 4.61 (H) 04/18/2020 10:57 AM   TSH 6.70 (H) 01/27/2020 10:55 AM   FREET4 0.83 04/18/2020 10:57 AM   FREET4 0.86 02/12/2020 02:02 PM    CBC Latest Ref Rng & Units 01/27/2020 02/26/2019 10/22/2018  WBC 4.0 - 10.5 K/uL 8.6 7.5 7.2  Hemoglobin 12.0 - 15.0 g/dL 11.9(L) 11.8(L) 11.6(L)  Hematocrit 36.0 - 46.0 % 35.5(L) 35.0(L) 33.4(L)  Platelets 150.0 - 400.0 K/uL 285.0 300.0 288.0    Lab Results  Component Value Date/Time   VD25OH 30.56 01/27/2020 10:55 AM    Clinical ASCVD: Yes  The ASCVD Risk score Mikey Bussing DC Jr., et al., 2013) failed to calculate for the following reasons:   The patient has a prior MI or stroke diagnosis     Social History   Tobacco Use  Smoking Status Former   Packs/day: 1.00   Years: 20.00   Pack years: 20.00   Types: Cigarettes   Quit date: 2012   Years since quitting: 10.6  Smokeless Tobacco Never   BP Readings from Last 3 Encounters:  12/15/20 140/62  08/29/20 125/70  01/27/20 120/70   Pulse Readings from Last 3 Encounters:  12/15/20 88  08/29/20 79  01/27/20 65  Wt Readings from Last 3 Encounters:  12/15/20 145 lb 4 oz (65.9 kg)  11/29/20 150 lb (68 kg)  08/29/20 150 lb 6.4 oz (68.2 kg)    Assessment: Review of patient past medical history, allergies, medications, health status, including review of consultants reports, laboratory and other test data, was performed as part of comprehensive evaluation and provision of chronic care management services.   SDOH:  (Social Determinants of Health) assessments and interventions performed:  SDOH Interventions    Flowsheet Row Most Recent Value  SDOH Interventions   Financial Strain Interventions Intervention Not Indicated       CCM Care Plan  Allergies  Allergen Reactions   Metformin And Related Diarrhea     Medications Reviewed Today     Reviewed by De Hollingshead, RPH-CPP (Pharmacist) on 01/02/21 at 1308  Med List Status: <None>   Medication Order Taking? Sig Documenting Provider Last Dose Status Informant  acetaminophen (TYLENOL) 500 MG tablet [id number] Yes Take 1,000 mg by mouth every 6 (six) hours as needed (for pain.). [provider] Taking Active            Med Note Darnelle Maffucci, Waynette Buttery Sep 07, 2019  1:21 PM)    amLODipine (NORVASC) 10 MG tablet [id number] Yes Take 1 tablet by mouth once daily [name], MD Taking Active   aspirin EC 81 MG EC tablet [id number] Yes Take 1 tablet (81 mg total) by mouth daily. Vaughan Basta, MD Taking Active Family Member  atorvastatin (LIPITOR) 40 MG tablet [id number] Yes TAKE 1 TABLET BY MOUTH ONCE DAILY AT  Meredith Staggers, MD Taking Active   blood glucose meter kit and supplies KIT [id number] Yes Dispense based on patient and insurance preference. Use up to four times daily as directed. (FOR ICD-9 250.00, 250.01). Schaevitz, Randall An, MD Taking Active Family Member  calcium carbonate (OS-CAL) 600 MG tablet [id number] Yes Take 600 mg by mouth daily. [provider] Taking Active   carvedilol (COREG) 6.25 MG tablet [id number] Yes TAKE 1 TABLET BY MOUTH TWICE DAILY WITH MEALS [name], MD Taking Active   diclofenac Sodium (VOLTAREN) 1 % GEL [id number] No Apply 2 g topically 4 (four) times daily.  Patient not taking: Reported on 01/02/2021   [provider] Not Taking Active   Multiple Vitamin (MULTIVITAMIN WITH MINERALS) TABS tablet [id number] Yes Take 1 tablet by mouth daily. Centrum Silver . In the morning [provider] Taking Active Family Member  omeprazole (PRILOSEC) 20 MG capsule [id number] Yes Take 1 capsule (20 mg total) by mouth daily before breakfast. Trellis Paganini, Amy S, PA-C Taking Active   Polyethyl Glycol-Propyl Glycol 0.4-0.3 % SOLN [id number] Yes Place 1 drop into  both eyes 3 (three) times daily as needed (for dry eyes.). [provider] Taking Active Family Member           Med Note Mayo Ao Jun 01, 2019 70:96 AM)    TRULICITY 2.83 MO/2.9UT Bonney Aid [id number] Yes INJECT   0.75 MG SUBCUTANEOUSLY ONCE A WEEK [name], MD Taking Active             Patient Active Problem List   Diagnosis Date Noted   Dysphagia 08/29/2020   Subclinical hypothyroidism 04/23/2020   Decreased energy 01/27/2020   GERD (gastroesophageal reflux disease) 08/19/2019   Diabetic retinopathy ([place]) 12/04/2018   Anemia 10/03/2018   Overactive bladder 06/30/2018   Low back pain 02/19/2018   Excessive gas  02/19/2018   Depression 02/19/2018   Right hip pain 10/30/2017   Urine frequency 10/30/2017   PAD (peripheral artery disease) ([place]) 10/10/2017   Decreased pedal pulses 07/30/2017   DM (diabetes mellitus), type 2 with complications ([place]) 78/41/2820   Essential hypertension 11/01/2016   Mixed hyperlipidemia 11/01/2016   Memory change 11/01/2016   History of stroke 10/01/2016    Immunization History  Administered Date(s) Administered   Fluad Quad(high Dose 65+) 02/26/2019, 01/27/2020   Influenza, High Dose Seasonal PF 02/19/2018   PFIZER(Purple Top)SARS-COV-2 Vaccination 07/11/2019, 08/04/2019, 03/22/2020   Pneumococcal Conjugate-13 10/30/2017   Pneumococcal Polysaccharide-23 10/02/2016    Conditions to be addressed/monitored: HTN, HLD, and DMII  Care Plan : Medication Management  Updates made by De Hollingshead, RPH-CPP since 01/02/2021 12:00 AM     Problem: Diabetes, HTN, HLD, CKD      Long-Range Goal: Disease Progression Prevention   Start Date: 06/17/2020  This Visit's Progress: On track  Recent Progress: On track  Priority: High  Note:   Current Barriers:  Unable to achieve control of diabetes   Pharmacist Clinical Goal(s):  Over the next 90 days, patient will achieve adherence to monitoring guidelines and  medication adherence to achieve therapeutic efficacy through collaboration with PharmD and provider.   Interventions: 1:1 collaboration with [name], MD regarding development and update of comprehensive plan of care as evidenced by provider attestation and co-signature Inter-disciplinary care team collaboration (see longitudinal plan of care) Comprehensive medication review performed; medication list updated in electronic medical record  Health Maintenance: Overdue for PCP f/u. Assisted in scheduling today.   SDOH: Asks about transportation benefits. Will place Care Guide referral to guide patient.  Diabetes: Controlled per last A1c; current treatment: Trulicity 8.13 mg weekly Previously on Tradjenta, d/c when Trulicity started Hx metformin, did not tolerate d/t diarrhea Denies any issues with stomach upset, nausea, some constipation. Notes that Activia yogurt has helped with regularity.  Current glucose readings: fasting and post prandial 120-140s  Current meal patterns: cutting back on acids, monitoring carbohydrate content Current physical activity: walks outside every day, walked with friends. Tries to do it several times a day when weather is nice Recommended to continue current regimen at this time.   Hypertension: Controlled per report of home readings; current treatment: carvedilol 6.25 mg BID, amlodipine 10 mg daily Nephrology mentioned consideration for low dose ACEi/ARB in the future. Hx hyperkalemia. Continue to follow any changes.  Current home readings: generally 120-130s/70-80s Denies hypotensive symptoms, including lightheadedness, dizziness. Denies any falls since our last call.  Wears compression hose to help with occasional edema.  Recommended to continue current regimen at this time  Hyperlipidemia and secondary ASCVD prevention (hx CVA) Controlled per last lipid panel; current treatment: atorvastatin 40 mg daily Current antiplatelet regimen: aspirin 81  mg daily Recommended to continue current regimen at this time  Osteoporosis: Current treatment: none Stopped alendronate d/t reports of GERD, dysphagia; per chart review, had been on at least since 2018 -> 2022 Supplementation: Centrum Silver Womens MVI (has Vitamin D 1000 mcg and Calcium 300 mg) daily + calcium 600 mg daily GI agreed to continue to hold alendronate. Appropriate to continue bisphosphonate holiday at this time. If need for treatment resumption moving forward, consider IM/IV options.   Overactive Bladder: Controlled per patient report; current regimen: none Reports she had been taking oxybutynin, but did not perceive much benefit so decided to discontinue. Denies need for therapy or follow up about this at this time.  GERD/dysphagia: Moderately well controlled at this time per patient report; Current regimen: omeprazole 20 mg daily after breakfast. Reports issues if she eats acidic foods - had spaghetti yesterday and triggered symptoms Reviewed GI recommendations for diet modification - small bites, chewing slowly, sips of beverage between bites.  Recommended to continue current regimen at this time  Patient Goals/Self-Care Activities Over the next 90 days, patient will:  - take medications as prescribed check glucose periodically, document, and provide at future appointments check blood pressure periodically, document, and provide at future appointments Focus on ways to remain connected to other people  Follow Up Plan: Telephone follow up appointment with care management team member scheduled for: ~ 12 weeks     Medication Assistance: None required.  Patient affirms current coverage meets needs.  Patient's preferred pharmacy is:  [facility] [address] [place] [address] Phone: [phone] Fax: [phone]    Follow Up:  Patient agrees to Care Plan and Follow-up.  Plan: Telephone follow up appointment  with care management team member scheduled for:  ~ 12 weeks  [name], PharmD, [place], [place] Clinical Pharmacist [employer] at [employer] [phone]

## 2021-01-02 NOTE — Patient Instructions (Signed)
Visit Information  PATIENT GOALS:  Goals Addressed               This Visit's Progress     Patient Stated     Medication Monitoring (pt-stated)        Patient Goals/Self-Care Activities Over the next 90 days, patient will:  - take medications as prescribed check glucose periodically, document, and provide at future appointments check blood pressure periodically, document, and provide at future appointments Focus on ways to remain connected to other people        Patient verbalizes understanding of instructions provided today and agrees to view in MyChart.   Plan: Telephone follow up appointment with care management team member scheduled for:  ~ 12 weeks  [name] Feliz Beam, PharmD, [place], CPP Clinical Pharmacist [employer] at [employer] [phone]

## 2021-01-12 ENCOUNTER — Other Ambulatory Visit: Payer: Self-pay

## 2021-01-12 ENCOUNTER — Ambulatory Visit: Payer: Medicare HMO | Admitting: Family Medicine

## 2021-01-12 ENCOUNTER — Ambulatory Visit (INDEPENDENT_AMBULATORY_CARE_PROVIDER_SITE_OTHER): Payer: Medicare HMO | Admitting: Family Medicine

## 2021-01-12 ENCOUNTER — Encounter: Payer: Self-pay | Admitting: Family Medicine

## 2021-01-12 VITALS — BP 120/60 | HR 97 | Temp 98.8°F | Ht 59.0 in | Wt 145.2 lb

## 2021-01-12 DIAGNOSIS — E782 Mixed hyperlipidemia: Secondary | ICD-10-CM | POA: Diagnosis not present

## 2021-01-12 DIAGNOSIS — E038 Other specified hypothyroidism: Secondary | ICD-10-CM

## 2021-01-12 DIAGNOSIS — E11319 Type 2 diabetes mellitus with unspecified diabetic retinopathy without macular edema: Secondary | ICD-10-CM | POA: Diagnosis not present

## 2021-01-12 DIAGNOSIS — E875 Hyperkalemia: Secondary | ICD-10-CM | POA: Diagnosis not present

## 2021-01-12 DIAGNOSIS — I739 Peripheral vascular disease, unspecified: Secondary | ICD-10-CM

## 2021-01-12 DIAGNOSIS — E118 Type 2 diabetes mellitus with unspecified complications: Secondary | ICD-10-CM

## 2021-01-12 DIAGNOSIS — I10 Essential (primary) hypertension: Secondary | ICD-10-CM | POA: Diagnosis not present

## 2021-01-12 LAB — LIPID PANEL
Cholesterol: 75 mg/dL (ref 0–200)
HDL: 26.6 mg/dL — ABNORMAL LOW (ref >39.00)
LDL Cholesterol: 11 mg/dL (ref 0–99)
NonHDL: 48.53
Total CHOL/HDL Ratio: 3
Triglycerides: 186 mg/dL — ABNORMAL HIGH (ref 0.0–149.0)
VLDL: 37.2 mg/dL (ref 0.0–40.0)

## 2021-01-12 LAB — COMPREHENSIVE METABOLIC PANEL
ALT: 29 U/L (ref 0–35)
AST: 24 U/L (ref 0–37)
Albumin: 3.8 g/dL (ref 3.5–5.2)
Alkaline Phosphatase: 83 U/L (ref 39–117)
BUN: 22 mg/dL (ref 6–23)
CO2: 23 mEq/L (ref 19–32)
Calcium: 9.3 mg/dL (ref 8.4–10.5)
Chloride: 107 mEq/L (ref 96–112)
Creatinine, Ser: 1.48 mg/dL — ABNORMAL HIGH (ref 0.40–1.20)
GFR: 34.19 mL/min — ABNORMAL LOW (ref >60.00)
Glucose, Bld: 116 mg/dL — ABNORMAL HIGH (ref 70–99)
Potassium: 5.4 mEq/L — ABNORMAL HIGH (ref 3.5–5.1)
Sodium: 138 mEq/L (ref 135–145)
Total Bilirubin: 0.4 mg/dL (ref 0.2–1.2)
Total Protein: 7.3 g/dL (ref 6.0–8.3)

## 2021-01-12 LAB — HEMOGLOBIN A1C: Hgb A1c MFr Bld: 6.7 % — ABNORMAL HIGH (ref 4.6–6.5)

## 2021-01-12 LAB — TSH: TSH: 4.93 u[IU]/mL (ref 0.35–5.50)

## 2021-01-12 LAB — T4, FREE: Free T4: 0.81 ng/dL (ref 0.60–1.60)

## 2021-01-12 MED ORDER — TETANUS-DIPHTHERIA TOXOIDS TD 5-2 LFU IM INJ
0.5000 mL | INJECTION | Freq: Once | INTRAMUSCULAR | 0 refills | Status: AC
Start: 2021-01-12 — End: 2021-01-12

## 2021-01-12 NOTE — Patient Instructions (Addendum)
Nice to see you. Please get your tetanus vaccine, Shingrix vaccine, and COVID booster at your pharmacy.  If you would like to get your flu vaccine at our office please let us know that you can get this at the pharmacy as well. We will get lab work today and contact you with the results.

## 2021-01-12 NOTE — Assessment & Plan Note (Signed)
Check TSH 

## 2021-01-12 NOTE — Assessment & Plan Note (Signed)
Adequate control for age.  She will continue amlodipine 10 mg once daily. 

## 2021-01-12 NOTE — Assessment & Plan Note (Signed)
Continue risk factor management.  Check labs today.

## 2021-01-12 NOTE — Assessment & Plan Note (Signed)
Check lipid panel.  Continue Lipitor 40 mg once daily. °

## 2021-01-12 NOTE — Progress Notes (Signed)
Tommi Rumps, MD Phone: [phone]  Crystal Miller is a 75 y.o. adult who presents today for f/u.  HYPERTENSION Disease Monitoring: Blood pressure [id number] systolic Chest pain- no      Dyspnea- no Medications: Compliance- taking amlodipine    Edema- no  DIABETES Disease Monitoring: Blood Sugar ranges-12 though only checking 2x/week Polyuria/phagia/dipsia- no      Optho- UTD Medications: Compliance- taking trulicity Hypoglycemic symptoms- no  HYPERLIPIDEMIA Disease Monitoring: See symptoms for Hypertension Medications: Compliance- taking lipitor Right upper quadrant pain- no  Muscle aches- no    Social History   Tobacco Use  Smoking Status Former   Packs/day: 1.00   Years: 20.00   Pack years: 20.00   Types: Cigarettes   Quit date: 2012   Years since quitting: 10.6  Smokeless Tobacco Never    Current Outpatient Medications on File Prior to Visit  Medication Sig Dispense Refill   acetaminophen (TYLENOL) 500 MG tablet Take 1,000 mg by mouth every 6 (six) hours as needed (for pain.).     amLODipine (NORVASC) 10 MG tablet Take 1 tablet by mouth once daily 90 tablet 1   aspirin EC 81 MG EC tablet Take 1 tablet (81 mg total) by mouth daily. 30 tablet 2   atorvastatin (LIPITOR) 40 MG tablet TAKE 1 TABLET BY MOUTH ONCE DAILY AT  6PM 90 tablet 0   blood glucose meter kit and supplies KIT Dispense based on patient and insurance preference. Use up to four times daily as directed. (FOR ICD-9 250.00, 250.01). 1 each 0   calcium carbonate (OS-CAL) 600 MG tablet Take 600 mg by mouth daily.     carvedilol (COREG) 6.25 MG tablet TAKE 1 TABLET BY MOUTH TWICE DAILY WITH MEALS 180 tablet 0   diclofenac Sodium (VOLTAREN) 1 % GEL Apply 2 g topically 4 (four) times daily.     Multiple Vitamin (MULTIVITAMIN WITH MINERALS) TABS tablet Take 1 tablet by mouth daily. Centrum Silver . In the morning     omeprazole (PRILOSEC) 20 MG capsule Take 1 capsule (20 mg total) by mouth daily  before breakfast. 30 capsule 1   Polyethyl Glycol-Propyl Glycol 0.4-0.3 % SOLN Place 1 drop into both eyes 3 (three) times daily as needed (for dry eyes.).     TRULICITY 0.25 KY/7.0WC SOPN INJECT 0.75 MG  SUBCUTANEOUSLY ONCE A WEEK 12 mL 0   No current facility-administered medications on file prior to visit.     ROS see history of present illness  Objective  Physical Exam Vitals:   01/12/21 1344  BP: 120/60  Pulse: 97  Temp: 98.8 F (37.1 C)  SpO2: 97%    BP Readings from Last 3 Encounters:  01/12/21 120/60  12/15/20 140/62  08/29/20 125/70   Wt Readings from Last 3 Encounters:  01/12/21 145 lb 3.2 oz (65.9 kg)  12/15/20 145 lb 4 oz (65.9 kg)  11/29/20 150 lb (68 kg)    Physical Exam Constitutional:      General: She is not in acute distress.    Appearance: She is not diaphoretic.  Cardiovascular:     Rate and Rhythm: Normal rate and regular rhythm.     Heart sounds: Normal heart sounds.  Pulmonary:     Effort: Pulmonary effort is normal.     Breath sounds: Normal breath sounds.  Musculoskeletal:     Right lower leg: No edema.     Left lower leg: No edema.  Skin:    General: Skin is warm and dry.  Neurological:  Mental Status: She is alert.     Assessment/Plan: Please see individual problem list.  Problem List Items Addressed This Visit     Diabetic retinopathy ([place])    She will continue to see ophthalmology.      DM (diabetes mellitus), type 2 with complications (HCC)    Check A1c.  Continue Trulicity 3.57 mg weekly.      Relevant Orders   HgB A1c   Essential hypertension    Adequate control for age.  She will continue amlodipine 10 mg once daily.      Relevant Orders   Comp Met (CMET)   Mixed hyperlipidemia    Check lipid panel.  Continue Lipitor 40 mg once daily.      Relevant Orders   Comp Met (CMET)   Lipid panel   PAD (peripheral artery disease) ([place])    Continue risk factor management.  Check labs today.      Relevant  Orders   Comp Met (CMET)   Lipid panel   HgB A1c   Subclinical hypothyroidism    Check TSH.      Relevant Orders   TSH   T4, free     Health Maintenance: I encouraged the patient to get the COVID booster.  She will get her Shingrix vaccine, tetanus vaccine, and flu vaccine at the pharmacy.  Return in about 6 months (around 07/15/2021).  This visit occurred during the SARS-CoV-2 public health emergency.  Safety protocols were in place, including screening questions prior to the visit, additional usage of staff PPE, and extensive cleaning of exam room while observing appropriate contact time as indicated for disinfecting solutions.    Tommi Rumps, MD [place]

## 2021-01-12 NOTE — Assessment & Plan Note (Signed)
She will continue to see ophthalmology. 

## 2021-01-12 NOTE — Assessment & Plan Note (Signed)
Check A1c.  Continue Trulicity 0.75 mg weekly. 

## 2021-01-16 DIAGNOSIS — I10 Essential (primary) hypertension: Secondary | ICD-10-CM | POA: Diagnosis not present

## 2021-01-16 DIAGNOSIS — I69354 Hemiplegia and hemiparesis following cerebral infarction affecting left non-dominant side: Secondary | ICD-10-CM | POA: Diagnosis not present

## 2021-01-16 DIAGNOSIS — E1151 Type 2 diabetes mellitus with diabetic peripheral angiopathy without gangrene: Secondary | ICD-10-CM | POA: Diagnosis not present

## 2021-01-16 DIAGNOSIS — R69 Illness, unspecified: Secondary | ICD-10-CM | POA: Diagnosis not present

## 2021-01-16 DIAGNOSIS — Z008 Encounter for other general examination: Secondary | ICD-10-CM | POA: Diagnosis not present

## 2021-01-16 DIAGNOSIS — E1136 Type 2 diabetes mellitus with diabetic cataract: Secondary | ICD-10-CM | POA: Diagnosis not present

## 2021-01-16 DIAGNOSIS — E1165 Type 2 diabetes mellitus with hyperglycemia: Secondary | ICD-10-CM | POA: Diagnosis not present

## 2021-01-16 DIAGNOSIS — E785 Hyperlipidemia, unspecified: Secondary | ICD-10-CM | POA: Diagnosis not present

## 2021-01-16 DIAGNOSIS — K219 Gastro-esophageal reflux disease without esophagitis: Secondary | ICD-10-CM | POA: Diagnosis not present

## 2021-01-16 DIAGNOSIS — G8929 Other chronic pain: Secondary | ICD-10-CM | POA: Diagnosis not present

## 2021-01-16 DIAGNOSIS — M1990 Unspecified osteoarthritis, unspecified site: Secondary | ICD-10-CM | POA: Diagnosis not present

## 2021-01-19 NOTE — Addendum Note (Signed)
Addended by: Charlyne Mom D on: 01/19/2021 11:42 AM   Modules accepted: Orders

## 2021-01-20 ENCOUNTER — Other Ambulatory Visit: Payer: Self-pay

## 2021-01-20 ENCOUNTER — Other Ambulatory Visit (INDEPENDENT_AMBULATORY_CARE_PROVIDER_SITE_OTHER): Payer: Medicare HMO

## 2021-01-20 DIAGNOSIS — E875 Hyperkalemia: Secondary | ICD-10-CM | POA: Diagnosis not present

## 2021-01-20 LAB — POTASSIUM: Potassium: 4.9 mEq/L (ref 3.5–5.1)

## 2021-01-25 ENCOUNTER — Ambulatory Visit: Payer: Medicare HMO | Admitting: Family Medicine

## 2021-01-30 DIAGNOSIS — N1832 Chronic kidney disease, stage 3b: Secondary | ICD-10-CM | POA: Diagnosis not present

## 2021-01-30 DIAGNOSIS — E1122 Type 2 diabetes mellitus with diabetic chronic kidney disease: Secondary | ICD-10-CM | POA: Diagnosis not present

## 2021-01-30 DIAGNOSIS — I10 Essential (primary) hypertension: Secondary | ICD-10-CM | POA: Diagnosis not present

## 2021-02-03 DIAGNOSIS — Z0100 Encounter for examination of eyes and vision without abnormal findings: Secondary | ICD-10-CM | POA: Diagnosis not present

## 2021-02-03 DIAGNOSIS — E113393 Type 2 diabetes mellitus with moderate nonproliferative diabetic retinopathy without macular edema, bilateral: Secondary | ICD-10-CM | POA: Diagnosis not present

## 2021-02-03 LAB — HM DIABETES EYE EXAM

## 2021-02-26 ENCOUNTER — Other Ambulatory Visit: Payer: Self-pay | Admitting: Family Medicine

## 2021-02-26 ENCOUNTER — Other Ambulatory Visit: Payer: Self-pay | Admitting: Physician Assistant

## 2021-02-26 DIAGNOSIS — E782 Mixed hyperlipidemia: Secondary | ICD-10-CM

## 2021-03-28 ENCOUNTER — Other Ambulatory Visit: Payer: Self-pay | Admitting: Family Medicine

## 2021-03-28 ENCOUNTER — Other Ambulatory Visit: Payer: Self-pay | Admitting: Physician Assistant

## 2021-03-28 DIAGNOSIS — E118 Type 2 diabetes mellitus with unspecified complications: Secondary | ICD-10-CM

## 2021-04-03 ENCOUNTER — Telehealth: Payer: Self-pay | Admitting: Pharmacist

## 2021-04-03 ENCOUNTER — Ambulatory Visit (INDEPENDENT_AMBULATORY_CARE_PROVIDER_SITE_OTHER): Payer: Medicare HMO | Admitting: Pharmacist

## 2021-04-03 DIAGNOSIS — I10 Essential (primary) hypertension: Secondary | ICD-10-CM

## 2021-04-03 DIAGNOSIS — E782 Mixed hyperlipidemia: Secondary | ICD-10-CM

## 2021-04-03 DIAGNOSIS — E118 Type 2 diabetes mellitus with unspecified complications: Secondary | ICD-10-CM

## 2021-04-03 DIAGNOSIS — I739 Peripheral vascular disease, unspecified: Secondary | ICD-10-CM

## 2021-04-03 DIAGNOSIS — Z20828 Contact with and (suspected) exposure to other viral communicable diseases: Secondary | ICD-10-CM

## 2021-04-03 MED ORDER — OSELTAMIVIR PHOSPHATE 75 MG PO CAPS: 75.0000 mg | ORAL_CAPSULE | Freq: Every day | ORAL | 0 refills | Status: DC

## 2021-04-03 NOTE — Telephone Encounter (Signed)
Called patient. Reviewed as below. Counseled to take with food, GI upset and headache most common medication related side effects.   Reviewed to call us for treatment update if she becomes symptomatic.   Reviewed that it is very important for patient to pursue seasonal influenza vaccine in the next several weeks, once she is sure she did not develop influenza (or once she recovers).

## 2021-04-03 NOTE — Chronic Care Management (AMB) (Signed)
Chronic Care Management CCM Pharmacy Note  04/03/2021 Name:  Crystal Miller MRN:  [id number] DOB:  [birth year]  Summary: - Tolerating medication regimen well - Exposed to flu, grandson became symptomatic on 11/12, tested positive today.   Recommendations/Changes made from today's visit: - Continue current regimen at this time - Collaborated with provider of the day for influenza post exposure prophylaxis  Subjective: Crystal Miller is an 75 y.o. year old adult who is a primary patient of [name], [name], MD.  The CCM team was consulted for assistance with disease management and care coordination needs.    Engaged with patient by telephone for follow up visit for pharmacy case management and/or care coordination services.   Objective:  Medications Reviewed Today     Reviewed by Burnard Hawthorne, FNP (Family Nurse Practitioner) on 04/03/21 at [place] List Status: <None>   Medication Order Taking? Sig Documenting Provider Last Dose Status Informant  acetaminophen (TYLENOL) 500 MG tablet [id number] No Take 1,000 mg by mouth every 6 (six) hours as needed (for pain.). [provider] Taking Active            Med Note Darnelle Maffucci, Waynette Buttery Sep 07, 2019  1:21 PM)    amLODipine (NORVASC) 10 MG tablet [id number] No Take 1 tablet by mouth once daily [name], MD Taking Active   aspirin EC 81 MG EC tablet [id number] No Take 1 tablet (81 mg total) by mouth daily. Vaughan Basta, MD Taking Active Family Member  atorvastatin (LIPITOR) 40 MG tablet [id number] No TAKE 1 TABLET BY MOUTH ONCE DAILY AT  Meredith Staggers, MD Taking Active   blood glucose meter kit and supplies KIT [id number] No Dispense based on patient and insurance preference. Use up to four times daily as directed. (FOR ICD-9 250.00, 250.01). Schaevitz, Randall An, MD Taking Active Family Member  calcium carbonate (OS-CAL) 600 MG tablet [id number] No Take 600 mg by mouth daily. [provider] Taking Active   carvedilol (COREG) 6.25 MG tablet [id number] No TAKE 1 TABLET BY MOUTH TWICE DAILY WITH MEALS [name], MD Taking Active   diclofenac Sodium (VOLTAREN) 1 % GEL [id number] No Apply 2 g topically 4 (four) times daily. [provider] Taking Active   Multiple Vitamin (MULTIVITAMIN WITH MINERALS) TABS tablet [id number] No Take 1 tablet by mouth daily. Centrum Silver . In the morning [provider] Taking Active Family Member  omeprazole (PRILOSEC) 20 MG capsule [id number] No TAKE 1 CAPSULE BY MOUTH ONCE DAILY BEFORE BREAKFAST [place], Amy S, PA-C Taking Active   Polyethyl Glycol-Propyl Glycol 0.4-0.3 % SOLN [id number] No Place 1 drop into both eyes 3 (three) times daily as needed (for dry eyes.). [provider] Taking Active Family Member           Med Note Mayo Ao Jun 01, 2019 70:01 AM)    TRULICITY 7.49 SW/9.6PR [facility] [employer] [id number] No INJECT  0.75 MG SUBCUTANEOUSLY ONCE A WEEK [name], MD Taking Active             Pertinent Labs:   Lab Results  Component Value Date   HGBA1C 6.7 (H) 01/12/2021   Lab Results  Component Value Date   CHOL 75 01/12/2021   HDL 26.60 (L) 01/12/2021   LDLCALC 11 01/12/2021   LDLDIRECT 45.0 02/19/2018   TRIG 186.0 (H) 01/12/2021   CHOLHDL 3 01/12/2021   Lab Results  Component Value Date  CREATININE 1.48 (H) 01/12/2021   BUN 22 01/12/2021   NA 138 01/12/2021   K 4.9 01/20/2021   CL 107 01/12/2021   CO2 23 01/12/2021    SDOH:  (Social Determinants of Health) assessments and interventions performed:  SDOH Interventions    Flowsheet Row Most Recent Value  SDOH Interventions   Financial Strain Interventions Intervention Not Indicated       CCM Care Plan  Review of patient past medical history, allergies, medications, health status, including review of consultants reports, laboratory and other test data, was performed as part of comprehensive evaluation and  provision of chronic care management services.   Care Plan : Medication Management  Updates made by De Hollingshead, RPH-CPP since 04/03/2021 12:00 AM     Problem: Diabetes, HTN, HLD, CKD      Long-Range Goal: Disease Progression Prevention   Start Date: 06/17/2020  This Visit's Progress: On track  Recent Progress: On track  Priority: High  Note:   Current Barriers:  Unable to achieve control of diabetes   Pharmacist Clinical Goal(s):  Over the next 90 days, patient will achieve adherence to monitoring guidelines and medication adherence to achieve therapeutic efficacy through collaboration with PharmD and provider.   Interventions: 1:1 collaboration with [name], MD regarding development and update of comprehensive plan of care as evidenced by provider attestation and co-signature Inter-disciplinary care team collaboration (see longitudinal plan of care) Comprehensive medication review performed; medication list updated in electronic medical record  Health Maintenance   Yearly diabetic eye exam: up to date Yearly diabetic foot exam: up to date Urine microalbumin: up to date Yearly influenza vaccination: due - has not received Td/Tdap vaccination: up to date - added to chart Pneumonia vaccination: up to date COVID vaccinations: up to date - added to chart Shingrix vaccinations: due - encouraged to receive  Colonoscopy: up to date Bone density scan: up to date Mammogram: up to date  Acute Needs: Grandson became symptomatic 11/12, tested positive 11/14 for flu. Patient is asymptomatic, but grandson's pediatrician recommended she talk to her doctor about post exposure prophylaxis. Discussed with doctor of the day today. See telephone note.  Diabetes: Controlled; current treatment: Trulicity 1.69 mg weekly Previously on Tradjenta, d/c when Trulicity started Hx metformin, did not tolerate d/t diarrhea Does note some soft bowel movements in the past week. Thinks  this may be related to eating a lot of fresh turnip greens Denies any issues with low blood sugars  Current glucose readings: checking ~ once monthly; fasting 130s; post prandial ~ 150s Current meal patterns: late breakfast- 1 waffle w/ applesauce; yogurt, mini muffin sometimes; bowl of cereal. Supper ~ 4 pm: pot roast, chicken livers, sometimes fish; sometimes hamburger; sometimes sandwich; vegetables - loves greens; drinks: unsweet tea, mostly water  Current physical activity: walking a lot outside, walks for longer now, about 25 minutes daily  Recommended to continue current regimen at this time.   Hypertension: Controlled per report of home readings; current treatment: carvedilol 6.25 mg BID, amlodipine 10 mg daily Nephrology mentioned consideration for low dose ACEi/ARB in the future. Hx hyperkalemia. Continue to follow any changes.  Current home readings: has not checked in a while Denies hypotensive symptoms, including lightheadedness, dizziness. Denies any falls since our last call.  Wears compression hose to help with occasional edema.  Recommended to continue current regimen at this time  Hyperlipidemia and secondary ASCVD prevention (hx CVA) Controlled per last lipid panel; current treatment: atorvastatin 40 mg daily Current  antiplatelet regimen: aspirin 81 mg daily Recommended to continue current regimen at this time  Osteoporosis: Current treatment: none, bisphosphonate holiday (stopped 2022) Stopped alendronate d/t reports of GERD, dysphagia; per chart review, had been on at least since 2018 -> 2022 Supplementation: Centrum Silver Womens MVI (has Vitamin D 1000 mcg and Calcium 300 mg) daily + calcium 600 mg daily Appropriate to continue bisphosphonate holiday at this time. Consider DEXA in the next few years. If need for treatment resumption moving forward, consider IM/IV options.   GERD/dysphagia: Moderately well controlled at this time per patient report; Current regimen:  omeprazole 20 mg daily after breakfast. Reports issues if she eats acidic foods - had spaghetti yesterday and triggered symptoms Again reviewed to take omeprazole prior to first meal of the day  Patient Goals/Self-Care Activities Over the next 90 days, patient will:  - take medications as prescribed check glucose periodically, document, and provide at future appointments check blood pressure periodically, document, and provide at future appointments Focus on ways to remain connected to other people      Plan: Telephone follow up appointment with care management team member scheduled for:  6 months  [name], PharmD, [place], CPP Clinical Pharmacist [employer] at [employer] [phone]

## 2021-04-03 NOTE — Telephone Encounter (Signed)
During CCM visit, patient inquired about a visit for post exposure prophylaxis for influenza. Reports her grandson (they live together) was first symptomatic on Saturday (11/12) and was diagnosed with influenza today (11/14) and his pediatrician recommended household contacts talk to their providers about post exposure prophylaxis.   Reviewed with her that post exposure prophylaxis is recommend for patients at very high risk of influenza complications but within 48 hours from exposure. Next available appointment is tomorrow and would be over 48 hours. Patient has not had flu vaccine this season. Patient has NO symptoms. Patient does not have capability for MyChart visit today.  Routing to PCP (off today) and doctor of the day to discuss treatment recommendation

## 2021-04-03 NOTE — Patient Instructions (Signed)
Visit Information  Patient Goals/Self-Care Activities Over the next 90 days, patient will:  - take medications as prescribed check glucose periodically, document, and provide at future appointments check blood pressure periodically, document, and provide at future appointments Focus on ways to remain connected to other people  The patient verbalized understanding of instructions, educational materials, and care plan provided today and declined offer to receive copy of patient instructions, educational materials, and care plan.   Plan: Telephone follow up appointment with care management team member scheduled for:  6 months  [name] Feliz Beam, PharmD, [place], CPP Clinical Pharmacist [employer] at [employer] [phone]

## 2021-04-03 NOTE — Telephone Encounter (Signed)
FYI Crystal Miller, Crystal Miller  [name], please call patient.  Please let her know that I went ahead and sent in Tamiflu for exposure to her grandson whom she lives with.  Although she does not have a history of immune compromise, I think very reasonable to start.  Please advise her to keep appointment with [facility] tomorrow.  Certainly if she develops symptoms or becomes flu positive she would need to change her Tamiflu to twice daily dosing.  She may discuss this with Marcelino Duster tomorrow.

## 2021-04-04 ENCOUNTER — Telehealth: Payer: Medicare HMO | Admitting: Adult Health

## 2021-04-04 ENCOUNTER — Encounter: Payer: Self-pay | Admitting: Adult Health

## 2021-04-04 ENCOUNTER — Ambulatory Visit (INDEPENDENT_AMBULATORY_CARE_PROVIDER_SITE_OTHER): Payer: Medicare HMO | Admitting: Adult Health

## 2021-04-04 VITALS — Ht 59.02 in | Wt 147.0 lb

## 2021-04-04 DIAGNOSIS — Z20828 Contact with and (suspected) exposure to other viral communicable diseases: Secondary | ICD-10-CM | POA: Diagnosis not present

## 2021-04-04 NOTE — Patient Instructions (Signed)
Influenza, Adult Influenza, also called "the flu," is a viral infection that mainly affects the respiratory tract. This includes the lungs, nose, and throat. The flu spreads easily from person to person (is contagious). It causes common cold symptoms, along with high fever and body aches. What are the causes? This condition is caused by the influenza virus. You can get the virus by: Breathing in droplets that are in the air from an infected person's cough or sneeze. Touching something that has the virus on it (has been contaminated) and then touching your mouth, nose, or eyes. What increases the risk? The following factors may make you more likely to get the flu: Not washing or sanitizing your hands often. Having close contact with many people during cold and flu season. Touching your mouth, eyes, or nose without first washing or sanitizing your hands. Not getting an annual flu shot. You may have a higher risk for the flu, including serious problems, such as a lung infection (pneumonia), if you: Are older than 65. Are pregnant. Have a weakened disease-fighting system (immune system). This includes people who have HIV or AIDS, are on chemotherapy, or are taking medicines that reduce (suppress) the immune system. Have a long-term (chronic) illness, such as heart disease, kidney disease, diabetes, or lung disease. Have a liver disorder. Are severely overweight (morbidly obese). Have anemia. Have asthma. What are the signs or symptoms? Symptoms of this condition usually begin suddenly and last 4-14 days. These may include: Fever and chills. Headaches, body aches, or muscle aches. Sore throat. Cough. Runny or stuffy (congested) nose. Chest discomfort. Poor appetite. Weakness or fatigue. Dizziness. Nausea or vomiting. How is this diagnosed? This condition may be diagnosed based on: Your symptoms and medical history. A physical exam. Swabbing your nose or throat and testing the fluid  for the influenza virus. How is this treated? If the flu is diagnosed early, you can be treated with antiviral medicine that is given by mouth (orally) or through an IV. This can help reduce how severe the illness is and how long it lasts. Taking care of yourself at home can help relieve symptoms. Your health care provider may recommend: Taking over-the-counter medicines. Drinking plenty of fluids. In many cases, the flu goes away on its own. If you have severe symptoms or complications, you may be treated in a hospital. Follow these instructions at home: Activity Rest as needed and get plenty of sleep. Stay home from work or school as told by your health care provider. Unless you are visiting your health care provider, avoid leaving home until your fever has been gone for 24 hours without taking medicine. Eating and drinking Take an oral rehydration solution (ORS). This is a drink that is sold at pharmacies and retail stores. Drink enough fluid to keep your urine pale yellow. Drink clear fluids in small amounts as you are able. Clear fluids include water, ice chips, fruit juice mixed with water, and low-calorie sports drinks. Eat bland, easy-to-digest foods in small amounts as you are able. These foods include bananas, applesauce, rice, lean meats, toast, and crackers. Avoid drinking fluids that contain a lot of sugar or caffeine, such as energy drinks, regular sports drinks, and soda. Avoid alcohol. Avoid spicy or fatty foods. General instructions   Take over-the-counter and prescription medicines only as told by your health care provider. Use a cool mist humidifier to add humidity to the air in your home. This can make it easier to breathe. When using a cool mist humidifier,   clean it daily. Empty the water and replace it with clean water. Cover your mouth and nose when you cough or sneeze. Wash your hands with soap and water often and for at least 20 seconds, especially after you cough or  sneeze. If soap and water are not available, use alcohol-based hand sanitizer. Keep all follow-up visits. This is important. How is this prevented?  Get an annual flu shot. This is usually available in late summer, fall, or winter. Ask your health care provider when you should get your flu shot. Avoid contact with people who are sick during cold and flu season. This is generally fall and winter. Contact a health care provider if: You develop new symptoms. You have: Chest pain. Diarrhea. A fever. Your cough gets worse. You produce more mucus. You feel nauseous or you vomit. Get help right away if you: Develop shortness of breath or have difficulty breathing. Have skin or nails that turn a bluish color. Have severe pain or stiffness in your neck. Develop a sudden headache or sudden pain in your face or ear. Cannot eat or drink without vomiting. These symptoms may represent a serious problem that is an emergency. Do not wait to see if the symptoms will go away. Get medical help right away. Call your local emergency services (911 in the [place] Do not drive yourself to the hospital. Summary Influenza, also called "the flu," is a viral infection that primarily affects your respiratory tract. Symptoms of the flu usually begin suddenly and last 4-14 days. Getting an annual flu shot is the best way to prevent getting the flu. Stay home from work or school as told by your health care provider. Unless you are visiting your health care provider, avoid leaving home until your fever has been gone for 24 hours without taking medicine. Keep all follow-up visits. This is important. This information is not intended to replace advice given to you by your health care provider. Make sure you discuss any questions you have with your health care provider. Document Revised: 12/25/2019 Document Reviewed: 12/25/2019 Elsevier Patient Education  2022 Elsevier Inc. Oseltamivir Capsules What is this  medication? OSELTAMIVIR (os el TAM i vir) prevents and treats infections caused by the flu virus (influenza). It works by slowing the spread of the flu virus in your body and reducing how long your symptoms last. It will not treat colds or infections caused by bacteria or other viruses. It will not replace the annual flu vaccine. This medicine may be used for other purposes; ask your health care provider or pharmacist if you have questions. COMMON BRAND NAME(S): Tamiflu What should I tell my care team before I take this medication? They need to know if you have any of the following conditions: Difficulty swallowing Kidney disease An unusual or allergic reaction to oseltamivir, other medications, foods, dyes, or preservatives Pregnant or trying to get pregnant Breast-feeding How should I use this medication? Take this medication by mouth with water. Take it as directed on the prescription label at the same time every day. You can take it with or without food. If it upsets your stomach, take it with food. Take all of this medication unless your care team tells you to stop it early. Keep taking it even if you think you are better. When taking whole capsules: Do not cut, crush or chew this medication. Swallow the capsules whole. When mixing capsule contents with liquid: Open the capsule and mix the contents in a small bowl with a small   amount of a sweetened liquid such as chocolate syrup, corn syrup, caramel topping, or light brown sugar (dissolved in water). Stir the mixture and take the dose right away after mixing. Talk to your care team about the use of this medication in children. While it may be prescribed for selected conditions, precautions do apply. Overdosage: If you think you have taken too much of this medicine contact a poison control center or emergency room at once. NOTE: This medicine is only for you. Do not share this medicine with others. What if I miss a dose? If you miss a dose,  take it as soon as you remember. If it is almost time for your next dose (within 2 hours), take only that dose. Do not take double or extra doses. What may interact with this medication? Intranasal influenza vaccine This list may not describe all possible interactions. Give your health care provider a list of all the medicines, herbs, non-prescription drugs, or dietary supplements you use. Also tell them if you smoke, drink alcohol, or use illegal drugs. Some items may interact with your medicine. What should I watch for while using this medication? Visit your care team for regular checks on your progress. Tell your care team if your symptoms do not start to get better or if they get worse. If you have the flu, you may be at an increased risk of developing seizures, confusion, or abnormal behavior. This occurs early in the illness, and more frequently in children and teens. These events are not common, but may result in accidental injury to the patient. Families and caregivers of patients should watch for signs of unusual behavior and contact a care team right away if the patient shows signs of unusual behavior. To treat the flu, start taking this medication within 2 days of getting flu symptoms. This medication is not a substitute for the flu shot. Talk to your care team each year about an annual flu shot. What side effects may I notice from receiving this medication? Side effects that you should report to your care team as soon as possible: Allergic reactions--skin rash, itching, hives, swelling of the face, lips, tongue, or throat Confusion Hallucinations Redness, blistering, peeling, or loosening of the skin, including inside the mouth Seizures Tremors or shaking Trouble speaking Unusual changes in behavior Side effects that usually do not require medical attention (report to your care team if they continue or are bothersome): Headache Nausea Vomiting This list may not describe all  possible side effects. Call your doctor for medical advice about side effects. You may report side effects to FDA at 1-800-FDA-1088. Where should I keep my medication? Keep out of the reach of children and pets. Store at room temperature between 15 and 30 degrees C (59 and 86 degrees F). Throw away any unused medication after the expiration date. NOTE: This sheet is a summary. It may not cover all possible information. If you have questions about this medicine, talk to your doctor, pharmacist, or health care provider.  2022 Elsevier/Gold Standard (2021-01-24 00:00:00)  

## 2021-04-04 NOTE — Progress Notes (Signed)
Virtual Visit via telephone Note  I connected with Crystal Miller on 04/04/21 at  2:30 PM EST by a telephone  enabled telemedicine application and verified that I am speaking with the correct person using two identifiers.  Location: Patient: at home  Provider: Provider: Provider's office at  [facility], [facility] [place].      I discussed the limitations of evaluation and management by telemedicine and the availability of in person appointments. The patient expressed understanding and agreed to proceed.  History of Present Illness: Patient is a 75 year old female in no acute distress she was exposed to flu by grandson this past Saturday  04/01/21 and she received Tamiflu from Sena Hitch NP on 04/03/21 as patient was requesting due to exposure. She was counseled on medication. She reports she feels well today. Patient is currently asymptomatic. She has no concerns or symptoms to report. Denies any side effects with Tamiflu.   Patient  denies any fever, body aches,chills, rash, chest pain, shortness of breath, nausea, vomiting, or diarrhea.     Observations/Objective:   Patient is alert and oriented and responsive to questions Engages in conversation with provider. Speaks in full sentences without any pauses without any shortness of breath or distress.   Assessment and Plan:  Exposure to the flu  She is currently on Tamiflu should continue as prescribed. Schedule office visit or be seen in ED/UC if office closed or any acute distress at anytime.   Follow Up Instructions:   Return if symptoms worsen or fail to improve, for at any time for any worsening symptoms, Go to Emergency room/ urgent care if worse.  I discussed the assessment and treatment plan with the patient. The patient was provided an opportunity to ask questions and all were answered. The patient agreed with the plan and demonstrated an understanding of the instructions.   Red Flags discussed.  The patient was given clear instructions to go to ER or return to medical center if any red flags develop, symptoms do not improve, worsen or new problems develop. They verbalized understanding.  The patient was advised to call back or seek an in-person evaluation if the symptoms worsen or if the condition fails to improve as anticipated.  I provided 15 minutes of non-face-to-face time during this encounter.   Jairo Ben, FNP

## 2021-04-19 DIAGNOSIS — Z87891 Personal history of nicotine dependence: Secondary | ICD-10-CM

## 2021-04-19 DIAGNOSIS — E118 Type 2 diabetes mellitus with unspecified complications: Secondary | ICD-10-CM

## 2021-04-19 DIAGNOSIS — Z7984 Long term (current) use of oral hypoglycemic drugs: Secondary | ICD-10-CM | POA: Diagnosis not present

## 2021-04-19 DIAGNOSIS — I10 Essential (primary) hypertension: Secondary | ICD-10-CM

## 2021-04-19 DIAGNOSIS — E782 Mixed hyperlipidemia: Secondary | ICD-10-CM | POA: Diagnosis not present

## 2021-05-21 ENCOUNTER — Other Ambulatory Visit: Payer: Self-pay | Admitting: Family Medicine

## 2021-05-21 DIAGNOSIS — E782 Mixed hyperlipidemia: Secondary | ICD-10-CM

## 2021-06-01 DIAGNOSIS — N1832 Chronic kidney disease, stage 3b: Secondary | ICD-10-CM | POA: Diagnosis not present

## 2021-06-01 DIAGNOSIS — E1122 Type 2 diabetes mellitus with diabetic chronic kidney disease: Secondary | ICD-10-CM | POA: Diagnosis not present

## 2021-06-01 DIAGNOSIS — R808 Other proteinuria: Secondary | ICD-10-CM | POA: Diagnosis not present

## 2021-06-01 DIAGNOSIS — I10 Essential (primary) hypertension: Secondary | ICD-10-CM | POA: Diagnosis not present

## 2021-06-19 ENCOUNTER — Other Ambulatory Visit: Payer: Self-pay | Admitting: Family Medicine

## 2021-06-19 DIAGNOSIS — E118 Type 2 diabetes mellitus with unspecified complications: Secondary | ICD-10-CM

## 2021-06-28 ENCOUNTER — Other Ambulatory Visit: Payer: Self-pay | Admitting: Family Medicine

## 2021-07-17 ENCOUNTER — Other Ambulatory Visit: Payer: Self-pay

## 2021-07-17 ENCOUNTER — Ambulatory Visit (INDEPENDENT_AMBULATORY_CARE_PROVIDER_SITE_OTHER): Payer: Medicare HMO | Admitting: Family Medicine

## 2021-07-17 ENCOUNTER — Encounter: Payer: Self-pay | Admitting: Family Medicine

## 2021-07-17 VITALS — BP 118/80 | HR 70 | Temp 98.2°F | Ht 59.0 in | Wt 146.4 lb

## 2021-07-17 DIAGNOSIS — I10 Essential (primary) hypertension: Secondary | ICD-10-CM | POA: Diagnosis not present

## 2021-07-17 DIAGNOSIS — I739 Peripheral vascular disease, unspecified: Secondary | ICD-10-CM

## 2021-07-17 DIAGNOSIS — K219 Gastro-esophageal reflux disease without esophagitis: Secondary | ICD-10-CM

## 2021-07-17 DIAGNOSIS — Z23 Encounter for immunization: Secondary | ICD-10-CM | POA: Diagnosis not present

## 2021-07-17 DIAGNOSIS — E782 Mixed hyperlipidemia: Secondary | ICD-10-CM

## 2021-07-17 DIAGNOSIS — E118 Type 2 diabetes mellitus with unspecified complications: Secondary | ICD-10-CM | POA: Diagnosis not present

## 2021-07-17 LAB — POCT GLYCOSYLATED HEMOGLOBIN (HGB A1C): Hemoglobin A1C: 6.3 % — AB (ref 4.0–5.6)

## 2021-07-17 NOTE — Assessment & Plan Note (Signed)
Continue risk factor management. 

## 2021-07-17 NOTE — Progress Notes (Signed)
Tommi Rumps, MD Phone: [phone]  Crystal Miller is a 76 y.o. adult who presents today for f/u.  HYPERTENSION Disease Monitoring: Blood pressure [id number] systolic Chest pain- no      Dyspnea- no Medications: Compliance- taking amlodipine, coreg   Edema- no  DIABETES Disease Monitoring: Blood Sugar ranges-120-130 Polyuria/phagia/dipsia- no      Optho- UTD Medications: Compliance- taking trulicity Hypoglycemic symptoms- no Walking some for exercise. Avoids fried foods. Does eat some sweets.   HYPERLIPIDEMIA Disease Monitoring: See symptoms for Hypertension Medications: Compliance- lipitor Right upper quadrant pain- no  Muscle aches- no  GERD:   Reflux symptoms: rare if she eats the qrong food or too late   Abd pain: no    Dysphagia: no   EGD: no, though did see GI and had a barium swallow, they advised against an EGD and offered her speech therapy referral though the patient declined  Medication: omeprazole   Social History   Tobacco Use  Smoking Status Former   Packs/day: 1.00   Years: 20.00   Pack years: 20.00   Types: Cigarettes   Quit date: 2012   Years since quitting: 11.1  Smokeless Tobacco Never    Current Outpatient Medications on File Prior to Visit  Medication Sig Dispense Refill   acetaminophen (TYLENOL) 500 MG tablet Take 1,000 mg by mouth every 6 (six) hours as needed (for pain.).     amLODipine (NORVASC) 10 MG tablet Take 1 tablet by mouth once daily 90 tablet 0   aspirin EC 81 MG EC tablet Take 1 tablet (81 mg total) by mouth daily. 30 tablet 2   atorvastatin (LIPITOR) 40 MG tablet TAKE 1 TABLET BY MOUTH ONCE DAILY AT  6PM 90 tablet 3   blood glucose meter kit and supplies KIT Dispense based on patient and insurance preference. Use up to four times daily as directed. (FOR ICD-9 250.00, 250.01). 1 each 0   calcium carbonate (OS-CAL) 600 MG tablet Take 600 mg by mouth daily.     carvedilol (COREG) 6.25 MG tablet TAKE 1 TABLET BY MOUTH TWICE  DAILY WITH MEALS 180 tablet 1   diclofenac Sodium (VOLTAREN) 1 % GEL Apply 2 g topically 4 (four) times daily.     Multiple Vitamin (MULTIVITAMIN WITH MINERALS) TABS tablet Take 1 tablet by mouth daily. Centrum Silver . In the morning     omeprazole (PRILOSEC) 20 MG capsule TAKE 1 CAPSULE BY MOUTH ONCE DAILY BEFORE BREAKFAST 30 capsule 5   oseltamivir (TAMIFLU) 75 MG capsule Take 1 capsule (75 mg total) by mouth daily. 10 capsule 0   Polyethyl Glycol-Propyl Glycol 0.4-0.3 % SOLN Place 1 drop into both eyes 3 (three) times daily as needed (for dry eyes.).     TRULICITY 6.28 ZM/6.2HU SOPN INJECT 1 DOSE (0.75MG) SUBCUTANEOUSLY ONCE A WEEK 12 mL 0   No current facility-administered medications on file prior to visit.     ROS see history of present illness  Objective  Physical Exam Vitals:   07/17/21 1326  BP: 118/80  Pulse: 70  Temp: 98.2 F (36.8 C)  SpO2: 98%    BP Readings from Last 3 Encounters:  07/17/21 118/80  01/12/21 120/60  12/15/20 140/62   Wt Readings from Last 3 Encounters:  07/17/21 146 lb 6.4 oz (66.4 kg)  04/04/21 147 lb (66.7 kg)  01/12/21 145 lb 3.2 oz (65.9 kg)    Physical Exam Constitutional:      General: He is not in acute distress.    Appearance:  He is not diaphoretic.  Cardiovascular:     Rate and Rhythm: Normal rate and regular rhythm.     Heart sounds: Normal heart sounds.  Pulmonary:     Effort: Pulmonary effort is normal.     Breath sounds: Normal breath sounds.  Skin:    General: Skin is warm and dry.  Neurological:     Mental Status: He is alert.     Assessment/Plan: Please see individual problem list.  Problem List Items Addressed This Visit     DM (diabetes mellitus), type 2 with complications ([place])    Well controlled. She will continue trulicity 6.96 mg daily. Check urine microalbumin.       Relevant Orders   POCT HgB A1C (Completed)   Urine Microalbumin w/creat. ratio   Essential hypertension - Primary    Well controlled.  She will continue amlodipine 10 mg daily and coreg 6.25 mg BID.       GERD (gastroesophageal reflux disease)    Well controlled. She has been evaluated by GI. She will continue omeprazole 20 mg daily.       Mixed hyperlipidemia    Adequate control on previous lipid panel. Continue lipitor 40 mg daily.       PAD (peripheral artery disease) ([place])    Continue risk factor management.       Other Visit Diagnoses     Need for immunization against influenza       Relevant Orders   Flu Vaccine QUAD High Dose(Fluad) (Completed)        Return in about 6 months (around 01/14/2022).  This visit occurred during the SARS-CoV-2 public health emergency.  Safety protocols were in place, including screening questions prior to the visit, additional usage of staff PPE, and extensive cleaning of exam room while observing appropriate contact time as indicated for disinfecting solutions.    Tommi Rumps, MD [place]

## 2021-07-17 NOTE — Assessment & Plan Note (Signed)
Well controlled. She will continue trulicity 0.75 mg daily. Check urine microalbumin.

## 2021-07-17 NOTE — Assessment & Plan Note (Signed)
Well controlled. She will continue amlodipine 10 mg daily and coreg 6.25 mg BID.

## 2021-07-17 NOTE — Patient Instructions (Signed)
Nice to see you.  Please remain active. Please monitor your diet as you have been.

## 2021-07-17 NOTE — Assessment & Plan Note (Signed)
Adequate control on previous lipid panel. Continue lipitor 40 mg daily.

## 2021-07-17 NOTE — Assessment & Plan Note (Signed)
Well controlled. She has been evaluated by GI. She will continue omeprazole 20 mg daily.

## 2021-07-18 LAB — MICROALBUMIN / CREATININE URINE RATIO
Creatinine,U: 60 mg/dL
Microalb Creat Ratio: 59.7 mg/g — ABNORMAL HIGH (ref 0.0–30.0)
Microalb, Ur: 35.8 mg/dL — ABNORMAL HIGH (ref 0.0–1.9)

## 2021-07-19 ENCOUNTER — Telehealth: Payer: Self-pay

## 2021-07-19 NOTE — Telephone Encounter (Signed)
Lvm for pt to return call in regards to labs.  ?

## 2021-07-25 ENCOUNTER — Ambulatory Visit (INDEPENDENT_AMBULATORY_CARE_PROVIDER_SITE_OTHER): Payer: Medicare HMO | Admitting: Family Medicine

## 2021-07-25 ENCOUNTER — Encounter: Payer: Self-pay | Admitting: Family Medicine

## 2021-07-25 ENCOUNTER — Ambulatory Visit: Payer: Self-pay | Admitting: Pharmacist

## 2021-07-25 ENCOUNTER — Ambulatory Visit (INDEPENDENT_AMBULATORY_CARE_PROVIDER_SITE_OTHER): Payer: Medicare HMO

## 2021-07-25 ENCOUNTER — Other Ambulatory Visit: Payer: Self-pay

## 2021-07-25 VITALS — BP 140/70 | HR 76 | Temp 98.5°F | Ht 59.0 in | Wt 147.8 lb

## 2021-07-25 DIAGNOSIS — S91312A Laceration without foreign body, left foot, initial encounter: Secondary | ICD-10-CM

## 2021-07-25 DIAGNOSIS — B351 Tinea unguium: Secondary | ICD-10-CM

## 2021-07-25 DIAGNOSIS — S91319A Laceration without foreign body, unspecified foot, initial encounter: Secondary | ICD-10-CM | POA: Insufficient documentation

## 2021-07-25 DIAGNOSIS — M7732 Calcaneal spur, left foot: Secondary | ICD-10-CM | POA: Diagnosis not present

## 2021-07-25 NOTE — Chronic Care Management (AMB) (Signed)
?  Chronic Care Management  ? ?Note ? ?07/25/2021 ?Name: Crystal Miller MRN: [id number] DOB: [birth year] ? ? ? ?Closing pharmacy CCM case at this time.  Patient has clinic contact information for future questions or concerns.  ? ?[name] Feliz Beam, PharmD, [place], CPP ?Clinical Pharmacist ?Nature conservation officer at [employer] ?[phone] ? ?

## 2021-07-25 NOTE — Assessment & Plan Note (Signed)
Refer to podiatry

## 2021-07-25 NOTE — Progress Notes (Signed)
?Tommi Rumps, MD ?Phone: [phone] ? ?Crystal Miller is a 76 y.o. adult who presents today for same-day visit. ? ?Left foot injury: Patient notes around a hotel several days ago and she ended up stepping on some glass.  She has a cut on the bottom of her left foot near her first MTP joint.  She did not feel a cut.  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  She notes no pain.  They get the bleeding stopped within about 20 minutes.  It has not bled since then.  She has no pain at this time. ? ?Social History  ? ?Tobacco Use  ?Smoking Status Former  ? Packs/day: 1.00  ? Years: 20.00  ? Pack years: 20.00  ? Types: Cigarettes  ? Quit date: 2012  ? Years since quitting: 11.1  ?Smokeless Tobacco Never  ? ? ?Current Outpatient Medications on File Prior to Visit  ?Medication Sig Dispense Refill  ? acetaminophen (TYLENOL) 500 MG tablet Take 1,000 mg by mouth every 6 (six) hours as needed (for pain.).    ? amLODipine (NORVASC) 10 MG tablet Take 1 tablet by mouth once daily 90 tablet 0  ? aspirin EC 81 MG EC tablet Take 1 tablet (81 mg total) by mouth daily. 30 tablet 2  ? atorvastatin (LIPITOR) 40 MG tablet TAKE 1 TABLET BY MOUTH ONCE DAILY AT  6PM 90 tablet 3  ? blood glucose meter kit and supplies KIT Dispense based on patient and insurance preference. Use up to four times daily as directed. (FOR ICD-9 250.00, 250.01). 1 each 0  ? calcium carbonate (OS-CAL) 600 MG tablet Take 600 mg by mouth daily.    ? carvedilol (COREG) 6.25 MG tablet TAKE 1 TABLET BY MOUTH TWICE DAILY WITH MEALS 180 tablet 1  ? diclofenac Sodium (VOLTAREN) 1 % GEL Apply 2 g topically 4 (four) times daily.    ? Multiple Vitamin (MULTIVITAMIN WITH MINERALS) TABS tablet Take 1 tablet by mouth daily. Centrum Silver . In the morning    ? omeprazole (PRILOSEC) 20 MG capsule TAKE 1 CAPSULE BY MOUTH ONCE DAILY BEFORE BREAKFAST 30 capsule 5  ? oseltamivir (TAMIFLU) 75 MG capsule Take 1 capsule (75 mg total) by mouth daily. 10  capsule 0  ? Polyethyl Glycol-Propyl Glycol 0.4-0.3 % SOLN Place 1 drop into both eyes 3 (three) times daily as needed (for dry eyes.).    ? TRULICITY 3.61 WE/3.1VQ SOPN INJECT 1 DOSE (0.75MG) SUBCUTANEOUSLY ONCE A WEEK 12 mL 0  ? ?No current facility-administered medications on file prior to visit.  ? ? ? ?ROS see history of present illness ? ?Objective ? ?Physical Exam ?Vitals:  ? 07/25/21 1240  ?BP: 140/70  ?Pulse: 76  ?Temp: 98.5 ?F (36.9 ?C)  ?SpO2: 97%  ? ? ?BP Readings from Last 3 Encounters:  ?07/25/21 140/70  ?07/17/21 118/80  ?01/12/21 120/60  ? ?Wt Readings from Last 3 Encounters:  ?07/25/21 147 lb 12.8 oz (67 kg)  ?07/17/21 146 lb 6.4 oz (66.4 kg)  ?04/04/21 147 lb (66.7 kg)  ? ? ?Physical Exam ? ?Nontender, no visible glass noted on evaluation of the wound, the wound is very shallow ?Onychomycosis noted bilateral toenails ? ?Assessment/Plan: Please see individual problem list. ? ?Problem List Items Addressed This Visit   ? ? Foot laceration - Primary  ?  Patient has a superficial appearing left foot laceration.  There is no apparent foreign body though we will get an x-ray  today to evaluate further.  Discussed cleansing with soap and water.  They report they need copies of everything to submit to the hotel and this will be provided after the x-ray report returns.  She will monitor for signs of infection and if those occur she will let us know.  If there is any delayed healing she will also let us know. ?  ?  ? Relevant Orders  ? DG Foot Complete Left  ? Onychomycosis  ?  Refer to podiatry. ?  ?  ? Relevant Orders  ? Ambulatory referral to Podiatry  ? ? ? ?Return if symptoms worsen or fail to improve. ? ?This visit occurred during the SARS-CoV-2 public health emergency.  Safety protocols were in place, including screening questions prior to the visit, additional usage of staff PPE, and extensive cleaning of exam room while observing appropriate contact time as indicated for disinfecting solutions.   ? ? ?Tommi Rumps, MD ?[place] ? ?

## 2021-07-25 NOTE — Patient Instructions (Signed)
Nice to see you. ?If you develop any signs of infection such as spreading redness, increasing pain, drainage of pus, or fevers please let us know immediately. ?If the wound is not progressively healing please let us know as well. ?Please continue to cleanse the area with soap and water.  You can keep it bandaged while wearing your shoes.  Please do not walk around in your bare feet. ?

## 2021-07-25 NOTE — Assessment & Plan Note (Signed)
Patient has a superficial appearing left foot laceration.  There is no apparent foreign body though we will get an x-ray today to evaluate further.  Discussed cleansing with soap and water.  They report they need copies of everything to submit to the hotel and this will be provided after the x-ray report returns.  She will monitor for signs of infection and if those occur she will let us know.  If there is any delayed healing she will also let us know. ?

## 2021-07-28 ENCOUNTER — Telehealth: Payer: Self-pay | Admitting: Family Medicine

## 2021-07-28 NOTE — Telephone Encounter (Signed)
Patient called in for xray results. Results were read to patient and patient will be coming in to pick up CD and notes. ?

## 2021-08-08 ENCOUNTER — Ambulatory Visit: Payer: Medicare HMO | Admitting: Podiatry

## 2021-08-08 ENCOUNTER — Other Ambulatory Visit: Payer: Self-pay

## 2021-08-08 ENCOUNTER — Encounter: Payer: Self-pay | Admitting: Podiatry

## 2021-08-08 DIAGNOSIS — M79674 Pain in right toe(s): Secondary | ICD-10-CM

## 2021-08-08 DIAGNOSIS — E118 Type 2 diabetes mellitus with unspecified complications: Secondary | ICD-10-CM | POA: Diagnosis not present

## 2021-08-08 DIAGNOSIS — M79675 Pain in left toe(s): Secondary | ICD-10-CM

## 2021-08-08 DIAGNOSIS — B351 Tinea unguium: Secondary | ICD-10-CM

## 2021-08-10 NOTE — Progress Notes (Signed)
?  Subjective:  ?Patient ID: Crystal Miller, adult    DOB: 09-04-1945,  MRN: [id number] ? ?Chief Complaint  ?Patient presents with  ? Nail Problem  ?  "I'm Diabetic.  Clip my toenails, they're terrible."  ? ?76 y.o. adult returns for the above complaint.  Patient presents with thickened elongated dystrophic toenails x10.  Mild pain on palpation.  She would like to have them debrided down she is not able to do it herself.  She is a diabetic with last A1c of 6.3 ? ?Objective:  ?There were no vitals filed for this visit. ?Podiatric Exam: ?Vascular: dorsalis pedis and posterior tibial pulses are palpable bilateral. Capillary return is immediate. Temperature gradient is WNL. Skin turgor WNL  ?Sensorium: Normal Semmes Weinstein monofilament test. Normal tactile sensation bilaterally. ?Nail Exam: Pt has thick disfigured discolored nails with subungual debris noted bilateral entire nail hallux through fifth toenails.  Pain on palpation to the nails. ?Ulcer Exam: There is no evidence of ulcer or pre-ulcerative changes or infection. ?Orthopedic Exam: Muscle tone and strength are WNL. No limitations in general ROM. No crepitus or effusions noted.  ?Skin: No Porokeratosis. No infection or ulcers ? ? ? ?Assessment & Plan:  ? ?1. Pain due to onychomycosis of toenails of both feet   ?2. DM (diabetes mellitus), type 2 with complications (HCC)   ? ? ?Patient was evaluated and treated and all questions answered. ? ?Onychomycosis with pain  ?-Nails palliatively debrided as below. ?-Educated on self-care ? ?Procedure: Nail Debridement ?Rationale: pain  ?Type of Debridement: manual, sharp debridement. ?Instrumentation: Nail nipper, rotary burr. ?Number of Nails: 10 ? ?Procedures and Treatment: Consent by patient was obtained for treatment procedures. The patient understood the discussion of treatment and procedures well. All questions were answered thoroughly reviewed. Debridement of mycotic and hypertrophic toenails, 1 through 5 bilateral  and clearing of subungual debris. No ulceration, no infection noted.  ?Return Visit-Office Procedure: Patient instructed to return to the office for a follow up visit 3 months for continued evaluation and treatment. ? ?Nicholes Rough, DPM ?  ? ?No follow-ups on file. ? ?

## 2021-09-11 ENCOUNTER — Other Ambulatory Visit: Payer: Self-pay | Admitting: Family Medicine

## 2021-09-11 ENCOUNTER — Other Ambulatory Visit: Payer: Self-pay | Admitting: Family

## 2021-09-11 DIAGNOSIS — E118 Type 2 diabetes mellitus with unspecified complications: Secondary | ICD-10-CM

## 2021-09-12 ENCOUNTER — Telehealth: Payer: Medicare HMO

## 2021-09-27 ENCOUNTER — Other Ambulatory Visit: Payer: Self-pay | Admitting: Physician Assistant

## 2021-09-27 ENCOUNTER — Other Ambulatory Visit: Payer: Self-pay | Admitting: Family Medicine

## 2021-09-27 ENCOUNTER — Telehealth: Payer: Self-pay | Admitting: Family

## 2021-10-02 ENCOUNTER — Other Ambulatory Visit: Payer: Self-pay

## 2021-10-02 DIAGNOSIS — I10 Essential (primary) hypertension: Secondary | ICD-10-CM

## 2021-10-02 MED ORDER — AMLODIPINE BESYLATE 10 MG PO TABS: 10.0000 mg | ORAL_TABLET | Freq: Every day | ORAL | 1 refills | Status: DC

## 2021-10-02 NOTE — Telephone Encounter (Signed)
Pt called in requesting refill on medication (amLODipine (NORVASC) 10 MG tablet).... Pt requesting callback....  ?

## 2021-11-09 ENCOUNTER — Ambulatory Visit: Payer: Medicare HMO | Admitting: Podiatry

## 2021-11-28 ENCOUNTER — Other Ambulatory Visit: Payer: Self-pay | Admitting: Family Medicine

## 2021-11-28 DIAGNOSIS — E118 Type 2 diabetes mellitus with unspecified complications: Secondary | ICD-10-CM

## 2021-11-30 ENCOUNTER — Ambulatory Visit (INDEPENDENT_AMBULATORY_CARE_PROVIDER_SITE_OTHER): Payer: Medicare HMO

## 2021-11-30 VITALS — Ht 59.0 in | Wt 147.0 lb

## 2021-11-30 DIAGNOSIS — Z0000 Encounter for general adult medical examination without abnormal findings: Secondary | ICD-10-CM | POA: Diagnosis not present

## 2021-11-30 NOTE — Progress Notes (Signed)
Subjective:   Crystal Miller is a 76 y.o. female who presents for Medicare Annual (Subsequent) preventive examination.  Review of Systems    No ROS.  Medicare Wellness Virtual Visit.  Visual/audio telehealth visit, UTA vital signs.   See social history for additional risk factors.   Cardiac Risk Factors include: advanced age (>23mn, >>4women);hypertension;diabetes mellitus     Objective:    Today's Vitals   11/30/21 1339  Weight: 147 lb (66.7 kg)  Height: '4\' 11"'  (1.499 m)   Body mass index is 29.69 kg/m.     11/30/2021    2:06 PM 11/29/2020    3:00 PM 11/24/2019   12:04 PM 11/20/2018   11:50 AM 08/01/2018    5:00 PM 08/01/2018   11:46 AM 12/31/2017    7:37 AM  Advanced Directives  Does Patient Have a Medical Advance Directive? Yes Yes Yes Yes Yes Yes No  Type of Advance Directive Out of facility DNR (pink MOST or yellow form) [place] Out of facility D[place] (pink MOST or yellow form) Out of facility DNR (pink MOST or yellow form) Out of facility DNR (pink MOST or yellow form) Out of facility DNR (pink MOST or yellow form)   Does patient want to make changes to medical advance directive? No - Patient declined No - Patient declined No - Patient declined No - Patient declined No - Patient declined No - Patient declined   Copy of HImpactin Chart?  Yes - validated most recent copy scanned in chart (See row information)       Would patient like information on creating a medical advance directive?       No - Patient declined  Pre-existing out of facility DNR order (yellow form or pink MOST form)     Yellow form placed in chart (order not valid for inpatient use) Yellow form placed in chart (order not valid for inpatient use)     Current Medications (verified) Outpatient Encounter Medications as of 11/30/2021  Medication Sig   acetaminophen (TYLENOL) 500 MG tablet Take 1,000 mg by mouth every 6 (six) hours as needed (for pain.).    amLODipine (NORVASC) 10 MG tablet Take 1 tablet (10 mg total) by mouth daily.   aspirin EC 81 MG EC tablet Take 1 tablet (81 mg total) by mouth daily.   atorvastatin (LIPITOR) 40 MG tablet TAKE 1 TABLET BY MOUTH ONCE DAILY AT  6PM   blood glucose meter kit and supplies KIT Dispense based on patient and insurance preference. Use up to four times daily as directed. (FOR ICD-9 250.00, 250.01).   calcium carbonate (OS-CAL) 600 MG tablet Take 600 mg by mouth daily.   carvedilol (COREG) 6.25 MG tablet TAKE 1 TABLET BY MOUTH TWICE DAILY WITH MEALS   diclofenac Sodium (VOLTAREN) 1 % GEL Apply 2 g topically 4 (four) times daily.   Multiple Vitamin (MULTIVITAMIN WITH MINERALS) TABS tablet Take 1 tablet by mouth daily. Centrum Silver . In the morning   omeprazole (PRILOSEC) 20 MG capsule TAKE 1 CAPSULE BY MOUTH ONCE DAILY BEFORE BREAKFAST   oseltamivir (TAMIFLU) 75 MG capsule Take 1 capsule (75 mg total) by mouth daily. (Patient not taking: Reported on 08/08/2021)   Polyethyl Glycol-Propyl Glycol 0.4-0.3 % SOLN Place 1 drop into both eyes 3 (three) times daily as needed (for dry eyes.).   TRULICITY 08.78MMV/6.7MCSOPN INJECT ONE SYRINGEFUL SUBCUTANEOUSLY ONCE WEEKLY   No facility-administered encounter medications on file as of  11/30/2021.    Allergies (verified) Metformin and related   History: Past Medical History:  Diagnosis Date   Acute pyelonephritis 2018-08-05   Depression    husband died in 2015-07-28   DM (diabetes mellitus), type 2 with complications ([place]) 54/65/0354   Essential hypertension 11/01/2016   GERD (gastroesophageal reflux disease)    Kidney stones    Mixed hyperlipidemia 11/01/2016   Peripheral vascular disease ([place])    Renal insufficiency    Right pontine stroke ([place]) 03/27/2017   Stroke ([place]) 2016-07-27   slight residual with speech. does not drive Lt side weakness   Past Surgical History:  Procedure Laterality Date   KIDNEY STONE SURGERY     LOWER EXTREMITY ANGIOGRAPHY Left  12/31/2017   Procedure: LOWER EXTREMITY ANGIOGRAPHY;  Surgeon: Katha Cabal, MD;  Location: [facility];  Service: Cardiovascular;  Laterality: Left;   LOWER EXTREMITY ANGIOGRAPHY Right 04/22/2018   Procedure: LOWER EXTREMITY ANGIOGRAPHY;  Surgeon: Katha Cabal, MD;  Location: [facility];  Service: Cardiovascular;  Laterality: Right;   Family History  Problem Relation Age of Onset   Alzheimer's disease Mother    Arthritis Mother    Hypertension Mother    CVA Father    CAD Father    Heart disease Father    Stroke Father    Hypertension Father    Arthritis Sister    Stroke Brother    Diabetes Brother    Heart disease Brother    Bone cancer Brother    Lung cancer Brother    Diabetes Son    Breast cancer Neg Hx    Social History   Socioeconomic History   Marital status: Widowed    Spouse name: Not on file   Number of children: 3   Years of education: Not on file   Highest education level: Not on file  Occupational History   Occupation: Scientist, water quality    Comment: retired   Occupation: retired  Tobacco Use   Smoking status: Former    Packs/day: 1.00    Years: 20.00    Total pack years: 20.00    Types: Cigarettes    Quit date: 2010-07-28    Years since quitting: 11.5   Smokeless tobacco: Never  Vaping Use   Vaping Use: Never used  Substance and Sexual Activity   Alcohol use: No   Drug use: No   Sexual activity: Not Currently    Birth control/protection: Post-menopausal  Other Topics Concern   Not on file  Social History Narrative   Not on file   Social Determinants of Health   Financial Resource Strain: Low Risk  (11/30/2021)   Overall Financial Resource Strain (CARDIA)    Difficulty of Paying Living Expenses: Not hard at all  Food Insecurity: No Food Insecurity (11/30/2021)   Hunger Vital Sign    Worried About Running Out of Food in the Last Year: Never true    [place] in the Last Year: Never true  Transportation Needs:  No Transportation Needs (11/30/2021)   PRAPARE - Hydrologist (Medical): No    Lack of Transportation (Non-Medical): No  Physical Activity: Sufficiently Active (11/30/2021)   Exercise Vital Sign    Days of Exercise per Week: 5 days    Minutes of Exercise per Session: 30 min  Stress: No Stress Concern Present (11/30/2021)   [place]    Feeling of Stress :  Not at all  Social Connections: Moderately Integrated (11/30/2021)   Social Connection and Isolation Panel [NHANES]    Frequency of Communication with Friends and Family: More than three times a week    Frequency of Social Gatherings with Friends and Family: More than three times a week    Attends Religious Services: More than 4 times per year    Active Member of [employer] or Organizations: Yes    Attends Archivist Meetings: 1 to 4 times per year    Marital Status: Widowed   Tobacco Counseling Counseling given: Not Answered  Clinical Intake: Pre-visit preparation completed: Yes       Diabetes: Yes  How often do you need to have someone help you when you read instructions, pamphlets, or other written materials from your doctor or pharmacy?: 1 - Never   Interpreter Needed?: No    Activities of Daily Living    11/30/2021    1:40 PM  In your present state of health, do you have any difficulty performing the following activities:  Hearing? 0  Vision? 0  Difficulty concentrating or making decisions? 0  Walking or climbing stairs? 1  Comment Walker in use  Dressing or bathing? 0  Doing errands, shopping? 1  Comment Family or [employer] planner and eating ? N  Using the Toilet? N  In the past six months, have you accidently leaked urine? N  Do you have problems with loss of bowel control? N  Managing your Medications? N  Managing your Finances? N  Housekeeping or managing your Housekeeping? N   Patient  Care Team: [name], MD as PCP - General (Family Medicine)  Indicate any recent Medical Services you may have received from other than [facility] providers in the past year (date may be approximate).     Assessment:   This is a routine wellness examination for [place].  Virtual Visit via Telephone Note  I connected with  Crystal Miller on 11/30/21 at  1:30 PM EDT by telephone and verified that I am speaking with the correct person using two identifiers.  Persons participating in the virtual visit: patient/Nurse Health Advisor   I discussed the limitations of performing an evaluation and management service by telehealth. We continued and completed visit with audio only. Some vital signs may be absent or patient reported.   Hearing/Vision screen Hearing Screening - Comments:: Patient is able to hear conversational tones without difficulty. No issues reported. Vision Screening - Comments:: Followed by [facility] Wears corrective lenses No retinopathy reported  They have seen their ophthalmologist in the last 12 months.   Dietary issues and exercise activities discussed: Current Exercise Habits: Home exercise routine, Type of exercise: walking, Time (Minutes): 30, Frequency (Times/Week): 5, Weekly Exercise (Minutes/Week): 150, Intensity: Mild Healthy diet    Goals Addressed             This Visit's Progress    Maintain Healthy Lifestyle       Walk for exercise Low carb diet Stay hydrated       Depression Screen    11/30/2021    1:44 PM 07/17/2021    1:27 PM 11/29/2020    2:58 PM 08/29/2020    1:35 PM 06/17/2020   11:55 AM 01/27/2020   11:02 AM 01/27/2020   10:45 AM  PHQ 2/9 Scores  PHQ - 2 Score 0 0 0 0 2 2 0  PHQ- 9 Score     3 9  Fall Risk    11/30/2021    1:44 PM 07/17/2021    1:27 PM 11/29/2020    3:02 PM 08/29/2020    1:35 PM 01/27/2020   10:44 AM  Fall Risk   Falls in the past year? 0 0 0 0 0  Number falls in past yr: 0 0 0 0 0  Injury with Fall?   0 0  0  Risk for fall due to :  No Fall Risks     Follow up Falls evaluation completed Falls evaluation completed Falls evaluation completed Falls evaluation completed Falls evaluation completed    [place]: Home free of loose throw rugs in walkways, pet beds, electrical cords, etc? Yes  Adequate lighting in your home to reduce risk of falls? Yes   ASSISTIVE DEVICES UTILIZED TO PREVENT FALLS: Use of a cane, walker or w/c? Yes   TIMED UP AND GO: Was the test performed? No .   Cognitive Function:  Patient is alert and oriented x3.       11/30/2021    2:08 PM 11/24/2019   11:45 AM 11/20/2018   11:54 AM 10/30/2017   11:39 AM  6CIT Screen  What Year? 0 points 0 points 0 points 0 points  What month? 0 points 0 points 0 points 0 points  What time? 0 points  0 points 0 points  Count back from 20 0 points 0 points 0 points 0 points  Months in reverse 0 points 0 points 0 points 0 points  Repeat phrase 0 points 0 points 0 points 0 points  Total Score 0 points  0 points 0 points    Immunizations Immunization History  Administered Date(s) Administered   Fluad Quad(high Dose 65+) 02/26/2019, 01/27/2020, 07/17/2021   Influenza, High Dose Seasonal PF 02/19/2018   PFIZER(Purple Top)SARS-COV-2 Vaccination 07/11/2019, 08/04/2019, 03/22/2020   Pfizer Covid-19 Vaccine Bivalent Booster 32yr & up 02/02/2021   Pneumococcal Conjugate-13 10/30/2017   Pneumococcal Polysaccharide-23 10/02/2016   Tdap 02/02/2021   TDAP status: Due, Education has been provided regarding the importance of this vaccine. Advised may receive this vaccine at local pharmacy or Health Dept. Aware to provide a copy of the vaccination record if obtained from local pharmacy or Health Dept. Verbalized acceptance and understanding.  Shingrix Completed?: No.    Education has been provided regarding the importance of this vaccine. Patient has been advised to call insurance company to determine out of  pocket expense if they have not yet received this vaccine. Advised may also receive vaccine at local pharmacy or Health Dept. Verbalized acceptance and understanding.  Screening Tests Health Maintenance  Topic Date Due   FOOT EXAM  08/29/2021   COVID-19 Vaccine (5 - Pfizer series) 12/16/2021 (Originally 06/04/2021)   Zoster Vaccines- Shingrix (1 of 2) 03/02/2022 (Originally 07/21/1994)   INFLUENZA VACCINE  12/19/2021   HEMOGLOBIN A1C  01/14/2022   OPHTHALMOLOGY EXAM  02/03/2022   URINE MICROALBUMIN  07/17/2022   TETANUS/TDAP  02/03/2031   Pneumonia Vaccine 76 Years old  Completed   DEXA SCAN  Completed   HPV VACCINES  Aged Out   Fecal DNA (Cologuard)  Discontinued   Health Maintenance Health Maintenance Due  Topic Date Due   FOOT EXAM  08/29/2021   Lung Cancer Screening: (Low Dose CT Chest recommended if Age 77-80years, 30 pack-year currently smoking OR have quit w/in 15years.) does not qualify.   Vision Screening: Recommended annual ophthalmology exams for early detection of glaucoma and other  disorders of the eye.  Dental Screening: Recommended annual dental exams for proper oral hygiene  Community Resource Referral / Chronic Care Management: CRR required this visit?  No   CCM required this visit?  No      Plan:   Keep all routine maintenance appointments.   I have personally reviewed and noted the following in the patient's chart:   Medical and social history Use of alcohol, tobacco or illicit drugs  Current medications and supplements including opioid prescriptions.  Functional ability and status Nutritional status Physical activity Advanced directives List of other physicians Hospitalizations, surgeries, and ER visits in previous 12 months Vitals Screenings to include cognitive, depression, and falls Referrals and appointments  In addition, I have reviewed and discussed with patient certain preventive protocols, quality metrics, and best practice  recommendations. A written personalized care plan for preventive services as well as general preventive health recommendations were provided to patient.     Varney Biles, LPN   11/29/1974

## 2021-11-30 NOTE — Patient Instructions (Addendum)
  Mr. Crystal Miller , Thank you for taking time to come for your Medicare Wellness Visit. I appreciate your ongoing commitment to your health goals. Please review the following plan we discussed and let me know if I can assist you in the future.   These are the goals we discussed:  Goals      Maintain Healthy Lifestyle     Walk for exercise Low carb diet Stay hydrated        This is a list of the screening recommended for you and due dates:  Health Maintenance  Topic Date Due   Complete foot exam   08/29/2021   COVID-19 Vaccine (5 - Pfizer series) 12/16/2021*   Zoster (Shingles) Vaccine (1 of 2) 03/02/2022*   Flu Shot  12/19/2021   Hemoglobin A1C  01/14/2022   Eye exam for diabetics  02/03/2022   Urine Protein Check  07/17/2022   Tetanus Vaccine  02/03/2031   Pneumonia Vaccine  Completed   DEXA scan (bone density measurement)  Completed   HPV Vaccine  Aged Out   Cologuard (Stool DNA test)  Discontinued  *Topic was postponed. The date shown is not the original due date.

## 2021-12-11 DIAGNOSIS — I10 Essential (primary) hypertension: Secondary | ICD-10-CM | POA: Diagnosis not present

## 2021-12-11 DIAGNOSIS — N1832 Chronic kidney disease, stage 3b: Secondary | ICD-10-CM | POA: Diagnosis not present

## 2021-12-11 DIAGNOSIS — E875 Hyperkalemia: Secondary | ICD-10-CM | POA: Diagnosis not present

## 2021-12-11 DIAGNOSIS — E1122 Type 2 diabetes mellitus with diabetic chronic kidney disease: Secondary | ICD-10-CM | POA: Diagnosis not present

## 2021-12-11 DIAGNOSIS — R808 Other proteinuria: Secondary | ICD-10-CM | POA: Diagnosis not present

## 2021-12-21 DIAGNOSIS — N1832 Chronic kidney disease, stage 3b: Secondary | ICD-10-CM | POA: Diagnosis not present

## 2021-12-26 ENCOUNTER — Telehealth: Payer: Self-pay | Admitting: Family Medicine

## 2021-12-26 ENCOUNTER — Other Ambulatory Visit: Payer: Self-pay | Admitting: Physician Assistant

## 2022-01-08 NOTE — Telephone Encounter (Signed)
Pt need a refill on carvedilol sent to [facility]

## 2022-01-17 ENCOUNTER — Encounter: Payer: Self-pay | Admitting: Family Medicine

## 2022-01-17 ENCOUNTER — Ambulatory Visit (INDEPENDENT_AMBULATORY_CARE_PROVIDER_SITE_OTHER): Payer: Medicare HMO | Admitting: Family Medicine

## 2022-01-17 VITALS — BP 120/70 | HR 72 | Temp 98.4°F | Ht 59.0 in | Wt 143.0 lb

## 2022-01-17 DIAGNOSIS — I739 Peripheral vascular disease, unspecified: Secondary | ICD-10-CM | POA: Diagnosis not present

## 2022-01-17 DIAGNOSIS — I10 Essential (primary) hypertension: Secondary | ICD-10-CM

## 2022-01-17 DIAGNOSIS — E038 Other specified hypothyroidism: Secondary | ICD-10-CM

## 2022-01-17 DIAGNOSIS — K219 Gastro-esophageal reflux disease without esophagitis: Secondary | ICD-10-CM

## 2022-01-17 DIAGNOSIS — E782 Mixed hyperlipidemia: Secondary | ICD-10-CM | POA: Diagnosis not present

## 2022-01-17 DIAGNOSIS — T148XXA Other injury of unspecified body region, initial encounter: Secondary | ICD-10-CM | POA: Diagnosis not present

## 2022-01-17 DIAGNOSIS — E118 Type 2 diabetes mellitus with unspecified complications: Secondary | ICD-10-CM

## 2022-01-17 DIAGNOSIS — F330 Major depressive disorder, recurrent, mild: Secondary | ICD-10-CM

## 2022-01-17 DIAGNOSIS — R69 Illness, unspecified: Secondary | ICD-10-CM | POA: Diagnosis not present

## 2022-01-17 LAB — COMPREHENSIVE METABOLIC PANEL
ALT: 36 U/L — ABNORMAL HIGH (ref 0–35)
AST: 26 U/L (ref 0–37)
Albumin: 4 g/dL (ref 3.5–5.2)
Alkaline Phosphatase: 106 U/L (ref 39–117)
BUN: 31 mg/dL — ABNORMAL HIGH (ref 6–23)
CO2: 24 mEq/L (ref 19–32)
Calcium: 9.4 mg/dL (ref 8.4–10.5)
Chloride: 104 mEq/L (ref 96–112)
Creatinine, Ser: 1.43 mg/dL — ABNORMAL HIGH (ref 0.40–1.20)
GFR: 35.38 mL/min — ABNORMAL LOW (ref >60.00)
Glucose, Bld: 103 mg/dL — ABNORMAL HIGH (ref 70–99)
Potassium: 4.9 mEq/L (ref 3.5–5.1)
Sodium: 136 mEq/L (ref 135–145)
Total Bilirubin: 0.6 mg/dL (ref 0.2–1.2)
Total Protein: 7.4 g/dL (ref 6.0–8.3)

## 2022-01-17 LAB — CBC
HCT: 33.4 % — ABNORMAL LOW (ref 36.0–46.0)
Hemoglobin: 11.3 g/dL — ABNORMAL LOW (ref 12.0–15.0)
MCHC: 33.8 g/dL (ref 30.0–36.0)
MCV: 92 fl (ref 78.0–100.0)
Platelets: 295 10*3/uL (ref 150.0–400.0)
RBC: 3.63 Mil/uL — ABNORMAL LOW (ref 3.87–5.11)
RDW: 12.9 % (ref 11.5–15.5)
WBC: 7.5 10*3/uL (ref 4.0–10.5)

## 2022-01-17 LAB — LIPID PANEL
Cholesterol: 77 mg/dL (ref 0–200)
HDL: 28.7 mg/dL — ABNORMAL LOW (ref >39.00)
LDL Cholesterol: 24 mg/dL (ref 0–99)
NonHDL: 47.84
Total CHOL/HDL Ratio: 3
Triglycerides: 118 mg/dL (ref 0.0–149.0)
VLDL: 23.6 mg/dL (ref 0.0–40.0)

## 2022-01-17 LAB — TSH: TSH: 4.1 u[IU]/mL (ref 0.35–5.50)

## 2022-01-17 LAB — T4, FREE: Free T4: 0.81 ng/dL (ref 0.60–1.60)

## 2022-01-17 MED ORDER — SERTRALINE HCL 25 MG PO TABS: ORAL_TABLET | ORAL | 1 refills | Status: DC

## 2022-01-17 NOTE — Assessment & Plan Note (Signed)
Improved.  She will continue omeprazole.  I discussed avoiding dietary triggers.

## 2022-01-17 NOTE — Assessment & Plan Note (Signed)
Check A1c.  Continue Trulicity 0.75 mg weekly.

## 2022-01-17 NOTE — Assessment & Plan Note (Signed)
Patient has had recurrent depression.  We will restart Zoloft 25 mg daily for 14 days and then increase to 50 mg daily.  Discussed risk of sleep issues and weight gain with this medication.  She will follow-up in 8 weeks.

## 2022-01-17 NOTE — Assessment & Plan Note (Signed)
Patient has had some claudication.  We will refer back to vascular surgery.

## 2022-01-17 NOTE — Assessment & Plan Note (Signed)
Well-controlled.  She will continue amlodipine 10 mg once daily and carvedilol 6.25 mg twice daily.

## 2022-01-17 NOTE — Assessment & Plan Note (Signed)
Continue Lipitor 40 mg daily.  Check of the panel.

## 2022-01-17 NOTE — Assessment & Plan Note (Signed)
Check CBC 

## 2022-01-17 NOTE — Patient Instructions (Signed)
Nice to see you. Somebody should contact you to get the vascular appointment scheduled. Please start the Zoloft.  If you notice any side effects please let us know. We will check lab work today.

## 2022-01-17 NOTE — Progress Notes (Signed)
Tommi Rumps, MD Phone: [phone]  Crystal Miller is a 76 y.o. adult who presents today for f/u  HYPERTENSION Disease Monitoring Home BP Monitoring not checking Chest pain- no    Dyspnea- no Medications Compliance-  taking coreg, amlodipine.  Edema- no BMET    Component Value Date/Time   NA 138 01/12/2021 1358   NA 137 09/11/2019 1534   K 4.9 01/20/2021 0941   CL 107 01/12/2021 1358   CO2 23 01/12/2021 1358   GLUCOSE 116 (H) 01/12/2021 1358   BUN 22 01/12/2021 1358   BUN 26 09/11/2019 1534   CREATININE 1.48 (H) 01/12/2021 1358   CALCIUM 9.3 01/12/2021 1358   GFRNONAA 35 (L) 09/11/2019 1534   GFRAA 40 (L) 09/11/2019 1534   HYPERLIPIDEMIA Symptoms Chest pain on exertion:  no   Leg claudication:   yes Medications: Compliance- taking lipitor Right upper quadrant pain- no  Muscle aches- no Lipid Panel     Component Value Date/Time   CHOL 75 01/12/2021 1358   TRIG 186.0 (H) 01/12/2021 1358   HDL 26.60 (L) 01/12/2021 1358   CHOLHDL 3 01/12/2021 1358   VLDL 37.2 01/12/2021 1358   LDLCALC 11 01/12/2021 1358   LDLDIRECT 45.0 02/19/2018 1014   DIABETES Disease Monitoring: Blood Sugar ranges-130-150 Polyuria/phagia/dipsia- no      Optho- UTD Medications: Compliance- taking trulicity Hypoglycemic symptoms- no  GERD: Patient notes her reflux is better since going on omeprazole.  She notes she still gets occasional reflux with certain foods.  Depression: Patient notes some depression over the last few months.  She has no specific cause for this.  In the past she been on Zoloft and responded well.  No SI.  Claudication: Patient reports that her legs have been getting tight when she walks.  She notes it felt similar to the time before she had stents placed in her legs.  Patient wonders about getting an assistive device for getting on and off the toilet.  Bruising: Patient reports bruises on bilateral forearms.  She does not remember hitting her arms.  Social History    Tobacco Use  Smoking Status Former   Packs/day: 1.00   Years: 20.00   Total pack years: 20.00   Types: Cigarettes   Quit date: 2012   Years since quitting: 11.6  Smokeless Tobacco Never    Current Outpatient Medications on File Prior to Visit  Medication Sig Dispense Refill   acetaminophen (TYLENOL) 500 MG tablet Take 1,000 mg by mouth every 6 (six) hours as needed (for pain.).     amLODipine (NORVASC) 10 MG tablet Take 1 tablet (10 mg total) by mouth daily. 90 tablet 1   aspirin EC 81 MG EC tablet Take 1 tablet (81 mg total) by mouth daily. 30 tablet 2   atorvastatin (LIPITOR) 40 MG tablet TAKE 1 TABLET BY MOUTH ONCE DAILY AT  6PM 90 tablet 3   blood glucose meter kit and supplies KIT Dispense based on patient and insurance preference. Use up to four times daily as directed. (FOR ICD-9 250.00, 250.01). 1 each 0   calcium carbonate (OS-CAL) 600 MG tablet Take 600 mg by mouth daily.     carvedilol (COREG) 6.25 MG tablet TAKE 1 TABLET BY MOUTH TWICE DAILY WITH MEALS 180 tablet 0   diclofenac Sodium (VOLTAREN) 1 % GEL Apply 2 g topically 4 (four) times daily.     Multiple Vitamin (MULTIVITAMIN WITH MINERALS) TABS tablet Take 1 tablet by mouth daily. Centrum Silver . In the morning  omeprazole (PRILOSEC) 20 MG capsule Take 1 capsule (20 mg total) by mouth daily before breakfast. **PLEASE CALL OFFICE TO SCHEDULE FOLLOW UP 90 capsule 0   Polyethyl Glycol-Propyl Glycol 0.4-0.3 % SOLN Place 1 drop into both eyes 3 (three) times daily as needed (for dry eyes.).     TRULICITY 7.18 ZB/0.1TA SOPN INJECT ONE SYRINGEFUL SUBCUTANEOUSLY ONCE WEEKLY 12 mL 0   oxybutynin (DITROPAN-XL) 5 MG 24 hr tablet      No current facility-administered medications on file prior to visit.     ROS see history of present illness  Objective  Physical Exam Vitals:   01/17/22 1430  BP: 120/70  Pulse: 72  Temp: 98.4 F (36.9 C)  SpO2: 97%    BP Readings from Last 3 Encounters:  01/17/22 120/70   07/25/21 140/70  07/17/21 118/80   Wt Readings from Last 3 Encounters:  01/17/22 143 lb (64.9 kg)  11/30/21 147 lb (66.7 kg)  07/25/21 147 lb 12.8 oz (67 kg)    Physical Exam Constitutional:      General: He is not in acute distress.    Appearance: He is not diaphoretic.  Cardiovascular:     Rate and Rhythm: Normal rate and regular rhythm.     Heart sounds: Normal heart sounds.  Pulmonary:     Effort: Pulmonary effort is normal.     Breath sounds: Normal breath sounds.  Skin:    General: Skin is warm and dry.  Neurological:     Mental Status: He is alert.      Assessment/Plan: Please see individual problem list.  Problem List Items Addressed This Visit     Depression (Chronic)    Patient has had recurrent depression.  We will restart Zoloft 25 mg daily for 14 days and then increase to 50 mg daily.  Discussed risk of sleep issues and weight gain with this medication.  She will follow-up in 8 weeks.      Relevant Medications   sertraline (ZOLOFT) 25 MG tablet   DM (diabetes mellitus), type 2 with complications (HCC) (Chronic)    Check A1c.  Continue Trulicity 6.82 mg weekly.      Relevant Orders   HgB A1c   Essential hypertension - Primary (Chronic)    Well-controlled.  She will continue amlodipine 10 mg once daily and carvedilol 6.25 mg twice daily.      GERD (gastroesophageal reflux disease) (Chronic)    Improved.  She will continue omeprazole.  I discussed avoiding dietary triggers.      Mixed hyperlipidemia (Chronic)    Continue Lipitor 40 mg daily.  Check of the panel.      Relevant Orders   Comp Met (CMET)   Lipid panel   PAD (peripheral artery disease) (HCC) (Chronic)    Patient has had some claudication.  We will refer back to vascular surgery.      Relevant Orders   Ambulatory referral to Vascular Surgery   Bruising    Check CBC.      Relevant Orders   CBC   Subclinical hypothyroidism   Relevant Orders   TSH   T4, free     Return  in about 8 weeks (around 03/14/2022) for Depression.   Tommi Rumps, MD [place]

## 2022-01-18 LAB — HEMOGLOBIN A1C: Hgb A1c MFr Bld: 6.7 % — ABNORMAL HIGH (ref 4.6–6.5)

## 2022-01-23 ENCOUNTER — Telehealth: Payer: Self-pay

## 2022-01-23 NOTE — Telephone Encounter (Signed)
Patient states Dr. Marikay Alar prescribed sertraline (ZOLOFT) 25 MG tablet for her, but she states 50 MG is too much.  Patient states she has taken this in the past, but it was only 5 MG.  Patient states she has not picked up her prescription from the pharmacy yet, but patient states her copay for this medication will be $40.  Patient states she would like to know if we can prescribe something for her that would be less expensive.  *Patient states her preferred pharmacy is [facility] on [address]

## 2022-01-24 ENCOUNTER — Other Ambulatory Visit: Payer: Self-pay | Admitting: Family

## 2022-01-24 DIAGNOSIS — F330 Major depressive disorder, recurrent, mild: Secondary | ICD-10-CM

## 2022-01-24 MED ORDER — SERTRALINE HCL 25 MG PO TABS: 25.0000 mg | ORAL_TABLET | Freq: Every day | ORAL | 1 refills | Status: DC

## 2022-02-02 ENCOUNTER — Telehealth: Payer: Self-pay | Admitting: Family Medicine

## 2022-02-02 NOTE — Telephone Encounter (Signed)
Two note attached see both DME ready to sign.

## 2022-02-02 NOTE — Telephone Encounter (Signed)
Patient states she is returning Charlyne Mom, CMA's call.  I read Dr. Bernardo Heater message to patient.  Patient states she can walk one block with her walker.  Patient states she has trouble with her balance.  I let patient know that Dr. Birdie Sons will complete the form and leave it up front in our office so she can come sign it.  Patient states she will try to come by on Monday (02/05/2022) to sign the form.

## 2022-02-02 NOTE — Telephone Encounter (Signed)
-----   Message from Glori Luis, MD sent at 01/17/2022  2:45 PM EDT ----- This patient needs equipment to help her get on and off the toilet. I can never get this kind of thing ordered correctly. Do you know what the order is for this? Thanks.

## 2022-02-02 NOTE — Telephone Encounter (Signed)
I am working on filling the patients transportation form out. Can you ask her if she can walk 1 block, 3 blocks or less, or 6 blocks or less with her walker? Once I know this I can sign off on the form and we can get it sent in for her, though there is one page that she did not sign that will need to be signed first.

## 2022-02-02 NOTE — Addendum Note (Signed)
Addended by: Dennie Bible on: 02/02/2022 04:23 PM   Modules accepted: Orders

## 2022-02-02 NOTE — Telephone Encounter (Signed)
LVM for patient to call back and ask for Amando Chaput.  Matson Welch,cma  

## 2022-02-02 NOTE — Progress Notes (Signed)
Placed in quick sign. 

## 2022-02-06 NOTE — Telephone Encounter (Signed)
Patient signed and [name] signed and form was faxed and confirmation given. Arda Daggs,cma

## 2022-02-06 NOTE — Telephone Encounter (Signed)
I believe I signed this yesterday and placed in the signed basket.

## 2022-02-09 DIAGNOSIS — H40003 Preglaucoma, unspecified, bilateral: Secondary | ICD-10-CM | POA: Diagnosis not present

## 2022-02-09 DIAGNOSIS — H43813 Vitreous degeneration, bilateral: Secondary | ICD-10-CM | POA: Diagnosis not present

## 2022-02-09 DIAGNOSIS — H2513 Age-related nuclear cataract, bilateral: Secondary | ICD-10-CM | POA: Diagnosis not present

## 2022-02-09 LAB — HM DIABETES EYE EXAM

## 2022-02-20 ENCOUNTER — Other Ambulatory Visit: Payer: Self-pay | Admitting: Family Medicine

## 2022-02-20 DIAGNOSIS — E118 Type 2 diabetes mellitus with unspecified complications: Secondary | ICD-10-CM

## 2022-03-09 ENCOUNTER — Other Ambulatory Visit (INDEPENDENT_AMBULATORY_CARE_PROVIDER_SITE_OTHER): Payer: Self-pay | Admitting: Vascular Surgery

## 2022-03-09 DIAGNOSIS — I739 Peripheral vascular disease, unspecified: Secondary | ICD-10-CM

## 2022-03-12 ENCOUNTER — Ambulatory Visit (INDEPENDENT_AMBULATORY_CARE_PROVIDER_SITE_OTHER): Payer: Medicare HMO

## 2022-03-12 ENCOUNTER — Ambulatory Visit (INDEPENDENT_AMBULATORY_CARE_PROVIDER_SITE_OTHER): Payer: Medicare HMO | Admitting: Vascular Surgery

## 2022-03-12 ENCOUNTER — Encounter (INDEPENDENT_AMBULATORY_CARE_PROVIDER_SITE_OTHER): Payer: Self-pay | Admitting: Vascular Surgery

## 2022-03-12 VITALS — BP 157/76 | HR 71 | Resp 16 | Wt 140.0 lb

## 2022-03-12 DIAGNOSIS — I10 Essential (primary) hypertension: Secondary | ICD-10-CM

## 2022-03-12 DIAGNOSIS — E782 Mixed hyperlipidemia: Secondary | ICD-10-CM | POA: Diagnosis not present

## 2022-03-12 DIAGNOSIS — I739 Peripheral vascular disease, unspecified: Secondary | ICD-10-CM | POA: Diagnosis not present

## 2022-03-12 DIAGNOSIS — I70213 Atherosclerosis of native arteries of extremities with intermittent claudication, bilateral legs: Secondary | ICD-10-CM | POA: Diagnosis not present

## 2022-03-12 DIAGNOSIS — E118 Type 2 diabetes mellitus with unspecified complications: Secondary | ICD-10-CM | POA: Diagnosis not present

## 2022-03-12 DIAGNOSIS — I70219 Atherosclerosis of native arteries of extremities with intermittent claudication, unspecified extremity: Secondary | ICD-10-CM | POA: Insufficient documentation

## 2022-03-12 DIAGNOSIS — K219 Gastro-esophageal reflux disease without esophagitis: Secondary | ICD-10-CM

## 2022-03-12 NOTE — Progress Notes (Unsigned)
MRN : [id number]  Crystal Miller is a 76 y.o. (Nov 11, 1945) adult who presents with chief complaint of check circulation.  History of Present Illness:   The patient was last seen about 2 years ago.  She returns to the office for followup and review status post angiogram with intervention on 04/22/2018.    1.  Percutaneous transluminal angioplasty right superficial femoral and popliteal arteries to 5 mm distally and 6 mm at the origin of the SFA 2.  Percutaneous transluminal angioplasty right posterior tibial to 2 mm in its proximal two thirds and then 3 mm from the origin into the tibioperoneal trunk 3.  Percutaneous transluminal angioplasty of the right profunda femoris to 4 mm proximally   The patient notes improvement in the lower extremity symptoms. No interval shortening of the patient's claudication distance or rest pain symptoms. Previous wounds have now healed.  No new ulcers or wounds have occurred since the last visit.   There have been no significant changes to the patient's overall health care.   The patient denies amaurosis fugax or recent TIA symptoms. There are no recent neurological changes noted. The patient denies history of DVT, PE or superficial thrombophlebitis. The patient denies recent episodes of angina or shortness of breath.    ABI's Rt=1.07 and Lt=1.11  (previous ABI's Rt=0.91 and Lt=0.98)  Previous duplex ultrasound bilateral lower extremity arterial system demonstrates uniform velocities throughout the right and left lower extremities.  Incidental notation of a moderate stenosis in the right deep femoral.  No outpatient medications have been marked as taking for the 03/12/22 encounter (Appointment) with Crystal Miller, Crystal Craver, MD.    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 does not drive Lt side weakness    Past Surgical History:  Procedure Laterality Date   KIDNEY STONE SURGERY     LOWER EXTREMITY ANGIOGRAPHY Left 12/31/2017   Procedure: LOWER EXTREMITY ANGIOGRAPHY;  Surgeon: Renford Dills, MD;  Location: [facility] IN[facility]ASIVE [facility];  Service: Cardiovascular;  Laterality: Left;   LOWER EXTREMITY ANGIOGRAPHY Right 04/22/2018   Procedure: LOWER EXTREMITY ANGIOGRAPHY;  Surgeon: Renford Dills, MD;  Location: [facility] INVASIVE CV LAB;  Service: Cardiovascular;  Laterality: Right;    Social History Social History   Tobacco Use   Smoking status: Former    Packs/day: 1.00    Years: 20.00    Total pack years: 20.00    Types: Cigarettes    Quit date: 2010/09/26    Years since quitting: 11.8   Smokeless tobacco: Never  Vaping Use   Vaping Use: Never used  Substance Use Topics   Alcohol use: No   Drug use: No    Family History Family History  Problem Relation Age of Onset   Alzheimer's disease Mother    Arthritis Mother    Hypertension Mother    CVA Father    CAD Father    Heart disease Father    Stroke Father    Hypertension Father  Arthritis Sister    Stroke Brother    Diabetes Brother    Heart disease Brother    Bone cancer Brother    Lung cancer Brother    Diabetes Son    Breast cancer Neg Hx     Allergies  Allergen Reactions   Metformin And Related Diarrhea     REVIEW OF SYSTEMS (Negative unless checked)  Constitutional: [] Weight loss  [] Fever  [] Chills Cardiac: [] Chest pain   [] Chest pressure   [] Palpitations   [] Shortness of breath when laying flat   [] Shortness of breath with exertion. Vascular:  [x] Pain in legs with walking   [] Pain in legs at rest  [] History of DVT    [] Phlebitis   [] Swelling in legs   [] Varicose veins   [] Non-healing ulcers Pulmonary:   [] Uses home oxygen   [] Productive cough   [] Hemoptysis   [] Wheeze  [] COPD   [] Asthma Neurologic:  [] Dizziness   [] Seizures   [] History of stroke   [] History of TIA  [] Aphasia   [] Vissual changes   [] Weakness or numbness in arm   [] Weakness or numbness in leg Musculoskeletal:   [] Joint swelling   [] Joint pain   [] Low back pain Hematologic:  [] Easy bruising  [] Easy bleeding   [] Hypercoagulable state   [] Anemic Gastrointestinal:  [] Diarrhea   [] Vomiting  [x] Gastroesophageal reflux/heartburn   [] Difficulty swallowing. Genitourinary:  [] Chronic kidney disease   [] Difficult urination  [] Frequent urination   [] Blood in urine Skin:  [] Rashes   [] Ulcers  Psychological:  [] History of anxiety   []  History of major depression.  Physical Examination  There were no vitals filed for this visit. There is no height or weight on file to calculate BMI. Gen: WD/WN, NAD Head: [place]/AT, No temporalis wasting.  Ear/Nose/Throat: Hearing grossly intact, nares w/o erythema or drainage Eyes: PER, EOMI, sclera nonicteric.  Neck: Supple, no masses.  No bruit or JVD.  Pulmonary:  Good air movement, no audible wheezing, no use of accessory muscles.  Cardiac: RRR, normal S1, S2, no Murmurs. Vascular:  mild trophic changes, no open wounds Vessel Right Left  Radial Palpable Palpable  PT Palpable Palpable  DP Not Palpable Not Palpable  Gastrointestinal: soft, non-distended. No guarding/no peritoneal signs.  Musculoskeletal: M/S 5/5 throughout.  No visible deformity.  Neurologic: CN 2-12 intact. Pain and light touch intact in extremities.  Symmetrical.  Speech is fluent. Motor exam as listed above. Psychiatric: Judgment intact, Mood & affect appropriate for pt's clinical situation. Dermatologic: No rashes or ulcers noted.  No changes consistent with cellulitis.   CBC Lab Results  Component Value Date   WBC 7.5 01/17/2022   HGB 11.3  (L) 01/17/2022   HCT 33.4 (L) 01/17/2022   MCV 92.0 01/17/2022   PLT 295.0 01/17/2022    BMET    Component Value Date/Time   NA 136 01/17/2022 1451   NA 137 09/11/2019 1534   K 4.9 01/17/2022 1451   CL 104 01/17/2022 1451   CO2 24 01/17/2022 1451   GLUCOSE 103 (H) 01/17/2022 1451   BUN 31 (H) 01/17/2022 1451   BUN 26 09/11/2019 1534   CREATININE 1.43 (H) 01/17/2022 1451   CALCIUM 9.4 01/17/2022 1451   GFRNONAA 35 (L) 09/11/2019 1534   GFRAA 40 (L) 09/11/2019 1534   CrCl cannot be calculated (Patient's most recent lab result is older than the maximum 21 days allowed.).  COAG No results found for: "INR", "PROTIME"  Radiology No results found.   Assessment/Plan 1. Atherosclerosis of native artery of both  lower extremities with intermittent claudication (HCC)  Recommend:  The patient has evidence of atherosclerosis of the lower extremities with claudication.  The patient does not voice lifestyle limiting changes at this point in time.  Noninvasive studies do not suggest clinically significant change.  No invasive studies, angiography or surgery at this time The patient should continue walking and begin a more formal exercise program.  The patient should continue antiplatelet therapy and aggressive treatment of the lipid abnormalities  No changes in the patient's medications at this time  Continued surveillance is indicated as atherosclerosis is likely to progress with time.    The patient will continue follow up with noninvasive studies as ordered.   - VAS [place] ABI WITH/WO TBI; Future  2. Essential hypertension Continue antihypertensive medications as already ordered, these medications have been reviewed and there are no changes at this time.   3. Gastroesophageal reflux disease, unspecified whether esophagitis present Continue PPI as already ordered, this medication has been reviewed and there are no changes at this time.  Avoidence of caffeine and  alcohol  Moderate elevation of the head of the bed    4. DM (diabetes mellitus), type 2 with complications (HCC) Continue hypoglycemic medications as already ordered, these medications have been reviewed and there are no changes at this time.  Hgb A1C to be monitored as already arranged by primary service   5. Mixed hyperlipidemia Continue statin as ordered and reviewed, no changes at this time   6. PAD (peripheral artery disease) (HCC) See #1 - VAS [place] ABI WITH/WO TBI    Levora Dredge, MD  03/12/2022 8:28 AM

## 2022-03-16 ENCOUNTER — Encounter: Payer: Self-pay | Admitting: Family Medicine

## 2022-03-16 ENCOUNTER — Ambulatory Visit (INDEPENDENT_AMBULATORY_CARE_PROVIDER_SITE_OTHER): Payer: Medicare HMO | Admitting: Family Medicine

## 2022-03-16 VITALS — BP 90/60 | HR 75 | Temp 98.5°F | Ht 59.0 in | Wt 139.2 lb

## 2022-03-16 DIAGNOSIS — Z23 Encounter for immunization: Secondary | ICD-10-CM | POA: Diagnosis not present

## 2022-03-16 DIAGNOSIS — R69 Illness, unspecified: Secondary | ICD-10-CM | POA: Diagnosis not present

## 2022-03-16 DIAGNOSIS — F330 Major depressive disorder, recurrent, mild: Secondary | ICD-10-CM

## 2022-03-16 NOTE — Progress Notes (Signed)
Tommi Rumps, MD Phone: [phone]  Crystal Miller is a 76 y.o. adult who presents today for f/u.  Depression: well controlled on zoloft. Notes she is doing much better. No anxiety. No SI. Denies side effects.   Social History   Tobacco Use  Smoking Status Former   Packs/day: 1.00   Years: 20.00   Total pack years: 20.00   Types: Cigarettes   Quit date: 2012   Years since quitting: 11.8  Smokeless Tobacco Never    Current Outpatient Medications on File Prior to Visit  Medication Sig Dispense Refill   acetaminophen (TYLENOL) 500 MG tablet Take 1,000 mg by mouth every 6 (six) hours as needed (for pain.).     amLODipine (NORVASC) 10 MG tablet Take 1 tablet (10 mg total) by mouth daily. 90 tablet 1   aspirin EC 81 MG EC tablet Take 1 tablet (81 mg total) by mouth daily. 30 tablet 2   atorvastatin (LIPITOR) 40 MG tablet TAKE 1 TABLET BY MOUTH ONCE DAILY AT  6PM 90 tablet 3   blood glucose meter kit and supplies KIT Dispense based on patient and insurance preference. Use up to four times daily as directed. (FOR ICD-9 250.00, 250.01). 1 each 0   calcium carbonate (OS-CAL) 600 MG tablet Take 600 mg by mouth daily.     carvedilol (COREG) 6.25 MG tablet TAKE 1 TABLET BY MOUTH TWICE DAILY WITH MEALS 180 tablet 0   diclofenac Sodium (VOLTAREN) 1 % GEL Apply 2 g topically 4 (four) times daily.     Multiple Vitamin (MULTIVITAMIN WITH MINERALS) TABS tablet Take 1 tablet by mouth daily. Centrum Silver . In the morning     omeprazole (PRILOSEC) 20 MG capsule Take 1 capsule (20 mg total) by mouth daily before breakfast. **PLEASE CALL OFFICE TO SCHEDULE FOLLOW UP 90 capsule 0   oxybutynin (DITROPAN-XL) 5 MG 24 hr tablet      Polyethyl Glycol-Propyl Glycol 0.4-0.3 % SOLN Place 1 drop into both eyes 3 (three) times daily as needed (for dry eyes.).     sertraline (ZOLOFT) 25 MG tablet Take 1 tablet (25 mg total) by mouth daily. 90 tablet 1   TRULICITY 6.57 XU/3.8BF SOPN INJECT ONE SYRINGEFUL  SUBCUTANEOUSLY ONCE WEEKLY 12 mL 0   No current facility-administered medications on file prior to visit.     ROS see history of present illness  Objective  Physical Exam Vitals:   03/16/22 1526  BP: 90/60  Pulse: 75  Temp: 98.5 F (36.9 C)  SpO2: 95%    BP Readings from Last 3 Encounters:  03/16/22 90/60  03/12/22 (!) 157/76  01/17/22 120/70   Wt Readings from Last 3 Encounters:  03/16/22 139 lb 3.2 oz (63.1 kg)  03/12/22 140 lb (63.5 kg)  01/17/22 143 lb (64.9 kg)    Physical Exam Constitutional:      General: He is not in acute distress.    Appearance: He is not diaphoretic.  Cardiovascular:     Rate and Rhythm: Normal rate and regular rhythm.     Heart sounds: Normal heart sounds.  Pulmonary:     Effort: Pulmonary effort is normal.     Breath sounds: Normal breath sounds.  Neurological:     Mental Status: He is alert.      Assessment/Plan: Please see individual problem list.  Problem List Items Addressed This Visit     Depression (Chronic)    Well controlled. Continue zoloft 50 mg daily.       Other  Visit Diagnoses     Need for immunization against influenza    -  Primary   Relevant Orders   Flu Vaccine QUAD High Dose(Fluad) (Completed)      Patient notes her transport paperwork was never received. We will send this again.    Return in about 4 months (around 07/17/2022).   Tommi Rumps, MD [place]

## 2022-03-16 NOTE — Assessment & Plan Note (Signed)
Well controlled Continue zoloft 50mg daily 

## 2022-03-28 ENCOUNTER — Other Ambulatory Visit: Payer: Self-pay | Admitting: Physician Assistant

## 2022-03-28 ENCOUNTER — Other Ambulatory Visit: Payer: Self-pay | Admitting: Family Medicine

## 2022-03-28 DIAGNOSIS — I10 Essential (primary) hypertension: Secondary | ICD-10-CM

## 2022-04-04 ENCOUNTER — Telehealth: Payer: Self-pay

## 2022-04-04 ENCOUNTER — Other Ambulatory Visit: Payer: Self-pay | Admitting: Physician Assistant

## 2022-04-04 ENCOUNTER — Other Ambulatory Visit: Payer: Self-pay | Admitting: Family

## 2022-04-04 NOTE — Telephone Encounter (Signed)
LVM for patient to call back to inform her that we cannot refill the Omeprazole because Dr. Birdie Sons is not here to ask him to refill and another provider manages this medication and she needs to call them and they are asking her to schedule a follow up appointment with them. Casy Brunetto,cma

## 2022-04-04 NOTE — Telephone Encounter (Signed)
Please advise 

## 2022-04-04 NOTE — Telephone Encounter (Signed)
Pt requesting RF on her omeprazole (PRILOSEC) 20 MG capsule to be sent to [facility] on [address] asap as pt states she has been out for awhile. Informed pt that this rx has been getting refilled by Mike Gip PA-C @[facility]. Instructed pt to reach out to their ofc for refill. Pt stated she would like PCP to refill for her instead of them. Pt requesting callback.... Marland Kitchen

## 2022-04-05 NOTE — Telephone Encounter (Signed)
Called and LVM for patient to call back.  Kalie Cabral,cma  

## 2022-04-06 NOTE — Telephone Encounter (Signed)
Patient returned office phone call and note was read. Patient stated that she did call the other provider and medication has been filled.

## 2022-04-09 DIAGNOSIS — R69 Illness, unspecified: Secondary | ICD-10-CM | POA: Diagnosis not present

## 2022-04-09 DIAGNOSIS — E785 Hyperlipidemia, unspecified: Secondary | ICD-10-CM | POA: Diagnosis not present

## 2022-04-09 DIAGNOSIS — F419 Anxiety disorder, unspecified: Secondary | ICD-10-CM | POA: Diagnosis not present

## 2022-04-09 DIAGNOSIS — K08109 Complete loss of teeth, unspecified cause, unspecified class: Secondary | ICD-10-CM | POA: Diagnosis not present

## 2022-04-09 DIAGNOSIS — M1990 Unspecified osteoarthritis, unspecified site: Secondary | ICD-10-CM | POA: Diagnosis not present

## 2022-04-09 DIAGNOSIS — E1122 Type 2 diabetes mellitus with diabetic chronic kidney disease: Secondary | ICD-10-CM | POA: Diagnosis not present

## 2022-04-09 DIAGNOSIS — R269 Unspecified abnormalities of gait and mobility: Secondary | ICD-10-CM | POA: Diagnosis not present

## 2022-04-09 DIAGNOSIS — K219 Gastro-esophageal reflux disease without esophagitis: Secondary | ICD-10-CM | POA: Diagnosis not present

## 2022-04-09 DIAGNOSIS — E1151 Type 2 diabetes mellitus with diabetic peripheral angiopathy without gangrene: Secondary | ICD-10-CM | POA: Diagnosis not present

## 2022-04-09 DIAGNOSIS — N3941 Urge incontinence: Secondary | ICD-10-CM | POA: Diagnosis not present

## 2022-04-09 DIAGNOSIS — M810 Age-related osteoporosis without current pathological fracture: Secondary | ICD-10-CM | POA: Diagnosis not present

## 2022-04-09 DIAGNOSIS — I69354 Hemiplegia and hemiparesis following cerebral infarction affecting left non-dominant side: Secondary | ICD-10-CM | POA: Diagnosis not present

## 2022-04-09 DIAGNOSIS — N189 Chronic kidney disease, unspecified: Secondary | ICD-10-CM | POA: Diagnosis not present

## 2022-04-11 ENCOUNTER — Telehealth: Payer: Self-pay

## 2022-04-11 NOTE — Telephone Encounter (Signed)
Pt requesting RF on her carvedilol (COREG) 6.25 MG tablet to be sent to [facility] on [address]. Pt requesting callback....Marland KitchenMarland Kitchen

## 2022-04-17 ENCOUNTER — Other Ambulatory Visit: Payer: Self-pay

## 2022-04-17 DIAGNOSIS — I10 Essential (primary) hypertension: Secondary | ICD-10-CM | POA: Diagnosis not present

## 2022-04-17 DIAGNOSIS — R808 Other proteinuria: Secondary | ICD-10-CM | POA: Diagnosis not present

## 2022-04-17 DIAGNOSIS — E1122 Type 2 diabetes mellitus with diabetic chronic kidney disease: Secondary | ICD-10-CM | POA: Diagnosis not present

## 2022-04-17 DIAGNOSIS — N1832 Chronic kidney disease, stage 3b: Secondary | ICD-10-CM | POA: Diagnosis not present

## 2022-04-17 MED ORDER — CARVEDILOL 6.25 MG PO TABS: 6.2500 mg | ORAL_TABLET | Freq: Two times a day (BID) | ORAL | 0 refills | Status: DC

## 2022-04-17 NOTE — Telephone Encounter (Signed)
Prescription sent in. Attempted to call Patient- no answer

## 2022-04-23 DIAGNOSIS — E1122 Type 2 diabetes mellitus with diabetic chronic kidney disease: Secondary | ICD-10-CM | POA: Diagnosis not present

## 2022-04-23 DIAGNOSIS — N1832 Chronic kidney disease, stage 3b: Secondary | ICD-10-CM | POA: Diagnosis not present

## 2022-04-23 DIAGNOSIS — R808 Other proteinuria: Secondary | ICD-10-CM | POA: Diagnosis not present

## 2022-04-23 DIAGNOSIS — E875 Hyperkalemia: Secondary | ICD-10-CM | POA: Diagnosis not present

## 2022-04-23 DIAGNOSIS — I10 Essential (primary) hypertension: Secondary | ICD-10-CM | POA: Diagnosis not present

## 2022-05-09 ENCOUNTER — Encounter: Payer: Self-pay | Admitting: Family Medicine

## 2022-05-10 ENCOUNTER — Ambulatory Visit (INDEPENDENT_AMBULATORY_CARE_PROVIDER_SITE_OTHER): Payer: Medicare HMO | Admitting: Physician Assistant

## 2022-05-10 ENCOUNTER — Encounter: Payer: Self-pay | Admitting: Physician Assistant

## 2022-05-10 VITALS — BP 132/62 | HR 74 | Ht 59.0 in | Wt 132.0 lb

## 2022-05-10 DIAGNOSIS — K219 Gastro-esophageal reflux disease without esophagitis: Secondary | ICD-10-CM

## 2022-05-10 DIAGNOSIS — K224 Dyskinesia of esophagus: Secondary | ICD-10-CM

## 2022-05-10 MED ORDER — OMEPRAZOLE 20 MG PO CPDR: 20.0000 mg | DELAYED_RELEASE_CAPSULE | Freq: Every day | ORAL | 4 refills | Status: DC

## 2022-05-10 NOTE — Patient Instructions (Signed)
_______________________________________________________  If you are age 77 or older, your body mass index should be between 23-30. Your Body mass index is 26.66 kg/m. If this is out of the aforementioned range listed, please consider follow up with your Primary Care Provider.  If you are age 44 or younger, your body mass index should be between 19-25. Your Body mass index is 26.66 kg/m. If this is out of the aformentioned range listed, please consider follow up with your Primary Care Provider.   We have refilled Omeprazole 20 mg.  Follow up in a year.  The [facility] providers would like to encourage you to use [facility] to communicate with providers for non-urgent requests or questions.  Due to long hold times on the telephone, sending your provider a message by [facility] may be a faster and more efficient way to get a response.  Please allow 48 business hours for a response.  Please remember that this is for non-urgent requests.   It was a pleasure to see you today!  Thank you for trusting me with your gastrointestinal care!    __

## 2022-05-10 NOTE — Progress Notes (Signed)
Subjective:    Patient ID: Crystal Miller, adult    DOB: 10/10/1945, 76 y.o.   MRN: [id number]  HPI Crystal Miller is a pleasant 76 year old female, established with Dr. Carlean Purl and seen by myself as a new patient in July 2022.  At that time she came in with GERD complaints and mild dysphagia symptoms.  She comes in today for follow-up and medication refill.  She had been started on omeprazole 20 mg p.o. every morning which she has been taking over the past year and a half and says this works very well to control any reflux symptoms.  Very occasionally she will have some regurgitation at nighttime with fluid or food coming up into the back of her throat, and says this usually occurs if she eats too late.  She did have some symptoms last night after eating tuna.  She denies any heartburn or indigestion symptoms during the daytime. She had undergone barium swallow in August 2022 which showed normal pharyngeal motion with swallowing, no laryngeal penetration or aspiration, no upper esophageal web stricture diverticuli, there was evidence of esophageal dysmotility with disruption of the primary peristaltic waves, and occasional tertiary contractions, mild esophageal stasis no esophageal mass or stricture, she has moderate sliding hiatal hernia with minimal inducible GE reflux.  13 mm barium tablet passed without difficulty  She says as long as she eats slowly, chews her food carefully, she does not have any issues with dysphagia.   She has not had colonoscopy but did do a Cologuard in 2022 which was negative Her other medical problems include history of prior CVA 2018, hypertension, peripheral arterial disease, and adult onset diabetes mellitus as well as chronic kidney disease stage IIIb.  Review of Systems Pertinent positive and negative review of systems were noted in the above HPI section.  All other review of systems was otherwise negative.   Outpatient Encounter Medications as of 05/10/2022  Medication  Sig   acetaminophen (TYLENOL) 500 MG tablet Take 1,000 mg by mouth every 6 (six) hours as needed (for pain.).   amLODipine (NORVASC) 10 MG tablet Take 1 tablet by mouth once daily   aspirin EC 81 MG EC tablet Take 1 tablet (81 mg total) by mouth daily.   atorvastatin (LIPITOR) 40 MG tablet TAKE 1 TABLET BY MOUTH ONCE DAILY AT  6PM   blood glucose meter kit and supplies KIT Dispense based on patient and insurance preference. Use up to four times daily as directed. (FOR ICD-9 250.00, 250.01).   calcium carbonate (OS-CAL) 600 MG tablet Take 600 mg by mouth daily.   carvedilol (COREG) 6.25 MG tablet Take 1 tablet (6.25 mg total) by mouth 2 (two) times daily with a meal.   diclofenac Sodium (VOLTAREN) 1 % GEL Apply 2 g topically 4 (four) times daily.   Multiple Vitamin (MULTIVITAMIN WITH MINERALS) TABS tablet Take 1 tablet by mouth daily. Centrum Silver . In the morning   oxybutynin (DITROPAN-XL) 5 MG 24 hr tablet    Polyethyl Glycol-Propyl Glycol 0.4-0.3 % SOLN Place 1 drop into both eyes 3 (three) times daily as needed (for dry eyes.).   sertraline (ZOLOFT) 25 MG tablet Take 1 tablet (25 mg total) by mouth daily.   TRULICITY 1.66 AY/3.0ZS SOPN INJECT ONE SYRINGEFUL SUBCUTANEOUSLY ONCE WEEKLY   [DISCONTINUED] omeprazole (PRILOSEC) 20 MG capsule TAKE 1 CAPSULE BY MOUTH ONCE DAILY IN THE MORNING BEFORE BREAKFAST . APPOINTMENT REQUIRED FOR FUTURE REFILLS   omeprazole (PRILOSEC) 20 MG capsule Take 1 capsule (20 mg total)  by mouth daily.   No facility-administered encounter medications on file as of 05/10/2022.   Allergies  Allergen Reactions   Metformin And Related Diarrhea   Patient Active Problem List   Diagnosis Date Noted   Atherosclerosis of native arteries of extremity with intermittent claudication ([place]) 03/12/2022   Bruising 01/17/2022   Foot laceration 07/25/2021   Onychomycosis 07/25/2021   Subclinical hypothyroidism 04/23/2020   Chronic kidney disease due to hypertension 12/02/2019    Localized edema 12/02/2019   GERD (gastroesophageal reflux disease) 08/19/2019   Diabetic retinopathy ([place]) 12/04/2018   Overactive bladder 06/30/2018   Depression 02/19/2018   PAD (peripheral artery disease) ([place]) 10/10/2017   DM (diabetes mellitus), type 2 with complications ([place]) 92/42/6834   Essential hypertension 11/01/2016   Mixed hyperlipidemia 11/01/2016   Memory change 11/01/2016   History of stroke 10/01/2016   Social History   Socioeconomic History   Marital status: Widowed    Spouse name: Not on file   Number of children: 3   Years of education: Not on file   Highest education level: Not on file  Occupational History   Occupation: Scientist, water quality    Comment: retired   Occupation: retired  Tobacco Use   Smoking status: Former    Packs/day: 1.00    Years: 20.00    Total pack years: 20.00    Types: Cigarettes    Quit date: 2012    Years since quitting: 11.9   Smokeless tobacco: Never  Vaping Use   Vaping Use: Never used  Substance and Sexual Activity   Alcohol use: No   Drug use: No   Sexual activity: Not Currently    Birth control/protection: Post-menopausal  Other Topics Concern   Not on file  Social History Narrative   Not on file   Social Determinants of Health   Financial Resource Strain: Low Risk  (11/30/2021)   Overall Financial Resource Strain (CARDIA)    Difficulty of Paying Living Expenses: Not hard at all  Food Insecurity: No Food Insecurity (11/30/2021)   Hunger Vital Sign    Worried About Running Out of Food in the Last Year: Never true    [place] in the Last Year: Never true  Transportation Needs: No Transportation Needs (11/30/2021)   PRAPARE - Hydrologist (Medical): No    Lack of Transportation (Non-Medical): No  Physical Activity: Sufficiently Active (11/30/2021)   Exercise Vital Sign    Days of Exercise per Week: 5 days    Minutes of Exercise per Session: 30 min  Stress: No Stress Concern  Present (11/30/2021)   [facility]    Feeling of Stress : Not at all  Social Connections: Moderately Integrated (11/30/2021)   Social Connection and Isolation Panel [NHANES]    Frequency of Communication with Friends and Family: More than three times a week    Frequency of Social Gatherings with Friends and Family: More than three times a week    Attends Religious Services: More than 4 times per year    Active Member of [employer] or Organizations: Yes    Attends Archivist Meetings: 1 to 4 times per year    Marital Status: Widowed  Intimate Partner Violence: Not At Risk (11/30/2021)   Humiliation, Afraid, Rape, and Kick questionnaire    Fear of Current or Ex-Partner: No    Emotionally Abused: No    Physically Abused: No    Sexually  Abused: No    Mr. Glore family history includes Alzheimer's disease in his mother; Arthritis in his mother and sister; Bone cancer in his brother; CAD in his father; CVA in his father; Diabetes in his brother and son; Heart disease in his brother and father; Hypertension in his father and mother; Lung cancer in his brother; Stroke in his brother and father.      Objective:    Vitals:   05/10/22 1509  BP: 132/62  Pulse: 74    Physical Exam. Well-developed well-nourished elderly female in no acute distress.  Height, Weight, 132 BMI 26.6   very pleasant, ambulates with a walker  HEENT; nontraumatic normocephalic, EOMI, PE R LA, sclera anicteric. Oropharynx; not examined today Neck; supple, no JVD Cardiovascular; regular rate and rhythm with S1-S2, no murmur rub or gallop Pulmonary; Clear bilaterally Abdomen; soft, nontender, nondistended, no palpable mass or hepatosplenomegaly, bowel sounds are active Rectal; not done today Skin; benign exam, no jaundice rash or appreciable lesions Extremities; no clubbing cyanosis or edema skin warm and dry Neuro/Psych; alert and oriented x4,  grossly nonfocal mood and affect appropriate        Assessment & Plan:   #10 76 year old female with chronic GERD, well-controlled on low-dose omeprazole 20 mg p.o. every morning Barium swallow 2022 had shown a moderate sliding hiatal hernia, no evidence of stricture and minimal inducible GE reflux  #2 previous complaints of mild dysphagia-patient is status post CVA.  Barium swallow August 2022 with no normal pharyngeal motion, no laryngeal penetration or aspiration, no upper esophageal abnormality, she does have esophageal dysmotility with disruption of peristaltic waves and some evidence of esophageal stasis,.  Tablet passed without difficulty  Currently no complaints of dysphagia  #3 colon screening-negative Cologuard 2022 #4 prior history of CVA, ambulates with a walker #5.  Adult onset diabetes mellitus #6.  Peripheral arterial disease #7.  Hypertension #8.  Chronic kidney disease stage III  Plan; continue omeprazole 20 mg p.o. every morning AC breakfast, #90/4 refills We will plan follow-up in about 18 months unless she is having issues in the interim than happy to see her at any point.  Teandre Hamre Genia Harold PA-C 05/10/2022   Cc: [name], MD

## 2022-05-23 ENCOUNTER — Other Ambulatory Visit: Payer: Self-pay | Admitting: Family Medicine

## 2022-05-23 DIAGNOSIS — E118 Type 2 diabetes mellitus with unspecified complications: Secondary | ICD-10-CM

## 2022-05-28 ENCOUNTER — Other Ambulatory Visit: Payer: Self-pay | Admitting: Family Medicine

## 2022-05-28 DIAGNOSIS — E782 Mixed hyperlipidemia: Secondary | ICD-10-CM

## 2022-06-21 ENCOUNTER — Other Ambulatory Visit: Payer: Self-pay | Admitting: Family Medicine

## 2022-06-21 DIAGNOSIS — I10 Essential (primary) hypertension: Secondary | ICD-10-CM

## 2022-07-18 ENCOUNTER — Other Ambulatory Visit: Payer: Self-pay | Admitting: Family Medicine

## 2022-07-18 ENCOUNTER — Other Ambulatory Visit: Payer: Self-pay | Admitting: Family

## 2022-07-18 ENCOUNTER — Ambulatory Visit: Payer: Medicare HMO | Admitting: Family Medicine

## 2022-07-30 ENCOUNTER — Encounter: Payer: Self-pay | Admitting: Family Medicine

## 2022-07-30 ENCOUNTER — Ambulatory Visit (INDEPENDENT_AMBULATORY_CARE_PROVIDER_SITE_OTHER): Payer: Medicare HMO | Admitting: Family Medicine

## 2022-07-30 VITALS — BP 124/74 | HR 73 | Temp 98.1°F | Ht 59.0 in | Wt 139.8 lb

## 2022-07-30 DIAGNOSIS — F330 Major depressive disorder, recurrent, mild: Secondary | ICD-10-CM

## 2022-07-30 DIAGNOSIS — I10 Essential (primary) hypertension: Secondary | ICD-10-CM

## 2022-07-30 DIAGNOSIS — E118 Type 2 diabetes mellitus with unspecified complications: Secondary | ICD-10-CM | POA: Diagnosis not present

## 2022-07-30 DIAGNOSIS — R69 Illness, unspecified: Secondary | ICD-10-CM | POA: Diagnosis not present

## 2022-07-30 MED ORDER — SERTRALINE HCL 25 MG PO TABS: 25.0000 mg | ORAL_TABLET | Freq: Every day | ORAL | 3 refills | Status: DC

## 2022-07-30 NOTE — Assessment & Plan Note (Signed)
Chronic issue.  Check A1c.  Continue Trulicity A999333 mg weekly.

## 2022-07-30 NOTE — Assessment & Plan Note (Signed)
Chronic issue.  Well-controlled.  Continue amlodipine 10 mg daily and carvedilol 6.25 mg twice daily.

## 2022-07-30 NOTE — Progress Notes (Signed)
Tommi Rumps, MD Phone: [phone]  Crystal Miller is a 77 y.o. adult who presents today for f/u.  HYPERTENSION Disease Monitoring Home BP Monitoring similar to today Chest pain- no    Dyspnea- no Medications Compliance-  taking amlodipine, coreg.   Edema- no BMET    Component Value Date/Time   NA 136 01/17/2022 1451   NA 137 09/11/2019 1534   K 4.9 01/17/2022 1451   CL 104 01/17/2022 1451   CO2 24 01/17/2022 1451   GLUCOSE 103 (H) 01/17/2022 1451   BUN 31 (H) 01/17/2022 1451   BUN 26 09/11/2019 1534   CREATININE 1.43 (H) 01/17/2022 1451   CALCIUM 9.4 01/17/2022 1451   GFRNONAA 35 (L) 09/11/2019 1534   GFRAA 40 (L) 09/11/2019 1534   DIABETES Disease Monitoring: Blood Sugar ranges-130-150 Polyuria/phagia/dipsia- no      Optho- UTD Medications: Compliance- taking trulicity Hypoglycemic symptoms- no  Depression: Patient notes no depression.  She continues on Zoloft though she has been off of it the last week.   Social History   Tobacco Use  Smoking Status Former   Packs/day: 1.00   Years: 20.00   Total pack years: 20.00   Types: Cigarettes   Quit date: 2012   Years since quitting: 12.2  Smokeless Tobacco Never    Current Outpatient Medications on File Prior to Visit  Medication Sig Dispense Refill   acetaminophen (TYLENOL) 500 MG tablet Take 1,000 mg by mouth every 6 (six) hours as needed (for pain.).     amLODipine (NORVASC) 10 MG tablet Take 1 tablet by mouth once daily 90 tablet 0   aspirin EC 81 MG EC tablet Take 1 tablet (81 mg total) by mouth daily. 30 tablet 2   atorvastatin (LIPITOR) 40 MG tablet TAKE 1 TABLET BY MOUTH ONCE DAILY AT  6PM 90 tablet 3   blood glucose meter kit and supplies KIT Dispense based on patient and insurance preference. Use up to four times daily as directed. (FOR ICD-9 250.00, 250.01). 1 each 0   calcium carbonate (OS-CAL) 600 MG tablet Take 600 mg by mouth daily.     carvedilol (COREG) 6.25 MG tablet TAKE 1 TABLET BY MOUTH  TWICE DAILY WITH A MEAL 180 tablet 0   diclofenac Sodium (VOLTAREN) 1 % GEL Apply 2 g topically 4 (four) times daily.     Multiple Vitamin (MULTIVITAMIN WITH MINERALS) TABS tablet Take 1 tablet by mouth daily. Centrum Silver . In the morning     omeprazole (PRILOSEC) 20 MG capsule Take 1 capsule (20 mg total) by mouth daily. 90 capsule 4   oxybutynin (DITROPAN-XL) 5 MG 24 hr tablet      Polyethyl Glycol-Propyl Glycol 0.4-0.3 % SOLN Place 1 drop into both eyes 3 (three) times daily as needed (for dry eyes.).     TRULICITY [id number] [id number] SOPN INJECT 1 DOSE SUBCUTANEOUSLY ONCE A WEEK 12 mL 3   No current facility-administered medications on file prior to visit.     ROS see history of present illness  Objective  Physical Exam Vitals:   07/30/22 1612  BP: 124/74  Pulse: 73  Temp: 98.1 F (36.7 C)  SpO2: 96%    BP Readings from Last 3 Encounters:  07/30/22 124/74  05/10/22 132/62  03/16/22 90/60   Wt Readings from Last 3 Encounters:  07/30/22 139 lb 12.8 oz (63.4 kg)  05/10/22 132 lb (59.9 kg)  03/16/22 139 lb 3.2 oz (63.1 kg)    Physical Exam Constitutional:  General: He is not in acute distress.    Appearance: He is not diaphoretic.  Cardiovascular:     Rate and Rhythm: Normal rate and regular rhythm.     Heart sounds: Normal heart sounds.  Pulmonary:     Effort: Pulmonary effort is normal.     Breath sounds: Normal breath sounds.  Skin:    General: Skin is warm and dry.  Neurological:     Mental Status: He is alert.      Assessment/Plan: Please see individual problem list.  Mild episode of recurrent major depressive disorder ([place]) Assessment & Plan: Chronic issue.  Well-controlled.  She will continue Zoloft 25 mg daily.  Refill provided.  Orders: -     Sertraline HCl; Take 1 tablet (25 mg total) by mouth daily.  Dispense: 90 tablet; Refill: 3  Essential hypertension Assessment & Plan: Chronic issue.  Well-controlled.  Continue amlodipine 10 mg daily  and carvedilol 6.25 mg twice daily.  Orders: -     Comprehensive metabolic panel  DM (diabetes mellitus), type 2 with complications [facility]) Assessment & Plan: Chronic issue.  Check A1c.  Continue Trulicity A999333 mg weekly.  Orders: -     Hemoglobin A1c -     Microalbumin / creatinine urine ratio -     Comprehensive metabolic panel    Return in about 6 months (around 01/30/2023).   Tommi Rumps, MD [place]

## 2022-07-30 NOTE — Assessment & Plan Note (Signed)
Chronic issue.  Well-controlled.  She will continue Zoloft 25 mg daily.  Refill provided.

## 2022-07-31 LAB — MICROALBUMIN / CREATININE URINE RATIO
Creatinine,U: 109.1 mg/dL
Microalb Creat Ratio: 38.9 mg/g — ABNORMAL HIGH (ref 0.0–30.0)
Microalb, Ur: 42.4 mg/dL — ABNORMAL HIGH (ref 0.0–1.9)

## 2022-07-31 LAB — COMPREHENSIVE METABOLIC PANEL
ALT: 23 U/L (ref 0–35)
AST: 42 U/L — ABNORMAL HIGH (ref 0–37)
Albumin: 4.1 g/dL (ref 3.5–5.2)
Alkaline Phosphatase: 85 U/L (ref 39–117)
BUN: 27 mg/dL — ABNORMAL HIGH (ref 6–23)
CO2: 23 mEq/L (ref 19–32)
Calcium: 9.7 mg/dL (ref 8.4–10.5)
Chloride: 104 mEq/L (ref 96–112)
Creatinine, Ser: 1.25 mg/dL — ABNORMAL HIGH (ref 0.40–1.20)
GFR: 41.42 mL/min — ABNORMAL LOW (ref >60.00)
Glucose, Bld: 89 mg/dL (ref 70–99)
Potassium: 5.1 mEq/L (ref 3.5–5.1)
Sodium: 136 mEq/L (ref 135–145)
Total Bilirubin: 0.4 mg/dL (ref 0.2–1.2)
Total Protein: 7.7 g/dL (ref 6.0–8.3)

## 2022-07-31 LAB — HEMOGLOBIN A1C: Hgb A1c MFr Bld: 6.1 % (ref 4.6–6.5)

## 2022-08-01 ENCOUNTER — Other Ambulatory Visit: Payer: Self-pay | Admitting: Family Medicine

## 2022-08-01 DIAGNOSIS — R7989 Other specified abnormal findings of blood chemistry: Secondary | ICD-10-CM

## 2022-08-27 ENCOUNTER — Encounter (INDEPENDENT_AMBULATORY_CARE_PROVIDER_SITE_OTHER): Payer: Self-pay

## 2022-08-27 ENCOUNTER — Ambulatory Visit (INDEPENDENT_AMBULATORY_CARE_PROVIDER_SITE_OTHER): Payer: Medicare HMO | Admitting: Vascular Surgery

## 2022-08-27 ENCOUNTER — Ambulatory Visit (INDEPENDENT_AMBULATORY_CARE_PROVIDER_SITE_OTHER): Payer: Medicare HMO

## 2022-08-27 NOTE — Progress Notes (Incomplete)
MRN : [id number]  Crystal Miller is a 77 y.o. (06-15-1945) adult who presents with chief complaint of check circulation.  History of Present Illness: ***  No outpatient medications have been marked as taking for the 08/27/22 encounter (Appointment) with [name], [name], MD.    Past Medical History:  Diagnosis Date   Acute pyelonephritis 2018-08-20   Depression    husband died in 09-24-2015   DM (diabetes mellitus), 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   Essential hypertension 11/01/2016   GERD (gastroesophageal reflux disease)    Kidney stones    Mixed hyperlipidemia 11/01/2016   Peripheral vascular disease ([facility])    Renal insufficiency    Right pontine stroke (HCC) 03/27/2017   Stroke ([facility]CC) Sep 23, 2016   slight residual with speech. does not drive Lt side weakness    Past Surgical History:  Procedure Laterality Date   KIDNEY STONE SURGERY     LOWER EXTREMITY ANGIOGRAPHY Left 12/31/2017   Procedure: LOWER EXTREMITY ANGIOGRAPHY;  Surgeon: Renford Dills, MD;  Location: [facility] INVASIVE CV LAB;  Service: Cardiovascular;  Laterality: Left;   LOWER EXTREMITY ANGIOGRAPHY Right 04/22/2018   Procedure: LOWER EXTREMITY ANGIOGRAPHY;  Surgeon: Renford Dills, MD;  Location: [facility] INVASIVE CV LAB;  Service: Cardiovascular;  Laterality: Right;    Social History Social History   Tobacco Use   Smoking status: Former    Packs/day: 1.00    Years: 20.00    Additional pack years: 0.00    Total pack years: 20.00    Types: Cigarettes    Quit date: 2010-09-24    Years since quitting: 12.2   Smokeless tobacco: Never  Vaping Use   Vaping Use: Never used  Substance Use Topics   Alcohol use: No   Drug use: No    Family History Family History  Problem Relation Age of Onset   Alzheimer's disease Mother    Arthritis Mother    Hypertension Mother    CVA Father    CAD Father    Heart disease Father    Stroke Father    Hypertension Father    Arthritis Sister    Stroke  Brother    Diabetes Brother    Heart disease Brother    Bone cancer Brother    Lung cancer Brother    Diabetes Son    Breast cancer Neg Hx     Allergies  Allergen Reactions   Metformin And Related Diarrhea     REVIEW OF SYSTEMS (Negative unless checked)  Constitutional: [] Weight loss  [] Fever  [] Chills Cardiac: [] Chest pain   [] Chest pressure   [] Palpitations   [] Shortness of breath when laying flat   [] Shortness of breath with exertion. Vascular:  [x] Pain in legs with walking   [] Pain in legs at rest  [] History of DVT   [] Phlebitis   [] Swelling in legs   [] Varicose veins   [] Non-healing ulcers Pulmonary:   [] Uses home oxygen   [] Productive cough   [] Hemoptysis   [] Wheeze  [] COPD   [] Asthma Neurologic:  [] Dizziness   [] Seizures   [] History of stroke   [] History of TIA  [] Aphasia   [] Vissual changes   [] Weakness or numbness in arm   [] Weakness or numbness in leg Musculoskeletal:   [] Joint swelling   [] Joint pain   [] Low back pain Hematologic:  [] Easy bruising  [] Easy bleeding   [] Hypercoagulable state   [] Anemic Gastrointestinal:  [] Diarrhea   [] Vomiting  []   Gastroesophageal reflux/heartburn   [] Difficulty swallowing. Genitourinary:  [] Chronic kidney disease   [] Difficult urination  [] Frequent urination   [] Blood in urine Skin:  [] Rashes   [] Ulcers  Psychological:  [] History of anxiety   []  History of major depression.  Physical Examination  There were no vitals filed for this visit. There is no height or weight on file to calculate BMI. Gen: WD/WN, NAD Head: [place]/AT, No temporalis wasting.  Ear/Nose/Throat: Hearing grossly intact, nares w/o erythema or drainage Eyes: PER, EOMI, sclera nonicteric.  Neck: Supple, no masses.  No bruit or JVD.  Pulmonary:  Good air movement, no audible wheezing, no use of accessory muscles.  Cardiac: RRR, normal S1, S2, no Murmurs. Vascular:  mild trophic changes, no open wounds Vessel Right Left  Radial Palpable Palpable  PT Not Palpable Not  Palpable  DP Not Palpable Not Palpable  Gastrointestinal: soft, non-distended. No guarding/no peritoneal signs.  Musculoskeletal: M/S 5/5 throughout.  No visible deformity.  Neurologic: CN 2-12 intact. Pain and light touch intact in extremities.  Symmetrical.  Speech is fluent. Motor exam as listed above. Psychiatric: Judgment intact, Mood & affect appropriate for pt's clinical situation. Dermatologic: No rashes or ulcers noted.  No changes consistent with cellulitis.   CBC Lab Results  Component Value Date   WBC 7.5 01/17/2022   HGB 11.3 (L) 01/17/2022   HCT 33.4 (L) 01/17/2022   MCV 92.0 01/17/2022   PLT 295.0 01/17/2022    BMET    Component Value Date/Time   NA 136 07/30/2022 1629   NA 137 09/11/2019 1534   K 5.1 07/30/2022 1629   CL 104 07/30/2022 1629   CO2 23 07/30/2022 1629   GLUCOSE 89 07/30/2022 1629   BUN 27 (H) 07/30/2022 1629   BUN 26 09/11/2019 1534   CREATININE 1.25 (H) 07/30/2022 1629   CALCIUM 9.7 07/30/2022 1629   GFRNONAA 35 (L) 09/11/2019 1534   GFRAA 40 (L) 09/11/2019 1534   CrCl cannot be calculated (Patient's most recent lab result is older than the maximum 21 days allowed.).  COAG No results found for: "INR", "PROTIME"  Radiology No results found.   Assessment/Plan There are no diagnoses linked to this encounter.   Levora Dredge, MD  08/27/2022 2:51 PM

## 2022-09-03 ENCOUNTER — Encounter (INDEPENDENT_AMBULATORY_CARE_PROVIDER_SITE_OTHER): Payer: Self-pay | Admitting: Vascular Surgery

## 2022-09-03 ENCOUNTER — Ambulatory Visit (INDEPENDENT_AMBULATORY_CARE_PROVIDER_SITE_OTHER): Payer: Medicare HMO

## 2022-09-03 ENCOUNTER — Ambulatory Visit (INDEPENDENT_AMBULATORY_CARE_PROVIDER_SITE_OTHER): Payer: Medicare HMO | Admitting: Vascular Surgery

## 2022-09-03 VITALS — BP 148/74 | HR 69 | Resp 18 | Ht <= 58 in | Wt 138.6 lb

## 2022-09-03 DIAGNOSIS — E782 Mixed hyperlipidemia: Secondary | ICD-10-CM

## 2022-09-03 DIAGNOSIS — E118 Type 2 diabetes mellitus with unspecified complications: Secondary | ICD-10-CM | POA: Diagnosis not present

## 2022-09-03 DIAGNOSIS — I10 Essential (primary) hypertension: Secondary | ICD-10-CM

## 2022-09-03 DIAGNOSIS — I70213 Atherosclerosis of native arteries of extremities with intermittent claudication, bilateral legs: Secondary | ICD-10-CM

## 2022-09-03 NOTE — Progress Notes (Signed)
MRN : [id number]  Crystal Miller is a 77 y.o. (1946/02/13) adult who presents with chief complaint of check circulation.  History of Present Illness:   She returns to the office for followup and review status post angiogram with intervention on 04/22/2018.    1.  Percutaneous transluminal angioplasty right superficial femoral and popliteal arteries to 5 mm distally and 6 mm at the origin of the SFA 2.  Percutaneous transluminal angioplasty right posterior tibial to 2 mm in its proximal two thirds and then 3 mm from the origin into the tibioperoneal trunk 3.  Percutaneous transluminal angioplasty of the right profunda femoris to 4 mm proximally   The patient denies any changes in the lower extremity symptoms. No interval shortening of the patient's claudication distance or rest pain symptoms. Previous wounds have now healed.  No new ulcers or wounds have occurred since the last visit.  She does point out a small area of her left anterior shin that has a hard spot.  She states when it first happened it was a little painful but it is not at all painful now and it does seem to have gotten smaller with time.   There have been no significant changes to the patient's overall health care.   The patient denies amaurosis fugax or recent TIA symptoms. There are no recent neurological changes noted. The patient denies history of DVT, PE or superficial thrombophlebitis. The patient denies recent episodes of angina or shortness of breath.    ABI's Rt=0.95 and Lt=1.00  (previous ABI's Rt=1.07 and Lt=1.11)   Previous duplex ultrasound bilateral lower extremity arterial system demonstrates uniform velocities throughout the right and left lower extremities.  Incidental notation of a moderate stenosis in the right deep femoral.  Current Meds  Medication Sig   acetaminophen (TYLENOL) 500 MG tablet Take 1,000 mg by mouth every 6 (six) hours as needed (for pain.).   amLODipine (NORVASC) 10  MG tablet Take 1 tablet by mouth once daily   aspirin EC 81 MG EC tablet Take 1 tablet (81 mg total) by mouth daily.   atorvastatin (LIPITOR) 40 MG tablet TAKE 1 TABLET BY MOUTH ONCE DAILY AT  6PM   blood glucose meter kit and supplies KIT Dispense based on patient and insurance preference. Use up to four times daily as directed. (FOR ICD-9 250.00, 250.01).   calcium carbonate (OS-CAL) 600 MG tablet Take 600 mg by mouth daily.   carvedilol (COREG) 6.25 MG tablet TAKE 1 TABLET BY MOUTH TWICE DAILY WITH A MEAL   diclofenac Sodium (VOLTAREN) 1 % GEL Apply 2 g topically 4 (four) times daily.   Multiple Vitamin (MULTIVITAMIN WITH MINERALS) TABS tablet Take 1 tablet by mouth daily. Centrum Silver . In the morning   omeprazole (PRILOSEC) 20 MG capsule Take 1 capsule (20 mg total) by mouth daily.   oxybutynin (DITROPAN-XL) 5 MG 24 hr tablet    Polyethyl Glycol-Propyl Glycol 0.4-0.3 % SOLN Place 1 drop into both eyes 3 (three) times daily as needed (for dry eyes.).   sertraline (ZOLOFT) 25 MG tablet Take 1 tablet (25 mg total) by mouth daily.   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 does not drive Lt side weakness    Past Surgical History:  Procedure Laterality Date   KIDNEY STONE SURGERY     LOWER EXTREMITY ANGIOGRAPHY Left 12/31/2017   Procedure: LOWER EXTREMITY ANGIOGRAPHY;  Surgeon: Renford Dills, MD;  Location: [facility];  Service: Cardiovascular;  Laterality: Left;   LOWER EXTREMITY ANGIOGRAPHY Right  04/22/2018   Procedure: LOWER EXTREMITY ANGIOGRAPHY;  Surgeon: Renford Dills, MD;  Location: [facility];  Service: Cardiovascular;  Laterality: Right;    Social History Social History   Tobacco Use   Smoking status: Former    Packs/day: 1.00    Years: 20.00    Additional pack years: 0.00    Total pack years: 20.00    Types: Cigarettes    Quit date: 2012    Years since quitting: 12.2   Smokeless tobacco: Never  Vaping Use   Vaping Use: Never used  Substance Use Topics   Alcohol use: No   Drug use: No    Family History Family History  Problem Relation Age of Onset   Alzheimer's disease Mother    Arthritis Mother    Hypertension Mother    CVA Father    CAD Father    Heart disease Father    Stroke Father    Hypertension Father    Arthritis Sister    Stroke Brother    Diabetes Brother    Heart disease Brother    Bone cancer Brother    Lung cancer Brother    Diabetes Son    Breast cancer Neg Hx     Allergies  Allergen Reactions   Metformin And Related Diarrhea     REVIEW OF SYSTEMS (Negative unless checked)  Constitutional: [] Weight loss  [] Fever  [] Chills Cardiac: [] Chest pain   [] Chest pressure   [] Palpitations   [] Shortness of breath when laying flat   [] Shortness of breath with exertion. Vascular:  [x] Pain in legs with walking   [] Pain in legs at rest  [] History of DVT   [] Phlebitis   [] Swelling in legs   [] Varicose veins   [] Non-healing ulcers Pulmonary:   [] Uses home oxygen   [] Productive cough   [] Hemoptysis   [] Wheeze  [] COPD   [] Asthma Neurologic:  [] Dizziness   [] Seizures   [] History of stroke   [] History of TIA  [] Aphasia   [] Vissual changes   [] Weakness or numbness in arm   [] Weakness or numbness in leg Musculoskeletal:   [] Joint swelling   [] Joint pain   [] Low back pain Hematologic:  [] Easy bruising  [] Easy bleeding   [] Hypercoagulable state   [] Anemic Gastrointestinal:  [] Diarrhea   [] Vomiting  [] Gastroesophageal reflux/heartburn    [] Difficulty swallowing. Genitourinary:  [] Chronic kidney disease   [] Difficult urination  [] Frequent urination   [] Blood in urine Skin:  [] Rashes   [] Ulcers  Psychological:  [] History of anxiety   []  History of major depression.  Physical Examination  Vitals:   09/03/22 1500  BP: (!) 148/74  Pulse: 69  Resp: 18  Weight: 138 lb 9.6 oz (62.9 kg)  Height: 4\' 7"  (1.397 m)   Body mass index is 32.21 kg/m. Gen: WD/WN, NAD Head: [place]/AT, No temporalis wasting.  Ear/Nose/Throat: Hearing grossly intact, nares w/o erythema or  drainage Eyes: PER, EOMI, sclera nonicteric.  Neck: Supple, no masses.  No bruit or JVD.  Pulmonary:  Good air movement, no audible wheezing, no use of accessory muscles.  Cardiac: RRR, normal S1, S2, no Murmurs. Vascular:  mild trophic changes, no open wounds Vessel Right Left  Radial Palpable Palpable  PT Trace  Palpable Trace  Palpable  DP Not Palpable Not Palpable  Gastrointestinal: soft, non-distended. No guarding/no peritoneal signs.  Musculoskeletal: M/S 5/5 throughout.  No visible deformity.  Neurologic: CN 2-12 intact. Pain and light touch intact in extremities.  Symmetrical.  Speech is fluent. Motor exam as listed above. Psychiatric: Judgment intact, Mood & affect appropriate for pt's clinical situation. Dermatologic: No rashes or ulcers noted.  No changes consistent with cellulitis.   CBC Lab Results  Component Value Date   WBC 7.5 01/17/2022   HGB 11.3 (L) 01/17/2022   HCT 33.4 (L) 01/17/2022   MCV 92.0 01/17/2022   PLT 295.0 01/17/2022    BMET    Component Value Date/Time   NA 136 07/30/2022 1629   NA 137 09/11/2019 1534   K 5.1 07/30/2022 1629   CL 104 07/30/2022 1629   CO2 23 07/30/2022 1629   GLUCOSE 89 07/30/2022 1629   BUN 27 (H) 07/30/2022 1629   BUN 26 09/11/2019 1534   CREATININE 1.25 (H) 07/30/2022 1629   CALCIUM 9.7 07/30/2022 1629   GFRNONAA 35 (L) 09/11/2019 1534   GFRAA 40 (L) 09/11/2019 1534   CrCl cannot be  calculated (Patient's most recent lab result is older than the maximum 21 days allowed.).  COAG No results found for: "INR", "PROTIME"  Radiology No results found.   Assessment/Plan 1. Atherosclerosis of native artery of both lower extremities with intermittent claudication  Recommend:  The patient has evidence of atherosclerosis of the lower extremities with claudication.  The patient does not voice lifestyle limiting changes at this point in time.  Noninvasive studies do not suggest clinically significant change.  No invasive studies, angiography or surgery at this time The patient should continue walking and begin a more formal exercise program.  The patient should continue antiplatelet therapy and aggressive treatment of the lipid abnormalities  No changes in the patient's medications at this time  Continued surveillance is indicated as atherosclerosis is likely to progress with time.    The patient will continue follow up with noninvasive studies as ordered.  - VAS [place] ABI WITH/WO TBI; Future  2. Essential hypertension Continue antihypertensive medications as already ordered, these medications have been reviewed and there are no changes at this time.  3. DM (diabetes mellitus), type 2 with complications Continue hypoglycemic medications as already ordered, these medications have been reviewed and there are no changes at this time.  Hgb A1C to be monitored as already arranged by primary service  4. Mixed hyperlipidemia Continue statin as ordered and reviewed, no changes at this time    Levora Dredge, MD  09/03/2022 3:04 PM

## 2022-09-12 ENCOUNTER — Other Ambulatory Visit (INDEPENDENT_AMBULATORY_CARE_PROVIDER_SITE_OTHER): Payer: Medicare HMO

## 2022-09-12 DIAGNOSIS — R7989 Other specified abnormal findings of blood chemistry: Secondary | ICD-10-CM

## 2022-09-13 ENCOUNTER — Telehealth: Payer: Self-pay

## 2022-09-13 LAB — HEPATIC FUNCTION PANEL
ALT: 21 U/L (ref 0–35)
AST: 19 U/L (ref 0–37)
Albumin: 4.1 g/dL (ref 3.5–5.2)
Alkaline Phosphatase: 85 U/L (ref 39–117)
Bilirubin, Direct: 0.1 mg/dL (ref 0.0–0.3)
Total Bilirubin: 0.4 mg/dL (ref 0.2–1.2)
Total Protein: 7.6 g/dL (ref 6.0–8.3)

## 2022-09-13 NOTE — Telephone Encounter (Signed)
-----   Message from Glori Luis, MD sent at 09/13/2022  1:18 PM EDT ----- Please let the patient know her liver enzymes are acceptable.

## 2022-09-13 NOTE — Telephone Encounter (Signed)
Left message to call the office back regarding lab results  

## 2022-09-17 ENCOUNTER — Other Ambulatory Visit: Payer: Self-pay | Admitting: Family Medicine

## 2022-09-17 DIAGNOSIS — I10 Essential (primary) hypertension: Secondary | ICD-10-CM

## 2022-09-17 LAB — VAS US ABI WITH/WO TBI
Left ABI: 1
Right ABI: 0.95

## 2022-10-01 ENCOUNTER — Other Ambulatory Visit: Payer: Self-pay | Admitting: Family Medicine

## 2022-10-01 DIAGNOSIS — I10 Essential (primary) hypertension: Secondary | ICD-10-CM

## 2022-10-16 ENCOUNTER — Other Ambulatory Visit: Payer: Self-pay | Admitting: Family Medicine

## 2022-10-24 DIAGNOSIS — R808 Other proteinuria: Secondary | ICD-10-CM | POA: Diagnosis not present

## 2022-10-24 DIAGNOSIS — E1122 Type 2 diabetes mellitus with diabetic chronic kidney disease: Secondary | ICD-10-CM | POA: Diagnosis not present

## 2022-10-24 DIAGNOSIS — E875 Hyperkalemia: Secondary | ICD-10-CM | POA: Diagnosis not present

## 2022-10-24 DIAGNOSIS — I10 Essential (primary) hypertension: Secondary | ICD-10-CM | POA: Diagnosis not present

## 2022-10-24 DIAGNOSIS — N1832 Chronic kidney disease, stage 3b: Secondary | ICD-10-CM | POA: Diagnosis not present

## 2022-10-29 DIAGNOSIS — I10 Essential (primary) hypertension: Secondary | ICD-10-CM | POA: Diagnosis not present

## 2022-10-29 DIAGNOSIS — E1122 Type 2 diabetes mellitus with diabetic chronic kidney disease: Secondary | ICD-10-CM | POA: Diagnosis not present

## 2022-10-29 DIAGNOSIS — E875 Hyperkalemia: Secondary | ICD-10-CM | POA: Diagnosis not present

## 2022-10-29 DIAGNOSIS — N1832 Chronic kidney disease, stage 3b: Secondary | ICD-10-CM | POA: Diagnosis not present

## 2022-10-29 DIAGNOSIS — N2581 Secondary hyperparathyroidism of renal origin: Secondary | ICD-10-CM | POA: Diagnosis not present

## 2022-11-12 DIAGNOSIS — E1122 Type 2 diabetes mellitus with diabetic chronic kidney disease: Secondary | ICD-10-CM | POA: Diagnosis not present

## 2022-11-12 DIAGNOSIS — N2581 Secondary hyperparathyroidism of renal origin: Secondary | ICD-10-CM | POA: Diagnosis not present

## 2022-11-12 DIAGNOSIS — E875 Hyperkalemia: Secondary | ICD-10-CM | POA: Diagnosis not present

## 2022-11-12 DIAGNOSIS — I10 Essential (primary) hypertension: Secondary | ICD-10-CM | POA: Diagnosis not present

## 2022-11-12 DIAGNOSIS — N1832 Chronic kidney disease, stage 3b: Secondary | ICD-10-CM | POA: Diagnosis not present

## 2022-11-28 ENCOUNTER — Telehealth: Payer: Self-pay

## 2022-11-28 NOTE — Telephone Encounter (Signed)
Patient's daughter, Prentice Docker, states she is with patient right now and patient is experiencing cough, raspy voice, heavy breathing, can see chest moving up and down and her mouth hanging open (patient has had a stroke so this is not unusual, but it's more so today), chest congestion. Chrissy states it is taking patient longer to walk than usual and she has to sit down and rest while walking from the kitchen to the bedroom.  Chrissy states patient's mouth is dry.   I transferred call to Access Nurse.

## 2022-11-28 NOTE — Telephone Encounter (Signed)
Noted  

## 2022-11-28 NOTE — Telephone Encounter (Signed)
Please follow-up with the patient to make sure she goes to get evaluated in the emergency department for this.  It looks like they may have advised them to call EMS.

## 2022-11-28 NOTE — Telephone Encounter (Signed)
Patient advised to go to ED . 

## 2022-11-28 NOTE — Telephone Encounter (Signed)
Patient's daughter Crystal Miller is there with her and she did call EMS. EMS tried to take Patient to the ED but the Patient refused due to her vitals being good except the BP is a little elevated. I recommended going to the ED. Chrissy confirmed if she feels any worse or symptoms worsen or she has more symptoms they will be going to the ED. Chrissy is staying with Patient tonight.

## 2022-12-04 ENCOUNTER — Ambulatory Visit: Payer: Medicare HMO

## 2022-12-04 VITALS — Ht 59.0 in | Wt 138.0 lb

## 2022-12-04 DIAGNOSIS — Z0000 Encounter for general adult medical examination without abnormal findings: Secondary | ICD-10-CM | POA: Diagnosis not present

## 2022-12-04 NOTE — Patient Instructions (Signed)
Crystal Miller , Thank you for taking time to come for your Medicare Wellness Visit. I appreciate your ongoing commitment to your health goals. Please review the following plan we discussed and let me know if I can assist you in the future.   These are the goals we discussed:  Goals      DIET - EAT MORE FRUITS AND VEGETABLES     Maintain Healthy Lifestyle     Walk for exercise Low carb diet Stay hydrated        This is a list of the screening recommended for you and due dates:  Health Maintenance  Topic Date Due   Screening for Lung Cancer  Never done   Zoster (Shingles) Vaccine (1 of 2) Never done   COVID-19 Vaccine (5 - 2023-24 season) 01/19/2022   Flu Shot  12/20/2022   Complete foot exam   01/18/2023   Hemoglobin A1C  01/30/2023   Eye exam for diabetics  02/10/2023   Yearly kidney function blood test for diabetes  07/30/2023   Yearly kidney health urinalysis for diabetes  07/30/2023   Medicare Annual Wellness Visit  12/04/2023   DTaP/Tdap/Td vaccine (2 - Td or Tdap) 02/03/2031   Pneumonia Vaccine  Completed   DEXA scan (bone density measurement)  Completed   HPV Vaccine  Aged Out   Cologuard (Stool DNA test)  Discontinued    Advanced directives: no  Conditions/risks identified: none  Next appointment: Follow up in one year for your annual wellness visit 12/05/23 @ 10:15 am by phone   Preventive Care 65 Years and Older, Female Preventive care refers to lifestyle choices and visits with your health care provider that can promote health and wellness. What does preventive care include? A yearly physical exam. This is also called an annual well check. Dental exams once or twice a year. Routine eye exams. Ask your health care provider how often you should have your eyes checked. Personal lifestyle choices, including: Daily care of your teeth and gums. Regular physical activity. Eating a healthy diet. Avoiding tobacco and drug use. Limiting alcohol use. Practicing safe  sex. Taking low-dose aspirin every day. Taking vitamin and mineral supplements as recommended by your health care provider. What happens during an annual well check? The services and screenings done by your health care provider during your annual well check will depend on your age, overall health, lifestyle risk factors, and family history of disease. Counseling  Your health care provider may ask you questions about your: Alcohol use. Tobacco use. Drug use. Emotional well-being. Home and relationship well-being. Sexual activity. Eating habits. History of falls. Memory and ability to understand (cognition). Work and work Astronomer. Reproductive health. Screening  You may have the following tests or measurements: Height, weight, and BMI. Blood pressure. Lipid and cholesterol levels. These may be checked every 5 years, or more frequently if you are over 20 years old. Skin check. Lung cancer screening. You may have this screening every year starting at age 7 if you have a 30-pack-year history of smoking and currently smoke or have quit within the past 15 years. Fecal occult blood test (FOBT) of the stool. You may have this test every year starting at age 48. Flexible sigmoidoscopy or colonoscopy. You may have a sigmoidoscopy every 5 years or a colonoscopy every 10 years starting at age 73. Hepatitis C blood test. Hepatitis B blood test. Sexually transmitted disease (STD) testing. Diabetes screening. This is done by checking your blood sugar (glucose) after you have  not eaten for a while (fasting). You may have this done every 1-3 years. Bone density scan. This is done to screen for osteoporosis. You may have this done starting at age 52. Mammogram. This may be done every 1-2 years. Talk to your health care provider about how often you should have regular mammograms. Talk with your health care provider about your test results, treatment options, and if necessary, the need for more  tests. Vaccines  Your health care provider may recommend certain vaccines, such as: Influenza vaccine. This is recommended every year. Tetanus, diphtheria, and acellular pertussis (Tdap, Td) vaccine. You may need a Td booster every 10 years. Zoster vaccine. You may need this after age 50. Pneumococcal 13-valent conjugate (PCV13) vaccine. One dose is recommended after age 34. Pneumococcal polysaccharide (PPSV23) vaccine. One dose is recommended after age 41. Talk to your health care provider about which screenings and vaccines you need and how often you need them. This information is not intended to replace advice given to you by your health care provider. Make sure you discuss any questions you have with your health care provider. Document Released: 06/03/2015 Document Revised: 01/25/2016 Document Reviewed: 03/08/2015 Elsevier Interactive Patient Education  2017 [employer].  Fall Prevention in the Home Falls can cause injuries. They can happen to people of all ages. There are many things you can do to make your home safe and to help prevent falls. What can I do on the outside of my home? Regularly fix the edges of walkways and driveways and fix any cracks. Remove anything that might make you trip as you walk through a door, such as a raised step or threshold. Trim any bushes or trees on the path to your home. Use bright outdoor lighting. Clear any walking paths of anything that might make someone trip, such as rocks or tools. Regularly check to see if handrails are loose or broken. Make sure that both sides of any steps have handrails. Any raised decks and porches should have guardrails on the edges. Have any leaves, snow, or ice cleared regularly. Use sand or salt on walking paths during winter. Clean up any spills in your garage right away. This includes oil or grease spills. What can I do in the bathroom? Use night lights. Install grab bars by the toilet and in the tub and shower.  Do not use towel bars as grab bars. Use non-skid mats or decals in the tub or shower. If you need to sit down in the shower, use a plastic, non-slip stool. Keep the floor dry. Clean up any water that spills on the floor as soon as it happens. Remove soap buildup in the tub or shower regularly. Attach bath mats securely with double-sided non-slip rug tape. Do not have throw rugs and other things on the floor that can make you trip. What can I do in the bedroom? Use night lights. Make sure that you have a light by your bed that is easy to reach. Do not use any sheets or blankets that are too big for your bed. They should not hang down onto the floor. Have a firm chair that has side arms. You can use this for support while you get dressed. Do not have throw rugs and other things on the floor that can make you trip. What can I do in the kitchen? Clean up any spills right away. Avoid walking on wet floors. Keep items that you use a lot in easy-to-reach places. If you need to  reach something above you, use a strong step stool that has a grab bar. Keep electrical cords out of the way. Do not use floor polish or wax that makes floors slippery. If you must use wax, use non-skid floor wax. Do not have throw rugs and other things on the floor that can make you trip. What can I do with my stairs? Do not leave any items on the stairs. Make sure that there are handrails on both sides of the stairs and use them. Fix handrails that are broken or loose. Make sure that handrails are as long as the stairways. Check any carpeting to make sure that it is firmly attached to the stairs. Fix any carpet that is loose or worn. Avoid having throw rugs at the top or bottom of the stairs. If you do have throw rugs, attach them to the floor with carpet tape. Make sure that you have a light switch at the top of the stairs and the bottom of the stairs. If you do not have them, ask someone to add them for you. What else  can I do to help prevent falls? Wear shoes that: Do not have high heels. Have rubber bottoms. Are comfortable and fit you well. Are closed at the toe. Do not wear sandals. If you use a stepladder: Make sure that it is fully opened. Do not climb a closed stepladder. Make sure that both sides of the stepladder are locked into place. Ask someone to hold it for you, if possible. Clearly mark and make sure that you can see: Any grab bars or handrails. First and last steps. Where the edge of each step is. Use tools that help you move around (mobility aids) if they are needed. These include: Canes. Walkers. Scooters. Crutches. Turn on the lights when you go into a dark area. Replace any light bulbs as soon as they burn out. Set up your furniture so you have a clear path. Avoid moving your furniture around. If any of your floors are uneven, fix them. If there are any pets around you, be aware of where they are. Review your medicines with your doctor. Some medicines can make you feel dizzy. This can increase your chance of falling. Ask your doctor what other things that you can do to help prevent falls. This information is not intended to replace advice given to you by your health care provider. Make sure you discuss any questions you have with your health care provider. Document Released: 03/03/2009 Document Revised: 10/13/2015 Document Reviewed: 06/11/2014 Elsevier Interactive Patient Education  2017 [employer].

## 2022-12-04 NOTE — Progress Notes (Signed)
Subjective:   Crystal Miller is a 77 y.o. female who presents for Medicare Annual (Subsequent) preventive examination.  Per patient no change in vitals since last visit, unable to obtain new vitals due to telehealth visit   Visit Complete: Virtual  I connected with  Crystal Miller on 12/04/22 by a audio enabled telemedicine application and verified that I am speaking with the correct person using two identifiers.  Patient Location: Home  Provider Location: Office/Clinic  I discussed the limitations of evaluation and management by telemedicine. The patient expressed understanding and agreed to proceed.  Review of Systems     Cardiac Risk Factors include: advanced age (>62men, >29 women);hypertension;diabetes mellitus     Objective:    Today's Vitals   12/04/22 1349  Weight: 138 lb (62.6 kg)  Height: 4\' 11"  (1.499 m)   Body mass index is 27.87 kg/m.     12/04/2022    1:39 PM 11/30/2021    2:06 PM 11/29/2020    3:00 PM 11/24/2019   12:04 PM 11/20/2018   11:50 AM 08/01/2018    5:00 PM 08/01/2018   11:46 AM  Advanced Directives  Does Patient Have a Medical Advance Directive? No Yes Yes Yes Yes Yes Yes  Type of Advance Directive  Out of facility DNR (pink MOST or yellow form) Healthcare Power of [place];Living will Out of facility DNR (pink MOST or yellow form) Out of facility DNR (pink MOST or yellow form) Out of facility DNR (pink MOST or yellow form) Out of facility DNR (pink MOST or yellow form)  Does patient want to make changes to medical advance directive?  No - Patient declined No - Patient declined No - Patient declined No - Patient declined No - Patient declined No - Patient declined  Copy of Healthcare Power of Attorney in Chart?   Yes - validated most recent copy scanned in chart (See row information)      Would patient like information on creating a medical advance directive? No - Patient declined        Pre-existing out of facility DNR order (yellow form or pink MOST  form)      Yellow form placed in chart (order not valid for inpatient use) Yellow form placed in chart (order not valid for inpatient use)    Current Medications (verified) Outpatient Encounter Medications as of 12/04/2022  Medication Sig   acetaminophen (TYLENOL) 500 MG tablet Take 1,000 mg by mouth every 6 (six) hours as needed (for pain.).   amLODipine (NORVASC) 10 MG tablet Take 1 tablet by mouth once daily   aspirin EC 81 MG EC tablet Take 1 tablet (81 mg total) by mouth daily.   atorvastatin (LIPITOR) 40 MG tablet TAKE 1 TABLET BY MOUTH ONCE DAILY AT  6PM   blood glucose meter kit and supplies KIT Dispense based on patient and insurance preference. Use up to four times daily as directed. (FOR ICD-9 250.00, 250.01).   calcium carbonate (OS-CAL) 600 MG tablet Take 600 mg by mouth daily.   carvedilol (COREG) 6.25 MG tablet TAKE 1 TABLET BY MOUTH TWICE DAILY WITH A MEAL   diclofenac Sodium (VOLTAREN) 1 % GEL Apply 2 g topically 4 (four) times daily.   Multiple Vitamin (MULTIVITAMIN WITH MINERALS) TABS tablet Take 1 tablet by mouth daily. Centrum Silver . In the morning   omeprazole (PRILOSEC) 20 MG capsule Take 1 capsule (20 mg total) by mouth daily.   oxybutynin (DITROPAN-XL) 5 MG 24 hr tablet    Polyethyl  Glycol-Propyl Glycol 0.4-0.3 % SOLN Place 1 drop into both eyes 3 (three) times daily as needed (for dry eyes.).   sertraline (ZOLOFT) 25 MG tablet Take 1 tablet (25 mg total) by mouth daily.   TRULICITY 0.75 MG/0.5ML SOPN INJECT 1 DOSE SUBCUTANEOUSLY ONCE A WEEK   No facility-administered encounter medications on file as of 12/04/2022.    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 does not drive Lt side weakness   Past Surgical History:  Procedure Laterality Date   KIDNEY STONE SURGERY     LOWER EXTREMITY ANGIOGRAPHY Left 12/31/2017   Procedure: LOWER EXTREMITY ANGIOGRAPHY;  Surgeon: Renford Dills, MD;  Location: [facility] INVASIVE CV LAB;  Service: Cardiovascular;  Laterality: Left;   LOWER EXTREMITY ANGIOGRAPHY Right 04/22/2018   Procedure: LOWER EXTREMITY ANGIOGRAPHY;  Surgeon: Renford Dills, MD;  Location: [facility] INVASIVE CV LAB;  Service: Cardiovascular;  Laterality: Right;   Family History  Problem Relation Age of Onset   Alzheimer's disease Mother    Arthritis Mother    Hypertension Mother    CVA Father    CAD Father    Heart disease Father    Stroke Father    Hypertension Father    Arthritis Sister    Stroke Brother    Diabetes Brother    Heart disease Brother    Bone cancer Brother    Lung cancer Brother    Diabetes Son    Breast cancer Neg Hx    Social History   Socioeconomic History   Marital status: Widowed    Spouse name: Not on file   Number of children: 3   Years of education: Not on file   Highest education level: Not on file  Occupational History   Occupation: Higher education careers adviser    Comment: retired   Occupation: retired  Tobacco Use   Smoking status: Former    Current packs/day: 0.00    Average packs/day: 1 pack/day for 20.0 years (20.0 ttl pk-yrs)    Types: Cigarettes    Start date: 63    Quit date: Dec 25, 2010    Years since quitting: 12.5   Smokeless tobacco: Never  Vaping Use   Vaping status: Never Used  Substance and Sexual Activity   Alcohol use: No   Drug use: No   Sexual activity: Not Currently    Birth control/protection: Post-menopausal  Other Topics Concern   Not on file  Social History Narrative   Not on file   Social  Determinants of Health   Financial Resource Strain: Low Risk  (12/04/2022)   Overall Financial Resource Strain (CARDIA)    Difficulty of Paying Living Expenses: Not hard at all  Food Insecurity: No Food Insecurity (12/04/2022)   Hunger Vital Sign    Worried About Running Out of Food in the Last Year: Never true    Ran Out of Food in the Last Year: Never true  Transportation Needs: No Transportation Needs (12/04/2022)   PRAPARE - Transportation    Lack of  Transportation (Medical): No    Lack of Transportation (Non-Medical): No  Physical Activity: Sufficiently Active (12/04/2022)   Exercise Vital Sign    Days of Exercise per Week: 7 days    Minutes of Exercise per Session: 30 min  Stress: No Stress Concern Present (12/04/2022)   [employer] of Occupational Health - Occupational Stress Questionnaire    Feeling of Stress : Not at all  Social Connections: Moderately Integrated (12/04/2022)   Social Connection and Isolation Panel [NHANES]    Frequency of Communication with Friends and Family: More than three times a week    Frequency of Social Gatherings with Friends and Family: More than three times a week    Attends Religious Services: More than 4 times per year    Active Member of [employer] or Organizations: Yes    Attends Banker Meetings: 1 to 4 times per year    Marital Status: Widowed    Tobacco Counseling Counseling given: Not Answered   Clinical Intake:  Pre-visit preparation completed: Yes  Pain : No/denies pain     Nutritional Risks: None Diabetes: Yes CBG done?: No Did pt. bring in CBG monitor from home?: No  How often do you need to have someone help you when you read instructions, pamphlets, or other written materials from your doctor or pharmacy?: 1 - Never  Interpreter Needed?: No  Information entered by :: Kennedy Bucker, LPN   Activities of Daily Living    12/04/2022    1:40 PM  In your present state of health, do you have any difficulty  performing the following activities:  Hearing? 0  Vision? 0  Difficulty concentrating or making decisions? 0  Walking or climbing stairs? 0  Dressing or bathing? 0  Doing errands, shopping? 0  Preparing Food and eating ? N  Using the Toilet? N  In the past six months, have you accidently leaked urine? N  Do you have problems with loss of bowel control? N  Managing your Medications? N  Managing your Finances? N  Housekeeping or managing your Housekeeping? N    Patient Care Team: Glori Luis, MD as PCP - General (Family Medicine)  Indicate any recent Medical Services you may have received from other than [facility] providers in the past year (date may be approximate).     Assessment:   This is a routine wellness examination for [place].  Hearing/Vision screen Hearing Screening - Comments:: No aids Vision Screening - Comments:: Wears glasses- [facility]  Dietary issues and exercise activities discussed:     Goals Addressed             This Visit's Progress    DIET - EAT MORE FRUITS AND VEGETABLES         Depression Screen    12/04/2022    1:37 PM 07/30/2022    4:14 PM 03/16/2022    3:28 PM 11/30/2021    1:44 PM 07/17/2021    1:27 PM 11/29/2020    2:58 PM 08/29/2020    1:35 PM  PHQ 2/9 Scores  PHQ - 2 Score 2 1 0 0 0 0 0  PHQ- 9 Score 4 2       Exception Documentation Patient refusal          Fall Risk    12/04/2022    1:40 PM 07/30/2022    4:14 PM 03/16/2022    3:28 PM 11/30/2021    1:44 PM 07/17/2021    1:27 PM  Fall Risk  Falls in the past year? 1 0 0 0 0  Number falls in past yr: 0 0 0 0 0  Injury with Fall? 0 0 0  0  Risk for fall due to : Impaired balance/gait;History of fall(s) No Fall Risks No Fall Risks  No Fall Risks  Follow up Falls prevention discussed;Falls evaluation completed Falls evaluation completed Falls evaluation completed Falls evaluation completed Falls evaluation completed    MEDICARE RISK AT HOME:  Medicare Risk at Home -  12/04/22 1340     Any stairs in or around the home? No    If so, are there any without handrails? No    Home free of loose throw rugs in walkways, pet beds, electrical cords, etc? Yes    Adequate lighting in your home to reduce risk of falls? Yes    Life alert? No    Use of a cane, walker or w/c? Yes   uses a rollator   Grab bars in the bathroom? Yes    Shower chair or bench in shower? Yes    Elevated toilet seat or a handicapped toilet? Yes             TIMED UP AND GO:  Was the test performed?  No    Cognitive Function:        12/04/2022    1:41 PM 11/30/2021    2:08 PM 11/24/2019   11:45 AM 11/20/2018   11:54 AM 10/30/2017   11:39 AM  6CIT Screen  What Year? 0 points 0 points 0 points 0 points 0 points  What month? 0 points 0 points 0 points 0 points 0 points  What time? 0 points 0 points  0 points 0 points  Count back from 20 0 points 0 points 0 points 0 points 0 points  Months in reverse 0 points 0 points 0 points 0 points 0 points  Repeat phrase 0 points 0 points 0 points 0 points 0 points  Total Score 0 points 0 points  0 points 0 points    Immunizations Immunization History  Administered Date(s) Administered   Fluad Quad(high Dose 65+) 02/26/2019, 01/27/2020, 07/17/2021, 03/16/2022   Influenza, High Dose Seasonal PF 02/19/2018   PFIZER(Purple Top)SARS-COV-2 Vaccination 07/11/2019, 08/04/2019, 03/22/2020   Pfizer Covid-19 Vaccine Bivalent Booster 59yrs & up 02/02/2021   Pneumococcal Conjugate-13 10/30/2017   Pneumococcal Polysaccharide-23 10/02/2016   Tdap 02/02/2021    TDAP status: Up to date  Flu Vaccine status: Up to date  Pneumococcal vaccine status: Up to date  Covid-19 vaccine status: Completed vaccines  Qualifies for Shingles Vaccine? Yes   Zostavax completed No   Shingrix Completed?: No.    Education has been provided regarding the importance of this vaccine. Patient has been advised to call insurance company to determine out of pocket expense if  they have not yet received this vaccine. Advised may also receive vaccine at local pharmacy or Health Dept. Verbalized acceptance and understanding.  Screening Tests Health Maintenance  Topic Date Due   Lung Cancer Screening  Never done   Zoster Vaccines- Shingrix (1 of 2) Never done   COVID-19 Vaccine (5 - 2023-24 season) 01/19/2022   INFLUENZA VACCINE  12/20/2022   FOOT EXAM  01/18/2023   HEMOGLOBIN A1C  01/30/2023   OPHTHALMOLOGY EXAM  02/10/2023   Diabetic kidney evaluation - eGFR measurement  07/30/2023   Diabetic kidney evaluation - Urine ACR  07/30/2023   Medicare Annual Wellness (AWV)  12/04/2023   DTaP/Tdap/Td (2 - Td or  Tdap) 02/03/2031   Pneumonia Vaccine 1+ Years old  Completed   DEXA SCAN  Completed   HPV VACCINES  Aged Out   Fecal DNA (Cologuard)  Discontinued    Health Maintenance  Health Maintenance Due  Topic Date Due   Lung Cancer Screening  Never done   Zoster Vaccines- Shingrix (1 of 2) Never done   COVID-19 Vaccine (5 - 2023-24 season) 01/19/2022    Colorectal cancer screening: No longer required.   Mammogram status: No longer required due to age.  Bone Density status: Completed 11/07/16. Results reflect: Bone density results: OSTEOPOROSIS. Repeat every 2 years.- DECLINED REFERRAL  Lung Cancer Screening: (Low Dose CT Chest recommended if Age 30-80 years, 20 pack-year currently smoking OR have quit w/in 15years.) does not qualify.    Additional Screening:  Hepatitis C Screening: does qualify; Completed NO  Vision Screening: Recommended annual ophthalmology exams for early detection of glaucoma and other disorders of the eye. Is the patient up to date with their annual eye exam?  Yes  Who is the provider or what is the name of the office in which the patient attends annual eye exams? [facility] If pt is not established with a provider, would they like to be referred to a provider to establish care? No .   Dental Screening: Recommended annual  dental exams for proper oral hygiene  Diabetic Foot Exam: Diabetic Foot Exam: Completed 01/17/22  Community Resource Referral / Chronic Care Management: CRR required this visit?  No   CCM required this visit?  No     Plan:     I have personally reviewed and noted the following in the patient's chart:   Medical and social history Use of alcohol, tobacco or illicit drugs  Current medications and supplements including opioid prescriptions. Patient is not currently taking opioid prescriptions. Functional ability and status Nutritional status Physical activity Advanced directives List of other physicians Hospitalizations, surgeries, and ER visits in previous 12 months Vitals Screenings to include cognitive, depression, and falls Referrals and appointments  In addition, I have reviewed and discussed with patient certain preventive protocols, quality metrics, and best practice recommendations. A written personalized care plan for preventive services as well as general preventive health recommendations were provided to patient.     Hal Hope, LPN   [id number]   After Visit Summary: (MyChart) Due to this being a telephonic visit, the after visit summary with patients personalized plan was offered to patient via MyChart   Nurse Notes: NONE

## 2022-12-10 ENCOUNTER — Telehealth: Payer: Self-pay | Admitting: *Deleted

## 2022-12-10 ENCOUNTER — Ambulatory Visit (INDEPENDENT_AMBULATORY_CARE_PROVIDER_SITE_OTHER): Payer: Medicare HMO | Admitting: Family Medicine

## 2022-12-10 ENCOUNTER — Ambulatory Visit
Admission: RE | Admit: 2022-12-10 | Discharge: 2022-12-10 | Disposition: A | Discharge status: Home / Self Care | Payer: Medicare HMO | Attending: Family Medicine | Admitting: Family Medicine

## 2022-12-10 ENCOUNTER — Encounter: Payer: Self-pay | Admitting: Family Medicine

## 2022-12-10 ENCOUNTER — Ambulatory Visit
Admission: RE | Admit: 2022-12-10 | Discharge: 2022-12-10 | Disposition: A | Discharge status: Home / Self Care | Payer: Medicare HMO | Source: Ambulatory Visit | Attending: Family Medicine | Admitting: Family Medicine

## 2022-12-10 ENCOUNTER — Telehealth: Payer: Self-pay | Admitting: Family Medicine

## 2022-12-10 VITALS — BP 118/72 | HR 85 | Temp 98.7°F | Ht 59.0 in | Wt 134.8 lb

## 2022-12-10 DIAGNOSIS — D649 Anemia, unspecified: Secondary | ICD-10-CM | POA: Diagnosis not present

## 2022-12-10 DIAGNOSIS — R2689 Other abnormalities of gait and mobility: Secondary | ICD-10-CM

## 2022-12-10 DIAGNOSIS — I10 Essential (primary) hypertension: Secondary | ICD-10-CM | POA: Diagnosis not present

## 2022-12-10 DIAGNOSIS — U071 COVID-19: Secondary | ICD-10-CM | POA: Diagnosis not present

## 2022-12-10 DIAGNOSIS — J189 Pneumonia, unspecified organism: Secondary | ICD-10-CM | POA: Diagnosis not present

## 2022-12-10 DIAGNOSIS — R069 Unspecified abnormalities of breathing: Secondary | ICD-10-CM | POA: Diagnosis not present

## 2022-12-10 DIAGNOSIS — I700 Atherosclerosis of aorta: Secondary | ICD-10-CM | POA: Diagnosis not present

## 2022-12-10 LAB — COMPREHENSIVE METABOLIC PANEL
ALT: 40 U/L — ABNORMAL HIGH (ref 0–35)
AST: 45 U/L — ABNORMAL HIGH (ref 0–37)
Albumin: 3.9 g/dL (ref 3.5–5.2)
Alkaline Phosphatase: 93 U/L (ref 39–117)
BUN: 34 mg/dL — ABNORMAL HIGH (ref 6–23)
CO2: 19 mEq/L (ref 19–32)
Calcium: 9.1 mg/dL (ref 8.4–10.5)
Chloride: 105 mEq/L (ref 96–112)
Creatinine, Ser: 1.62 mg/dL — ABNORMAL HIGH (ref 0.40–1.20)
GFR: 30.27 mL/min — ABNORMAL LOW (ref >60.00)
Glucose, Bld: 178 mg/dL — ABNORMAL HIGH (ref 70–99)
Potassium: 4.8 mEq/L (ref 3.5–5.1)
Sodium: 134 mEq/L — ABNORMAL LOW (ref 135–145)
Total Bilirubin: 0.5 mg/dL (ref 0.2–1.2)
Total Protein: 7.5 g/dL (ref 6.0–8.3)

## 2022-12-10 LAB — IBC + FERRITIN
Ferritin: 73.9 ng/mL (ref 10.0–291.0)
Iron: 14 ug/dL — ABNORMAL LOW (ref 42–145)
Saturation Ratios: 4.2 % — ABNORMAL LOW (ref 20.0–50.0)
TIBC: 331.8 ug/dL (ref 250.0–450.0)
Transferrin: 237 mg/dL (ref 212.0–360.0)

## 2022-12-10 LAB — CBC
HCT: 34.5 % — ABNORMAL LOW (ref 36.0–46.0)
Hemoglobin: 11 g/dL — ABNORMAL LOW (ref 12.0–15.0)
MCHC: 32 g/dL (ref 30.0–36.0)
MCV: 93.1 fl (ref 78.0–100.0)
Platelets: 384 10*3/uL (ref 150.0–400.0)
RBC: 3.71 Mil/uL — ABNORMAL LOW (ref 3.87–5.11)
RDW: 12.6 % (ref 11.5–15.5)
WBC: 33 10*3/uL (ref 4.0–10.5)

## 2022-12-10 MED ORDER — AMLODIPINE BESYLATE 10 MG PO TABS: 10.0000 mg | ORAL_TABLET | Freq: Every day | ORAL | 0 refills | Status: DC

## 2022-12-10 MED ORDER — AMOXICILLIN-POT CLAVULANATE 875-125 MG PO TABS: 1.0000 | ORAL_TABLET | Freq: Two times a day (BID) | ORAL | 0 refills | Status: DC

## 2022-12-10 MED ORDER — DOXYCYCLINE HYCLATE 100 MG PO TABS: 100.0000 mg | ORAL_TABLET | Freq: Two times a day (BID) | ORAL | 0 refills | Status: DC

## 2022-12-10 MED ORDER — CARVEDILOL 6.25 MG PO TABS: 6.2500 mg | ORAL_TABLET | Freq: Two times a day (BID) | ORAL | 0 refills | Status: DC

## 2022-12-10 NOTE — Telephone Encounter (Signed)
Spoke to Patient's daughter and she understands and is agreeable. Patient is scheduled for a virtual covid follow up on 12/14/22 at 4:30.

## 2022-12-10 NOTE — Progress Notes (Signed)
Marikay Alar, MD Phone: [phone]  Crystal Miller is a 77 y.o. adult who presents today for same-day visit.  COVID-19: Patient notes onset of symptoms on 12/04/2022.  She has had chills, some cough, chest congestion, worsened reflux, and fatigue.  She notes no shortness of breath though her daughter reports some shortness of breath has been noticed.  No postnasal drip.  She has not been taking any medications for this.  Social History   Tobacco Use  Smoking Status Former   Current packs/day: 0.00   Average packs/day: 1 pack/day for 20.0 years (20.0 ttl pk-yrs)   Types: Cigarettes   Start date: 7   Quit date: 2012   Years since quitting: 12.5  Smokeless Tobacco Never    Current Outpatient Medications on File Prior to Visit  Medication Sig Dispense Refill   acetaminophen (TYLENOL) 500 MG tablet Take 1,000 mg by mouth every 6 (six) hours as needed (for pain.).     aspirin EC 81 MG EC tablet Take 1 tablet (81 mg total) by mouth daily. 30 tablet 2   atorvastatin (LIPITOR) 40 MG tablet TAKE 1 TABLET BY MOUTH ONCE DAILY AT  6PM 90 tablet 3   blood glucose meter kit and supplies KIT Dispense based on patient and insurance preference. Use up to four times daily as directed. (FOR ICD-9 250.00, 250.01). 1 each 0   calcium carbonate (OS-CAL) 600 MG tablet Take 600 mg by mouth daily.     diclofenac Sodium (VOLTAREN) 1 % GEL Apply 2 g topically 4 (four) times daily.     Multiple Vitamin (MULTIVITAMIN WITH MINERALS) TABS tablet Take 1 tablet by mouth daily. Centrum Silver . In the morning     omeprazole (PRILOSEC) 20 MG capsule Take 1 capsule (20 mg total) by mouth daily. 90 capsule 4   oxybutynin (DITROPAN-XL) 5 MG 24 hr tablet      Polyethyl Glycol-Propyl Glycol 0.4-0.3 % SOLN Place 1 drop into both eyes 3 (three) times daily as needed (for dry eyes.).     sertraline (ZOLOFT) 25 MG tablet Take 1 tablet (25 mg total) by mouth daily. 90 tablet 3   TRULICITY 0.75 MG/0.5ML SOPN INJECT 1 DOSE  SUBCUTANEOUSLY ONCE A WEEK 12 mL 3   No current facility-administered medications on file prior to visit.     ROS see history of present illness  Objective  Physical Exam Vitals:   12/10/22 1213  BP: 118/72  Pulse: 85  Temp: 98.7 F (37.1 C)  SpO2: 95%    BP Readings from Last 3 Encounters:  12/10/22 118/72  09/03/22 (!) 148/74  07/30/22 124/74   Wt Readings from Last 3 Encounters:  12/10/22 134 lb 12.8 oz (61.1 kg)  12/04/22 138 lb (62.6 kg)  09/03/22 138 lb 9.6 oz (62.9 kg)    Physical Exam Constitutional:      General: He is not in acute distress.    Appearance: He is not diaphoretic.  Cardiovascular:     Rate and Rhythm: Normal rate and regular rhythm.     Heart sounds: Normal heart sounds.  Pulmonary:     Effort: Pulmonary effort is normal.     Comments: Left lower lung field with coarse breath sounds/crackles Skin:    General: Skin is warm and dry.  Neurological:     Mental Status: He is alert.      Assessment/Plan: Please see individual problem list.  COVID Assessment & Plan: Patient with COVID-19.  She is outside of treatment window for Paxlovid.  It sounds that she could have a left lower lobe pneumonia on exam.  We will treat with Augmentin and doxycycline.  Advised to seek medical attention if developing shortness of breath or cough productive of blood.  Will send for chest x-ray and have her complete lab work today to evaluate for other underlying causes of her symptoms.  CMA will contact patient with quarantine precautions as those were not relayed to the patient prior to her leaving.  Orders: -     CBC  Essential hypertension -     amLODIPine Besylate; Take 1 tablet (10 mg total) by mouth daily.  Dispense: 90 tablet; Refill: 0  Balance problem -     Comprehensive metabolic panel -     CBC  Anemia, unspecified type -     IBC + Ferritin  Community acquired pneumonia of left lower lobe of lung -     Amoxicillin-Pot Clavulanate; Take 1  tablet by mouth 2 (two) times daily.  Dispense: 14 tablet; Refill: 0 -     Doxycycline Hyclate; Take 1 tablet (100 mg total) by mouth 2 (two) times daily.  Dispense: 14 tablet; Refill: 0 -     DG Chest 2 View; Future  Other orders -     Carvedilol; Take 1 tablet (6.25 mg total) by mouth 2 (two) times daily with a meal.  Dispense: 180 tablet; Refill: 0   Return in about 3 days (around 12/13/2022).   Marikay Alar, MD [facility]

## 2022-12-10 NOTE — Assessment & Plan Note (Signed)
Patient with COVID-19.  She is outside of treatment window for Paxlovid.  It sounds that she could have a left lower lobe pneumonia on exam.  We will treat with Augmentin and doxycycline.  Advised to seek medical attention if developing shortness of breath or cough productive of blood.  Will send for chest x-ray and have her complete lab work today to evaluate for other underlying causes of her symptoms.  CMA will contact patient with quarantine precautions as those were not relayed to the patient prior to her leaving.

## 2022-12-10 NOTE — Telephone Encounter (Signed)
CRITICAL VALUE STICKER  CRITICAL VALUE: [id number]  RECEIVER (on-site recipient of call): Silvestre Moment, CMA  DATE & TIME NOTIFIED: 12/10/22 @ 3:05pm  MESSENGER (representative from lab): Allene Pyo  MD NOTIFIED: Dr. Birdie Sons  TIME OF NOTIFICATION: 3:14pm  RESPONSE:

## 2022-12-10 NOTE — Telephone Encounter (Addendum)
I did not get to tell the patient this before she left.  Please call her and let her know that with her having COVID and still having symptoms she needs to remain at home until she starts to improve for at least 24 hours and has not had any fevers or chills for at least 24 hours.  Please also get her set up for a virtual visit later this week to check-in.  If I do not have anything available it is okay to schedule her with a different provider.

## 2022-12-11 ENCOUNTER — Other Ambulatory Visit: Payer: Self-pay | Admitting: Family Medicine

## 2022-12-11 DIAGNOSIS — D72829 Elevated white blood cell count, unspecified: Secondary | ICD-10-CM

## 2022-12-11 DIAGNOSIS — D509 Iron deficiency anemia, unspecified: Secondary | ICD-10-CM

## 2022-12-11 NOTE — Telephone Encounter (Signed)
Noted.  See result note.  

## 2022-12-14 ENCOUNTER — Ambulatory Visit: Payer: Medicare HMO | Admitting: Family Medicine

## 2023-01-10 ENCOUNTER — Other Ambulatory Visit: Payer: Medicare HMO

## 2023-01-11 ENCOUNTER — Other Ambulatory Visit (INDEPENDENT_AMBULATORY_CARE_PROVIDER_SITE_OTHER): Payer: Medicare HMO

## 2023-01-11 DIAGNOSIS — D72829 Elevated white blood cell count, unspecified: Secondary | ICD-10-CM | POA: Diagnosis not present

## 2023-01-11 NOTE — Addendum Note (Signed)
Addended by: Jarvis Morgan D on: 01/11/2023 02:46 PM   Modules accepted: Orders

## 2023-01-12 LAB — CBC WITH DIFFERENTIAL/PLATELET
Basophils Absolute: 0.1 10*3/uL (ref 0.0–0.2)
Basos: 1 %
EOS (ABSOLUTE): 0.3 10*3/uL (ref 0.0–0.4)
Eos: 3 %
Hematocrit: 33.4 % — ABNORMAL LOW (ref 34.0–46.6)
Hemoglobin: 11.1 g/dL (ref 11.1–15.9)
Immature Grans (Abs): 0 10*3/uL (ref 0.0–0.1)
Immature Granulocytes: 0 %
Lymphocytes Absolute: 2.4 10*3/uL (ref 0.7–3.1)
Lymphs: 28 %
MCH: 30.6 pg (ref 26.6–33.0)
MCHC: 33.2 g/dL (ref 31.5–35.7)
MCV: 92 fL (ref 79–97)
Monocytes Absolute: 0.6 10*3/uL (ref 0.1–0.9)
Monocytes: 7 %
Neutrophils Absolute: 5.1 10*3/uL (ref 1.4–7.0)
Neutrophils: 61 %
Platelets: 361 10*3/uL (ref 150–450)
RBC: 3.63 x10E6/uL — ABNORMAL LOW (ref 3.77–5.28)
RDW: 13.1 % (ref 11.7–15.4)
WBC: 8.3 10*3/uL (ref 3.4–10.8)

## 2023-01-30 ENCOUNTER — Encounter: Payer: Self-pay | Admitting: Family Medicine

## 2023-01-30 ENCOUNTER — Ambulatory Visit (INDEPENDENT_AMBULATORY_CARE_PROVIDER_SITE_OTHER): Payer: Medicare HMO | Admitting: Family Medicine

## 2023-01-30 VITALS — BP 112/74 | HR 73 | Temp 98.2°F | Ht 59.0 in | Wt 134.6 lb

## 2023-01-30 DIAGNOSIS — E118 Type 2 diabetes mellitus with unspecified complications: Secondary | ICD-10-CM

## 2023-01-30 DIAGNOSIS — I10 Essential (primary) hypertension: Secondary | ICD-10-CM

## 2023-01-30 DIAGNOSIS — B351 Tinea unguium: Secondary | ICD-10-CM | POA: Diagnosis not present

## 2023-01-30 DIAGNOSIS — U071 COVID-19: Secondary | ICD-10-CM | POA: Diagnosis not present

## 2023-01-30 DIAGNOSIS — Z7985 Long-term (current) use of injectable non-insulin antidiabetic drugs: Secondary | ICD-10-CM | POA: Diagnosis not present

## 2023-01-30 DIAGNOSIS — E782 Mixed hyperlipidemia: Secondary | ICD-10-CM | POA: Diagnosis not present

## 2023-01-30 DIAGNOSIS — E611 Iron deficiency: Secondary | ICD-10-CM

## 2023-01-30 DIAGNOSIS — H6122 Impacted cerumen, left ear: Secondary | ICD-10-CM

## 2023-01-30 DIAGNOSIS — E038 Other specified hypothyroidism: Secondary | ICD-10-CM

## 2023-01-30 NOTE — Progress Notes (Signed)
Crystal Alar, MD Phone: [phone]  Crystal Miller is a 77 y.o. adult who presents today for follow-up.  Hypertension: Patient notes her blood pressure similar to here at home.  She is taking amlodipine and carvedilol.  No chest pain, shortness of breath, or edema.  Diabetes: Last check was 143.  Taking Trulicity.  Notes some dry mouth over the last month though thinks this may be related to her dentures.  No hypoglycemia.  She walks daily for 45 minutes.  Patient notes she is doing better after having COVID recently.  Patient reports she feels some left ear earwax.  She wonders if we can remove this.  Social History   Tobacco Use  Smoking Status Former   Current packs/day: 0.00   Average packs/day: 1 pack/day for 20.0 years (20.0 ttl pk-yrs)   Types: Cigarettes   Start date: 36   Quit date: 2012   Years since quitting: 12.7  Smokeless Tobacco Never    Current Outpatient Medications on File Prior to Visit  Medication Sig Dispense Refill   acetaminophen (TYLENOL) 500 MG tablet Take 1,000 mg by mouth every 6 (six) hours as needed (for pain.).     amLODipine (NORVASC) 10 MG tablet Take 1 tablet (10 mg total) by mouth daily. 90 tablet 0   aspirin EC 81 MG EC tablet Take 1 tablet (81 mg total) by mouth daily. 30 tablet 2   atorvastatin (LIPITOR) 40 MG tablet TAKE 1 TABLET BY MOUTH ONCE DAILY AT  6PM 90 tablet 3   blood glucose meter kit and supplies KIT Dispense based on patient and insurance preference. Use up to four times daily as directed. (FOR ICD-9 250.00, 250.01). 1 each 0   calcium carbonate (OS-CAL) 600 MG tablet Take 600 mg by mouth daily.     carvedilol (COREG) 6.25 MG tablet Take 1 tablet (6.25 mg total) by mouth 2 (two) times daily with a meal. 180 tablet 0   diclofenac Sodium (VOLTAREN) 1 % GEL Apply 2 g topically 4 (four) times daily.     doxycycline (VIBRA-TABS) 100 MG tablet Take 1 tablet (100 mg total) by mouth 2 (two) times daily. 14 tablet 0   Multiple  Vitamin (MULTIVITAMIN WITH MINERALS) TABS tablet Take 1 tablet by mouth daily. Centrum Silver . In the morning     omeprazole (PRILOSEC) 20 MG capsule Take 1 capsule (20 mg total) by mouth daily. 90 capsule 4   oxybutynin (DITROPAN-XL) 5 MG 24 hr tablet      Polyethyl Glycol-Propyl Glycol 0.4-0.3 % SOLN Place 1 drop into both eyes 3 (three) times daily as needed (for dry eyes.).     sertraline (ZOLOFT) 25 MG tablet Take 1 tablet (25 mg total) by mouth daily. 90 tablet 3   TRULICITY 0.75 MG/0.5ML SOPN INJECT 1 DOSE SUBCUTANEOUSLY ONCE A WEEK 12 mL 3   No current facility-administered medications on file prior to visit.     ROS see history of present illness  Objective  Physical Exam Vitals:   01/30/23 1600  BP: 112/74  Pulse: 73  Temp: 98.2 F (36.8 C)  SpO2: 96%    BP Readings from Last 3 Encounters:  01/30/23 112/74  12/10/22 118/72  09/03/22 (!) 148/74   Wt Readings from Last 3 Encounters:  01/30/23 134 lb 9.6 oz (61.1 kg)  12/10/22 134 lb 12.8 oz (61.1 kg)  12/04/22 138 lb (62.6 kg)    Physical Exam Constitutional:      General: He is not in acute distress.  Appearance: He is not diaphoretic.  HENT:     Right Ear: Tympanic membrane normal.     Ears:     Comments: Cerumen left ear canal Cardiovascular:     Rate and Rhythm: Normal rate and regular rhythm.     Heart sounds: Normal heart sounds.  Pulmonary:     Effort: Pulmonary effort is normal.     Breath sounds: Normal breath sounds.  Skin:    General: Skin is warm and dry.  Neurological:     Mental Status: He is alert.      Assessment/Plan: Please see individual problem list.  Essential hypertension Assessment & Plan: Chronic issue.  Well-controlled.  Continue amlodipine 10 mg daily and carvedilol 6.25 mg twice daily.  Orders: -     Comprehensive metabolic panel -     Lipid panel  DM (diabetes mellitus), type 2 with complications [facility]) Assessment & Plan: Chronic issue.  Check lab work.   Continue Trulicity 0.75 mg weekly.  Orders: -     Hemoglobin A1c -     Comprehensive metabolic panel -     Lipid panel  Subclinical hypothyroidism -     TSH  Onychomycosis -     Ambulatory referral to Podiatry  Mixed hyperlipidemia -     Comprehensive metabolic panel -     Lipid panel  Iron deficiency -     CBC -     IBC + Ferritin  Impacted cerumen of left ear Assessment & Plan: Patient will schedule a nurse visit for ear irrigation.  She will use Debrox for 3 days prior to this.   COVID Assessment & Plan: Patient is back to her baseline.     Return in about 1 week (around 02/06/2023) for nurse visit left ear irrigation, 6 months PCP.   Crystal Alar, MD [facility] Primary Care [facility]

## 2023-01-30 NOTE — Patient Instructions (Addendum)
Please use debrox over the counter for 3 days for your left ear prior to coming in for your nurse visit.

## 2023-01-31 LAB — IBC + FERRITIN
Ferritin: 55.1 ng/mL (ref 10.0–291.0)
Iron: 87 ug/dL (ref 42–145)
Saturation Ratios: 30 % (ref 20.0–50.0)
TIBC: 289.8 ug/dL (ref 250.0–450.0)
Transferrin: 207 mg/dL — ABNORMAL LOW (ref 212.0–360.0)

## 2023-01-31 LAB — CBC
HCT: 32.1 % — ABNORMAL LOW (ref 36.0–46.0)
Hemoglobin: 10.7 g/dL — ABNORMAL LOW (ref 12.0–15.0)
MCHC: 33.2 g/dL (ref 30.0–36.0)
MCV: 91.9 fl (ref 78.0–100.0)
Platelets: 372 10*3/uL (ref 150.0–400.0)
RBC: 3.49 Mil/uL — ABNORMAL LOW (ref 3.87–5.11)
RDW: 13.2 % (ref 11.5–15.5)
WBC: 9.6 10*3/uL (ref 4.0–10.5)

## 2023-01-31 LAB — COMPREHENSIVE METABOLIC PANEL
ALT: 18 U/L (ref 0–35)
AST: 18 U/L (ref 0–37)
Albumin: 3.6 g/dL (ref 3.5–5.2)
Alkaline Phosphatase: 78 U/L (ref 39–117)
BUN: 30 mg/dL — ABNORMAL HIGH (ref 6–23)
CO2: 21 meq/L (ref 19–32)
Calcium: 8.8 mg/dL (ref 8.4–10.5)
Chloride: 105 meq/L (ref 96–112)
Creatinine, Ser: 1.59 mg/dL — ABNORMAL HIGH (ref 0.40–1.20)
GFR: 30.93 mL/min — ABNORMAL LOW (ref >60.00)
Glucose, Bld: 110 mg/dL — ABNORMAL HIGH (ref 70–99)
Potassium: 5 meq/L (ref 3.5–5.1)
Sodium: 136 meq/L (ref 135–145)
Total Bilirubin: 0.3 mg/dL (ref 0.2–1.2)
Total Protein: 7.2 g/dL (ref 6.0–8.3)

## 2023-01-31 LAB — LIPID PANEL
Cholesterol: 78 mg/dL (ref 0–200)
HDL: 24.8 mg/dL — ABNORMAL LOW (ref >39.00)
LDL Cholesterol: 15 mg/dL (ref 0–99)
NonHDL: 53.08
Total CHOL/HDL Ratio: 3
Triglycerides: 192 mg/dL — ABNORMAL HIGH (ref 0.0–149.0)
VLDL: 38.4 mg/dL (ref 0.0–40.0)

## 2023-01-31 LAB — HEMOGLOBIN A1C: Hgb A1c MFr Bld: 6.2 % (ref 4.6–6.5)

## 2023-01-31 LAB — TSH: TSH: 3.96 u[IU]/mL (ref 0.35–5.50)

## 2023-02-01 ENCOUNTER — Telehealth: Payer: Self-pay

## 2023-02-01 DIAGNOSIS — H6120 Impacted cerumen, unspecified ear: Secondary | ICD-10-CM | POA: Insufficient documentation

## 2023-02-01 NOTE — Assessment & Plan Note (Signed)
Chronic issue.  Well-controlled.  Continue amlodipine 10 mg daily and carvedilol 6.25 mg twice daily.

## 2023-02-01 NOTE — Telephone Encounter (Signed)
Left message to call the office back regarding the lab results below.

## 2023-02-01 NOTE — Assessment & Plan Note (Signed)
Patient is back to her baseline.

## 2023-02-01 NOTE — Telephone Encounter (Signed)
-----   Message from Marikay Alar sent at 02/01/2023 10:36 AM EDT ----- Please let the patient know her kidney function is generally stable.  It is in the chronic kidney disease stage III range.  She should sinew to follow with nephrology.  Her color well is well-controlled.  Her iron levels are acceptable.  Her hemoglobin is generally stable over the last year.  Will continue to periodically monitor this.  Her A1c is adequately controlled.  She was previously referred to GI for her iron deficiency.  Did she ever get scheduled with them?

## 2023-02-01 NOTE — Assessment & Plan Note (Signed)
Chronic issue.  Check lab work.  Continue Trulicity 0.75 mg weekly.

## 2023-02-01 NOTE — Assessment & Plan Note (Signed)
Patient will schedule a nurse visit for ear irrigation.  She will use Debrox for 3 days prior to this.

## 2023-02-04 NOTE — Telephone Encounter (Signed)
Pt called back and I read the note and she stated she has not made the appointment for GI

## 2023-02-04 NOTE — Telephone Encounter (Signed)
Noted.  She needs to make the follow-up appointment with GI.  If she needs [place] to place another referral we can.  Thanks.

## 2023-02-04 NOTE — Telephone Encounter (Signed)
Noted  

## 2023-02-04 NOTE — Telephone Encounter (Signed)
Patient states GI called and set up an appointment for her.

## 2023-02-06 ENCOUNTER — Ambulatory Visit: Payer: Medicare HMO

## 2023-02-06 ENCOUNTER — Ambulatory Visit (INDEPENDENT_AMBULATORY_CARE_PROVIDER_SITE_OTHER): Payer: Medicare HMO

## 2023-02-06 DIAGNOSIS — H6122 Impacted cerumen, left ear: Secondary | ICD-10-CM

## 2023-02-06 NOTE — Progress Notes (Deleted)
Cerumen Impaction Patient presents with Wax Impaction in the left ear for the past . There is a prior history of cerumen impaction. The patient has been using ear drops to loosen wax immediately prior to this visit. The patient denies ear pain pt just can not hear good out of left ear     Before I began flushing I checked in each ear  I had pt feel the water to see if it was too hot or too cold. Pt verbalized it was okay so I proceeded; periodically asking if patient was okay. Pt stated that she could hear out of her ear and we were able to see eardrum when procedure was completed   Process was completed & pt denied any pain or dizziness.  Ear were visualized one last time using otoscope prior to completion of visit

## 2023-02-06 NOTE — Progress Notes (Signed)
Cerumen Impaction Patient presents with Wax Impaction in the left ear for the past . There is a prior history of cerumen impaction. The patient has been using ear drops to loosen wax immediately prior to this visit. The patient denies ear pain pt just can not hear good out of left ear     Before I began flushing I checked in each ear  I had pt feel the water to see if it was too hot or too cold. Pt verbalized it was okay so I proceeded; periodically asking if patient was okay. Pt stated that she could hear out of her ear and we were able to see eardrum when procedure was completed   Process was completed & pt denied any pain or dizziness.  Ear were visualized one last time using otoscope prior to completion of visit

## 2023-02-14 DIAGNOSIS — H40003 Preglaucoma, unspecified, bilateral: Secondary | ICD-10-CM | POA: Diagnosis not present

## 2023-02-14 DIAGNOSIS — H2511 Age-related nuclear cataract, right eye: Secondary | ICD-10-CM | POA: Diagnosis not present

## 2023-02-14 DIAGNOSIS — H2512 Age-related nuclear cataract, left eye: Secondary | ICD-10-CM | POA: Diagnosis not present

## 2023-02-14 DIAGNOSIS — E119 Type 2 diabetes mellitus without complications: Secondary | ICD-10-CM | POA: Diagnosis not present

## 2023-02-14 LAB — HM DIABETES EYE EXAM

## 2023-04-09 ENCOUNTER — Other Ambulatory Visit: Payer: Self-pay | Admitting: Family Medicine

## 2023-04-23 DIAGNOSIS — N2581 Secondary hyperparathyroidism of renal origin: Secondary | ICD-10-CM | POA: Diagnosis not present

## 2023-04-23 DIAGNOSIS — E875 Hyperkalemia: Secondary | ICD-10-CM | POA: Diagnosis not present

## 2023-04-23 DIAGNOSIS — E1122 Type 2 diabetes mellitus with diabetic chronic kidney disease: Secondary | ICD-10-CM | POA: Diagnosis not present

## 2023-04-23 DIAGNOSIS — I10 Essential (primary) hypertension: Secondary | ICD-10-CM | POA: Diagnosis not present

## 2023-04-23 DIAGNOSIS — N1832 Chronic kidney disease, stage 3b: Secondary | ICD-10-CM | POA: Diagnosis not present

## 2023-04-29 ENCOUNTER — Encounter: Payer: Self-pay | Admitting: Podiatry

## 2023-04-29 ENCOUNTER — Ambulatory Visit: Payer: Medicare HMO | Admitting: Podiatry

## 2023-04-29 VITALS — Ht 59.0 in | Wt 134.6 lb

## 2023-04-29 DIAGNOSIS — M79674 Pain in right toe(s): Secondary | ICD-10-CM

## 2023-04-29 DIAGNOSIS — E118 Type 2 diabetes mellitus with unspecified complications: Secondary | ICD-10-CM | POA: Diagnosis not present

## 2023-04-29 DIAGNOSIS — M79675 Pain in left toe(s): Secondary | ICD-10-CM | POA: Diagnosis not present

## 2023-04-29 DIAGNOSIS — B351 Tinea unguium: Secondary | ICD-10-CM

## 2023-04-29 DIAGNOSIS — L84 Corns and callosities: Secondary | ICD-10-CM

## 2023-04-30 ENCOUNTER — Other Ambulatory Visit: Payer: Self-pay | Admitting: Family Medicine

## 2023-04-30 DIAGNOSIS — E118 Type 2 diabetes mellitus with unspecified complications: Secondary | ICD-10-CM

## 2023-05-01 DIAGNOSIS — N1832 Chronic kidney disease, stage 3b: Secondary | ICD-10-CM | POA: Diagnosis not present

## 2023-05-01 DIAGNOSIS — N2581 Secondary hyperparathyroidism of renal origin: Secondary | ICD-10-CM | POA: Diagnosis not present

## 2023-05-01 DIAGNOSIS — I10 Essential (primary) hypertension: Secondary | ICD-10-CM | POA: Diagnosis not present

## 2023-05-01 DIAGNOSIS — E875 Hyperkalemia: Secondary | ICD-10-CM | POA: Diagnosis not present

## 2023-05-01 DIAGNOSIS — E1122 Type 2 diabetes mellitus with diabetic chronic kidney disease: Secondary | ICD-10-CM | POA: Diagnosis not present

## 2023-05-03 ENCOUNTER — Encounter: Payer: Self-pay | Admitting: Podiatry

## 2023-05-03 NOTE — Progress Notes (Signed)
  Subjective:  Patient ID: Crystal Miller, adult    DOB: May 21, 1946,  MRN: [id number]  77 y.o. adult presents to clinic with  preventative diabetic foot care and painful elongated mycotic toenails 1-5 bilaterally which are tender when wearing enclosed shoe gear. Pain is relieved with periodic professional debridement.  Chief Complaint  Patient presents with   Nail Problem    PT is here for [facility], pt is unsure of last A1C, PCP is Dr Birdie Sons and LOV visit was a few months ago.    New problem(s): None   PCP is Glori Luis, MD.  Allergies  Allergen Reactions   Metformin And Related Diarrhea    Review of Systems: Negative except as noted in the HPI.   Objective:  Crystal Miller is a pleasant 77 y.o. adult WD, WN in NAD. AAO x 3.  Vascular Examination: Vascular status intact b/l with palpable pedal pulses. CFT immediate b/l. No edema. No pain with calf compression b/l. Skin temperature gradient WNL b/l. No cyanosis or clubbing noted b/l LE.  Neurological Examination: Sensation grossly intact b/l with 10 gram monofilament. Vibratory sensation intact b/l.   Dermatological Examination: Pedal skin with normal turgor, texture and tone b/l. Toenails 1-5 b/l thick, discolored, elongated with subungual debris and pain on dorsal palpation.   Hyperkeratotic lesion(s) interdigitally kissing corns right 4th and 5th digits.  No erythema, no edema, no drainage, no fluctuance.  Musculoskeletal Examination: Muscle strength 5/5 to b/l LE. Utilizes rollator for ambulation assistance.  Radiographs: None  Last A1c:      Latest Ref Rng & Units 01/30/2023    4:14 PM 07/30/2022    4:29 PM  Hemoglobin A1C  Hemoglobin-A1c 4.6 - 6.5 % 6.2  6.1    Assessment:   1. Pain due to onychomycosis of toenails of both feet   2. Corns   3. DM (diabetes mellitus), type 2 with complications (HCC)    Plan:  -Consent given for treatment as described below: -Examined patient. -Continue foot and shoe  inspections daily. Monitor blood glucose per PCP/Endocrinologist's recommendations. -Patient to continue soft, supportive shoe gear daily. -Toenails 1-5 b/l were debrided in length and girth with sterile nail nippers and dremel without iatrogenic bleeding.  -Corn(s) R 4th toe and R 5th toe pared utilizing sterile scalpel blade without complication or incident. Total number debrided=2. -Patient/POA to call should there be question/concern in the interim.  No follow-ups on file.  Freddie Breech, DPM      [facility] LOCATION: [address], [place] [address]                   Office [phone]   [facility] LOCATION: [address] [place], [place] [address] Office [phone]

## 2023-05-07 ENCOUNTER — Other Ambulatory Visit: Payer: Self-pay

## 2023-05-07 ENCOUNTER — Telehealth: Payer: Self-pay

## 2023-05-07 DIAGNOSIS — E118 Type 2 diabetes mellitus with unspecified complications: Secondary | ICD-10-CM

## 2023-05-07 MED ORDER — TRULICITY 0.75 MG/0.5ML ~~LOC~~ SOAJ: 0.7500 mg | SUBCUTANEOUS | 0 refills | Status: DC

## 2023-05-07 NOTE — Telephone Encounter (Signed)
Prescription Request  05/07/2023  LOV: Visit date not found  What is the name of the medication or equipment? TRULICITY 0.75 MG/0.5ML SOAJ  Have you contacted your pharmacy to request a refill? Yes   Which pharmacy would you like this sent to?  [facility] [address], [place] - [address] [place] [address] Phone: [phone] Fax: [phone]    Patient notified that their request is being sent to the clinical staff for review and that they should receive a response within 2 business days.   Please advise at Mobile [phone] (mobile)  Patient states she is out of this medication.  Patient states her pharmacy told her that they will refax the request to [place].

## 2023-05-07 NOTE — Telephone Encounter (Signed)
Prescription sent to pharmacy.

## 2023-05-21 ENCOUNTER — Other Ambulatory Visit: Payer: Self-pay | Admitting: Family Medicine

## 2023-05-21 DIAGNOSIS — E782 Mixed hyperlipidemia: Secondary | ICD-10-CM

## 2023-06-03 ENCOUNTER — Other Ambulatory Visit: Payer: Self-pay | Admitting: Physician Assistant

## 2023-06-03 ENCOUNTER — Other Ambulatory Visit: Payer: Self-pay | Admitting: Family Medicine

## 2023-06-03 DIAGNOSIS — I10 Essential (primary) hypertension: Secondary | ICD-10-CM

## 2023-06-19 ENCOUNTER — Other Ambulatory Visit: Payer: Self-pay | Admitting: Family Medicine

## 2023-06-19 DIAGNOSIS — F330 Major depressive disorder, recurrent, mild: Secondary | ICD-10-CM

## 2023-07-01 ENCOUNTER — Telehealth: Payer: Self-pay | Admitting: Physician Assistant

## 2023-07-01 NOTE — Telephone Encounter (Signed)
 Patient requesting medication refill for omeprazole  sent into [facility] on [address].please advise.

## 2023-07-03 ENCOUNTER — Other Ambulatory Visit: Payer: Self-pay

## 2023-07-03 MED ORDER — OMEPRAZOLE 20 MG PO CPDR: 20.0000 mg | DELAYED_RELEASE_CAPSULE | Freq: Every day | ORAL | 0 refills | Status: DC

## 2023-07-03 NOTE — Telephone Encounter (Signed)
Refill sent to patients pharmacy.

## 2023-07-07 ENCOUNTER — Other Ambulatory Visit: Payer: Self-pay | Admitting: Family Medicine

## 2023-07-07 DIAGNOSIS — F330 Major depressive disorder, recurrent, mild: Secondary | ICD-10-CM

## 2023-07-26 ENCOUNTER — Telehealth: Payer: Self-pay | Admitting: Family Medicine

## 2023-07-26 NOTE — Telephone Encounter (Signed)
 07/31/2023 2pm appointment canceled. Dr Clent Ridges is leaving the practice and is no longer taking on new patients or transfer of care patients. Please call the office to schedule with Dr Skip Estimable.  Thank you.

## 2023-07-27 ENCOUNTER — Other Ambulatory Visit: Payer: Self-pay | Admitting: Physician Assistant

## 2023-07-31 ENCOUNTER — Ambulatory Visit: Payer: Medicare HMO | Admitting: Family Medicine

## 2023-07-31 ENCOUNTER — Encounter: Payer: Medicare HMO | Admitting: Family Medicine

## 2023-08-01 ENCOUNTER — Ambulatory Visit: Payer: Medicare HMO | Admitting: Podiatry

## 2023-08-01 ENCOUNTER — Encounter: Payer: Self-pay | Admitting: Podiatry

## 2023-08-01 VITALS — Ht 59.0 in | Wt 134.6 lb

## 2023-08-01 DIAGNOSIS — M79674 Pain in right toe(s): Secondary | ICD-10-CM

## 2023-08-01 DIAGNOSIS — M79675 Pain in left toe(s): Secondary | ICD-10-CM

## 2023-08-01 DIAGNOSIS — E118 Type 2 diabetes mellitus with unspecified complications: Secondary | ICD-10-CM | POA: Diagnosis not present

## 2023-08-01 DIAGNOSIS — B351 Tinea unguium: Secondary | ICD-10-CM | POA: Diagnosis not present

## 2023-08-02 ENCOUNTER — Other Ambulatory Visit: Payer: Self-pay | Admitting: Family Medicine

## 2023-08-02 DIAGNOSIS — E118 Type 2 diabetes mellitus with unspecified complications: Secondary | ICD-10-CM

## 2023-08-02 NOTE — Telephone Encounter (Signed)
 Copied from CRM [id number]. Topic: Clinical - Medication Refill >> Aug 02, 2023 12:26 PM [name] wrote: Most Recent Primary Care Visit:  Provider: [place], JENATE  Department: [facility]  Visit Type: NURSE VISIT  Date: 02/06/2023  Medication: Dulaglutide (TRULICITY) 0.75 MG/0.5ML SOAJ  Has the patient contacted their pharmacy? Yes (Agent: If no, request that the patient contact the pharmacy for the refill. If patient does not wish to contact the pharmacy document the reason why and proceed with request.) (Agent: If yes, when and what did the pharmacy advise?)  Is this the correct pharmacy for this prescription? Yes If no, delete pharmacy and type the correct one.  This is the patient's preferred pharmacy:  [facility] [address] [place] - [address] [place] [address] Phone: [phone] Fax: [phone]   Has the prescription been filled recently? No  Is the patient out of the medication? Yes  Has the patient been seen for an appointment in the last year OR does the patient have an upcoming appointment? Yes  Can we respond through MyChart? No  Agent: Please be advised that Rx refills may take up to 3 business days. We ask that you follow-up with your pharmacy.

## 2023-08-05 ENCOUNTER — Other Ambulatory Visit: Payer: Self-pay

## 2023-08-05 MED ORDER — TRULICITY 0.75 MG/0.5ML ~~LOC~~ SOAJ: 0.7500 mg | SUBCUTANEOUS | 1 refills | Status: DC

## 2023-08-05 NOTE — Telephone Encounter (Signed)
 Refill request has been handled by other means.

## 2023-08-06 NOTE — Progress Notes (Signed)
  Subjective:  Patient ID: Crystal Miller, adult    DOB: November 02, 1945,  MRN: [id number]  78 y.o. adult presents preventative diabetic foot care and corn(s) right foot and painful thick toenails that are difficult to trim. Painful toenails interfere with ambulation. Aggravating factors include wearing enclosed shoe gear. Pain is relieved with periodic professional debridement. Painful corns are aggravated when weightbearing when wearing enclosed shoe gear. Pain is relieved with periodic professional debridement.  Chief Complaint  Patient presents with   Nail Problem    Pt is here for [facility] A1C 6.8 PCP is Dr Birdie Sons and LOV was 6 months ago.    New problem(s): None   PCP is Birdie Sons, Yehuda Mao, MD (Inactive).  Allergies  Allergen Reactions   Metformin And Related Diarrhea   Review of Systems: Negative except as noted in the HPI.   Objective:  Roberto Hlavaty is a pleasant 78 y.o. adult WD, WN in NAD. AAO x 3.  Vascular Examination: Vascular status intact b/l with palpable pedal pulses. CFT immediate b/l. Pedal hair present. No edema. No pain with calf compression b/l. Skin temperature gradient WNL b/l. No varicosities noted. No cyanosis or clubbing noted.  Neurological Examination: Sensation grossly intact b/l with 10 gram monofilament. Vibratory sensation intact b/l.  Dermatological Examination: Pedal skin with normal turgor, texture and tone b/l. No open wounds nor interdigital macerations noted. Toenails 1-5 b/l thick, discolored, elongated with subungual debris and pain on dorsal palpation. Resolved hyperkeratotic lesions right foot.  Musculoskeletal Examination: Muscle strength 5/5 to b/l LE.  No pain, crepitus noted b/l.  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 Utilizes rollator for ambulation assistance.  Radiographs: None  Last A1c:      Latest Ref Rng & Units 01/30/2023    4:14 PM  Hemoglobin A1C  Hemoglobin-A1c 4.6 - 6.5 % 6.2      Assessment:   1. Pain due to onychomycosis  of toenails of both feet   2. DM (diabetes mellitus), type 2 with complications [facility])    Plan:  Patient was evaluated and treated. All patient's and/or POA's questions/concerns addressed on today's visit. Mycotic toenails 1-5 debrided in length and girth without incident.  Continue daily foot inspections and monitor blood glucose per PCP/Endocrinologist's recommendations.Continue soft, supportive shoe gear daily. Report any pedal injuries to medical professional. Call office if there are any quesitons/concerns. -Patient/POA to call should there be question/concern in the interim.  Return in about 3 months (around 11/01/2023).  Freddie Breech, DPM      [facility] LOCATION: [address] [place] [address]                   Office [phone]   [facility] LOCATION: [address] [place], [place] [address] Office [phone]

## 2023-08-08 ENCOUNTER — Ambulatory Visit: Payer: Medicare HMO | Admitting: Gastroenterology

## 2023-08-14 DIAGNOSIS — R509 Fever, unspecified: Secondary | ICD-10-CM | POA: Diagnosis not present

## 2023-08-14 DIAGNOSIS — J020 Streptococcal pharyngitis: Secondary | ICD-10-CM | POA: Diagnosis not present

## 2023-08-17 ENCOUNTER — Inpatient Hospital Stay

## 2023-08-17 ENCOUNTER — Inpatient Hospital Stay
Admission: EM | Admit: 2023-08-17 | Discharge: 2023-08-26 | DRG: 872 | Disposition: A | Discharge status: Skilled Nursing Facility | Attending: Internal Medicine | Admitting: Internal Medicine

## 2023-08-17 ENCOUNTER — Other Ambulatory Visit: Payer: Self-pay

## 2023-08-17 ENCOUNTER — Emergency Department

## 2023-08-17 DIAGNOSIS — N179 Acute kidney failure, unspecified: Secondary | ICD-10-CM

## 2023-08-17 DIAGNOSIS — A414 Sepsis due to anaerobes: Principal | ICD-10-CM | POA: Diagnosis present

## 2023-08-17 DIAGNOSIS — Z79899 Other long term (current) drug therapy: Secondary | ICD-10-CM | POA: Diagnosis not present

## 2023-08-17 DIAGNOSIS — I639 Cerebral infarction, unspecified: Secondary | ICD-10-CM | POA: Diagnosis present

## 2023-08-17 DIAGNOSIS — E871 Hypo-osmolality and hyponatremia: Secondary | ICD-10-CM

## 2023-08-17 DIAGNOSIS — R5381 Other malaise: Secondary | ICD-10-CM | POA: Diagnosis present

## 2023-08-17 DIAGNOSIS — I10 Essential (primary) hypertension: Secondary | ICD-10-CM | POA: Diagnosis present

## 2023-08-17 DIAGNOSIS — K219 Gastro-esophageal reflux disease without esophagitis: Secondary | ICD-10-CM | POA: Diagnosis present

## 2023-08-17 DIAGNOSIS — E1122 Type 2 diabetes mellitus with diabetic chronic kidney disease: Secondary | ICD-10-CM | POA: Diagnosis not present

## 2023-08-17 DIAGNOSIS — Z833 Family history of diabetes mellitus: Secondary | ICD-10-CM | POA: Diagnosis not present

## 2023-08-17 DIAGNOSIS — Z8673 Personal history of transient ischemic attack (TIA), and cerebral infarction without residual deficits: Secondary | ICD-10-CM

## 2023-08-17 DIAGNOSIS — Z888 Allergy status to other drugs, medicaments and biological substances status: Secondary | ICD-10-CM | POA: Diagnosis not present

## 2023-08-17 DIAGNOSIS — A419 Sepsis, unspecified organism: Principal | ICD-10-CM

## 2023-08-17 DIAGNOSIS — E1151 Type 2 diabetes mellitus with diabetic peripheral angiopathy without gangrene: Secondary | ICD-10-CM | POA: Diagnosis present

## 2023-08-17 DIAGNOSIS — Z7982 Long term (current) use of aspirin: Secondary | ICD-10-CM

## 2023-08-17 DIAGNOSIS — F39 Unspecified mood [affective] disorder: Secondary | ICD-10-CM | POA: Diagnosis not present

## 2023-08-17 DIAGNOSIS — R7989 Other specified abnormal findings of blood chemistry: Secondary | ICD-10-CM | POA: Insufficient documentation

## 2023-08-17 DIAGNOSIS — E870 Hyperosmolality and hypernatremia: Secondary | ICD-10-CM | POA: Diagnosis not present

## 2023-08-17 DIAGNOSIS — Z66 Do not resuscitate: Secondary | ICD-10-CM | POA: Diagnosis not present

## 2023-08-17 DIAGNOSIS — R6520 Severe sepsis without septic shock: Secondary | ICD-10-CM | POA: Diagnosis present

## 2023-08-17 DIAGNOSIS — N1832 Chronic kidney disease, stage 3b: Secondary | ICD-10-CM | POA: Diagnosis not present

## 2023-08-17 DIAGNOSIS — J020 Streptococcal pharyngitis: Secondary | ICD-10-CM | POA: Diagnosis not present

## 2023-08-17 DIAGNOSIS — R7401 Elevation of levels of liver transaminase levels: Secondary | ICD-10-CM

## 2023-08-17 DIAGNOSIS — Z7985 Long-term (current) use of injectable non-insulin antidiabetic drugs: Secondary | ICD-10-CM

## 2023-08-17 DIAGNOSIS — Z1152 Encounter for screening for COVID-19: Secondary | ICD-10-CM

## 2023-08-17 DIAGNOSIS — J029 Acute pharyngitis, unspecified: Secondary | ICD-10-CM

## 2023-08-17 DIAGNOSIS — E782 Mixed hyperlipidemia: Secondary | ICD-10-CM | POA: Diagnosis present

## 2023-08-17 DIAGNOSIS — Z87891 Personal history of nicotine dependence: Secondary | ICD-10-CM | POA: Diagnosis not present

## 2023-08-17 DIAGNOSIS — Z8249 Family history of ischemic heart disease and other diseases of the circulatory system: Secondary | ICD-10-CM

## 2023-08-17 DIAGNOSIS — E860 Dehydration: Secondary | ICD-10-CM

## 2023-08-17 DIAGNOSIS — Z823 Family history of stroke: Secondary | ICD-10-CM

## 2023-08-17 DIAGNOSIS — A0472 Enterocolitis due to Clostridium difficile, not specified as recurrent: Secondary | ICD-10-CM

## 2023-08-17 DIAGNOSIS — R509 Fever, unspecified: Secondary | ICD-10-CM | POA: Diagnosis not present

## 2023-08-17 DIAGNOSIS — R0689 Other abnormalities of breathing: Secondary | ICD-10-CM | POA: Diagnosis not present

## 2023-08-17 DIAGNOSIS — Z801 Family history of malignant neoplasm of trachea, bronchus and lung: Secondary | ICD-10-CM

## 2023-08-17 DIAGNOSIS — Z87442 Personal history of urinary calculi: Secondary | ICD-10-CM

## 2023-08-17 DIAGNOSIS — Z743 Need for continuous supervision: Secondary | ICD-10-CM | POA: Diagnosis not present

## 2023-08-17 DIAGNOSIS — I129 Hypertensive chronic kidney disease with stage 1 through stage 4 chronic kidney disease, or unspecified chronic kidney disease: Secondary | ICD-10-CM | POA: Diagnosis present

## 2023-08-17 DIAGNOSIS — K760 Fatty (change of) liver, not elsewhere classified: Secondary | ICD-10-CM | POA: Diagnosis present

## 2023-08-17 DIAGNOSIS — Z8261 Family history of arthritis: Secondary | ICD-10-CM

## 2023-08-17 DIAGNOSIS — E119 Type 2 diabetes mellitus without complications: Secondary | ICD-10-CM | POA: Diagnosis not present

## 2023-08-17 DIAGNOSIS — N3281 Overactive bladder: Secondary | ICD-10-CM | POA: Diagnosis not present

## 2023-08-17 DIAGNOSIS — Z0389 Encounter for observation for other suspected diseases and conditions ruled out: Secondary | ICD-10-CM | POA: Diagnosis not present

## 2023-08-17 DIAGNOSIS — Z820 Family history of epilepsy and other diseases of the nervous system: Secondary | ICD-10-CM

## 2023-08-17 HISTORY — DX: Elevation of levels of liver transaminase levels: R74.01

## 2023-08-17 HISTORY — DX: Enterocolitis due to Clostridium difficile, not specified as recurrent: A04.72

## 2023-08-17 HISTORY — DX: Acute pharyngitis, unspecified: J02.9

## 2023-08-17 HISTORY — DX: Sepsis, unspecified organism: A41.9

## 2023-08-17 LAB — URINALYSIS, ROUTINE W REFLEX MICROSCOPIC
Bilirubin Urine: NEGATIVE
Glucose, UA: NEGATIVE mg/dL
Hgb urine dipstick: NEGATIVE
Ketones, ur: NEGATIVE mg/dL
Leukocytes,Ua: NEGATIVE
Nitrite: NEGATIVE
Protein, ur: 30 mg/dL — AB
Specific Gravity, Urine: 1.019 (ref 1.005–1.030)
pH: 5 (ref 5.0–8.0)

## 2023-08-17 LAB — CBC WITH DIFFERENTIAL/PLATELET
Abs Immature Granulocytes: 0.12 10*3/uL — ABNORMAL HIGH (ref 0.00–0.07)
Basophils Absolute: 0 10*3/uL (ref 0.0–0.1)
Basophils Relative: 0 %
Eosinophils Absolute: 0.2 10*3/uL (ref 0.0–0.5)
Eosinophils Relative: 1 %
HCT: 30.4 % — ABNORMAL LOW (ref 36.0–46.0)
Hemoglobin: 10.5 g/dL — ABNORMAL LOW (ref 12.0–15.0)
Immature Granulocytes: 1 %
Lymphocytes Relative: 4 %
Lymphs Abs: 0.6 10*3/uL — ABNORMAL LOW (ref 0.7–4.0)
MCH: 31.5 pg (ref 26.0–34.0)
MCHC: 34.5 g/dL (ref 30.0–36.0)
MCV: 91.3 fL (ref 80.0–100.0)
Monocytes Absolute: 1.1 10*3/uL — ABNORMAL HIGH (ref 0.1–1.0)
Monocytes Relative: 7 %
Neutro Abs: 13.4 10*3/uL — ABNORMAL HIGH (ref 1.7–7.7)
Neutrophils Relative %: 87 %
Platelets: 243 10*3/uL (ref 150–400)
RBC: 3.33 MIL/uL — ABNORMAL LOW (ref 3.87–5.11)
RDW: 12.2 % (ref 11.5–15.5)
WBC: 15.3 10*3/uL — ABNORMAL HIGH (ref 4.0–10.5)
nRBC: 0 % (ref 0.0–0.2)

## 2023-08-17 LAB — LACTIC ACID, PLASMA
Lactic Acid, Venous: 1.3 mmol/L (ref 0.5–1.9)
Lactic Acid, Venous: 0.8 mmol/L (ref 0.5–1.9)

## 2023-08-17 LAB — COMPREHENSIVE METABOLIC PANEL WITH GFR
ALT: 63 U/L — ABNORMAL HIGH (ref 0–44)
AST: 41 U/L (ref 15–41)
Albumin: 2.9 g/dL — ABNORMAL LOW (ref 3.5–5.0)
Alkaline Phosphatase: 134 U/L — ABNORMAL HIGH (ref 38–126)
Anion gap: 9 (ref 5–15)
BUN: 41 mg/dL — ABNORMAL HIGH (ref 8–23)
CO2: 20 mmol/L — ABNORMAL LOW (ref 22–32)
Calcium: 8.1 mg/dL — ABNORMAL LOW (ref 8.9–10.3)
Chloride: 98 mmol/L (ref 98–111)
Creatinine, Ser: 1.79 mg/dL — ABNORMAL HIGH (ref 0.44–1.00)
GFR, Estimated: 28 mL/min — ABNORMAL LOW (ref >60)
Glucose, Bld: 120 mg/dL — ABNORMAL HIGH (ref 70–99)
Potassium: 4.3 mmol/L (ref 3.5–5.1)
Sodium: 127 mmol/L — ABNORMAL LOW (ref 135–145)
Total Bilirubin: 0.7 mg/dL (ref 0.0–1.2)
Total Protein: 6.7 g/dL (ref 6.5–8.1)

## 2023-08-17 LAB — TROPONIN I (HIGH SENSITIVITY)
Troponin I (High Sensitivity): 20 ng/L — ABNORMAL HIGH (ref <18)
Troponin I (High Sensitivity): 19 ng/L — ABNORMAL HIGH (ref <18)

## 2023-08-17 LAB — RESP PANEL BY RT-PCR (RSV, FLU A&B, COVID)  RVPGX2
Influenza A by PCR: NEGATIVE
Influenza B by PCR: NEGATIVE
Resp Syncytial Virus by PCR: NEGATIVE
SARS Coronavirus 2 by RT PCR: NEGATIVE

## 2023-08-17 LAB — GROUP A STREP BY PCR: Group A Strep by PCR: NOT DETECTED

## 2023-08-17 MED ORDER — OXYBUTYNIN CHLORIDE ER 5 MG PO TB24
5.0000 mg | ORAL_TABLET | Freq: Every day | ORAL | Status: DC
  Administered 2023-08-17 – 2023-08-25 (×9): 5 mg via ORAL
  Filled 2023-08-17 (×10): qty 1

## 2023-08-17 MED ORDER — SODIUM CHLORIDE 0.9 % IV BOLUS (SEPSIS)
1000.0000 mL | Freq: Once | INTRAVENOUS | Status: AC
  Administered 2023-08-17: 1000 mL via INTRAVENOUS

## 2023-08-17 MED ORDER — METRONIDAZOLE 500 MG/100ML IV SOLN
500.0000 mg | Freq: Once | INTRAVENOUS | Status: AC
  Administered 2023-08-17: 500 mg via INTRAVENOUS
  Filled 2023-08-17: qty 100

## 2023-08-17 MED ORDER — SODIUM CHLORIDE 0.9 % IV SOLN
2.0000 g | Freq: Once | INTRAVENOUS | Status: AC
  Administered 2023-08-17: 2 g via INTRAVENOUS
  Filled 2023-08-17: qty 12.5

## 2023-08-17 MED ORDER — VANCOMYCIN HCL IN DEXTROSE 1-5 GM/200ML-% IV SOLN
1000.0000 mg | Freq: Once | INTRAVENOUS | Status: AC
  Administered 2023-08-17: 1000 mg via INTRAVENOUS
  Filled 2023-08-17: qty 200

## 2023-08-17 MED ORDER — CALCIUM GLUCONATE-NACL 1-0.675 GM/50ML-% IV SOLN
1.0000 g | Freq: Once | INTRAVENOUS | Status: AC
  Administered 2023-08-17: 1000 mg via INTRAVENOUS
  Filled 2023-08-17: qty 50

## 2023-08-17 MED ORDER — SERTRALINE HCL 50 MG PO TABS
25.0000 mg | ORAL_TABLET | Freq: Every day | ORAL | Status: DC
Start: 2023-08-18 — End: 2023-08-26
  Administered 2023-08-18 – 2023-08-26 (×9): 25 mg via ORAL
  Filled 2023-08-17 (×9): qty 1

## 2023-08-17 MED ORDER — PIPERACILLIN-TAZOBACTAM 3.375 G IVPB
3.3750 g | Freq: Three times a day (TID) | INTRAVENOUS | Status: DC
  Administered 2023-08-17 – 2023-08-19 (×5): 3.375 g via INTRAVENOUS
  Filled 2023-08-17 (×6): qty 50

## 2023-08-17 MED ORDER — PANTOPRAZOLE SODIUM 40 MG PO TBEC
40.0000 mg | DELAYED_RELEASE_TABLET | Freq: Every day | ORAL | Status: DC
  Administered 2023-08-18 – 2023-08-26 (×9): 40 mg via ORAL
  Filled 2023-08-17 (×9): qty 1

## 2023-08-17 MED ORDER — ATORVASTATIN CALCIUM 20 MG PO TABS
40.0000 mg | ORAL_TABLET | Freq: Every day | ORAL | Status: DC
Start: 2023-08-17 — End: 2023-08-26
  Administered 2023-08-18 – 2023-08-26 (×9): 40 mg via ORAL
  Filled 2023-08-17 (×10): qty 2

## 2023-08-17 MED ORDER — ENOXAPARIN SODIUM 30 MG/0.3ML IJ SOSY
30.0000 mg | PREFILLED_SYRINGE | INTRAMUSCULAR | Status: DC
  Administered 2023-08-17 – 2023-08-25 (×9): 30 mg via SUBCUTANEOUS
  Filled 2023-08-17 (×9): qty 0.3

## 2023-08-17 MED ORDER — ACETAMINOPHEN 500 MG PO TABS
1000.0000 mg | ORAL_TABLET | Freq: Once | ORAL | Status: AC
  Administered 2023-08-17: 1000 mg via ORAL
  Filled 2023-08-17: qty 2

## 2023-08-17 MED ORDER — ASPIRIN 81 MG PO TBEC
81.0000 mg | DELAYED_RELEASE_TABLET | Freq: Every day | ORAL | Status: DC
  Administered 2023-08-18 – 2023-08-26 (×9): 81 mg via ORAL
  Filled 2023-08-17 (×9): qty 1

## 2023-08-17 NOTE — Consult Note (Signed)
 CODE SEPSIS - PHARMACY COMMUNICATION  **Broad Spectrum Antibiotics should be administered within 1 hour of Sepsis diagnosis**  Time Code Sepsis Called/Page Received: 1641  Antibiotics Ordered: cefepime, vancomycin and Flagyl  Time of 1st antibiotic administration: 1718  Additional action taken by pharmacy: N/A  Barrie Folk ,PharmD Clinical Pharmacist  08/17/2023  4:47 PM

## 2023-08-17 NOTE — Assessment & Plan Note (Signed)
   Elevated troponin No chest pain likely secondary to the patient's CKD troponin was 24 -> 19 ECG is normal sinus rhythm severely reviewed and interpreted with ventricular rate of 82 no ST changes QTc 422

## 2023-08-17 NOTE — Assessment & Plan Note (Signed)
   Transaminitis ALT 63 alk phos 134 Right upper quadrant ultrasound and acute hepatitis panel Covering with Zosyn pending investigations

## 2023-08-17 NOTE — Assessment & Plan Note (Signed)
  Gastroesophageal reflux disease Continue the patient's PPI Given her chronic kidney disease suggest ongoing shared decision making with outpatient provider

## 2023-08-17 NOTE — Assessment & Plan Note (Signed)
   Hypocalcemia Corrected calcium 8.6 with albumin of 2.9 and calcium of 8.1 providing supplemental calcium

## 2023-08-17 NOTE — ED Triage Notes (Addendum)
 Pt from home via EMS- pt has had strep for 5 days, per family pt has been gradually getting weaker and isn't eating as much. Pt reports diarrhea, is currently on antibiotics for strep. Pt is AOX4, NAD noted. Pt c/o body aches, sore throat, and abdominal pain.   85 HR  95% RA 132/62 100.3 oral 153- BGL  Pt had 1 gm tylenol at 11 am today

## 2023-08-17 NOTE — Assessment & Plan Note (Signed)
  Group A strep pharyngitis Reportedly had a rapid test as outpatient although this cannot be collaborated, have ordered a rapid PCR and then given worsening symptoms have ordered a CT of the neck to rule out any large abscess or phlegmon Do not appreciate any signs of the same on examination, completed amoxicillin as outpatient

## 2023-08-17 NOTE — Assessment & Plan Note (Signed)
  Sepsis Patient presents with worsening fever Tmax at home was 101.2 Fahrenheit WBC 15.3 along with AKI on CKD Probable sources would be worsening of her known pharyngitis versus intra-abdominal source (alkaline phosphatase 134, ALT 63 and hunger pains) or C. difficile given she has new onset diarrhea after starting antibiotics along with new leukocytosis Lactic acid of note was 1.3 and 0.8 respectively, blood cultures obtained Of no respiratory panel negative UA was clear chest x-ray is clear Plan: Follow outstanding blood cultures, start Zosyn, right upper quadrant ultrasound, CT of the neck, group A strep PCR

## 2023-08-17 NOTE — Assessment & Plan Note (Signed)
    Acute kidney injury on chronic kidney disease stage IIIb Patient has a creatinine of 1.79 up from 1.46 on 12 3 Has received 1 L crystalloid, following the sodium avoiding nephrotoxins, if does not improve rapidly with crystalloid we will pursue additional investigations

## 2023-08-17 NOTE — Progress Notes (Signed)
 Pharmacy Antibiotic Note  Mao Lockner is a 78 y.o. adult admitted on 08/17/2023 with  Intra-abdominal infection .  Pharmacy has been consulted for Zosyn dosing.  Plan: Zosyn 3.375g IV q8h (4 hour infusion). Follow up on SCr with AM labs  Height: 5' (152.4 cm) Weight: 57.6 kg (127 lb) IBW/kg (Calculated) : 45.5  Temp (24hrs), Avg:99.9 F (37.7 C), Min:99 F (37.2 C), Max:100.8 F (38.2 C)  Recent Labs  Lab 08/17/23 1538 08/17/23 1738  WBC 15.3*  --   CREATININE 1.79*  --   LATICACIDVEN 1.3 0.8    Estimated Creatinine Clearance (by C-G formula based on SCr of 1.79 mg/dL (H)) Female: 16.1 mL/min (A) Female: 23.7 mL/min (A)    Allergies  Allergen Reactions   Metformin And Related Diarrhea    Antimicrobials this admission: Zosyn 3/29 >>    Dose adjustments this admission:  Microbiology results:  Thank you for allowing pharmacy to be a part of this patient's care.  Clovia Cuff, PharmD, BCPS 08/17/2023 8:38 PM

## 2023-08-17 NOTE — Assessment & Plan Note (Signed)
   Hyponatremia Patient has a sodium of 127 previously 139 on 12/3 Suspect this is acute and therefore at low risk for demyelination due to the sepsis and decreased appetite Patient received 1 L crystalloid during her initial presentation in the ER we will repeat BMP and then provide further volume resuscitation following this

## 2023-08-17 NOTE — Sepsis Progress Note (Signed)
 Elink will follow per sepsis protocol.

## 2023-08-17 NOTE — H&P (Signed)
 History and Physical    Patient: Crystal Miller WGN:[id number] DOB: Jun 17, 1945 DOA: 08/17/2023 DOS: the patient was seen and examined on 08/17/2023 PCP: Glori Luis, MD (Inactive)  Patient coming from: Home  Chief Complaint:  Chief Complaint  Patient presents with   Weakness   HPI:   78 year old female with a past medical history of cerebrovascular accident hypertension gastroesophageal reflux disease close recent diagnosis of group A streptococcal pharyngitis in the outpatient setting 3 days ago presents to the emergency department with worsening fever chills malaise anorexia and diarrhea.  The patient lives with the patient's daughter who is at the bedside during examination (Chrischelle).  The patient developed a sore throat 3 days ago subsequently went to the outpatient provider who reportedly did rapid strep testing which was positive was provided amoxicillin subsequently came worsening febrile at home with worsening malaise decreasing appetite with diarrhea nonbloody no melena no hematochezia.  The patient was taking amoxicillin as outpatient is able to handle all saliva and secretions and swallow.  She denies any vomiting when specifically queried about abdominal pain she says they are "hunger pains" but the patient cannot characterize further.  There is no records available from the outpatient visit collaborating the rapid strep test past medical records reviewed and summarized the patient's outpatient visit on 05/01/2023 patient visited her nephrologist at that time noted to have stage IIIb chronic kidney disease type 2 diabetes mellitus last A1c was 6.2  Review of Systems:   Constitutional: Reports Fever, Chills Eyes: Denies Blurry Vision, Eye Pain or Decreased Vision ENT: Reports Sore Throat but no Tinnitus, Hearing Loss Cardiovascular: Denies Chest Pain, Paroxsymal Nocturnal Dyspnea, Palpitations, Edema Gastrointestinal: Denies Nausea, Vomiting but reports Diarrhea no  Hematemesis, Melena Allergy/Immun: Denies history of Hay Fever, Positive PPD or Hives All other systems were reviewed and are negative  Past Medical History:  Diagnosis Date   Acute pyelonephritis 08/05/18   Depression    husband died in 08-29-15   DM (diabetes mellitus), 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   Essential hypertension 11/01/2016   GERD (gastroesophageal reflux disease)    Kidney stones    Mixed hyperlipidemia 11/01/2016   Peripheral vascular disease (HCC)    Renal insufficiency    Right pontine stroke (HCC) 03/27/2017   Stroke (HCC) August 28, 2016   slight residual with speech. does not drive Lt side weakness   Past Surgical History:  Procedure Laterality Date   KIDNEY STONE SURGERY     LOWER EXTREMITY ANGIOGRAPHY Left 12/31/2017   Procedure: LOWER EXTREMITY ANGIOGRAPHY;  Surgeon: Renford Dills, MD;  Location: [facility] INVASIVE CV LAB;  Service: Cardiovascular;  Laterality: Left;   LOWER EXTREMITY ANGIOGRAPHY Right 04/22/2018   Procedure: LOWER EXTREMITY ANGIOGRAPHY;  Surgeon: Renford Dills, MD;  Location: [facility] INVASIVE CV LAB;  Service: Cardiovascular;  Laterality: Right;   Social History:  reports that [name] quit smoking about 13 years ago. [name]'s smoking use included cigarettes. [name] started smoking about 33 years ago. [name] has a 20 pack-year smoking history. [name] has never used smokeless tobacco. [name] reports that [name] does not drink alcohol and does not use drugs.  Allergies  Allergen Reactions   Metformin And Related Diarrhea    Family History  Problem Relation Age of Onset   Alzheimer's disease Mother    Arthritis Mother    Hypertension Mother    CVA Father    CAD Father    Heart disease Father    Stroke Father    Hypertension Father  Arthritis Sister    Stroke Brother    Diabetes Brother    Heart disease Brother    Bone cancer Brother    Lung cancer Brother    Diabetes Son    Breast cancer Neg Hx     Prior to Admission medications    Medication Sig Start Date End Date Taking? Authorizing Provider  acetaminophen (TYLENOL) 500 MG tablet Take 1,000 mg by mouth every 6 (six) hours as needed (for pain.).    [provider]  amLODipine (NORVASC) 10 MG tablet Take 1 tablet by mouth once daily 06/03/23   Glori Luis, MD  aspirin EC 81 MG EC tablet Take 1 tablet (81 mg total) by mouth daily. 10/03/16   Altamese Dilling, MD  atorvastatin (LIPITOR) 40 MG tablet TAKE 1 TABLET BY MOUTH ONCE DAILY AT 6PM 05/21/23   Glori Luis, MD  blood glucose meter kit and supplies KIT Dispense based on patient and insurance preference. Use up to four times daily as directed. (FOR ICD-9 250.00, 250.01). 10/05/16   Schaevitz, Myra Rude, MD  calcium carbonate (OS-CAL) 600 MG tablet Take 600 mg by mouth daily.    [provider]  carvedilol (COREG) 6.25 MG tablet TAKE 1 TABLET BY MOUTH TWICE DAILY WITH A MEAL 07/08/23   Glori Luis, MD  diclofenac Sodium (VOLTAREN) 1 % GEL Apply 2 g topically 4 (four) times daily.    [provider]  doxycycline (VIBRA-TABS) 100 MG tablet Take 1 tablet (100 mg total) by mouth 2 (two) times daily. 12/10/22   Glori Luis, MD  Dulaglutide (TRULICITY) 0.75 MG/0.5ML SOAJ Inject 0.75 mg into the skin once a week. 08/05/23   Bethanie Dicker, NP  Multiple Vitamin (MULTIVITAMIN WITH MINERALS) TABS tablet Take 1 tablet by mouth daily. Centrum Silver . In the morning    [provider]  omeprazole (PRILOSEC) 20 MG capsule Take 1 capsule (20 mg total) by mouth daily. Please schedule an office visit for further refills. Thank you 07/29/23   Esterwood, Amy S, PA-C  oxybutynin (DITROPAN-XL) 5 MG 24 hr tablet  12/11/21   [provider]  Polyethyl Glycol-Propyl Glycol 0.4-0.3 % SOLN Place 1 drop into both eyes 3 (three) times daily as needed (for dry eyes.).    [provider]  sertraline (ZOLOFT) 25 MG tablet Take 1 tablet by mouth once daily 07/08/23    Glori Luis, MD    Physical Exam: Vitals:   08/17/23 2000 08/17/23 2032 08/17/23 2100 08/17/23 2115  BP: (!) 114/46  (!) 118/53   Pulse: 76   76  Resp: 15   16  Temp:      TempSrc:      SpO2: 93%   95%  Weight:  57.6 kg    Height:  5' (1.524 m)    Patient seen and examined 7:56 PM Room 44 of the ED Constitutional:  Vital Signs as per [facility] than three noted] No Acute Distress Very Dry Mucous Membranes  Eyes:  Pink Conjunctiva and no Ptosis Neck:     Trachea Midline, Neck Symmetric             Thyroid without tenderness, palpable masses or nodules (No Exudates, Anterior Cervical nodes found) Respiratory:   Respiratory Effort Normal: No Use of Respiratory Muscles,No  Intercostal Retractions             Lungs Clear to Auscultation Bilaterally Cardiovascular:   Heart Auscultated: Regular Regular without any added sounds or  murmurs              No Lower Extremity Edema Gastrointestinal:  Abdomen soft and nontender without palpable masses, guarding or rebound  No Palpable Splenomegaly or Hepatomegaly Neurological:  Moves all 4 limbs to gravity Psychiatric:  Patient Orientated to Place and Person Patient with blunted mood and affect  Data Reviewed: Labs, Radiology, ECG as detailed in HPI and A/P   Assessment and Plan: * Sepsis (HCC)  Sepsis Patient presents with worsening fever Tmax at home was 101.2 Fahrenheit WBC 15.3 along with AKI on CKD Probable sources would be worsening of her known pharyngitis versus intra-abdominal source (alkaline phosphatase 134, ALT 63 and hunger pains) or C. difficile given she has new onset diarrhea after starting antibiotics along with new leukocytosis Lactic acid of note was 1.3 and 0.8 respectively, blood cultures obtained Of no respiratory panel negative UA was clear chest x-ray is clear Plan: Follow outstanding blood cultures, start Zosyn, right upper quadrant ultrasound, CT of the neck, group A strep  PCR  Pharyngitis  Group A strep pharyngitis Reportedly had a rapid test as outpatient although this cannot be collaborated, have ordered a rapid PCR and then given worsening symptoms have ordered a CT of the neck to rule out any large abscess or phlegmon Do not appreciate any signs of the same on examination, completed amoxicillin as outpatient  Hyponatremia   Hyponatremia Patient has a sodium of 127 previously 139 on 12/3 Suspect this is acute and therefore at low risk for demyelination due to the sepsis and decreased appetite Patient received 1 L crystalloid during her initial presentation in the ER we will repeat BMP and then provide further volume resuscitation following this   AKI (acute kidney injury) (HCC)    Acute kidney injury on chronic kidney disease stage IIIb Patient has a creatinine of 1.79 up from 1.46 on 12 3 Has received 1 L crystalloid, following the sodium avoiding nephrotoxins, if does not improve rapidly with crystalloid we will pursue additional investigations  C. difficile colitis   Suspected C. difficile colitis Patient has vague abdominal pain acute onset diarrhea after starting antibiotics along with new leukocytosis differential diagnosis would be amoxicillin drug effect versus C. difficile PCR has been ordered   Elevated troponin   Elevated troponin No chest pain likely secondary to the patient's CKD troponin was 24 -> 19 ECG is normal sinus rhythm severely reviewed and interpreted with ventricular rate of 82 no ST changes QTc 422       Transaminitis   Transaminitis ALT 63 alk phos 134 Right upper quadrant ultrasound and acute hepatitis panel Covering with Zosyn pending investigations   Hypocalcemia   Hypocalcemia Corrected calcium 8.6 with albumin of 2.9 and calcium of 8.1 providing supplemental calcium   GERD (gastroesophageal reflux disease)  Gastroesophageal reflux disease Continue the patient's PPI Given her chronic  kidney disease suggest ongoing shared decision making with outpatient provider   CVA (cerebral vascular accident) (HCC) CVA by history No residual neurological deficits, no new symptoms Continue the patient's home aspirin and atorvastatin 40 mg  Mood disorder (HCC)  Mood disorder Continue sertraline 25 mg daily Currently blunted mood  No Homicidal or Suicidal Ideation  Essential hypertension  Hypertension Holding the patient's home amlodipine and Coreg given her acute illness and risk for ATN      Advance Care Planning:   Code Status: Limited: Do not attempt resuscitation (DNR) -DNR-LIMITED -Do Not Intubate/DNI   Patient is DO NOT RESUSCITATE DO NOT  INTUBATE as per the patient herself as well as the daughter  Consults: None at this time   Family Communication: Daughter at Bedside  Severity of Illness: The appropriate patient status for this patient is INPATIENT. Inpatient status is judged to be reasonable and necessary in order to provide the required intensity of service to ensure the patient's safety. The patient's presenting symptoms, physical exam findings, and initial radiographic and laboratory data in the context of their chronic comorbidities is felt to place them at high risk for further clinical deterioration. Furthermore, it is not anticipated that the patient will be medically stable for discharge from the hospital within 2 midnights of admission.   * I certify that at the point of admission it is my clinical judgment that the patient will require inpatient hospital care spanning beyond 2 midnights from the point of admission due to high intensity of service, high risk for further deterioration and high frequency of surveillance required.*  Author: Princess Bruins, MD 08/17/2023 9:37 PM  For on call review [email or web address].

## 2023-08-17 NOTE — Assessment & Plan Note (Signed)
  Mood disorder Continue sertraline 25 mg daily Currently blunted mood  No Homicidal or Suicidal Ideation

## 2023-08-17 NOTE — Assessment & Plan Note (Signed)
   Suspected C. difficile colitis Patient has vague abdominal pain acute onset diarrhea after starting antibiotics along with new leukocytosis differential diagnosis would be amoxicillin drug effect versus C. difficile PCR has been ordered

## 2023-08-17 NOTE — Assessment & Plan Note (Signed)
 CVA by history No residual neurological deficits, no new symptoms Continue the patient's home aspirin and atorvastatin 40 mg

## 2023-08-17 NOTE — Assessment & Plan Note (Signed)
  Hypertension Holding the patient's home amlodipine and Coreg given her acute illness and risk for ATN

## 2023-08-17 NOTE — ED Provider Notes (Signed)
 [facility] Provider Note    Event Date/Time   First MD Initiated Contact with Patient 08/17/23 1535     (approximate)   History   Weakness   HPI  Crystal Miller is a 78 y.o. adult  with history of DM, HTN, GERD, CKD, CVA  and as listed in EMR presents to the emergency department for evaluation of sore throat, fever, decreased appetite, weakness, an diarrhea. She was diagnosed with strep throat and has been on antibiotics for 5 days. Symptoms have been been progressively worsening. Last Tylenol was at 11am today. Patient denies chest pain, shortness of breath, swelling, or rash. At baseline she is incontinent and feels she may have a UTI. She also reports she feels "raw" in her pelvic area and has been using "Balm-ex" without relief.      Physical Exam   Triage Vital Signs: ED Triage Vitals  Encounter Vitals Group     BP --      Systolic BP Percentile --      Diastolic BP Percentile --      Pulse --      Resp 08/17/23 1532 18     Temp 08/17/23 1532 (!) 100.8 F (38.2 C)     Temp Source 08/17/23 1532 Oral     SpO2 --      Weight --      Height --      Head Circumference --      Peak Flow --      Pain Score 08/17/23 1533 5     Pain Loc --      Pain Education --      Exclude from Growth Chart --     Most recent vital signs: Vitals:   08/17/23 1532  Resp: 18  Temp: (!) 100.8 F (38.2 C)    General: Awake, no distress.  CV:  Good peripheral perfusion.  Resp:  Normal effort. Breath sounds clear Abd:  No distention. Soft. Non-tender Other:  Mucus membranes dry. Posterior oropharynx erythematous. No airway edema. Maintaining secretions without difficulty.   Pelvic area and vaginal area scalded appearing with white exudate.   ED Results / Procedures / Treatments   Labs (all labs ordered are listed, but only abnormal results are displayed) Labs Reviewed - No data to display   EKG  NSR rate of 82   RADIOLOGY  Image and radiology  report reviewed and interpreted by me. Radiology report consistent with the same.  Chest x-ray without acute cardiopulmonary abnormality  PROCEDURES:  Critical Care performed: Yes, see critical care procedure note(s)  Procedures   MEDICATIONS ORDERED IN ED:  Medications - No data to display   IMPRESSION / MDM / ASSESSMENT AND PLAN / ED COURSE   I have reviewed the triage note.  Differential diagnosis includes, but is not limited to, sepsis of unknown origin, UTI/urospesis, covid, influenza, strep throat  Patient's presentation is most consistent with acute presentation with potential threat to life or bodily function.  78 year old female presenting to the ER for evaluation of fever, weakness, sore throat, and decreased appetite.  She was diagnosed with strep throat on the 26th but has not improved with amoxicillin.   Triage note indicated she had complained of abdominal pain and diarrhea however the patient denies these symptoms to me and states that she feels hungry.  On exam mucous membranes are dry.  Posterior oropharynx is erythematous, uvula is midline. Voice is clear. No anterior cervical adenopathy.  Breath  sounds are clear.  Abdomen is soft and nontender to palpation.   ----------------------------------------- 4:51 PM on 08/17/2023 ----------------------------------------- CBC shows a white count of 15.3 with a neutrophil count of 13.4 and monocyte count of 1.1.  Hemoglobin is 10.5 with a hematocrit of 30.4 which appears to be at baseline.  Hyponatremic at 127, nonfasting glucose of 120.  BUN of 41 with a creatinine 1.79 and GFR of 28, which above baseline.  ALT is 63 with an alkaline phosphatase of 134, also above patient's baseline.  Urinalysis shows rare bacteria but otherwise not concerning for acute cystitis.  Initial troponin was 20.  Patient denies chest pain or shortness of breath.  ECG shows a normal sinus rhythm with a rate of 82. Second troponin pending.  Based  on white blood cell count of 15.3 and initial temperature of 100.3+ mild tachypnea, sepsis order set entered.  Patient states that she was negative for COVID and influenza however I do not see that result so another ordered.  ----------------------------------------- 7:38 PM on 08/17/2023 -----------------------------------------  COVID, influenza, and RSV are negative.  Second troponin as 19.  Patient will be admitted for potential sepsis and dehydration.  Hospitalist consult requested.       FINAL CLINICAL IMPRESSION(S) / ED DIAGNOSES   Final diagnoses:  None     Rx / DC Orders   ED Discharge Orders     None        Note:  This document was prepared using Dragon voice recognition software and may include unintentional dictation errors.   Crystal Pester, FNP 08/17/23 2154    Crystal Lima, MD 08/18/23 [phone]

## 2023-08-18 DIAGNOSIS — J020 Streptococcal pharyngitis: Secondary | ICD-10-CM | POA: Diagnosis not present

## 2023-08-18 LAB — C DIFFICILE QUICK SCREEN W PCR REFLEX
C Diff antigen: POSITIVE — AB
C Diff interpretation: DETECTED
C Diff toxin: POSITIVE — AB

## 2023-08-18 LAB — CBC
HCT: 26.3 % — ABNORMAL LOW (ref 36.0–46.0)
Hemoglobin: 9 g/dL — ABNORMAL LOW (ref 12.0–15.0)
MCH: 31.4 pg (ref 26.0–34.0)
MCHC: 34.2 g/dL (ref 30.0–36.0)
MCV: 91.6 fL (ref 80.0–100.0)
Platelets: 200 10*3/uL (ref 150–400)
RBC: 2.87 MIL/uL — ABNORMAL LOW (ref 3.87–5.11)
RDW: 12.2 % (ref 11.5–15.5)
WBC: 12.2 10*3/uL — ABNORMAL HIGH (ref 4.0–10.5)
nRBC: 0 % (ref 0.0–0.2)

## 2023-08-18 LAB — HEPATITIS PANEL, ACUTE
HCV Ab: NONREACTIVE
Hep A IgM: NONREACTIVE
Hep B C IgM: NONREACTIVE
Hepatitis B Surface Ag: NONREACTIVE

## 2023-08-18 LAB — GASTROINTESTINAL PANEL BY PCR, STOOL (REPLACES STOOL CULTURE)

## 2023-08-18 LAB — COMPREHENSIVE METABOLIC PANEL WITH GFR
ALT: 48 U/L — ABNORMAL HIGH (ref 0–44)
AST: 39 U/L (ref 15–41)
Albumin: 2.2 g/dL — ABNORMAL LOW (ref 3.5–5.0)
Alkaline Phosphatase: 104 U/L (ref 38–126)
Anion gap: 7 (ref 5–15)
BUN: 33 mg/dL — ABNORMAL HIGH (ref 8–23)
CO2: 18 mmol/L — ABNORMAL LOW (ref 22–32)
Calcium: 7 mg/dL — ABNORMAL LOW (ref 8.9–10.3)
Chloride: 108 mmol/L (ref 98–111)
Creatinine, Ser: 1.4 mg/dL — ABNORMAL HIGH (ref 0.44–1.00)
GFR, Estimated: 38 mL/min — ABNORMAL LOW (ref >60)
Glucose, Bld: 94 mg/dL (ref 70–99)
Potassium: 3.7 mmol/L (ref 3.5–5.1)
Sodium: 133 mmol/L — ABNORMAL LOW (ref 135–145)
Total Bilirubin: 0.6 mg/dL (ref 0.0–1.2)
Total Protein: 5.4 g/dL — ABNORMAL LOW (ref 6.5–8.1)

## 2023-08-18 LAB — MAGNESIUM: Magnesium: 1.9 mg/dL (ref 1.7–2.4)

## 2023-08-18 LAB — PHOSPHORUS: Phosphorus: 2 mg/dL — ABNORMAL LOW (ref 2.5–4.6)

## 2023-08-18 MED ORDER — ACETAMINOPHEN 325 MG PO TABS
650.0000 mg | ORAL_TABLET | Freq: Four times a day (QID) | ORAL | Status: DC | PRN
  Administered 2023-08-18 – 2023-08-21 (×5): 650 mg via ORAL
  Filled 2023-08-18 (×5): qty 2

## 2023-08-18 MED ORDER — PHENOL 1.4 % MT LIQD
1.0000 | OROMUCOSAL | Status: DC | PRN
Start: 2023-08-18 — End: 2023-08-26

## 2023-08-18 MED ORDER — NYSTATIN 100000 UNIT/GM EX POWD
Freq: Two times a day (BID) | CUTANEOUS | Status: DC
  Administered 2023-08-21: 1 via TOPICAL
  Filled 2023-08-18 (×2): qty 15

## 2023-08-18 NOTE — Evaluation (Signed)
 Occupational Therapy Evaluation Patient Details Name: Crystal Miller MRN: [id number] DOB: Dec 26, 1945 Today's Date: 08/18/2023   History of Present Illness   78 year old female presents with worsening fever, chills, malaise, anorexia and diarrhea. Admitted for management of sepsis, pharyngitis, hyponatremia and AKI. PMH of CVA, hypertension, GERD, close recent diagnosis of group A streptococcal pharyngitis in the outpatient setting 3 days ago.     Clinical Impressions Pt was seen for OT evaluation this date. Prior to hospital admission, pt was living at home with her daughter and grandson. Reports MOD I/IND at baseline using rollator for community and household distances. 1 fall reported 3 months ago when cleaning the tub and falling over. IND with ADL/IADL management and is left alone during the day while daughter is at work and grandson is at school.  Pt presents to acute OT demonstrating impaired ADL performance and functional mobility 2/2 weakness, balance deficits, and low activity tolerance. She denies pain. Pt currently requires Min A for supine to sit, Mod A to forward scoot to EOB. Cueing required for sequencing. Pt attempted to don socks and underwear, but ultimately needed Max A for LB dressing tasks. STS from EOB to RW performed requiring cueing for hand placement and Min/Mod A with very limited standing tolerance. Handoff to PT for further mobility assessment. STR recommended on DC d/t significant deficits from her baseline with pt/family in agreement. Pt would benefit from skilled OT services to address noted impairments and functional limitations (see below for any additional details) in order to maximize safety and independence while minimizing falls risk and caregiver burden. Do anticipate the need for follow up OT services upon acute hospital DC.      If plan is discharge home, recommend the following:   A little help with walking and/or transfers;A lot of help with  bathing/dressing/bathroom;Assistance with cooking/housework;Assist for transportation;Help with stairs or ramp for entrance     Functional Status Assessment   Patient has had a recent decline in their functional status and demonstrates the ability to make significant improvements in function in a reasonable and predictable amount of time.     Equipment Recommendations   None recommended by OT;Other (comment) (defer)     Recommendations for Other Services         Precautions/Restrictions   Precautions Precautions: Fall Restrictions Weight Bearing Restrictions Per Provider Order: No     Mobility Bed Mobility Overal bed mobility: Needs Assistance Bed Mobility: Supine to Sit     Supine to sit: Min assist, Used rails, HOB elevated     General bed mobility comments: cueing for bed rail use and initiation; Mod A to forward scoot bil hips to EOB to prepare for standing    Transfers Overall transfer level: Needs assistance Equipment used: Rolling walker (2 wheels) Transfers: Sit to/from Stand Sit to Stand: Min assist, Mod assist           General transfer comment: Min/Mod A for STS from EOB to RW with <10 second stand tolerance to don mesh panties      Balance Overall balance assessment: Needs assistance Sitting-balance support: Bilateral upper extremity supported, Feet unsupported Sitting balance-Leahy Scale: Fair Sitting balance - Comments: mildy unsteady attempting to don socks at EOB   Standing balance support: Reliant on assistive device for balance, Bilateral upper extremity supported Standing balance-Leahy Scale: Poor Standing balance comment: Min/mod A and dependence on UEs on walker to maintain standing balance with forward flexed posture  ADL either performed or assessed with clinical judgement   ADL Overall ADL's : Needs assistance/impaired                     Lower Body Dressing: Maximal  assistance;Sit to/from stand;Sitting/lateral leans Lower Body Dressing Details (indicate cue type and reason): to don mesh panties and bil socks                     Vision         Perception         Praxis         Pertinent Vitals/Pain Pain Assessment Pain Assessment: No/denies pain     Extremity/Trunk Assessment Upper Extremity Assessment Upper Extremity Assessment: Generalized weakness   Lower Extremity Assessment Lower Extremity Assessment: Generalized weakness       Communication Communication Communication: No apparent difficulties   Cognition Arousal: Alert Behavior During Therapy: WFL for tasks assessed/performed Cognition: No apparent impairments                               Following commands: Intact       Cueing  General Comments   Cueing Techniques: Verbal cues  redness noted to vaginal/buttocks area with a lot of cream applied; purewick removed with NT to place another   Exercises Other Exercises Other Exercises: Edu on role of OT in acute setting and DC recommendations for rehab.   Shoulder Instructions      Home Living Family/patient expects to be discharged to:: Private residence Living Arrangements: Children (daughter and grandson) Available Help at Discharge: Family;Available PRN/intermittently Type of Home: Apartment Home Access: Level entry     Home Layout: One level     Bathroom Shower/Tub: Chief Strategy Officer: Standard     Home Equipment: Educational psychologist (4 wheels);Toilet riser;BSC/3in1          Prior Functioning/Environment Prior Level of Function : Independent/Modified Independent;History of Falls (last six months)             Mobility Comments: ambulates community and household distances with rollator; reports walks around her apartment complex weather permitting and takes seated breaks on rollator; 1 fall into tub while cleaning it 3 months ago ADLs Comments: IND/MOD I  ADL, cooks, does laundry, cleaning, etc.; manages her own meds    OT Problem List: Decreased strength;Decreased activity tolerance;Impaired balance (sitting and/or standing)   OT Treatment/Interventions: Self-care/ADL training;Balance training;Therapeutic exercise;Therapeutic activities;Energy conservation;DME and/or AE instruction;Patient/family education      OT Goals(Current goals can be found in the care plan section)   Acute Rehab OT Goals Patient Stated Goal: improve strength OT Goal Formulation: With patient/family Time For Goal Achievement: 09/01/23 Potential to Achieve Goals: Good ADL Goals Pt Will Perform Lower Body Bathing: with contact guard assist;with min assist;sit to/from stand;sitting/lateral leans Pt Will Perform Lower Body Dressing: with min assist;with contact guard assist;sit to/from stand;sitting/lateral leans Pt Will Transfer to Toilet: with min assist;with contact guard assist;ambulating;regular height toilet;bedside commode Pt Will Perform Toileting - Clothing Manipulation and hygiene: with contact guard assist;with min assist;sit to/from stand;sitting/lateral leans   OT Frequency:  Min 2X/week    Co-evaluation              AM-PAC OT "6 Clicks" Daily Activity     Outcome Measure Help from another person eating meals?: None Help from another person taking care of personal grooming?: None Help from another  person toileting, which includes using toliet, bedpan, or urinal?: A Lot Help from another person bathing (including washing, rinsing, drying)?: A Lot Help from another person to put on and taking off regular upper body clothing?: A Little Help from another person to put on and taking off regular lower body clothing?: A Lot 6 Click Score: 17   End of Session Equipment Utilized During Treatment: Rolling walker (2 wheels) Nurse Communication: Mobility status  Activity Tolerance: Patient tolerated treatment well Patient left:  (handoff to PT)  OT  Visit Diagnosis: Other abnormalities of gait and mobility (R26.89);Muscle weakness (generalized) (M62.81);Unsteadiness on feet (R26.81)                Time: 0942-1000 OT Time Calculation (min): 18 min Charges:  OT General Charges $OT Visit: 1 Visit OT Evaluation $OT Eval Moderate Complexity: 1 Mod Ashonte Angelucci, OTR/L 08/18/23, 10:56 AM  Lucresia Simic E Amritpal Shropshire 08/18/2023, 10:45 AM

## 2023-08-18 NOTE — ED Notes (Signed)
 Assumed patient care at approximately 0715 and received report from the previous nurse.

## 2023-08-18 NOTE — Evaluation (Signed)
 Physical Therapy Evaluation Patient Details Name: Chrysten Woulfe MRN: [id number] DOB: 24-Feb-1946 Today's Date: 08/18/2023  History of Present Illness  78 year old female presents with worsening fever, chills, malaise, anorexia and diarrhea. Admitted for management of sepsis, pharyngitis, hyponatremia and AKI. PMH of CVA, hypertension, GERD, close recent diagnosis of group A streptococcal pharyngitis in the outpatient setting 3 days ago.  Clinical Impression  Pt received sitting on the EOB with OT as OT was completing her Eval. Pt agreed to continue with the PT evaluation. Pt A and Ox 4 pleasant and motivated. PLOF is Ambulatory with 4WW at household level and community level and uses stairs to socialize Independently. Pt stated desire to get stronger and better so that she can return being alone at home during the day and socialize like before. Pt reported of feeling very tired by the end of the short session. Pt need min to mod assist for transfer and side stepping, max assist to become comfortable in bed. Pt Follows Commands 100% of the time. However, has difficulty clearing floor to perform forward ambulation. Mobility specialist referred to improve rehab outcome and PT will continue in acute. Pt will benefit form continued Rehab beyond acute confirmed by [facility] Score.       If plan is discharge home, recommend the following: A lot of help with walking and/or transfers;A lot of help with bathing/dressing/bathroom;Assistance with cooking/housework;Direct supervision/assist for medications management;Direct supervision/assist for financial management;Assist for transportation;Help with stairs or ramp for entrance   Can travel by private vehicle        Equipment Recommendations None recommended by PT  Recommendations for Other Services       Functional Status Assessment Patient has had a recent decline in their functional status and demonstrates the ability to make significant improvements in  function in a reasonable and predictable amount of time.     Precautions / Restrictions Precautions Precautions: Fall Recall of Precautions/Restrictions: Intact Restrictions Weight Bearing Restrictions Per Provider Order: No      Mobility  Bed Mobility Overal bed mobility: Needs Assistance             General bed mobility comments: pt received sitting on EOB with OT.    Transfers Overall transfer level: Needs assistance Equipment used: Rolling walker (2 wheels) Transfers: Sit to/from Stand Sit to Stand: Min assist, Mod assist           General transfer comment: Min/Mod A for STS from EOB to RW with <10 second stand tolerance to don mesh panties    Ambulation/Gait Ambulation/Gait assistance: Min assist Gait Distance (Feet): 4 Feet Assistive device: Rolling walker (2 wheels)   Gait velocity: dec     General Gait Details: side stepping at the EOB, with poor psoture and diffulty liftinh BLE L>R.  Stairs            Wheelchair Mobility     Tilt Bed    Modified Rankin (Stroke Patients Only)       Balance Overall balance assessment: Independent Sitting-balance support: Bilateral upper extremity supported, Feet unsupported Sitting balance-Leahy Scale: Fair Sitting balance - Comments: dynamic control reduced.   Standing balance support: Reliant on assistive device for balance, Bilateral upper extremity supported Standing balance-Leahy Scale: Poor Standing balance comment: Min/mod A and dependence on UEs on walker to maintain standing balance with forward flexed posture  Pertinent Vitals/Pain Pain Assessment Pain Assessment: No/denies pain    Home Living Family/patient expects to be discharged to:: Private residence Living Arrangements: Children Available Help at Discharge: Family;Available PRN/intermittently Type of Home: Apartment Home Access: Level entry       Home Layout: One level Home Equipment:  Educational psychologist (4 wheels);Toilet riser;BSC/3in1      Prior Function Prior Level of Function : Independent/Modified Independent;History of Falls (last six months)             Mobility Comments: ambulates community and household distances with rollator; reports walks around her apartment complex weather permitting and takes seated breaks on rollator; uses a flight of stairs to access her relative home which she did a week ago.  1 fall into tub while cleaning it 3 months ago ADLs Comments: IND/MOD I ADL, cooks, does laundry, cleaning, etc.; manages her own meds     Extremity/Trunk Assessment   Upper Extremity Assessment Upper Extremity Assessment: Defer to OT evaluation    Lower Extremity Assessment Lower Extremity Assessment: Generalized weakness    Cervical / Trunk Assessment Cervical / Trunk Assessment: Kyphotic (forward flexed posture.)  Communication   Communication Communication: No apparent difficulties    Cognition Arousal: Alert Behavior During Therapy: WFL for tasks assessed/performed   PT - Cognitive impairments: No apparent impairments                         Following commands: Intact       Cueing Cueing Techniques: Verbal cues     General Comments General comments (skin integrity, edema, etc.): redness noted to vaginal/buttocks area with a lot of cream applied; purewick removed with NT to place another    Exercises     Assessment/Plan    PT Assessment Patient needs continued PT services  PT Problem List Decreased strength;Decreased activity tolerance;Decreased balance;Decreased mobility;Decreased coordination       PT Treatment Interventions Gait training;Functional mobility training;Stair training;Therapeutic activities;Therapeutic exercise;Balance training;Neuromuscular re-education;Patient/family education    PT Goals (Current goals can be found in the Care Plan section)  Acute Rehab PT Goals Patient Stated Goal: " I am OK to  get Rehab so I can return to being like before." PT Goal Formulation: With patient Time For Goal Achievement: 09/01/23 Potential to Achieve Goals: Good    Frequency Min 2X/week     Co-evaluation               AM-PAC PT "6 Clicks" Mobility  Outcome Measure Help needed turning from your back to your side while in a flat bed without using bedrails?: A Little Help needed moving from lying on your back to sitting on the side of a flat bed without using bedrails?: A Lot Help needed moving to and from a bed to a chair (including a wheelchair)?: A Lot Help needed standing up from a chair using your arms (e.g., wheelchair or bedside chair)?: A Little Help needed to walk in hospital room?: A Lot Help needed climbing 3-5 steps with a railing? : Total 6 Click Score: 13    End of Session Equipment Utilized During Treatment: Gait belt Activity Tolerance: Patient limited by fatigue Patient left: in bed;with bed alarm set;with family/visitor present;with call bell/phone within reach Nurse Communication: Mobility status PT Visit Diagnosis: Unsteadiness on feet (R26.81);History of falling (Z91.81);Muscle weakness (generalized) (M62.81);Difficulty in walking, not elsewhere classified (R26.2)    Time: 1000-1015 PT Time Calculation (min) (ACUTE ONLY): 15 min   Charges:   PT  Evaluation $PT Eval Low Complexity: 1 Low PT Treatments $Therapeutic Activity: 8-22 mins PT General Charges $$ ACUTE PT VISIT: 1 Visit         Janet Berlin PT DPT 12:39 PM,08/18/23

## 2023-08-18 NOTE — ED Notes (Signed)
 Pt assisted from potty back to bed. Pt had small amount of BM.

## 2023-08-18 NOTE — ED Notes (Signed)
 RN to bedside to answer call bell. Pt needs to have a BM. Pt had already had diarrhea all over herself. Pt moved to bedside toilet and cleaned up.

## 2023-08-18 NOTE — ED Notes (Signed)
 RN to bedside to introduce self to pt. Pt resting and in no acute distress.

## 2023-08-18 NOTE — Progress Notes (Addendum)
 PROGRESS NOTE  Crystal Miller    DOB: 16-Dec-1945, 78 y.o.  BJY:[id number]    Code Status: Limited: Do not attempt resuscitation (DNR) -DNR-LIMITED -Do Not Intubate/DNI    DOA: 08/17/2023   LOS: 1   Brief hospital course  Crystal Miller is a 78 y.o. adult with a PMH significant for cerebrovascular accident hypertension gastroesophageal reflux disease close recent diagnosis of group A streptococcal pharyngitis in the outpatient setting 3 days ago presents to the emergency department with worsening fever chills malaise anorexia and diarrhea.  She was treated with amoxicillin outpatient.  Group A strep testing negative upon admission.  Had poor PO intake due to odynophagia.   08/18/23 -no overt signs of infection. Follow cultures  Assessment & Plan  Principal Problem:   Sepsis (HCC) Active Problems:   Pharyngitis   Hyponatremia   AKI (acute kidney injury) (HCC)   C. difficile colitis   Essential hypertension   Mood disorder (HCC)   CVA (cerebral vascular accident) (HCC)   GERD (gastroesophageal reflux disease)   Hypocalcemia   Transaminitis   Elevated troponin  Sepsis- Tmax at home was 101.2 Fahrenheit WBC 15.3 along with AKI on CKD. no overt signs of infection.  - Follow cultures - adding diet, treat odynophagia as needed - follow cdiff - respiratory panel negative - tylenol for fever - covering for abdominal infection possibility    Group A strep pharyngitis- neg PCR on admission. Unsure if completed amoxicillin. Still symptomatic and febrile. No abnormalities on CT neck - continue amoxicillin    Hyponatremia- 127>133. Improving with PO intake and resolution of diarrhea. S/p IV fluid   AKI- closer to baseline. Cr 1.4 today   C. difficile colitis-with recent abx use. Labs pending. No tenderness to abdominal palpation    Elevated troponin No chest pain likely secondary to the patient's CKD troponin was 24 -> 19 ECG is normal sinus rhythm severely reviewed and interpreted  with ventricular rate of 82 no ST changes QTc 422   Transaminitis- neg acute hepatitis panel. Fatty liver on [place]    Hypocalcemia Corrected calcium 8.6 with albumin of 2.9 and calcium of 8.1 providing supplemental calcium   GERD - Continue the patient's PPI    CVA Continue the patient's home aspirin and atorvastatin 40 mg   Mood disorder (HCC) Continue sertraline 25 mg daily Currently blunted mood  No Homicidal or Suicidal Ideation   Essential hypertension Holding the patient's home amlodipine and Coreg given her acute illness and risk for ATN  Body mass index is 24.8 kg/m.  VTE ppx: enoxaparin (LOVENOX) injection 30 mg Start: 08/17/23 2200 SCDs Start: 08/17/23 2016  Diet:     Diet   Diet full liquid Fluid consistency: Thin   Consultants: None   Subjective 08/18/23    Pt reports "hunger pains" and denies any more episodes of diarrhea  since presenting. Denies pain with abdominal palpation. No nausea. Mouth is dry and continued throat pain   Objective   Vitals:   08/18/23 1200 08/18/23 1300 08/18/23 1400 08/18/23 1402  BP: (!) 121/46 (!) 115/54 (!) 107/37   Pulse: 74 77 69   Resp: 12 20 12    Temp:    98.7 F (37.1 C)  TempSrc:    Oral  SpO2: 96% 96% 96%   Weight:      Height:        Intake/Output Summary (Last 24 hours) at 08/18/2023 1536 Last data filed at 08/18/2023 0859 Gross per 24 hour  Intake --  Output  700 ml  Net -700 ml   Filed Weights   08/17/23 2032  Weight: 57.6 kg     Physical Exam:  General: awake, alert, NAD HEENT: atraumatic, clear conjunctiva, anicteric sclera, dry mucous membranes, hearing grossly normal Respiratory: normal respiratory effort. Cardiovascular: quick capillary refill, normal S1/S2, RRR, no JVD, murmurs Gastrointestinal: soft, NT, ND Nervous: A&O x3. no gross focal neurologic deficits, normal speech Extremities: moves all equally, no edema, normal tone Skin: dry, intact, normal temperature, normal color. No rashes,  lesions or ulcers on exposed skin Psychiatry: normal mood, congruent affect  Labs   I have personally reviewed the following labs and imaging studies CBC    Component Value Date/Time   WBC 12.2 (H) 08/18/2023 0500   RBC 2.87 (L) 08/18/2023 0500   HGB 9.0 (L) 08/18/2023 0500   HGB 11.1 01/11/2023 1446   HCT 26.3 (L) 08/18/2023 0500   HCT 33.4 (L) 01/11/2023 1446   PLT 200 08/18/2023 0500   PLT 361 01/11/2023 1446   MCV 91.6 08/18/2023 0500   MCV 92 01/11/2023 1446   MCH 31.4 08/18/2023 0500   MCHC 34.2 08/18/2023 0500   RDW 12.2 08/18/2023 0500   RDW 13.1 01/11/2023 1446   LYMPHSABS 0.6 (L) 08/17/2023 1538   LYMPHSABS 2.4 01/11/2023 1446   MONOABS 1.1 (H) 08/17/2023 1538   EOSABS 0.2 08/17/2023 1538   EOSABS 0.3 01/11/2023 1446   BASOSABS 0.0 08/17/2023 1538   BASOSABS 0.1 01/11/2023 1446      Latest Ref Rng & Units 08/18/2023    5:00 AM 08/17/2023    3:38 PM 01/30/2023    4:14 PM  BMP  Glucose 70 - 99 mg/dL 94  161  096   BUN 8 - 23 mg/dL 33  41  30   Creatinine 0.44 - 1.00 mg/dL 0.45  4.09  8.11   Sodium 135 - 145 mmol/L 133  127  136   Potassium 3.5 - 5.1 mmol/L 3.7  4.3  5.0   Chloride 98 - 111 mmol/L 108  98  105   CO2 22 - 32 mmol/L 18  20  21    Calcium 8.9 - 10.3 mg/dL 7.0  8.1  8.8     CT SOFT TISSUE NECK WO CONTRAST Result Date: 08/17/2023 CLINICAL DATA:  Exclude Large Pharyngeal Collection/Abscess (Cr cannot give contrast) EXAM: CT NECK WITHOUT CONTRAST TECHNIQUE: Multidetector CT imaging of the neck was performed following the standard protocol without intravenous contrast. RADIATION DOSE REDUCTION: This exam was performed according to the departmental dose-optimization program which includes automated exposure control, adjustment of the mA and/or kV according to patient size and/or use of iterative reconstruction technique. COMPARISON:  None Available. FINDINGS: Pharynx and larynx: Normal. No mass or swelling. Salivary glands: No inflammation, mass, or stone.  Thyroid: Normal. Lymph nodes: None enlarged or abnormal density. Vascular: Nondiagnostic given absence of contrast. Limited intracranial: Negative. Visualized orbits: Negative. Mastoids and visualized paranasal sinuses: Clear. Skeleton: No acute abnormality but limited assessment. Upper chest: Visualized lung apices are clear. IMPRESSION: No evidence of acute abnormality.  No visible fluid collection. Electronically Signed   By: Feliberto Harts M.D.   On: 08/17/2023 20:58   US Abdomen Limited RUQ (LIVER/GB) Result Date: 08/17/2023 CLINICAL DATA:  Transaminitis EXAM: ULTRASOUND ABDOMEN LIMITED RIGHT UPPER QUADRANT COMPARISON:  None Available. FINDINGS: Gallbladder: Cholecystectomy.  No sonographic Murphy sign noted by sonographer. Common bile duct: Diameter: 7 mm.  No intrahepatic biliary dilation. Liver: No focal lesion identified. Increased parenchymal echogenicity. Portal vein  is patent on color Doppler imaging with normal direction of blood flow towards the liver. Other: None. IMPRESSION: Hepatic steatosis.  No biliary dilation. Electronically Signed   By: Minerva Fester M.D.   On: 08/17/2023 20:51   DG Chest 2 View Result Date: 08/17/2023 CLINICAL DATA:  Fever. EXAM: CHEST - 2 VIEW COMPARISON:  December 10, 2022. FINDINGS: The heart size and mediastinal contours are within normal limits. Both lungs are clear. The visualized skeletal structures are unremarkable. IMPRESSION: No active cardiopulmonary disease. Electronically Signed   By: Lupita Raider M.D.   On: 08/17/2023 16:41    Disposition Plan & Communication  Patient status: Inpatient  Admitted From: Home Planned disposition location: Skilled nursing facility Anticipated discharge date: TBD pending clinical improvement  Family Communication: son at bedside    Author: Leeroy Bock, DO [facility] 08/18/2023, 3:36 PM   Available by Epic secure chat 7AM-7PM. If 7PM-7AM, please contact night-coverage.  [facility] contact information  found on [email or web address].

## 2023-08-19 ENCOUNTER — Encounter: Payer: Self-pay | Admitting: Internal Medicine

## 2023-08-19 DIAGNOSIS — A0472 Enterocolitis due to Clostridium difficile, not specified as recurrent: Secondary | ICD-10-CM

## 2023-08-19 DIAGNOSIS — J020 Streptococcal pharyngitis: Secondary | ICD-10-CM | POA: Diagnosis not present

## 2023-08-19 LAB — COMPREHENSIVE METABOLIC PANEL WITH GFR
ALT: 59 U/L — ABNORMAL HIGH (ref 0–44)
AST: 53 U/L — ABNORMAL HIGH (ref 15–41)
Albumin: 2.7 g/dL — ABNORMAL LOW (ref 3.5–5.0)
Alkaline Phosphatase: 116 U/L (ref 38–126)
Anion gap: 10 (ref 5–15)
BUN: 29 mg/dL — ABNORMAL HIGH (ref 8–23)
CO2: 19 mmol/L — ABNORMAL LOW (ref 22–32)
Calcium: 8.2 mg/dL — ABNORMAL LOW (ref 8.9–10.3)
Chloride: 101 mmol/L (ref 98–111)
Creatinine, Ser: 1.47 mg/dL — ABNORMAL HIGH (ref 0.44–1.00)
GFR, Estimated: 36 mL/min — ABNORMAL LOW (ref >60)
Glucose, Bld: 108 mg/dL — ABNORMAL HIGH (ref 70–99)
Potassium: 4 mmol/L (ref 3.5–5.1)
Sodium: 130 mmol/L — ABNORMAL LOW (ref 135–145)
Total Bilirubin: 0.6 mg/dL (ref 0.0–1.2)
Total Protein: 6.6 g/dL (ref 6.5–8.1)

## 2023-08-19 LAB — CBC
HCT: 29.4 % — ABNORMAL LOW (ref 36.0–46.0)
Hemoglobin: 10.5 g/dL — ABNORMAL LOW (ref 12.0–15.0)
MCH: 31.4 pg (ref 26.0–34.0)
MCHC: 35.7 g/dL (ref 30.0–36.0)
MCV: 88 fL (ref 80.0–100.0)
Platelets: 247 10*3/uL (ref 150–400)
RBC: 3.34 MIL/uL — ABNORMAL LOW (ref 3.87–5.11)
RDW: 12.1 % (ref 11.5–15.5)
WBC: 11.1 10*3/uL — ABNORMAL HIGH (ref 4.0–10.5)
nRBC: 0 % (ref 0.0–0.2)

## 2023-08-19 MED ORDER — VANCOMYCIN HCL 125 MG PO CAPS
125.0000 mg | ORAL_CAPSULE | Freq: Four times a day (QID) | ORAL | Status: DC
  Administered 2023-08-19 – 2023-08-26 (×31): 125 mg via ORAL
  Filled 2023-08-19 (×33): qty 1

## 2023-08-19 NOTE — Progress Notes (Signed)
 PROGRESS NOTE  Crystal Miller    DOB: Jun 11, 1945, 78 y.o.  ZOX:[id number]    Code Status: Limited: Do not attempt resuscitation (DNR) -DNR-LIMITED -Do Not Intubate/DNI    DOA: 08/17/2023   LOS: 2   Brief hospital course  Crystal Miller is a 78 y.o. adult with a PMH significant for cerebrovascular accident hypertension gastroesophageal reflux disease close recent diagnosis of group A streptococcal pharyngitis in the outpatient setting 3 days ago presents to the emergency department with worsening fever chills malaise anorexia and diarrhea.  She was treated with amoxicillin outpatient.  Group A strep testing negative upon admission.  Had poor PO intake due to odynophagia.   08/19/23 -GI studies positive for EPEC and Cdiff  Assessment & Plan  Principal Problem:   Sepsis (HCC) Active Problems:   Pharyngitis   Hyponatremia   AKI (acute kidney injury) (HCC)   C. difficile colitis   Essential hypertension   Mood disorder (HCC)   CVA (cerebral vascular accident) (HCC)   GERD (gastroesophageal reflux disease)   Hypocalcemia   Transaminitis   Elevated troponin  Sepsis- Tmax 103. WBC 15.3 along with AKI on CKD.  Blood cultures NGTD.  C. difficile colitis, EPEC - starting vancomycin PO x10 days - tylenol for fever   Group A strep pharyngitis- neg PCR on admission. Unsure if completed amoxicillin. Still symptomatic and febrile. No abnormalities on CT neck   Hyponatremia- 127>133>130. Improving with PO intake and resolution of diarrhea. S/p IV fluid   AKI- closer to baseline. Cr 1.4>1.47 today     Elevated troponin No chest pain likely secondary to the patient's CKD troponin was 24 -> 19 ECG is normal sinus rhythm severely reviewed and interpreted with ventricular rate of 82 no ST changes QTc 422   Transaminitis- neg acute hepatitis panel. Fatty liver on [place]    Hypocalcemia Corrected calcium 8.6 with albumin of 2.9 and calcium of 8.1 providing supplemental calcium   GERD - Continue  the patient's PPI    CVA Continue the patient's home aspirin and atorvastatin 40 mg   Mood disorder (HCC) Continue sertraline 25 mg daily Currently blunted mood  No Homicidal or Suicidal Ideation   Essential hypertension Holding the patient's home amlodipine and Coreg given her acute illness and risk for ATN  Body mass index is 24.8 kg/m.  VTE ppx: enoxaparin (LOVENOX) injection 30 mg Start: 08/17/23 2200 SCDs Start: 08/17/23 2016  Diet:     Diet   Diet full liquid Fluid consistency: Thin   Consultants: None   Subjective 08/19/23    Pt reports no more abdominal pain. Had multiple diarrhea episodes since yesterday   Objective   Vitals:   08/19/23 0500 08/19/23 0536 08/19/23 0540 08/19/23 0600  BP: (!) 147/48   (!) 138/46  Pulse: 82   80  Resp: 16   17  Temp:  100 F (37.8 C)    TempSrc:  Oral    SpO2: (!) 89%  95% 95%  Weight:      Height:        Intake/Output Summary (Last 24 hours) at 08/19/2023 0804 Last data filed at 08/19/2023 8119 Gross per 24 hour  Intake 100 ml  Output 700 ml  Net -600 ml   Filed Weights   08/17/23 2032  Weight: 57.6 kg     Physical Exam:  General: awake, alert, NAD HEENT: atraumatic, clear conjunctiva, anicteric sclera, dry mucous membranes, hearing grossly normal Respiratory: normal respiratory effort. Cardiovascular: quick capillary refill, normal S1/S2, RRR,  no JVD, murmurs Gastrointestinal: soft, NT, ND Nervous: A&O x3. no gross focal neurologic deficits, normal speech Extremities: moves all equally, no edema, normal tone Skin: dry, intact, normal temperature, normal color. No rashes, lesions or ulcers on exposed skin Psychiatry: normal mood, congruent affect  Labs   I have personally reviewed the following labs and imaging studies CBC    Component Value Date/Time   WBC 11.1 (H) 08/19/2023 0418   RBC 3.34 (L) 08/19/2023 0418   HGB 10.5 (L) 08/19/2023 0418   HGB 11.1 01/11/2023 1446   HCT 29.4 (L) 08/19/2023 0418    HCT 33.4 (L) 01/11/2023 1446   PLT 247 08/19/2023 0418   PLT 361 01/11/2023 1446   MCV 88.0 08/19/2023 0418   MCV 92 01/11/2023 1446   MCH 31.4 08/19/2023 0418   MCHC 35.7 08/19/2023 0418   RDW 12.1 08/19/2023 0418   RDW 13.1 01/11/2023 1446   LYMPHSABS 0.6 (L) 08/17/2023 1538   LYMPHSABS 2.4 01/11/2023 1446   MONOABS 1.1 (H) 08/17/2023 1538   EOSABS 0.2 08/17/2023 1538   EOSABS 0.3 01/11/2023 1446   BASOSABS 0.0 08/17/2023 1538   BASOSABS 0.1 01/11/2023 1446      Latest Ref Rng & Units 08/19/2023    4:18 AM 08/18/2023    5:00 AM 08/17/2023    3:38 PM  BMP  Glucose 70 - 99 mg/dL 841  94  324   BUN 8 - 23 mg/dL 29  33  41   Creatinine 0.44 - 1.00 mg/dL 4.01  0.27  2.53   Sodium 135 - 145 mmol/L 130  133  127   Potassium 3.5 - 5.1 mmol/L 4.0  3.7  4.3   Chloride 98 - 111 mmol/L 101  108  98   CO2 22 - 32 mmol/L 19  18  20    Calcium 8.9 - 10.3 mg/dL 8.2  7.0  8.1     CT SOFT TISSUE NECK WO CONTRAST Result Date: 08/17/2023 CLINICAL DATA:  Exclude Large Pharyngeal Collection/Abscess (Cr cannot give contrast) EXAM: CT NECK WITHOUT CONTRAST TECHNIQUE: Multidetector CT imaging of the neck was performed following the standard protocol without intravenous contrast. RADIATION DOSE REDUCTION: This exam was performed according to the departmental dose-optimization program which includes automated exposure control, adjustment of the mA and/or kV according to patient size and/or use of iterative reconstruction technique. COMPARISON:  None Available. FINDINGS: Pharynx and larynx: Normal. No mass or swelling. Salivary glands: No inflammation, mass, or stone. Thyroid: Normal. Lymph nodes: None enlarged or abnormal density. Vascular: Nondiagnostic given absence of contrast. Limited intracranial: Negative. Visualized orbits: Negative. Mastoids and visualized paranasal sinuses: Clear. Skeleton: No acute abnormality but limited assessment. Upper chest: Visualized lung apices are clear. IMPRESSION: No  evidence of acute abnormality.  No visible fluid collection. Electronically Signed   By: Feliberto Harts M.D.   On: 08/17/2023 20:58   US Abdomen Limited RUQ (LIVER/GB) Result Date: 08/17/2023 CLINICAL DATA:  Transaminitis EXAM: ULTRASOUND ABDOMEN LIMITED RIGHT UPPER QUADRANT COMPARISON:  None Available. FINDINGS: Gallbladder: Cholecystectomy.  No sonographic Murphy sign noted by sonographer. Common bile duct: Diameter: 7 mm.  No intrahepatic biliary dilation. Liver: No focal lesion identified. Increased parenchymal echogenicity. Portal vein is patent on color Doppler imaging with normal direction of blood flow towards the liver. Other: None. IMPRESSION: Hepatic steatosis.  No biliary dilation. Electronically Signed   By: Minerva Fester M.D.   On: 08/17/2023 20:51   DG Chest 2 View Result Date: 08/17/2023 CLINICAL DATA:  Fever. EXAM:  CHEST - 2 VIEW COMPARISON:  December 10, 2022. FINDINGS: The heart size and mediastinal contours are within normal limits. Both lungs are clear. The visualized skeletal structures are unremarkable. IMPRESSION: No active cardiopulmonary disease. Electronically Signed   By: Lupita Raider M.D.   On: 08/17/2023 16:41    Disposition Plan & Communication  Patient status: Inpatient  Admitted From: Home Planned disposition location: Skilled nursing facility Anticipated discharge date: TBD pending clinical improvement  Family Communication: daughter on phone   Author: Leeroy Bock, DO [facility] 08/19/2023, 8:04 AM   Available by Epic secure chat 7AM-7PM. If 7PM-7AM, please contact night-coverage.  [facility] contact information found on [email or web address].

## 2023-08-19 NOTE — Progress Notes (Signed)
 Mobility Specialist - Progress Note     08/19/23 1633  Mobility  Activity Ambulated with assistance in room  Level of Assistance Contact guard assist, steadying assist  Assistive Device Front wheel walker  Distance Ambulated (ft) 20 ft  Range of Motion/Exercises Active  Activity Response Tolerated well  Mobility Referral Yes  Mobility visit 1 Mobility  Mobility Specialist Start Time (ACUTE ONLY) 1610  Mobility Specialist Stop Time (ACUTE ONLY) 1627  Mobility Specialist Time Calculation (min) (ACUTE ONLY) 17 min   Pt resting in bed on RA upon entry. Pt STS and ambulates to door in room CGA with RW. Pt returned to recliner and left with needs in reach. Pt gait is moderately stable; slow. Pt chair alarm activated.   Johnathan Hausen Mobility Specialist 08/19/23, 4:38 PM

## 2023-08-20 ENCOUNTER — Telehealth (HOSPITAL_COMMUNITY): Payer: Self-pay | Admitting: Pharmacy Technician

## 2023-08-20 ENCOUNTER — Other Ambulatory Visit (HOSPITAL_COMMUNITY): Payer: Self-pay

## 2023-08-20 DIAGNOSIS — A0472 Enterocolitis due to Clostridium difficile, not specified as recurrent: Secondary | ICD-10-CM | POA: Diagnosis not present

## 2023-08-20 DIAGNOSIS — J020 Streptococcal pharyngitis: Secondary | ICD-10-CM | POA: Diagnosis not present

## 2023-08-20 LAB — CBC
HCT: 30.8 % — ABNORMAL LOW (ref 36.0–46.0)
Hemoglobin: 10.9 g/dL — ABNORMAL LOW (ref 12.0–15.0)
MCH: 31.6 pg (ref 26.0–34.0)
MCHC: 35.4 g/dL (ref 30.0–36.0)
MCV: 89.3 fL (ref 80.0–100.0)
Platelets: 254 10*3/uL (ref 150–400)
RBC: 3.45 MIL/uL — ABNORMAL LOW (ref 3.87–5.11)
RDW: 12.2 % (ref 11.5–15.5)
WBC: 11.4 10*3/uL — ABNORMAL HIGH (ref 4.0–10.5)
nRBC: 0 % (ref 0.0–0.2)

## 2023-08-20 LAB — BASIC METABOLIC PANEL WITH GFR
Anion gap: 10 (ref 5–15)
BUN: 22 mg/dL (ref 8–23)
CO2: 20 mmol/L — ABNORMAL LOW (ref 22–32)
Calcium: 8.3 mg/dL — ABNORMAL LOW (ref 8.9–10.3)
Chloride: 102 mmol/L (ref 98–111)
Creatinine, Ser: 1.16 mg/dL — ABNORMAL HIGH (ref 0.44–1.00)
GFR, Estimated: 48 mL/min — ABNORMAL LOW (ref >60)
Glucose, Bld: 129 mg/dL — ABNORMAL HIGH (ref 70–99)
Potassium: 4.1 mmol/L (ref 3.5–5.1)
Sodium: 132 mmol/L — ABNORMAL LOW (ref 135–145)

## 2023-08-20 MED ORDER — AMLODIPINE BESYLATE 10 MG PO TABS
10.0000 mg | ORAL_TABLET | Freq: Every day | ORAL | Status: DC
  Administered 2023-08-20 – 2023-08-26 (×7): 10 mg via ORAL
  Filled 2023-08-20 (×7): qty 1

## 2023-08-20 MED ORDER — AZITHROMYCIN 500 MG PO TABS
500.0000 mg | ORAL_TABLET | Freq: Every day | ORAL | Status: AC
  Administered 2023-08-20 – 2023-08-22 (×3): 500 mg via ORAL
  Filled 2023-08-20 (×3): qty 1

## 2023-08-20 MED ORDER — CARVEDILOL 6.25 MG PO TABS
6.2500 mg | ORAL_TABLET | Freq: Two times a day (BID) | ORAL | Status: DC
  Administered 2023-08-20 – 2023-08-26 (×13): 6.25 mg via ORAL
  Filled 2023-08-20 (×13): qty 1

## 2023-08-20 MED ORDER — ENSURE ENLIVE PO LIQD
237.0000 mL | Freq: Two times a day (BID) | ORAL | Status: DC
  Administered 2023-08-20 – 2023-08-26 (×2): 237 mL via ORAL

## 2023-08-20 NOTE — Progress Notes (Signed)
 Mobility Specialist - Progress Note     08/20/23 1144  Mobility  Activity Transferred to/from BSC;Dangled on edge of bed;Ambulated with assistance in room  Level of Assistance Contact guard assist, steadying assist  Assistive Device Front wheel walker  Distance Ambulated (ft) 8 ft  Range of Motion/Exercises Active  Activity Response Tolerated well  Mobility Referral Yes  Mobility visit 1 Mobility  Mobility Specialist Stop Time (ACUTE ONLY) 1146   Pt resting on BSC upon entry on RA. Pt STS and ambulates to end of bed and side steps the length of the bed to the head CGA with RW. Pt returned to bed and left with needs in reach.   Johnathan Hausen Mobility Specialist 08/20/23, 11:48 AM

## 2023-08-20 NOTE — Telephone Encounter (Signed)
 Patient Product/process development scientist completed.    The patient is insured through [employer]. Patient has Medicare and is not eligible for a copay card, but may be able to apply for patient assistance or Medicare RX Payment Plan (Patient Must reach out to their plan, if eligible for payment plan), if available.    Ran test claim for vancomyin 125 mg and the current 10 day co-pay is $4.90.   This test claim was processed through [facility]- copay amounts may vary at other pharmacies due to pharmacy/plan contracts, or as the patient moves through the different stages of their insurance plan.     Roland Earl, CPHT Pharmacy Technician III Certified Patient Advocate [facility] Patient Advocate Team Direct Number: [phone]  Fax: [phone]

## 2023-08-20 NOTE — Plan of Care (Signed)

## 2023-08-20 NOTE — Progress Notes (Addendum)
 Physical Therapy Treatment Patient Details Name: Crystal Miller MRN: [id number] DOB: 1945-08-18 Today's Date: 08/20/2023   History of Present Illness 78 year old female presents with worsening fever, chills, malaise, anorexia and diarrhea. Admitted for management of sepsis, pharyngitis, hyponatremia and AKI. PMH of CVA, hypertension, GERD, close recent diagnosis of group A streptococcal pharyngitis in the outpatient setting 3 days ago.    PT Comments  Pt ready for session.  To EOB with min a x 1 and rails.  Difficulty getting hips fully to EOB on her own.  Steady in sitting.  Stands with min a x 1 to walker and is able to walk to door and back twice with seated rest on BSC but no void/BM noted.  Remained in chair with needs met and safety on.  Fatigued with activity.  Stated she remains far from baseline as she was able to do ADL tasks and cooking etc on her own prior to admission.  Will  benefit from < 3 hrs a day therapy at [facility].  Is highly motivated to participate.   If plan is discharge home, recommend the following: A little help with walking and/or transfers;A little help with bathing/dressing/bathroom;Assistance with cooking/housework;Help with stairs or ramp for entrance;Assist for transportation   Can travel by private vehicle        Equipment Recommendations  None recommended by PT    Recommendations for Other Services       Precautions / Restrictions Precautions Precautions: Fall Recall of Precautions/Restrictions: Intact Restrictions Weight Bearing Restrictions Per Provider Order: No     Mobility  Bed Mobility Overal bed mobility: Needs Assistance Bed Mobility: Supine to Sit     Supine to sit: Min assist, Used rails, HOB elevated       Patient Response: Cooperative  Transfers Overall transfer level: Needs assistance Equipment used: Rolling walker (2 wheels) Transfers: Sit to/from Stand Sit to Stand: Min assist                 Ambulation/Gait Ambulation/Gait assistance: Editor, commissioning (Feet): 20 Feet Assistive device: Rolling walker (2 wheels) Gait Pattern/deviations: Step-through pattern, Decreased step length - right, Decreased step length - left, Trunk flexed Gait velocity: dec     General Gait Details: to door and back x 2 with seated rest between   Stairs             Wheelchair Mobility     Tilt Bed Tilt Bed Patient Response: Cooperative  Modified Rankin (Stroke Patients Only)       Balance Overall balance assessment: Needs assistance Sitting-balance support: Feet supported Sitting balance-Leahy Scale: Fair     Standing balance support: Reliant on assistive device for balance, Bilateral upper extremity supported Standing balance-Leahy Scale: Poor                              Communication Communication Communication: No apparent difficulties  Cognition Arousal: Alert Behavior During Therapy: WFL for tasks assessed/performed   PT - Cognitive impairments: No apparent impairments                         Following commands: Intact      Cueing Cueing Techniques: Verbal cues  Exercises      General Comments        Pertinent Vitals/Pain Pain Assessment Pain Assessment: No/denies pain    Home Living  Prior Function            PT Goals (current goals can now be found in the care plan section) Progress towards PT goals: Progressing toward goals    Frequency    Min 2X/week      PT Plan      Co-evaluation              AM-PAC PT "6 Clicks" Mobility   Outcome Measure  Help needed turning from your back to your side while in a flat bed without using bedrails?: A Little Help needed moving from lying on your back to sitting on the side of a flat bed without using bedrails?: A Little Help needed moving to and from a bed to a chair (including a wheelchair)?: A Little Help needed standing  up from a chair using your arms (e.g., wheelchair or bedside chair)?: A Little Help needed to walk in hospital room?: A Little Help needed climbing 3-5 steps with a railing? : Total 6 Click Score: 16    End of Session Equipment Utilized During Treatment: Gait belt Activity Tolerance: Patient tolerated treatment well Patient left: in chair;with chair alarm set;with call bell/phone within reach Nurse Communication: Mobility status PT Visit Diagnosis: Unsteadiness on feet (R26.81);History of falling (Z91.81);Muscle weakness (generalized) (M62.81);Difficulty in walking, not elsewhere classified (R26.2)     Time: 7829-5621 PT Time Calculation (min) (ACUTE ONLY): 21 min  Charges:    $Gait Training: 8-22 mins PT General Charges $$ ACUTE PT VISIT: 1 Visit                   Danielle Dess, PTA 08/20/23, 9:26 AM

## 2023-08-20 NOTE — Progress Notes (Signed)
 PROGRESS NOTE  Crystal Miller    DOB: 05/09/1946, 78 y.o.  WUJ:[id number]    Code Status: Limited: Do not attempt resuscitation (DNR) -DNR-LIMITED -Do Not Intubate/DNI    DOA: 08/17/2023   LOS: 3   Brief hospital course  Crystal Miller is a 78 y.o. adult with a PMH significant for cerebrovascular accident hypertension gastroesophageal reflux disease close recent diagnosis of group A streptococcal pharyngitis in the outpatient setting 3 days ago presents to the emergency department with worsening fever chills malaise anorexia and diarrhea.  She was treated with amoxicillin outpatient.  Group A strep testing negative upon admission.  Had poor PO intake due to odynophagia.   08/20/23 -GI studies positive for EPEC and Cdiff  Assessment & Plan  Principal Problem:   Sepsis (HCC) Active Problems:   Pharyngitis   Hyponatremia   AKI (acute kidney injury) (HCC)   C. difficile colitis   Essential hypertension   Mood disorder (HCC)   CVA (cerebral vascular accident) (HCC)   GERD (gastroesophageal reflux disease)   Hypocalcemia   Transaminitis   Elevated troponin   Clostridium difficile colitis  Sepsis- Blood cultures NGTD. Sepsis criteria have resolved. She is doing really well with continued loose BMs.  C. difficile colitis, EPEC - starting vancomycin PO x10 days - adding azithromycin, still febrile today - tylenol for fever - PT/OT evaluated and recommending SNF. TOC engaged   Group A strep pharyngitis- neg PCR on admission. Unsure if completed amoxicillin.No abnormalities on CT neck - supportive care PRN   Hyponatremia- 127>>>132. Improving with PO intake and resolution of diarrhea. S/p IV fluid   AKI- closer to baseline. Cr 1.4>1.47>1.16 today     Elevated troponin No chest pain likely secondary to the patient's CKD troponin was 24 -> 19 ECG is normal sinus rhythm severely reviewed and interpreted with ventricular rate of 82 no ST changes QTc 422   Transaminitis- neg acute  hepatitis panel. Fatty liver on [place]    Hypocalcemia Corrected calcium 8.6 with albumin of 2.9 and calcium of 8.1 providing supplemental calcium   GERD - Continue the patient's PPI    CVA Continue the patient's home aspirin and atorvastatin 40 mg   Mood disorder (HCC) Continue sertraline 25 mg daily   Essential hypertension - restating patient's home amlodipine and Coreg given improvement and having hypertension returning   Body mass index is 24.8 kg/m.  VTE ppx: enoxaparin (LOVENOX) injection 30 mg Start: 08/17/23 2200 SCDs Start: 08/17/23 2016  Diet:     Diet   Diet regular Room service appropriate? Yes; Fluid consistency: Thin   Consultants: None   Subjective 08/20/23    Pt reports mild persistent abdominal discomfort in lower quadrants. Not improved with BMs. Denies nausea. Overall feels well.    Objective   Vitals:   08/19/23 1313 08/19/23 1528 08/19/23 2013 08/20/23 0516  BP: (!) 149/75 (!) 142/46 130/66 (!) 165/57  Pulse: 68 67 83 72  Resp: 20 19  18   Temp: 98 F (36.7 C) 98.1 F (36.7 C) 99.5 F (37.5 C) 98.8 F (37.1 C)  TempSrc: Oral Oral Oral Oral  SpO2: 95% 96% 98% 98%  Weight:      Height:       No intake or output data in the 24 hours ending 08/20/23 0724  Filed Weights   08/17/23 2032  Weight: 57.6 kg     Physical Exam:  General: awake, alert, NAD HEENT: atraumatic, clear conjunctiva, anicteric sclera, dry mucous membranes, hearing grossly normal Respiratory:  normal respiratory effort. Cardiovascular: quick capillary refill, normal S1/S2, RRR, no JVD, murmurs Gastrointestinal: soft, NT to palpation, ND Nervous: A&O x3. no gross focal neurologic deficits, normal speech Extremities: moves all equally, no edema, normal tone Skin: dry, intact, normal temperature, normal color. No rashes, lesions or ulcers on exposed skin Psychiatry: normal mood, congruent affect  Labs   I have personally reviewed the following labs and imaging  studies CBC    Component Value Date/Time   WBC 11.4 (H) 08/20/2023 0546   RBC 3.45 (L) 08/20/2023 0546   HGB 10.9 (L) 08/20/2023 0546   HGB 11.1 01/11/2023 1446   HCT 30.8 (L) 08/20/2023 0546   HCT 33.4 (L) 01/11/2023 1446   PLT 254 08/20/2023 0546   PLT 361 01/11/2023 1446   MCV 89.3 08/20/2023 0546   MCV 92 01/11/2023 1446   MCH 31.6 08/20/2023 0546   MCHC 35.4 08/20/2023 0546   RDW 12.2 08/20/2023 0546   RDW 13.1 01/11/2023 1446   LYMPHSABS 0.6 (L) 08/17/2023 1538   LYMPHSABS 2.4 01/11/2023 1446   MONOABS 1.1 (H) 08/17/2023 1538   EOSABS 0.2 08/17/2023 1538   EOSABS 0.3 01/11/2023 1446   BASOSABS 0.0 08/17/2023 1538   BASOSABS 0.1 01/11/2023 1446      Latest Ref Rng & Units 08/20/2023    5:46 AM 08/19/2023    4:18 AM 08/18/2023    5:00 AM  BMP  Glucose 70 - 99 mg/dL 409  811  94   BUN 8 - 23 mg/dL 22  29  33   Creatinine 0.44 - 1.00 mg/dL 9.14  7.82  9.56   Sodium 135 - 145 mmol/L 132  130  133   Potassium 3.5 - 5.1 mmol/L 4.1  4.0  3.7   Chloride 98 - 111 mmol/L 102  101  108   CO2 22 - 32 mmol/L 20  19  18    Calcium 8.9 - 10.3 mg/dL 8.3  8.2  7.0     No results found.   Disposition Plan & Communication  Patient status: Inpatient  Admitted From: Home Planned disposition location: Skilled nursing facility Anticipated discharge date: TBD pending clinical improvement  Family Communication: daughter on phone   Author: Leeroy Bock, DO [facility] 08/20/2023, 7:24 AM   Available by Epic secure chat 7AM-7PM. If 7PM-7AM, please contact night-coverage.  [facility] contact information found on [email or web address].

## 2023-08-20 NOTE — Care Management Important Message (Signed)
 Important Message  Patient Details  Name: Crystal Miller MRN: [id number] Date of Birth: [birth year]   Important Message Given:  Yes - Medicare IM     Rachel Samples W, CMA 08/20/2023, 11:35 AM

## 2023-08-21 DIAGNOSIS — I10 Essential (primary) hypertension: Secondary | ICD-10-CM

## 2023-08-21 DIAGNOSIS — E871 Hypo-osmolality and hyponatremia: Secondary | ICD-10-CM | POA: Diagnosis not present

## 2023-08-21 DIAGNOSIS — A0472 Enterocolitis due to Clostridium difficile, not specified as recurrent: Secondary | ICD-10-CM | POA: Diagnosis not present

## 2023-08-21 DIAGNOSIS — N179 Acute kidney failure, unspecified: Secondary | ICD-10-CM

## 2023-08-21 NOTE — [place] FL2 (Signed)
 [place] MEDICAID FL2 LEVEL OF CARE FORM     IDENTIFICATION  Patient Name: Crystal Miller Birthdate: 1945-07-31 Sex: female Admission Date (Current Location): 08/17/2023  [facility] and [place] Number:  Chiropodist and Address:  [facility], [address] [place], [place] [address]      Provider Number: [id number]  Attending Physician Name and Address:  Pennie Banter, DO  Relative Name and Phone Number:       Current Level of Care: Hospital Recommended Level of Care: Skilled Nursing Facility Prior Approval Number:    Date Approved/Denied:   PASRR Number: [id number]  Discharge Plan: SNF    Current Diagnoses: Patient Active Problem List   Diagnosis Date Noted   Clostridium difficile colitis 08/19/2023   Sepsis (HCC) 08/17/2023   Pharyngitis 08/17/2023   Hyponatremia 08/17/2023   AKI (acute kidney injury) (HCC) 08/17/2023   Hypocalcemia 08/17/2023   C. difficile colitis 08/17/2023   Transaminitis 08/17/2023   Elevated troponin 08/17/2023   Cerumen impaction 02/01/2023   Atherosclerosis of native arteries of extremity with intermittent claudication (HCC) 03/12/2022   Bruising 01/17/2022   Foot laceration 07/25/2021   Onychomycosis 07/25/2021   Subclinical hypothyroidism 04/23/2020   Chronic kidney disease due to hypertension 12/02/2019   Localized edema 12/02/2019   GERD (gastroesophageal reflux disease) 08/19/2019   Diabetic retinopathy (HCC) 12/04/2018   Overactive bladder 06/30/2018   Mood disorder (HCC) 02/19/2018   PAD (peripheral artery disease) (HCC) 10/10/2017   CVA (cerebral vascular accident) (HCC) 03/27/2017   DM (diabetes mellitus), 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   Essential hypertension 11/01/2016   Mixed hyperlipidemia 11/01/2016   Memory change 11/01/2016   History of stroke 10/01/2016    Orientation RESPIRATION BLADDER Height & Weight     Self, Time, Situation, Place  Normal Continent  Weight: 127 lb (57.6 kg) Height:  5' (152.4 cm)  BEHAVIORAL SYMPTOMS/MOOD NEUROLOGICAL BOWEL NUTRITION STATUS   (None)  (None) Continent Diet (Regular)  AMBULATORY STATUS COMMUNICATION OF NEEDS Skin   Limited Assist Verbally Bruising, Other (Comment) (Erythema/redness. MASD on right and left perineum: Foam.)                       Personal Care Assistance Level of Assistance  Bathing, Dressing, Feeding Bathing Assistance: Maximum assistance Feeding assistance: Limited assistance Dressing Assistance: Maximum assistance     Functional Limitations Info  Sight, Hearing, Speech Sight Info: Adequate Hearing Info: Adequate Speech Info: Adequate    SPECIAL CARE FACTORS FREQUENCY  PT (By licensed PT), OT (By licensed OT)     PT Frequency: 5 x week OT Frequency: 5 x week            Contractures Contractures Info: Not present    Additional Factors Info  Code Status, Allergies, Isolation Precautions Code Status Info: DNR Allergies Info: Metformin and related     Isolation Precautions Info: Enteric.     Current Medications (08/21/2023):  This is the current hospital active medication list Current Facility-Administered Medications  Medication Dose Route Frequency Provider Last Rate Last Admin   acetaminophen (TYLENOL) tablet 650 mg  650 mg Oral Q6H PRN Leeroy Bock, MD   650 mg at 08/21/23 0521   amLODipine (NORVASC) tablet 10 mg  10 mg Oral Daily Leeroy Bock, MD   10 mg at 08/21/23 7846   aspirin EC tablet 81 mg  81 mg Oral Daily Core, Doy Hutching, MD   81 mg at 08/21/23 [phone]  atorvastatin (LIPITOR) tablet 40 mg  40 mg Oral Daily Core, Doy Hutching, MD   40 mg at 08/21/23 0833   azithromycin (ZITHROMAX) tablet 500 mg  500 mg Oral Daily Leeroy Bock, MD   500 mg at 08/21/23 1308   carvedilol (COREG) tablet 6.25 mg  6.25 mg Oral BID WC Jamelle Rushing L, MD   6.25 mg at 08/21/23 0833   enoxaparin (LOVENOX) injection 30 mg  30 mg Subcutaneous Q24H Core, Doy Hutching, MD   30 mg at 08/20/23 2201   feeding supplement (ENSURE ENLIVE / ENSURE PLUS) liquid 237 mL  237 mL Oral BID BM Leeroy Bock, MD   237 mL at 08/20/23 1715   nystatin (MYCOSTATIN/NYSTOP) topical powder   Topical BID Core, Doy Hutching, MD   Given at 08/21/23 0834   oxybutynin (DITROPAN-XL) 24 hr tablet 5 mg  5 mg Oral QHS Core, Doy Hutching, MD   5 mg at 08/20/23 2201   pantoprazole (PROTONIX) EC tablet 40 mg  40 mg Oral Daily Core, Doy Hutching, MD   40 mg at 08/21/23 0833   phenol (CHLORASEPTIC) mouth spray 1 spray  1 spray Mouth/Throat PRN Leeroy Bock, MD       sertraline (ZOLOFT) tablet 25 mg  25 mg Oral Daily Core, Doy Hutching, MD   25 mg at 08/21/23 6578   vancomycin (VANCOCIN) capsule 125 mg  125 mg Oral QID Leeroy Bock, MD   125 mg at 08/21/23 4696     Discharge Medications: Please see discharge summary for a list of discharge medications.  Relevant Imaging Results:  Relevant Lab Results:   Additional Information SS#: [id number]  Margarito Liner, LCSW

## 2023-08-21 NOTE — Progress Notes (Addendum)
 Physical Therapy Treatment Patient Details Name: Crystal Miller MRN: [id number] DOB: 1946-04-12 Today's Date: 08/21/2023   History of Present Illness 78 year old female presents with worsening fever, chills, malaise, anorexia and diarrhea. Admitted for management of sepsis, pharyngitis, hyponatremia and AKI. PMH of CVA, hypertension, GERD, close recent diagnosis of group A streptococcal pharyngitis in the outpatient setting 3 days ago.    PT Comments  Pt resting with family members upon arrival to the room, however agreeable to therapy.  Pt performed well with transfers and was able to ambulate to the door and back to the bed x4 attempts.  Pt did need to make a pitstop at the restroom due to having a bowel movement on the floor on the final attempt of ambulation.  Pt was assisted to the toilet and floor was sanitized with bleach wipes.  Pt was left with nursing in the room and pt voiding.      If plan is discharge home, recommend the following: A little help with walking and/or transfers;A little help with bathing/dressing/bathroom;Assistance with cooking/housework;Help with stairs or ramp for entrance;Assist for transportation   Can travel by private vehicle        Equipment Recommendations  None recommended by PT    Recommendations for Other Services       Precautions / Restrictions Precautions Precautions: Fall Recall of Precautions/Restrictions: Intact Restrictions Weight Bearing Restrictions Per Provider Order: No     Mobility  Bed Mobility Overal bed mobility: Needs Assistance Bed Mobility: Supine to Sit     Supine to sit: Used rails, HOB elevated, Supervision     General bed mobility comments: pt able to come upright without any complications    Transfers Overall transfer level: Needs assistance Equipment used: Rolling walker (2 wheels) Transfers: Sit to/from Stand Sit to Stand: Min assist           General transfer comment: minA to stand and then able to  ambulate with RW.    Ambulation/Gait Ambulation/Gait assistance: Min assist Gait Distance (Feet): 40 Feet Assistive device: Rolling walker (2 wheels) Gait Pattern/deviations: Step-through pattern, Decreased step length - right, Decreased step length - left, Trunk flexed Gait velocity: decreased     General Gait Details: to door and back x4 with seeated reat break halfway through and on final attempt was assisted to the toilet as she was having a bowel movement on the floor.   Stairs             Wheelchair Mobility     Tilt Bed    Modified Rankin (Stroke Patients Only)       Balance Overall balance assessment: Needs assistance Sitting-balance support: Feet supported Sitting balance-Leahy Scale: Fair     Standing balance support: Reliant on assistive device for balance, Bilateral upper extremity supported Standing balance-Leahy Scale: Fair                              Hotel manager: No apparent difficulties  Cognition Arousal: Alert Behavior During Therapy: WFL for tasks assessed/performed   PT - Cognitive impairments: No apparent impairments                         Following commands: Intact      Cueing Cueing Techniques: Verbal cues  Exercises      General Comments        Pertinent Vitals/Pain Pain Assessment Pain Assessment: No/denies pain  Home Living                          Prior Function            PT Goals (current goals can now be found in the care plan section) Acute Rehab PT Goals Patient Stated Goal: " I am OK to get Rehab so I can return to being like before." PT Goal Formulation: With patient Time For Goal Achievement: 09/01/23 Potential to Achieve Goals: Good Progress towards PT goals: Progressing toward goals    Frequency    Min 2X/week      PT Plan      Co-evaluation              AM-PAC PT "6 Clicks" Mobility   Outcome Measure  Help needed  turning from your back to your side while in a flat bed without using bedrails?: A Little Help needed moving from lying on your back to sitting on the side of a flat bed without using bedrails?: A Little Help needed moving to and from a bed to a chair (including a wheelchair)?: A Little Help needed standing up from a chair using your arms (e.g., wheelchair or bedside chair)?: A Little Help needed to walk in hospital room?: A Little Help needed climbing 3-5 steps with a railing? : A Lot 6 Click Score: 17    End of Session Equipment Utilized During Treatment: Gait belt Activity Tolerance: Patient tolerated treatment well Patient left: with nursing/sitter in room (on commode) Nurse Communication: Mobility status PT Visit Diagnosis: Unsteadiness on feet (R26.81);History of falling (Z91.81);Muscle weakness (generalized) (M62.81);Difficulty in walking, not elsewhere classified (R26.2)     Time: 1610-9604 PT Time Calculation (min) (ACUTE ONLY): 23 min  Charges:    $Therapeutic Activity: 23-37 mins PT General Charges $$ ACUTE PT VISIT: 1 Visit                     Nolon Bussing, PT, DPT Physical Therapist - [facility]  08/21/23, 5:47 PM

## 2023-08-21 NOTE — Progress Notes (Signed)
 Progress Note   Patient: Crystal Miller WUJ:[id number] DOB: January 11, 1946 DOA: 08/17/2023     4 DOS: the patient was seen and examined on 08/21/2023   Brief hospital course:  "Crystal Miller is a 78 y.o. adult with a PMH significant for cerebrovascular accident hypertension gastroesophageal reflux disease close recent diagnosis of group A streptococcal pharyngitis in the outpatient setting 3 days ago presents to the emergency department with worsening fever chills malaise anorexia and diarrhea.  She was treated with amoxicillin outpatient.  Group A strep testing negative upon admission.  Had poor PO intake due to odynophagia. "  Patient was admitted for further evaluation and management. Stool studies positive for C diff infection and EPEC.   Patient is being treated with oral vancomycin and azithromycin.   4/2 -- fevers with Tmax 101.2 F at [facility] today.     Assessment and Plan:  Sepsis- Blood cultures NGTD. Sepsis criteria have resolved.  C. difficile colitis, EPEC 4/2 -- ongoing fevers and diarrhea, poor PO intake. - continue vancomycin PO x 10 day course - continue azithromycin PO - tylenol for fever - PT/OT evaluated and recommending SNF. TOC engaged   Group A strep pharyngitis- neg PCR on admission. Unsure if completed amoxicillin.No abnormalities on CT neck - supportive care PRN   Hyponatremia- 127>>>132.  Improving with PO intake and resolution of diarrhea. S/p IV fluid --Monitor BMP   AKI- closer to baseline. Cr 1.40>>1.47>>1.16 today --Monitor BMP     Elevated troponin No chest pain likely secondary to the patient's CKD troponin was 24 -> 19 ECG is normal sinus rhythm severely reviewed and interpreted with ventricular rate of 82 no ST changes QTc 422   Transaminitis- neg acute hepatitis panel. Fatty liver on [place]    Hypocalcemia Corrected calcium 8.6 with albumin of 2.9 and calcium of 8.1 providing supplemental calcium   GERD - Continue the patient's PPI     CVA Continue the patient's home aspirin and atorvastatin 40 mg   Mood disorder (HCC) Continue sertraline 25 mg daily   Essential hypertension - restating patient's home amlodipine and Coreg given improvement and having hypertension returning         Subjective: Pt seen awake resting in bed this AM.  She reports feeling better than early this AM during fever.  Reports still having diarrhea and poor appetite. No other acute complaints.   Physical Exam: Vitals:   08/20/23 1637 08/20/23 1954 08/21/23 0502 08/21/23 0836  BP: (!) 138/55 (!) 132/96 (!) 150/48 (!) 129/44  Pulse: 63 70 81 67  Resp: 18   18  Temp: 98.3 F (36.8 C) (!) 97.5 F (36.4 C) (!) 101.2 F (38.4 C) 97.6 F (36.4 C)  TempSrc: Oral Oral Oral   SpO2: 96% 98% 94% 98%  Weight:      Height:       General exam: awake, alert, no acute distress HEENT: moist mucus membranes, hearing grossly normal  Respiratory system: CTAB, no wheezes, rales or rhonchi, normal respiratory effort. Cardiovascular system: normal S1/S2, RRR, no pedal edema.   Gastrointestinal system: soft, NT, ND, no HSM felt, +bowel sounds. Central nervous system: A&O x 3. no gross focal neurologic deficits, normal speech Extremities: moves all , no edema, normal tone Skin: dry, intact, normal temperature Psychiatry: normal mood, congruent affect, judgement and insight appear normal   Data Reviewed:  No new labs today.  Notable labs from 4/1 -- Na 132, bicarb 20, glucose 129, Cr improved 1.16, WBC 11.4, hbg stable 10.9  Family Communication: none. Pt updated in detail.  Disposition: Status is: Inpatient Remains inpatient appropriate because: ongoing diarrhea, poor PO intake, fevers - needs further clinical improvement.     Planned Discharge Destination: Skilled nursing facility    Time spent: 42 minutes  Author: Pennie Banter, DO 08/21/2023 1:27 PM  For on call review [email or web address].

## 2023-08-21 NOTE — TOC Initial Note (Signed)
 Transition of Care [facility]) - Initial/Assessment Note    Patient Details  Name: Crystal Miller MRN: [id number] Date of Birth: 1946/01/22  [facility] CM/SW Contact:    Margarito Liner, LCSW Phone Number: 08/21/2023, 12:11 PM  Clinical Narrative:   CSW met with patient. No family at bedside. CSW introduced role and explained that therapy recommendations would be discussed. Patient is agreeable to SNF placement. [facility] is first preference. Admissions coordinator is aware and will review. No further concerns. CSW will continue to follow patient for support and facilitate discharge to SNF once medically stable.               Expected Discharge Plan: Skilled Nursing Facility Barriers to Discharge: Continued Medical Work up   Patient Goals and CMS Choice            Expected Discharge Plan and Services     Post Acute Care Choice: Skilled Nursing Facility Living arrangements for the past 2 months: Apartment                                      Prior Living Arrangements/Services Living arrangements for the past 2 months: Apartment Lives with:: Adult Children Patient language and need for interpreter reviewed:: Yes Do you feel safe going back to the place where you live?: Yes      Need for Family Participation in Patient Care: Yes (Comment) Care giver support system in place?: Yes (comment)   Criminal Activity/Legal Involvement Pertinent to Current Situation/Hospitalization: No - Comment as needed  Activities of Daily Living   ADL Screening (condition at time of admission) Independently performs ADLs?: No Does the patient have a NEW difficulty with bathing/dressing/toileting/self-feeding that is expected to last >3 days?: No Does the patient have a NEW difficulty with getting in/out of bed, walking, or climbing stairs that is expected to last >3 days?: No Does the patient have a NEW difficulty with communication that is expected to last >3 days?: No Is  the patient deaf or have difficulty hearing?: No Does the patient have difficulty seeing, even when wearing glasses/contacts?: No Does the patient have difficulty concentrating, remembering, or making decisions?: No  Permission Sought/Granted Permission sought to share information with : Facility Industrial/product designer granted to share information with : Yes, Verbal Permission Granted     Permission granted to share info w AGENCY: SNF's        Emotional Assessment Appearance:: Appears stated age Attitude/Demeanor/Rapport: Engaged, Gracious Affect (typically observed): Accepting, Appropriate, Calm, Pleasant Orientation: : Oriented to Self, Oriented to Place, Oriented to  Time, Oriented to Situation Alcohol / Substance Use: Not Applicable Psych Involvement: No (comment)  Admission diagnosis:  Mild dehydration [E86.0] Sepsis (HCC) [A41.9] Sepsis without acute organ dysfunction, due to unspecified organism [facility]) [A41.9] Patient Active Problem List   Diagnosis Date Noted   Clostridium difficile colitis 08/19/2023   Sepsis (HCC) 08/17/2023   Pharyngitis 08/17/2023   Hyponatremia 08/17/2023   AKI (acute kidney injury) (HCC) 08/17/2023   Hypocalcemia 08/17/2023   C. difficile colitis 08/17/2023   Transaminitis 08/17/2023   Elevated troponin 08/17/2023   Cerumen impaction 02/01/2023   Atherosclerosis of native arteries of extremity with intermittent claudication (HCC) 03/12/2022   Bruising 01/17/2022   Foot laceration 07/25/2021   Onychomycosis 07/25/2021   Subclinical hypothyroidism 04/23/2020   Chronic kidney disease due to hypertension 12/02/2019   Localized edema 12/02/2019  GERD (gastroesophageal reflux disease) 08/19/2019   Diabetic retinopathy (HCC) 12/04/2018   Overactive bladder 06/30/2018   Mood disorder (HCC) 02/19/2018   PAD (peripheral artery disease) (HCC) 10/10/2017   CVA (cerebral vascular accident) (HCC) 03/27/2017   DM (diabetes mellitus), type 2  with complications (HCC) 11/01/2016   Essential hypertension 11/01/2016   Mixed hyperlipidemia 11/01/2016   Memory change 11/01/2016   History of stroke 10/01/2016   PCP:  Glori Luis, MD (Inactive) Pharmacy:   [facility] [address] [place] - [address] [place] [address] Phone: [phone] Fax: [phone]     Social Drivers of Health (SDOH) Social History: SDOH Screenings   Food Insecurity: No Food Insecurity (08/19/2023)  Housing: Low Risk  (08/19/2023)  Transportation Needs: No Transportation Needs (08/19/2023)  Utilities: Not At Risk (08/19/2023)  Alcohol Screen: Low Risk  (12/04/2022)  Depression (PHQ2-9): Low Risk  (01/30/2023)  Financial Resource Strain: Low Risk  (12/04/2022)  Physical Activity: Sufficiently Active (12/04/2022)  Social Connections: Moderately Integrated (08/19/2023)  Stress: No Stress Concern Present (12/04/2022)  Tobacco Use: Medium Risk (08/19/2023)  Health Literacy: Adequate Health Literacy (12/04/2022)   SDOH Interventions:     Readmission Risk Interventions     No data to display

## 2023-08-22 DIAGNOSIS — A0472 Enterocolitis due to Clostridium difficile, not specified as recurrent: Secondary | ICD-10-CM | POA: Diagnosis not present

## 2023-08-22 DIAGNOSIS — E871 Hypo-osmolality and hyponatremia: Secondary | ICD-10-CM | POA: Diagnosis not present

## 2023-08-22 DIAGNOSIS — I10 Essential (primary) hypertension: Secondary | ICD-10-CM | POA: Diagnosis not present

## 2023-08-22 LAB — BASIC METABOLIC PANEL WITH GFR
Anion gap: 9 (ref 5–15)
BUN: 21 mg/dL (ref 8–23)
CO2: 20 mmol/L — ABNORMAL LOW (ref 22–32)
Calcium: 8.2 mg/dL — ABNORMAL LOW (ref 8.9–10.3)
Chloride: 103 mmol/L (ref 98–111)
Creatinine, Ser: 1.1 mg/dL — ABNORMAL HIGH (ref 0.44–1.00)
GFR, Estimated: 51 mL/min — ABNORMAL LOW (ref >60)
Glucose, Bld: 127 mg/dL — ABNORMAL HIGH (ref 70–99)
Potassium: 4.1 mmol/L (ref 3.5–5.1)
Sodium: 132 mmol/L — ABNORMAL LOW (ref 135–145)

## 2023-08-22 LAB — CULTURE, BLOOD (ROUTINE X 2)
Culture: NO GROWTH
Culture: NO GROWTH
Special Requests: ADEQUATE

## 2023-08-22 LAB — CBC
HCT: 26.6 % — ABNORMAL LOW (ref 36.0–46.0)
Hemoglobin: 9.4 g/dL — ABNORMAL LOW (ref 12.0–15.0)
MCH: 31.2 pg (ref 26.0–34.0)
MCHC: 35.3 g/dL (ref 30.0–36.0)
MCV: 88.4 fL (ref 80.0–100.0)
Platelets: 284 10*3/uL (ref 150–400)
RBC: 3.01 MIL/uL — ABNORMAL LOW (ref 3.87–5.11)
RDW: 12.3 % (ref 11.5–15.5)
WBC: 7.4 10*3/uL (ref 4.0–10.5)
nRBC: 0 % (ref 0.0–0.2)

## 2023-08-22 LAB — PHOSPHORUS: Phosphorus: 3.4 mg/dL (ref 2.5–4.6)

## 2023-08-22 LAB — MAGNESIUM: Magnesium: 1.9 mg/dL (ref 1.7–2.4)

## 2023-08-22 NOTE — Progress Notes (Signed)
 Physical Therapy Treatment Patient Details Name: Crystal Miller MRN: [id number] DOB: [birth year] Today's Date: 08/22/2023   History of Present Illness 78 year old female presents with worsening fever, chills, malaise, anorexia and diarrhea. Admitted for management of sepsis, pharyngitis, hyponatremia and AKI. PMH of CVA, hypertension, GERD, close recent diagnosis of group A streptococcal pharyngitis in the outpatient setting 3 days ago.    PT Comments  Pt received sitting at the EOB eating breakfast agreeable ot PT interventions. Pt is motivated and pleasant. A and O x 4. Pt continues to demonstrate generalized weakness greater proximally. Pt needs Min assist from STS transfers using FWW and ambulated with 4 standing rest breaks in hall way with Min to CGA of 1. Pt continues to be at high risk of fall 2/2 to weakness. AROM to BLE introduced. PT will continue in acute and current POC remains appropriate to help pt return to her PLOF.    If plan is discharge home, recommend the following: A little help with walking and/or transfers;A little help with bathing/dressing/bathroom;Assistance with cooking/housework;Help with stairs or ramp for entrance;Assist for transportation   Can travel by private vehicle        Equipment Recommendations  None recommended by PT    Recommendations for Other Services       Precautions / Restrictions Precautions Precautions: Fall Recall of Precautions/Restrictions: Intact Restrictions Weight Bearing Restrictions Per Provider Order: No     Mobility  Bed Mobility Overal bed mobility: Needs Assistance             General bed mobility comments: received seated at the EOB eating breakfast.    Transfers Overall transfer level: Needs assistance Equipment used: Rolling walker (2 wheels) Transfers: Sit to/from Stand Sit to Stand: Min assist           General transfer comment: pt demosntrates proximal weakness.    Ambulation/Gait Ambulation/Gait  assistance: Min assist Gait Distance (Feet): 180 Feet Assistive device: Rolling walker (2 wheels) Gait Pattern/deviations: Step-through pattern, Decreased step length - right, Decreased step length - left, Trunk flexed Gait velocity: dec     General Gait Details: in hallways with 4 standing rest breaks.   Stairs             Wheelchair Mobility     Tilt Bed    Modified Rankin (Stroke Patients Only)       Balance Overall balance assessment: Needs assistance Sitting-balance support: Feet supported Sitting balance-Leahy Scale: Good Sitting balance - Comments: NO LOB noted   Standing balance support: Bilateral upper extremity supported, Reliant on assistive device for balance Standing balance-Leahy Scale: Fair Standing balance comment: at risk of fall withotu AD and MIn ot CGA assist.                            Communication Communication Communication: No apparent difficulties  Cognition Arousal: Alert Behavior During Therapy: WFL for tasks assessed/performed   PT - Cognitive impairments: No apparent impairments                         Following commands: Intact      Cueing Cueing Techniques: Verbal cues  Exercises General Exercises - Lower Extremity Ankle Circles/Pumps: AROM, Both, 10 reps Long Arc Quad: AROM, Both, 10 reps, Seated Other Exercises Other Exercises: same for inroom every 3 hours 10 reps    General Comments        Pertinent Vitals/Pain Pain Assessment Pain  Assessment: No/denies pain    Home Living Family/patient expects to be discharged to:: Private residence Living Arrangements: Children Available Help at Discharge: Family;Available PRN/intermittently Type of Home: Apartment Home Access: Level entry       Home Layout: One level        Prior Function            PT Goals (current goals can now be found in the care plan section) Acute Rehab PT Goals Patient Stated Goal: " I am OK to get Rehab so I can  return to being like before." PT Goal Formulation: With patient Time For Goal Achievement: 09/01/23 Potential to Achieve Goals: Good    Frequency    Min 2X/week      PT Plan      Co-evaluation              AM-PAC PT "6 Clicks" Mobility   Outcome Measure  Help needed turning from your back to your side while in a flat bed without using bedrails?: A Little Help needed moving from lying on your back to sitting on the side of a flat bed without using bedrails?: A Little Help needed moving to and from a bed to a chair (including a wheelchair)?: A Little Help needed standing up from a chair using your arms (e.g., wheelchair or bedside chair)?: A Little Help needed to walk in hospital room?: A Little Help needed climbing 3-5 steps with a railing? : Total 6 Click Score: 16    End of Session Equipment Utilized During Treatment: Gait belt Activity Tolerance: Patient tolerated treatment well Patient left: with nursing/sitter in room Nurse Communication: Mobility status PT Visit Diagnosis: Unsteadiness on feet (R26.81);History of falling (Z91.81);Muscle weakness (generalized) (M62.81);Difficulty in walking, not elsewhere classified (R26.2)     Time: 0900-0930 PT Time Calculation (min) (ACUTE ONLY): 30 min  Charges:    $Gait Training: 8-22 mins $Therapeutic Exercise: 8-22 mins PT General Charges $$ ACUTE PT VISIT: 1 Visit                    Janet Berlin PT DPT 10:02 AM,08/22/23

## 2023-08-22 NOTE — Progress Notes (Signed)
 Occupational Therapy Treatment Patient Details Name: Crystal Miller MRN: [id number] DOB: [birth year] Today's Date: 08/22/2023   History of present illness 78 year old female presents with worsening fever, chills, malaise, anorexia and diarrhea. Admitted for management of sepsis, pharyngitis, hyponatremia and AKI. PMH of CVA, hypertension, GERD, close recent diagnosis of group A streptococcal pharyngitis in the outpatient setting 3 days ago.   OT comments  Crystal Miller was seen for OT treatment on this date. Upon arrival to room pt seated in chair, agreeable to tx. Pt requires CGA + RW for BSC t/f and MAX A pericare standing. SBA for standing grooming tasks. Pt making good progress toward goals, will continue to follow POC. Discharge recommendation remains appropriate.       If plan is discharge home, recommend the following:  A little help with walking and/or transfers;A lot of help with bathing/dressing/bathroom;Assistance with cooking/housework;Assist for transportation;Help with stairs or ramp for entrance   Equipment Recommendations  BSC/3in1;Other (comment) (RW)    Recommendations for Other Services      Precautions / Restrictions Precautions Precautions: Fall Recall of Precautions/Restrictions: Intact Restrictions Weight Bearing Restrictions Per Provider Order: No       Mobility Bed Mobility                    Transfers Overall transfer level: Needs assistance Equipment used: Rolling walker (2 wheels) Transfers: Sit to/from Stand Sit to Stand: Contact guard assist                 Balance Overall balance assessment: Needs assistance Sitting-balance support: Feet supported Sitting balance-Leahy Scale: Good     Standing balance support: No upper extremity supported, During functional activity Standing balance-Leahy Scale: Fair                             ADL either performed or assessed with clinical judgement   ADL Overall ADL's : Needs  assistance/impaired                                       General ADL Comments: CGA + RW for BSC t/f and MAX A pericare standing. SBA for standing grooming tasks.    Extremity/Trunk Assessment     Lower Extremity Assessment Lower Extremity Assessment: Generalized weakness   Cervical / Trunk Assessment Cervical / Trunk Assessment: Kyphotic             Communication Communication Communication: No apparent difficulties   Cognition Arousal: Alert Behavior During Therapy: WFL for tasks assessed/performed Cognition: No apparent impairments                               Following commands: Intact        Cueing   Cueing Techniques: Verbal cues             Pertinent Vitals/ Pain       Pain Assessment Pain Assessment: No/denies pain  Home Living Family/patient expects to be discharged to:: Private residence Living Arrangements: Children Available Help at Discharge: Family;Available PRN/intermittently Type of Home: Apartment Home Access: Level entry     Home Layout: One level     Bathroom Shower/Tub: Chief Strategy Officer: Standard                Prior Functioning/Environment  Frequency  Min 2X/week        Progress Toward Goals  OT Goals(current goals can now be found in the care plan section)  Progress towards OT goals: Progressing toward goals  Acute Rehab OT Goals OT Goal Formulation: With patient/family Time For Goal Achievement: 09/01/23 Potential to Achieve Goals: Good ADL Goals Pt Will Perform Lower Body Bathing: with contact guard assist;with min assist;sit to/from stand;sitting/lateral leans Pt Will Perform Lower Body Dressing: with min assist;with contact guard assist;sit to/from stand;sitting/lateral leans Pt Will Transfer to Toilet: with min assist;with contact guard assist;ambulating;regular height toilet;bedside commode Pt Will Perform Toileting - Clothing Manipulation and  hygiene: with contact guard assist;with min assist;sit to/from stand;sitting/lateral leans  Plan      Co-evaluation                 AM-PAC OT "6 Clicks" Daily Activity     Outcome Measure   Help from another person eating meals?: None Help from another person taking care of personal grooming?: A Little Help from another person toileting, which includes using toliet, bedpan, or urinal?: A Lot Help from another person bathing (including washing, rinsing, drying)?: A Lot Help from another person to put on and taking off regular upper body clothing?: A Little Help from another person to put on and taking off regular lower body clothing?: A Lot 6 Click Score: 16    End of Session Equipment Utilized During Treatment: Rolling walker (2 wheels)  OT Visit Diagnosis: Other abnormalities of gait and mobility (R26.89);Muscle weakness (generalized) (M62.81);Unsteadiness on feet (R26.81)   Activity Tolerance Patient tolerated treatment well   Patient Left in chair;with call bell/phone within reach;with chair alarm set   Nurse Communication Mobility status        Time: 0981-1914 OT Time Calculation (min): 17 min  Charges: OT General Charges $OT Visit: 1 Visit OT Treatments $Self Care/Home Management : 8-22 mins  Kathie Dike, M.S. OTR/L  08/22/23, 11:12 AM  ascom [phone]

## 2023-08-22 NOTE — Progress Notes (Signed)
 Progress Note   Patient: Crystal Miller NGE:[id number] DOB: February 07, 1946 DOA: 08/17/2023     5 DOS: the patient was seen and examined on 08/22/2023   Brief hospital course:  "Crystal Miller is a 78 y.o. adult with a PMH significant for cerebrovascular accident hypertension gastroesophageal reflux disease close recent diagnosis of group A streptococcal pharyngitis in the outpatient setting 3 days ago presents to the emergency department with worsening fever chills malaise anorexia and diarrhea.  She was treated with amoxicillin outpatient.  Group A strep testing negative upon admission.  Had poor PO intake due to odynophagia. "  Patient was admitted for further evaluation and management. Stool studies positive for C diff infection and EPEC.   Patient is being treated with oral vancomycin and azithromycin.   4/2 -- fevers with Tmax 101.2 F at 5AM today, ongoing diarrhea. 4/3 -- no fevers, ongoing diarrhea     Assessment and Plan:  Sepsis- Blood cultures NGTD. Sepsis criteria have resolved.  C. difficile colitis, EPEC 4/2 -- ongoing fevers and diarrhea, poor PO intake. - continue vancomycin PO x 10 day course - continue azithromycin PO - tylenol for fever - Crystal Miller/OT evaluated and recommending SNF. TOC engaged   Group A strep pharyngitis- neg PCR on admission. Unsure if completed amoxicillin.No abnormalities on CT neck - supportive care PRN   Hyponatremia- 127>>>132.  Improving with PO intake and resolution of diarrhea. S/p IV fluid --Monitor BMP   AKI- closer to baseline. Cr 1.40>>1.47>>1.16>>1.10 today --Monitor BMP     Elevated troponin No chest pain likely secondary to the patient's CKD troponin was 24 -> 19 ECG is normal sinus rhythm severely reviewed and interpreted with ventricular rate of 82 no ST changes QTc 422   Transaminitis- neg acute hepatitis panel. Fatty liver on [place]    Hypocalcemia --Corrected calcium 8.6 with albumin of 2.9 and calcium of 8.1  --supplement  calcium   GERD - --Continue the patient's PPI    CVA --Continue the patient's home aspirin and atorvastatin 40 mg   Mood disorder (HCC) --Continue sertraline 25 mg daily   Essential hypertension --Resumed on home amlodipine and Coreg that were initially held for soft BP's        Subjective: Crystal Miller seen awake resting in bed this AM.  She reports still having watery diarrhea last night, poor appetite maybe slightly better.  No fever/chills.  Overall feeling better slowly.   Physical Exam: Vitals:   08/21/23 2043 08/22/23 0517 08/22/23 0520 08/22/23 0751  BP: (!) 151/51 (!) 132/45 (!) (P) 132/45 (!) 141/47  Pulse: 73 73 (P) 73 69  Resp: 18 20 (P) 20 18  Temp: 99.7 F (37.6 C) 98.2 F (36.8 C) (P) 98.2 F (36.8 C) 98.3 F (36.8 C)  TempSrc:   (P) Oral   SpO2: 97% 95% (P) 95% 96%  Weight:      Height:       General exam: awake, alert, no acute distress Respiratory system: on room air, normal respiratory effort. Cardiovascular system: RRR, no pedal edema.   Gastrointestinal system: soft, NT, ND Central nervous system: A&O x 3. no gross focal neurologic deficits, normal speech Extremities: moves all , no edema, normal tone Skin: dry, intact, normal temperature Psychiatry: normal mood, congruent affect    Data Reviewed:  No new labs today.  Notable labs -- Na 132 stable, bicarb 20 stable, Cr 1.16 >> 1.10, Ca 8.2, Hbg 9.4 fluctuating in 9-10's  Family Communication: none present, will attempt to call as time allows.  Disposition: Status is: Inpatient Remains inpatient appropriate because: ongoing diarrhea, poor PO intake - needs further clinical improvement.     Planned Discharge Destination: Skilled nursing facility    Time spent: 38 minutes  Author: Pennie Banter, DO 08/22/2023 12:01 PM  For on call review [email or web address].

## 2023-08-22 NOTE — Plan of Care (Signed)

## 2023-08-23 DIAGNOSIS — I10 Essential (primary) hypertension: Secondary | ICD-10-CM | POA: Diagnosis not present

## 2023-08-23 DIAGNOSIS — A0472 Enterocolitis due to Clostridium difficile, not specified as recurrent: Secondary | ICD-10-CM | POA: Diagnosis not present

## 2023-08-23 DIAGNOSIS — E871 Hypo-osmolality and hyponatremia: Secondary | ICD-10-CM | POA: Diagnosis not present

## 2023-08-23 LAB — COMPREHENSIVE METABOLIC PANEL WITH GFR
ALT: 69 U/L — ABNORMAL HIGH (ref 0–44)
AST: 56 U/L — ABNORMAL HIGH (ref 15–41)
Albumin: 2.4 g/dL — ABNORMAL LOW (ref 3.5–5.0)
Alkaline Phosphatase: 87 U/L (ref 38–126)
Anion gap: 6 (ref 5–15)
BUN: 24 mg/dL — ABNORMAL HIGH (ref 8–23)
CO2: 23 mmol/L (ref 22–32)
Calcium: 8.3 mg/dL — ABNORMAL LOW (ref 8.9–10.3)
Chloride: 103 mmol/L (ref 98–111)
Creatinine, Ser: 1.2 mg/dL — ABNORMAL HIGH (ref 0.44–1.00)
GFR, Estimated: 46 mL/min — ABNORMAL LOW (ref >60)
Glucose, Bld: 106 mg/dL — ABNORMAL HIGH (ref 70–99)
Potassium: 4.5 mmol/L (ref 3.5–5.1)
Sodium: 132 mmol/L — ABNORMAL LOW (ref 135–145)
Total Bilirubin: <0.2 mg/dL (ref 0.0–1.2)
Total Protein: 6.1 g/dL — ABNORMAL LOW (ref 6.5–8.1)

## 2023-08-23 NOTE — Progress Notes (Signed)
 Mobility Specialist - Progress Note     08/23/23 1537  Mobility  Activity Ambulated with assistance in hallway  Level of Assistance Standby assist, set-up cues, supervision of patient - no hands on  Assistive Device Front wheel walker  Distance Ambulated (ft) 160 ft  Range of Motion/Exercises Active  Activity Response Tolerated well  Mobility Referral Yes  Mobility visit 1 Mobility   Pt resting in bed on RA upon entry. Pt STS and ambulates around NS SBA with RW. Pt returned EOB and left with needs in reach. Pt bed alarm activated.   Johnathan Hausen Mobility Specialist 08/23/23, 3:50 PM

## 2023-08-23 NOTE — Progress Notes (Addendum)
 Progress Note   Patient: Crystal Miller AOZ:[id number] DOB: [birth year] DOA: 08/17/2023     6 DOS: the patient was seen and examined on 08/23/2023   Brief hospital course:  "Crystal Miller is a 78 y.o. adult with a PMH significant for cerebrovascular accident hypertension gastroesophageal reflux disease close recent diagnosis of group A streptococcal pharyngitis in the outpatient setting 3 days ago presents to the emergency department with worsening fever chills malaise anorexia and diarrhea.  She was treated with amoxicillin outpatient.  Group A strep testing negative upon admission.  Had poor PO intake due to odynophagia. "  Patient was admitted for further evaluation and management. Stool studies positive for C diff infection and EPEC.   Patient is being treated with oral vancomycin and azithromycin.   4/2 -- fevers with Tmax 101.2 F at 5AM today, ongoing diarrhea. 4/3 -- no fevers, ongoing diarrhea type 7 4/4 -- ongoing diarrhea, improving frequency, now type 6     Assessment and Plan:  Severe Sepsis- Blood cultures NGTD. Severe sepsis criteria present on admission have resolved.  AKI consistent with organ dysfunction. C. difficile colitis, EPEC 4/4 - diarrhea less frequent, now type 6 - continue vancomycin PO x 10 day course - continue azithromycin PO - tylenol for fever - PT/OT evaluated and recommending SNF. TOC engaged   Group A strep pharyngitis- neg PCR on admission. Unsure if completed amoxicillin.No abnormalities on CT neck - supportive care PRN   Hyponatremia- 132 past few days & stable.  Improving with PO intake and resolution of diarrhea. S/p IV fluid --Monitor BMP   AKI- resolved.  Cr 1.40>>1.47>>1.16>>1.10  --Monitor BMP     Elevated troponin No chest pain likely secondary to the patient's CKD troponin was 24 -> 19 ECG is normal sinus rhythm severely reviewed and interpreted with ventricular rate of 82 no ST changes QTc 422   Transaminitis- neg acute  hepatitis panel. Fatty liver on [place]    Hypocalcemia --Corrected calcium 8.6 with albumin of 2.9 and calcium of 8.1  --supplement calcium   GERD - --Continue the patient's PPI    CVA --Continue the patient's home aspirin and atorvastatin 40 mg   Mood disorder (HCC) --Continue sertraline 25 mg daily   Essential hypertension --Resumed on home amlodipine and Coreg that were initially held for soft BP's        Subjective: Pt up in recliner when seen this AM.  She reports feeling better but still having diarrhea.  Three episodes yesterday but states not as watery, still very loose.   Physical Exam: Vitals:   08/22/23 1522 08/22/23 1947 08/23/23 0509 08/23/23 0849  BP: (!) 131/44 (!) 141/55 (!) 149/60 (!) 112/51  Pulse: 67 72 74 72  Resp: 18  18 17   Temp: 98.1 F (36.7 C) 99.1 F (37.3 C) 99.8 F (37.7 C) 98.1 F (36.7 C)  TempSrc:   Oral   SpO2: 100% 98% 93% 98%  Weight:      Height:       General exam: awake, alert, no acute distress Respiratory system: on room air, normal respiratory effort. Cardiovascular system: RRR, no pedal edema.   Gastrointestinal system: soft, NT, ND Central nervous system: A&O x 3. no gross focal neurologic deficits, normal speech Extremities: moves all , no edema, normal tone Skin: dry, intact, normal temperature Psychiatry: normal mood, congruent affect    Data Reviewed:  No new labs today.  Notable labs --  Na 132 stable Cr 1.16 >> 1.10 >> 1.20 Ca 8.3  Hbg 9.4 fluctuating in 9-10's    Family Communication: daughter updated by phone this afternoon 4/4.  Disposition: Status is: Inpatient Remains inpatient appropriate because: ongoing diarrhea, monitoring labs for dehydration or electrolyte derangements.  Needs SNF placement.    Planned Discharge Destination: Skilled nursing facility    Time spent: 35 minutes  Author: Pennie Banter, DO 08/23/2023 10:53 AM  For on call review [email or web address].

## 2023-08-23 NOTE — TOC Progression Note (Signed)
 Transition of Care [facility]) - Progression Note    Patient Details  Name: [name] MRN: [id number] Date of Birth: Aug 20, 1945  Transition of Care [facility]) CM/SW Contact  Cherre Blanc, RN Phone Number: 08/23/2023, 2:11 PM  Clinical Narrative:     Patient with a bed offer at [facility] when she is medically clear for dc.  TOC will continue to follow  Expected Discharge Plan: Skilled Nursing Facility Barriers to Discharge: Continued Medical Work up  Expected Discharge Plan and Services     Post Acute Care Choice: Skilled Nursing Facility Living arrangements for the past 2 months: Apartment                                       Social Determinants of Health (SDOH) Interventions SDOH Screenings   Food Insecurity: No Food Insecurity (08/19/2023)  Housing: Low Risk  (08/19/2023)  Transportation Needs: No Transportation Needs (08/19/2023)  Utilities: Not At Risk (08/19/2023)  Alcohol Screen: Low Risk  (12/04/2022)  Depression (PHQ2-9): Low Risk  (01/30/2023)  Financial Resource Strain: Low Risk  (12/04/2022)  Physical Activity: Sufficiently Active (12/04/2022)  Social Connections: Moderately Integrated (08/19/2023)  Stress: No Stress Concern Present (12/04/2022)  Tobacco Use: Medium Risk (08/19/2023)  Health Literacy: Adequate Health Literacy (12/04/2022)    Readmission Risk Interventions     No data to display

## 2023-08-23 NOTE — Plan of Care (Signed)

## 2023-08-24 DIAGNOSIS — A0472 Enterocolitis due to Clostridium difficile, not specified as recurrent: Secondary | ICD-10-CM | POA: Diagnosis not present

## 2023-08-24 DIAGNOSIS — I10 Essential (primary) hypertension: Secondary | ICD-10-CM | POA: Diagnosis not present

## 2023-08-24 LAB — BASIC METABOLIC PANEL WITH GFR
Anion gap: 8 (ref 5–15)
BUN: 25 mg/dL — ABNORMAL HIGH (ref 8–23)
CO2: 20 mmol/L — ABNORMAL LOW (ref 22–32)
Calcium: 8.4 mg/dL — ABNORMAL LOW (ref 8.9–10.3)
Chloride: 103 mmol/L (ref 98–111)
Creatinine, Ser: 1.2 mg/dL — ABNORMAL HIGH (ref 0.44–1.00)
GFR, Estimated: 46 mL/min — ABNORMAL LOW (ref >60)
Glucose, Bld: 112 mg/dL — ABNORMAL HIGH (ref 70–99)
Potassium: 4.3 mmol/L (ref 3.5–5.1)
Sodium: 131 mmol/L — ABNORMAL LOW (ref 135–145)

## 2023-08-24 LAB — CBC
HCT: 27.2 % — ABNORMAL LOW (ref 36.0–46.0)
Hemoglobin: 9.6 g/dL — ABNORMAL LOW (ref 12.0–15.0)
MCH: 30.7 pg (ref 26.0–34.0)
MCHC: 35.3 g/dL (ref 30.0–36.0)
MCV: 86.9 fL (ref 80.0–100.0)
Platelets: 338 10*3/uL (ref 150–400)
RBC: 3.13 MIL/uL — ABNORMAL LOW (ref 3.87–5.11)
RDW: 12.4 % (ref 11.5–15.5)
WBC: 6.3 10*3/uL (ref 4.0–10.5)
nRBC: 0 % (ref 0.0–0.2)

## 2023-08-24 NOTE — Plan of Care (Signed)

## 2023-08-24 NOTE — TOC Progression Note (Signed)
 Transition of Care [facility]) - Progression Note    Patient Details  Name: Crystal Miller MRN: [id number] Date of Birth: 01/22/1946  Transition of Care [facility]) CM/SW Contact  Rodney Langton, RN Phone Number: 08/24/2023, 11:16 AM  Clinical Narrative:     Patient has authorization for SNF, attempted to reach out to admissions coordinator in regards to possible discharge today twice without success.  Called to [facility] [employer], notified that they don't have anyone to cover admissions on the weekends, patient will have to wait until Monday.   Expected Discharge Plan: Skilled Nursing Facility Barriers to Discharge: Continued Medical Work up  Expected Discharge Plan and Services     Post Acute Care Choice: Skilled Nursing Facility Living arrangements for the past 2 months: Apartment                                       Social Determinants of Health (SDOH) Interventions SDOH Screenings   Food Insecurity: No Food Insecurity (08/19/2023)  Housing: Low Risk  (08/19/2023)  Transportation Needs: No Transportation Needs (08/19/2023)  Utilities: Not At Risk (08/19/2023)  Alcohol Screen: Low Risk  (12/04/2022)  Depression (PHQ2-9): Low Risk  (01/30/2023)  Financial Resource Strain: Low Risk  (12/04/2022)  Physical Activity: Sufficiently Active (12/04/2022)  Social Connections: Moderately Integrated (08/19/2023)  Stress: No Stress Concern Present (12/04/2022)  Tobacco Use: Medium Risk (08/19/2023)  Health Literacy: Adequate Health Literacy (12/04/2022)    Readmission Risk Interventions     No data to display

## 2023-08-24 NOTE — Progress Notes (Signed)
 Progress Note   Patient: Crystal Miller ZOX:[id number] DOB: January 03, 1946 DOA: 08/17/2023     7 DOS: the patient was seen and examined on 08/24/2023   Brief hospital course:  "Crystal Miller is a 78 y.o. adult with a PMH significant for cerebrovascular accident hypertension gastroesophageal reflux disease close recent diagnosis of group A streptococcal pharyngitis in the outpatient setting 3 days ago presents to the emergency department with worsening fever chills malaise anorexia and diarrhea.  She was treated with amoxicillin outpatient.  Group A strep testing negative upon admission.  Had poor PO intake due to odynophagia. "  Patient was admitted for further evaluation and management. Stool studies positive for C diff infection and EPEC.   Patient is being treated with oral vancomycin and azithromycin.   4/2 -- fevers with Tmax 101.2 F at 5AM today, ongoing diarrhea. 4/3 -- no fevers, ongoing diarrhea type 7 4/4 -- ongoing diarrhea, improving frequency, now type 6 4/5 -- diarrhea improved.  Medically stable for SNF discharge.    Assessment and Plan:  Severe Sepsis- Blood cultures NGTD. Severe sepsis criteria present on admission have resolved.  AKI consistent with organ dysfunction. C. difficile colitis, EPEC 4/4 - diarrhea less frequent, now type 6 - continue vancomycin PO x 10 day course - continue azithromycin PO - tylenol for fever - PT/OT evaluated and recommending SNF. TOC engaged   Group A strep pharyngitis- neg PCR on admission. Unsure if completed amoxicillin.No abnormalities on CT neck - supportive care PRN   Hyponatremia- stable in low 130's Improving with PO intake and resolution of diarrhea. S/p IV fluid --Monitor BMP   AKI- resolved.  Cr 1.40>>1.47>>1.16>>1.10 >>1.20 >> 1.20 --Monitor BMP     Elevated troponin No chest pain likely secondary to the patient's CKD troponin was 24 -> 19 ECG is normal sinus rhythm severely reviewed and interpreted with ventricular  rate of 82 no ST changes QTc 422   Transaminitis- neg acute hepatitis panel. Fatty liver on [place]    Hypocalcemia --Corrected calcium 8.6 with albumin of 2.9 and calcium of 8.1  --supplement calcium   GERD - --Continue the patient's PPI    CVA --Continue the patient's home aspirin and atorvastatin 40 mg   Mood disorder (HCC) --Continue sertraline 25 mg daily   Essential hypertension --Resumed on home amlodipine and Coreg that were initially held for soft BP's        Subjective: Pt awake sitting up in bed this AM. She reports feeling better and has not had diarrhea after her last couple of meals like she was having.  She denies other complaints.    Physical Exam: Vitals:   08/23/23 0849 08/23/23 1610 08/23/23 2050 08/24/23 0802  BP: (!) 112/51 (!) 119/38 (!) 146/61 (!) 149/46  Pulse: 72 73 69 67  Resp: 17 18 18 16   Temp: 98.1 F (36.7 C)  98.1 F (36.7 C) 98.6 F (37 C)  TempSrc:   Oral Oral  SpO2: 98% 99% 98% 92%  Weight:      Height:       General exam: awake, alert, no acute distress Respiratory system: on room air, normal respiratory effort. Cardiovascular system: RRR, no pedal edema.   Gastrointestinal system: soft, NT, ND Central nervous system: A&O x 3. no gross focal neurologic deficits, normal speech Extremities: moves all , no edema, normal tone Skin: dry, intact, normal temperature Psychiatry: normal mood, congruent affect    Data Reviewed:  Notable labs --  Na 132 > 131 Bicarb 20 Glucose  112 BUN 25 Cr 1.20 stable Ca 8.4 Hbg 9.6 stable    Family Communication: daughter updated by phone afternoon 4/4.  Disposition: Status is: Inpatient Remains inpatient appropriate because:  Needs SNF placement.  Medically stable.    Planned Discharge Destination: Skilled nursing facility    Time spent: 35 minutes  Author: Pennie Banter, DO 08/24/2023 11:49 AM  For on call review [email or web address].

## 2023-08-25 DIAGNOSIS — K219 Gastro-esophageal reflux disease without esophagitis: Secondary | ICD-10-CM

## 2023-08-25 DIAGNOSIS — A0472 Enterocolitis due to Clostridium difficile, not specified as recurrent: Secondary | ICD-10-CM | POA: Diagnosis not present

## 2023-08-25 DIAGNOSIS — I10 Essential (primary) hypertension: Secondary | ICD-10-CM | POA: Diagnosis not present

## 2023-08-25 NOTE — Progress Notes (Signed)
 Progress Note   Patient: Crystal Miller ZOX:[id number] DOB: 1946/02/01 DOA: 08/17/2023     8 DOS: the patient was seen and examined on 08/25/2023   Brief hospital course:  "Crystal Miller is a 78 y.o. adult with a PMH significant for cerebrovascular accident hypertension gastroesophageal reflux disease close recent diagnosis of group A streptococcal pharyngitis in the outpatient setting 3 days ago presents to the emergency department with worsening fever chills malaise anorexia and diarrhea.  She was treated with amoxicillin outpatient.  Group A strep testing negative upon admission.  Had poor PO intake due to odynophagia. "  Patient was admitted for further evaluation and management. Stool studies positive for C diff infection and EPEC.   Patient is being treated with oral vancomycin and azithromycin.   4/2 -- fevers with Tmax 101.2 F at 5AM today, ongoing diarrhea. 4/3 -- no fevers, ongoing diarrhea type 7 4/4 -- ongoing diarrhea, improving frequency, now type 6 4/5 -- diarrhea improved.  Medically stable for SNF discharge.    Assessment and Plan:  Severe Sepsis- Blood cultures NGTD. Severe sepsis criteria present on admission have resolved.  AKI consistent with organ dysfunction. C. difficile colitis, EPEC 4/4 - diarrhea less frequent, now type 6 4/5 - diarrhea resolved - continue vancomycin PO x 10 day course - continue azithromycin PO - tylenol for fever - PT/OT evaluated and recommending SNF. TOC engaged   Group A strep pharyngitis- neg PCR on admission. Unsure if completed amoxicillin.No abnormalities on CT neck - supportive care PRN   Hyponatremia- stable in low 130's Improving with PO intake and resolution of diarrhea. S/p IV fluid --Monitor BMP   AKI- resolved.  Cr 1.40>>1.47>>1.16>>1.10 >>1.20 >> 1.20 --Monitor BMP     Elevated troponin No chest pain likely secondary to the patient's CKD troponin was 24 -> 19 ECG is normal sinus rhythm severely reviewed and  interpreted with ventricular rate of 82 no ST changes QTc 422   Transaminitis- neg acute hepatitis panel. Fatty liver on [place]    Hypocalcemia --Corrected calcium 8.6 with albumin of 2.9 and calcium of 8.1  --supplement calcium   GERD - --Continue the patient's PPI    CVA --Continue the patient's home aspirin and atorvastatin 40 mg   Mood disorder (HCC) --Continue sertraline 25 mg daily   Essential hypertension --Resumed on home amlodipine and Coreg that were initially held for soft BP's        Subjective: Pt sleeping comfortably when seen this AM.  No acute events or complaints.  Woke briefly, no complaints besides being tired today.    Physical Exam: Vitals:   08/23/23 2050 08/24/23 0802 08/24/23 2017 08/25/23 0959  BP: (!) 146/61 (!) 149/46 (!) 124/98 (!) 110/52  Pulse: 69 67 70 70  Resp: 18 16 20 16   Temp: 98.1 F (36.7 C) 98.6 F (37 C) 98.3 F (36.8 C) 98.2 F (36.8 C)  TempSrc: Oral Oral Oral   SpO2: 98% 92% 95% 94%  Weight:      Height:       General exam: sleeping comfortably, no acute distress Respiratory system: on room air, normal respiratory effort. CTAB no wheezes or rhonchi Cardiovascular system: RRR, no pedal edema.   Gastrointestinal system: soft, NT, ND Central nervous system: no gross focal neurologic deficits, limited due to somnolence Extremities: moves all , no edema, normal tone Skin: dry, intact, normal temperature Psychiatry: limited due to somnolence    Data Reviewed:  No new labs today  Notable labs 4/5--  Na  132 > 131 Bicarb 20 Glucose 112 BUN 25 Cr 1.20 stable Ca 8.4 Hbg 9.6 stable    Family Communication: daughter updated by phone afternoon 4/4.  Disposition: Status is: Inpatient Remains inpatient appropriate because:  Needs SNF placement.  Medically stable.    Planned Discharge Destination: Skilled nursing facility      Time spent: 35 minutes  Author: Pennie Banter, DO 08/25/2023 12:33 PM  For on call  review [email or web address].

## 2023-08-26 DIAGNOSIS — A0472 Enterocolitis due to Clostridium difficile, not specified as recurrent: Secondary | ICD-10-CM | POA: Diagnosis not present

## 2023-08-26 DIAGNOSIS — Z8673 Personal history of transient ischemic attack (TIA), and cerebral infarction without residual deficits: Secondary | ICD-10-CM | POA: Diagnosis not present

## 2023-08-26 DIAGNOSIS — R6520 Severe sepsis without septic shock: Secondary | ICD-10-CM | POA: Diagnosis not present

## 2023-08-26 DIAGNOSIS — E119 Type 2 diabetes mellitus without complications: Secondary | ICD-10-CM | POA: Diagnosis not present

## 2023-08-26 DIAGNOSIS — I10 Essential (primary) hypertension: Secondary | ICD-10-CM | POA: Diagnosis not present

## 2023-08-26 DIAGNOSIS — E871 Hypo-osmolality and hyponatremia: Secondary | ICD-10-CM | POA: Diagnosis not present

## 2023-08-26 DIAGNOSIS — I129 Hypertensive chronic kidney disease with stage 1 through stage 4 chronic kidney disease, or unspecified chronic kidney disease: Secondary | ICD-10-CM | POA: Diagnosis not present

## 2023-08-26 DIAGNOSIS — J020 Streptococcal pharyngitis: Secondary | ICD-10-CM | POA: Diagnosis not present

## 2023-08-26 DIAGNOSIS — F39 Unspecified mood [affective] disorder: Secondary | ICD-10-CM | POA: Diagnosis not present

## 2023-08-26 DIAGNOSIS — N183 Chronic kidney disease, stage 3 unspecified: Secondary | ICD-10-CM | POA: Diagnosis not present

## 2023-08-26 DIAGNOSIS — F324 Major depressive disorder, single episode, in partial remission: Secondary | ICD-10-CM | POA: Diagnosis not present

## 2023-08-26 DIAGNOSIS — K219 Gastro-esophageal reflux disease without esophagitis: Secondary | ICD-10-CM | POA: Diagnosis not present

## 2023-08-26 DIAGNOSIS — F32A Depression, unspecified: Secondary | ICD-10-CM | POA: Diagnosis not present

## 2023-08-26 DIAGNOSIS — N3281 Overactive bladder: Secondary | ICD-10-CM | POA: Diagnosis not present

## 2023-08-26 DIAGNOSIS — E1122 Type 2 diabetes mellitus with diabetic chronic kidney disease: Secondary | ICD-10-CM | POA: Diagnosis not present

## 2023-08-26 DIAGNOSIS — E870 Hyperosmolality and hypernatremia: Secondary | ICD-10-CM | POA: Diagnosis not present

## 2023-08-26 DIAGNOSIS — R600 Localized edema: Secondary | ICD-10-CM | POA: Diagnosis not present

## 2023-08-26 DIAGNOSIS — R7989 Other specified abnormal findings of blood chemistry: Secondary | ICD-10-CM | POA: Diagnosis not present

## 2023-08-26 DIAGNOSIS — N179 Acute kidney failure, unspecified: Secondary | ICD-10-CM | POA: Diagnosis not present

## 2023-08-26 MED ORDER — VANCOMYCIN HCL 125 MG PO CAPS: 125.0000 mg | ORAL_CAPSULE | Freq: Four times a day (QID) | ORAL | 0 refills | Status: AC

## 2023-08-26 MED ORDER — ENSURE ENLIVE PO LIQD: 237.0000 mL | Freq: Two times a day (BID) | ORAL | Status: DC

## 2023-08-26 MED ORDER — NYSTATIN 100000 UNIT/GM EX POWD: Freq: Two times a day (BID) | CUTANEOUS | Status: DC

## 2023-08-26 NOTE — TOC Progression Note (Addendum)
 Transition of Care [facility]) - Progression Note    Patient Details  Name: Crystal Miller MRN: [id number] Date of Birth: Jun 22, 1945  Transition of Care [facility]) CM/SW Contact  Margarito Liner, LCSW Phone Number: 08/26/2023, 8:44 AM  Clinical Narrative:   CSW left message for [facility] admissions coordinator to provide authorization details and see if they can accept patient today if stable.  10:26 am: [facility] can accept patient today if stable. Sent secure chat to attending physician and RN.  Expected Discharge Plan: Skilled Nursing Facility Barriers to Discharge: Continued Medical Work up  Expected Discharge Plan and Services     Post Acute Care Choice: Skilled Nursing Facility Living arrangements for the past 2 months: Apartment                                       Social Determinants of Health (SDOH) Interventions SDOH Screenings   Food Insecurity: No Food Insecurity (08/19/2023)  Housing: Low Risk  (08/19/2023)  Transportation Needs: No Transportation Needs (08/19/2023)  Utilities: Not At Risk (08/19/2023)  Alcohol Screen: Low Risk  (12/04/2022)  Depression (PHQ2-9): Low Risk  (01/30/2023)  Financial Resource Strain: Low Risk  (12/04/2022)  Physical Activity: Sufficiently Active (12/04/2022)  Social Connections: Moderately Integrated (08/19/2023)  Stress: No Stress Concern Present (12/04/2022)  Tobacco Use: Medium Risk (08/19/2023)  Health Literacy: Adequate Health Literacy (12/04/2022)    Readmission Risk Interventions     No data to display

## 2023-08-26 NOTE — TOC Transition Note (Signed)
 Transition of Care [facility]) - Discharge Note   Patient Details  Name: Crystal Miller MRN: [id number] Date of Birth: [birth year]  Transition of Care [facility]) CM/SW Contact:  Margarito Liner, LCSW Phone Number: 08/26/2023, 11:39 AM   Clinical Narrative:   Patient has orders to discharge to [facility] today. RN will call report to [phone] (Room 610). Daughter will transport this afternoon. No further concerns. CSW signing off.  Final next level of care: Skilled Nursing Facility Barriers to Discharge: Barriers Resolved   Patient Goals and CMS Choice            Discharge Placement   Existing PASRR number confirmed : 08/21/23          Patient chooses bed at: [facility] Patient to be transferred to facility by: Daughter Name of family member notified: Chrischelle Watson Patient and family notified of of transfer: 08/26/23  Discharge Plan and Services Additional resources added to the After Visit Summary for       Post Acute Care Choice: Skilled Nursing Facility                               Social Drivers of Health (SDOH) Interventions SDOH Screenings   Food Insecurity: No Food Insecurity (08/19/2023)  Housing: Low Risk  (08/19/2023)  Transportation Needs: No Transportation Needs (08/19/2023)  Utilities: Not At Risk (08/19/2023)  Alcohol Screen: Low Risk  (12/04/2022)  Depression (PHQ2-9): Low Risk  (01/30/2023)  Financial Resource Strain: Low Risk  (12/04/2022)  Physical Activity: Sufficiently Active (12/04/2022)  Social Connections: Moderately Integrated (08/19/2023)  Stress: No Stress Concern Present (12/04/2022)  Tobacco Use: Medium Risk (08/19/2023)  Health Literacy: Adequate Health Literacy (12/04/2022)     Readmission Risk Interventions     No data to display

## 2023-08-26 NOTE — Discharge Summary (Signed)
 Physician Discharge Summary   Patient: Crystal Miller MRN: [id number] DOB: 14-Jan-1946  Admit date:     08/17/2023  Discharge date: 08/26/23  Discharge Physician: Pennie Banter   PCP: Glori Luis, MD (Inactive)   Recommendations at discharge:    Follow up with Primary Care in 1-2 weeks Repeat CMP, CBC at follow up Follow up on recovery from C diff infection and monitor closely for recurrence.  Minimize use of antibiotics as much as possible.   Discharge Diagnoses: Principal Problem:   Sepsis (HCC) Active Problems:   Pharyngitis   Hyponatremia   AKI (acute kidney injury) (HCC)   C. difficile colitis   Essential hypertension   Mood disorder (HCC)   CVA (cerebral vascular accident) (HCC)   GERD (gastroesophageal reflux disease)   Hypocalcemia   Transaminitis   Elevated troponin   Clostridium difficile colitis  Resolved Problems:   * No resolved hospital problems. *  Hospital Course:  "Crystal Miller is a 78 y.o. adult with a PMH significant for cerebrovascular accident hypertension gastroesophageal reflux disease close recent diagnosis of group A streptococcal pharyngitis in the outpatient setting 3 days ago presents to the emergency department with worsening fever chills malaise anorexia and diarrhea.  She was treated with amoxicillin outpatient.  Group A strep testing negative upon admission.  Had poor PO intake due to odynophagia. "   Patient was admitted for further evaluation and management. Stool studies positive for C diff infection and EPEC.    Patient is being treated with oral vancomycin and azithromycin.     4/2 -- fevers with Tmax 101.2 F at 5AM today, ongoing diarrhea. 4/3 -- no fevers, ongoing diarrhea type 7 4/4 -- ongoing diarrhea, improving frequency, now type 6 4/5 -- diarrhea improved.  Medically stable for SNF discharge.   4/7 -- patient continues to feel improved and without acute complaints.  Remains medically stable for discharge to  SNF/rehab today.   Assessment and Plan:  Severe Sepsis- Blood cultures NGTD. Severe sepsis criteria present on admission have resolved.  AKI consistent with organ dysfunction. C. difficile colitis, EPEC 4/4 - diarrhea less frequent, now type 6 4/5 - diarrhea resolved - Continue Vancomycin PO x 10 day course (3 more days at d/c) - Completed azithromycin PO   Group A strep pharyngitis- neg PCR on admission. Unsure if completed amoxicillin.No abnormalities on CT neck - supportive care PRN   Hyponatremia- stable in low 130's Improving with PO intake and resolution of diarrhea.  S/p IV fluid --Monitor BMP   AKI- resolved.  Cr 1.40>>1.47>>1.16>>1.10 >>1.20 >> 1.20 --Monitor BMP at follow up     Elevated troponin No chest pain likely secondary to the patient's CKD troponin was 24 -> 19.  ECG normal sinus rhythm severely reviewed and interpreted with ventricular rate of 82 no ST changes QTc 422  Generalized weakness - due to above acute illnesses --PT/OT recommend SNF/rehab   Transaminitis- neg acute hepatitis panel. Fatty liver on [place]    Hypocalcemia --Corrected calcium 8.6 with albumin of 2.9 and calcium of 8.1  --supplement calcium   GERD - --Omeprazole    CVA --Aspirin and atorvastatin 40 mg   Mood disorder  --Sertraline 25 mg daily   Essential hypertension --Amlodipine and Coreg       Consultants: None  Procedures performed: None   Disposition: Skilled nursing facility  Diet recommendation:  Regular diet  DISCHARGE MEDICATION: Allergies as of 08/26/2023       Reactions   Metformin And Related  Diarrhea        Medication List     STOP taking these medications    amoxicillin 400 MG/5ML suspension Commonly known as: AMOXIL   calcium carbonate 600 MG tablet Commonly known as: OS-CAL   doxycycline 100 MG tablet Commonly known as: VIBRA-TABS       TAKE these medications    acetaminophen 500 MG tablet Commonly known as: TYLENOL Take 1,000 mg  by mouth every 6 (six) hours as needed (for pain.).   amLODipine 10 MG tablet Commonly known as: NORVASC Take 1 tablet by mouth once daily   aspirin EC 81 MG tablet Take 1 tablet (81 mg total) by mouth daily.   atorvastatin 40 MG tablet Commonly known as: LIPITOR TAKE 1 TABLET BY MOUTH ONCE DAILY AT 6PM What changed: See the new instructions.   blood glucose meter kit and supplies Kit Dispense based on patient and insurance preference. Use up to four times daily as directed. (FOR ICD-9 250.00, 250.01).   carvedilol 6.25 MG tablet Commonly known as: COREG TAKE 1 TABLET BY MOUTH TWICE DAILY WITH A MEAL   diclofenac Sodium 1 % Gel Commonly known as: VOLTAREN Apply 2 g topically 4 (four) times daily.   feeding supplement Liqd Take 237 mLs by mouth 2 (two) times daily between meals.   multivitamin with minerals Tabs tablet Take 1 tablet by mouth daily. Centrum Silver . In the morning   nystatin powder Commonly known as: MYCOSTATIN/NYSTOP Apply topically 2 (two) times daily.   omeprazole 20 MG capsule Commonly known as: PRILOSEC Take 1 capsule (20 mg total) by mouth daily. Please schedule an office visit for further refills. Thank you   oxybutynin 5 MG 24 hr tablet Commonly known as: DITROPAN-XL   Polyethyl Glycol-Propyl Glycol 0.4-0.3 % Soln Place 1 drop into both eyes 3 (three) times daily as needed (for dry eyes.).   sertraline 25 MG tablet Commonly known as: ZOLOFT Take 1 tablet by mouth once daily   Trulicity 0.75 MG/0.5ML Soaj Generic drug: Dulaglutide Inject 0.75 mg into the skin once a week.   vancomycin 125 MG capsule Commonly known as: VANCOCIN Take 1 capsule (125 mg total) by mouth 4 (four) times daily for 3 days.        Contact information for follow-up providers     Glori Luis, MD Follow up.   Specialty: Family Medicine Why: Hospital follow up Contact information: [address] [place] [address] [phone]               Contact information for after-discharge care     Destination     [facility] Preferred SNF .   Service: Skilled Nursing Contact information: [address] [place] [address] [phone]                    Discharge Exam: Ceasar Mons Weights   08/17/23 2032  Weight: 57.6 kg   General exam: awake, alert, no acute distress HEENT: atraumatic, clear conjunctiva, anicteric sclera, moist mucus membranes, hearing grossly normal  Respiratory system: CTA, no wheezes, rales or rhonchi, normal respiratory effort. Cardiovascular system: normal S1/S2, RRR, no JVD, murmurs, rubs, gallops, no pedal edema.   Gastrointestinal system: soft, NT, ND, no HSM felt, +bowel sounds. Central nervous system: A&O x 3. no gross focal neurologic deficits, normal speech Extremities: moves all, no edema, normal tone Skin: dry, intact, normal temperature Psychiatry: normal mood, congruent  affect, judgement and insight appear normal   Condition at discharge: stable  The results of significant diagnostics from this hospitalization (including imaging, microbiology, ancillary and laboratory) are listed below for reference.   Imaging Studies: CT SOFT TISSUE NECK WO CONTRAST Result Date: 08/17/2023 CLINICAL DATA:  Exclude Large Pharyngeal Collection/Abscess (Cr cannot give contrast) EXAM: CT NECK WITHOUT CONTRAST TECHNIQUE: Multidetector CT imaging of the neck was performed following the standard protocol without intravenous contrast. RADIATION DOSE REDUCTION: This exam was performed according to the departmental dose-optimization program which includes automated exposure control, adjustment of the mA and/or kV according to patient size and/or use of iterative reconstruction technique. COMPARISON:  None Available. FINDINGS: Pharynx and larynx: Normal. No mass or swelling. Salivary glands: No inflammation, mass,  or stone. Thyroid: Normal. Lymph nodes: None enlarged or abnormal density. Vascular: Nondiagnostic given absence of contrast. Limited intracranial: Negative. Visualized orbits: Negative. Mastoids and visualized paranasal sinuses: Clear. Skeleton: No acute abnormality but limited assessment. Upper chest: Visualized lung apices are clear. IMPRESSION: No evidence of acute abnormality.  No visible fluid collection. Electronically Signed   By: Feliberto Harts M.D.   On: 08/17/2023 20:58   US Abdomen Limited RUQ (LIVER/GB) Result Date: 08/17/2023 CLINICAL DATA:  Transaminitis EXAM: ULTRASOUND ABDOMEN LIMITED RIGHT UPPER QUADRANT COMPARISON:  None Available. FINDINGS: Gallbladder: Cholecystectomy.  No sonographic Murphy sign noted by sonographer. Common bile duct: Diameter: 7 mm.  No intrahepatic biliary dilation. Liver: No focal lesion identified. Increased parenchymal echogenicity. Portal vein is patent on color Doppler imaging with normal direction of blood flow towards the liver. Other: None. IMPRESSION: Hepatic steatosis.  No biliary dilation. Electronically Signed   By: Minerva Fester M.D.   On: 08/17/2023 20:51   DG Chest 2 View Result Date: 08/17/2023 CLINICAL DATA:  Fever. EXAM: CHEST - 2 VIEW COMPARISON:  December 10, 2022. FINDINGS: The heart size and mediastinal contours are within normal limits. Both lungs are clear. The visualized skeletal structures are unremarkable. IMPRESSION: No active cardiopulmonary disease. Electronically Signed   By: Lupita Raider M.D.   On: 08/17/2023 16:41    Microbiology: Results for orders placed or performed during the hospital encounter of 08/17/23  Culture, blood (Routine X 2) w Reflex to ID Panel     Status: None   Collection Time: 08/17/23  3:38 PM   Specimen: BLOOD  Result Value Ref Range Status   Specimen Description BLOOD BLOOD LEFT HAND  Final   Special Requests   Final    BOTTLES DRAWN AEROBIC AND ANAEROBIC Blood Culture adequate volume   Culture    Final    NO GROWTH 5 DAYS Performed at [facility], [address] [place], [place] [address]    Report Status 08/22/2023 FINAL  Final  Culture, blood (Routine X 2) w Reflex to ID Panel     Status: None   Collection Time: 08/17/23  4:48 PM   Specimen: BLOOD  Result Value Ref Range Status   Specimen Description BLOOD BLOOD LEFT WRIST  Final   Special Requests   Final    BOTTLES DRAWN AEROBIC AND ANAEROBIC Blood Culture results may not be optimal due to an inadequate volume of blood received in culture bottles   Culture   Final    NO GROWTH 5 DAYS Performed at [facility], [address] [place], [place] [address]    Report Status 08/22/2023 FINAL  Final  Resp panel by RT-PCR (RSV, Flu A&B, Covid) Anterior Nasal Swab  Status: None   Collection Time: 08/17/23  5:38 PM   Specimen: Anterior Nasal Swab  Result Value Ref Range Status   SARS Coronavirus 2 by RT PCR NEGATIVE NEGATIVE Final    Comment: (NOTE) SARS-CoV-2 target nucleic acids are NOT DETECTED.  The SARS-CoV-2 RNA is generally detectable in upper respiratory specimens during the acute phase of infection. The lowest concentration of SARS-CoV-2 viral copies this assay can detect is 138 copies/mL. A negative result does not preclude SARS-Cov-2 infection and should not be used as the sole basis for treatment or other patient management decisions. A negative result may occur with  improper specimen collection/handling, submission of specimen other than nasopharyngeal swab, presence of viral mutation(s) within the areas targeted by this assay, and inadequate number of viral copies(<138 copies/mL). A negative result must be combined with clinical observations, patient history, and epidemiological information. The expected result is Negative.  Fact Sheet for Patients:  BloggerCourse.com  Fact Sheet for Healthcare Providers:  SeriousBroker.it  This test  is no t yet approved or cleared by the [place] FDA and  has been authorized for detection and/or diagnosis of SARS-CoV-2 by FDA under an Emergency Use Authorization (EUA). This EUA will remain  in effect (meaning this test can be used) for the duration of the COVID-19 declaration under Section 564(b)(1) of the Act, 21 U.S.C.section 360bbb-3(b)(1), unless the authorization is terminated  or revoked sooner.       Influenza A by PCR NEGATIVE NEGATIVE Final   Influenza B by PCR NEGATIVE NEGATIVE Final    Comment: (NOTE) The Xpert Xpress SARS-CoV-2/FLU/RSV plus assay is intended as an aid in the diagnosis of influenza from Nasopharyngeal swab specimens and should not be used as a sole basis for treatment. Nasal washings and aspirates are unacceptable for Xpert Xpress SARS-CoV-2/FLU/RSV testing.  Fact Sheet for Patients: BloggerCourse.com  Fact Sheet for Healthcare Providers: SeriousBroker.it  This test is not yet approved or cleared by the [place] FDA and has been authorized for detection and/or diagnosis of SARS-CoV-2 by FDA under an Emergency Use Authorization (EUA). This EUA will remain in effect (meaning this test can be used) for the duration of the COVID-19 declaration under Section 564(b)(1) of the Act, 21 U.S.C. section 360bbb-3(b)(1), unless the authorization is terminated or revoked.     Resp Syncytial Virus by PCR NEGATIVE NEGATIVE Final    Comment: (NOTE) Fact Sheet for Patients: BloggerCourse.com  Fact Sheet for Healthcare Providers: SeriousBroker.it  This test is not yet approved or cleared by the [place] FDA and has been authorized for detection and/or diagnosis of SARS-CoV-2 by FDA under an Emergency Use Authorization (EUA). This EUA will remain in effect (meaning this test can be used) for the duration of the COVID-19 declaration under Section  564(b)(1) of the Act, 21 U.S.C. section 360bbb-3(b)(1), unless the authorization is terminated or revoked.  Performed at [facility], [address] [place], [place] [address]   Group A Strep by PCR     Status: None   Collection Time: 08/17/23  9:13 PM   Specimen: Throat; Sterile Swab  Result Value Ref Range Status   Group A Strep by PCR NOT DETECTED NOT DETECTED Final    Comment: Performed at [facility], [address] [place], [place] [address]  Gastrointestinal Panel by PCR , Stool     Status: Abnormal   Collection Time: 08/18/23  6:23 PM   Specimen: Stool  Result Value Ref Range Status   Campylobacter species NOT  DETECTED NOT DETECTED Final   Plesimonas shigelloides NOT DETECTED NOT DETECTED Final   Salmonella species NOT DETECTED NOT DETECTED Final   Yersinia enterocolitica NOT DETECTED NOT DETECTED Final   Vibrio species NOT DETECTED NOT DETECTED Final   Vibrio cholerae NOT DETECTED NOT DETECTED Final   Enteroaggregative E coli (EAEC) NOT DETECTED NOT DETECTED Final   Enteropathogenic E coli (EPEC) DETECTED (A) NOT DETECTED Final    Comment: RESULT CALLED TO, READ BACK BY AND VERIFIED WITH: Human resources officer RN ED @ 2016 08/18/23 LFD    Enterotoxigenic E coli (ETEC) NOT DETECTED NOT DETECTED Final   Shiga like toxin producing E coli (STEC) NOT DETECTED NOT DETECTED Final   Shigella/Enteroinvasive E coli (EIEC) NOT DETECTED NOT DETECTED Final   Cryptosporidium NOT DETECTED NOT DETECTED Final   Cyclospora cayetanensis NOT DETECTED NOT DETECTED Final   Entamoeba histolytica NOT DETECTED NOT DETECTED Final   Giardia lamblia NOT DETECTED NOT DETECTED Final   Adenovirus F40/41 NOT DETECTED NOT DETECTED Final   Astrovirus NOT DETECTED NOT DETECTED Final   Norovirus GI/GII NOT DETECTED NOT DETECTED Final   Rotavirus A NOT DETECTED NOT DETECTED Final   Sapovirus (I, II, IV, and V) NOT DETECTED NOT DETECTED Final    Comment: Performed at [facility],  [address] [place], [place] [address]  C Difficile Quick Screen w PCR reflex     Status: Abnormal   Collection Time: 08/18/23  6:23 PM   Specimen: Stool  Result Value Ref Range Status   C Diff antigen POSITIVE (A) NEGATIVE Final   C Diff toxin POSITIVE (A) NEGATIVE Final   C Diff interpretation Toxin producing C. difficile detected.  Final    Comment: CRITICAL RESULT CALLED TO, READ BACK BY AND VERIFIED WITH: Ferne Reus RN ED @ 1955 08/18/23 LFD Performed at [facility], [address] [place], [place] [address]     Labs: CBC: Recent Labs  Lab 08/20/23 0546 08/22/23 0430 08/24/23 0405  WBC 11.4* 7.4 6.3  HGB 10.9* 9.4* 9.6*  HCT 30.8* 26.6* 27.2*  MCV 89.3 88.4 86.9  PLT 254 284 338   Basic Metabolic Panel: Recent Labs  Lab 08/20/23 0546 08/22/23 0430 08/23/23 0608 08/24/23 0405  NA 132* 132* 132* 131*  K 4.1 4.1 4.5 4.3  CL 102 103 103 103  CO2 20* 20* 23 20*  GLUCOSE 129* 127* 106* 112*  BUN 22 21 24* 25*  CREATININE 1.16* 1.10* 1.20* 1.20*  CALCIUM 8.3* 8.2* 8.3* 8.4*  MG  --  1.9  --   --   PHOS  --  3.4  --   --    Liver Function Tests: Recent Labs  Lab 08/23/23 0608  AST 56*  ALT 69*  ALKPHOS 87  BILITOT <0.2  PROT 6.1*  ALBUMIN 2.4*   CBG: No results for input(s): "GLUCAP" in the last 168 hours.  Discharge time spent: greater than 30 minutes.  Signed: Pennie Banter, DO [facility] 08/26/2023

## 2023-08-26 NOTE — Progress Notes (Signed)
 Report called to nursing staff at [facility]. All belongings sent with patient. Transported via daughters vehicle.

## 2023-08-29 ENCOUNTER — Other Ambulatory Visit: Payer: Self-pay | Admitting: Physician Assistant

## 2023-08-30 DIAGNOSIS — A0472 Enterocolitis due to Clostridium difficile, not specified as recurrent: Secondary | ICD-10-CM | POA: Diagnosis not present

## 2023-08-30 DIAGNOSIS — E871 Hypo-osmolality and hyponatremia: Secondary | ICD-10-CM | POA: Diagnosis not present

## 2023-08-30 DIAGNOSIS — E1122 Type 2 diabetes mellitus with diabetic chronic kidney disease: Secondary | ICD-10-CM | POA: Diagnosis not present

## 2023-08-30 DIAGNOSIS — I129 Hypertensive chronic kidney disease with stage 1 through stage 4 chronic kidney disease, or unspecified chronic kidney disease: Secondary | ICD-10-CM | POA: Diagnosis not present

## 2023-08-30 DIAGNOSIS — F32A Depression, unspecified: Secondary | ICD-10-CM | POA: Diagnosis not present

## 2023-08-30 DIAGNOSIS — K219 Gastro-esophageal reflux disease without esophagitis: Secondary | ICD-10-CM | POA: Diagnosis not present

## 2023-08-30 DIAGNOSIS — Z8673 Personal history of transient ischemic attack (TIA), and cerebral infarction without residual deficits: Secondary | ICD-10-CM | POA: Diagnosis not present

## 2023-09-02 DIAGNOSIS — F32A Depression, unspecified: Secondary | ICD-10-CM | POA: Diagnosis not present

## 2023-09-02 DIAGNOSIS — I129 Hypertensive chronic kidney disease with stage 1 through stage 4 chronic kidney disease, or unspecified chronic kidney disease: Secondary | ICD-10-CM | POA: Diagnosis not present

## 2023-09-02 DIAGNOSIS — E1122 Type 2 diabetes mellitus with diabetic chronic kidney disease: Secondary | ICD-10-CM | POA: Diagnosis not present

## 2023-09-02 DIAGNOSIS — K219 Gastro-esophageal reflux disease without esophagitis: Secondary | ICD-10-CM | POA: Diagnosis not present

## 2023-09-02 DIAGNOSIS — E871 Hypo-osmolality and hyponatremia: Secondary | ICD-10-CM | POA: Diagnosis not present

## 2023-09-02 DIAGNOSIS — Z8673 Personal history of transient ischemic attack (TIA), and cerebral infarction without residual deficits: Secondary | ICD-10-CM | POA: Diagnosis not present

## 2023-09-02 DIAGNOSIS — A0472 Enterocolitis due to Clostridium difficile, not specified as recurrent: Secondary | ICD-10-CM | POA: Diagnosis not present

## 2023-09-03 ENCOUNTER — Ambulatory Visit (INDEPENDENT_AMBULATORY_CARE_PROVIDER_SITE_OTHER): Payer: Medicare HMO | Admitting: Nurse Practitioner

## 2023-09-03 ENCOUNTER — Encounter (INDEPENDENT_AMBULATORY_CARE_PROVIDER_SITE_OTHER): Payer: Medicare HMO

## 2023-09-03 DIAGNOSIS — R6520 Severe sepsis without septic shock: Secondary | ICD-10-CM | POA: Diagnosis not present

## 2023-09-03 DIAGNOSIS — E871 Hypo-osmolality and hyponatremia: Secondary | ICD-10-CM | POA: Diagnosis not present

## 2023-09-03 DIAGNOSIS — K219 Gastro-esophageal reflux disease without esophagitis: Secondary | ICD-10-CM | POA: Diagnosis not present

## 2023-09-03 DIAGNOSIS — F324 Major depressive disorder, single episode, in partial remission: Secondary | ICD-10-CM | POA: Diagnosis not present

## 2023-09-03 DIAGNOSIS — A0472 Enterocolitis due to Clostridium difficile, not specified as recurrent: Secondary | ICD-10-CM | POA: Diagnosis not present

## 2023-09-03 DIAGNOSIS — I129 Hypertensive chronic kidney disease with stage 1 through stage 4 chronic kidney disease, or unspecified chronic kidney disease: Secondary | ICD-10-CM | POA: Diagnosis not present

## 2023-09-03 DIAGNOSIS — N183 Chronic kidney disease, stage 3 unspecified: Secondary | ICD-10-CM | POA: Diagnosis not present

## 2023-09-03 DIAGNOSIS — E1122 Type 2 diabetes mellitus with diabetic chronic kidney disease: Secondary | ICD-10-CM | POA: Diagnosis not present

## 2023-09-03 DIAGNOSIS — Z8673 Personal history of transient ischemic attack (TIA), and cerebral infarction without residual deficits: Secondary | ICD-10-CM | POA: Diagnosis not present

## 2023-09-03 DIAGNOSIS — N3281 Overactive bladder: Secondary | ICD-10-CM | POA: Diagnosis not present

## 2023-09-04 ENCOUNTER — Encounter

## 2023-09-04 DIAGNOSIS — E782 Mixed hyperlipidemia: Secondary | ICD-10-CM

## 2023-09-04 DIAGNOSIS — I129 Hypertensive chronic kidney disease with stage 1 through stage 4 chronic kidney disease, or unspecified chronic kidney disease: Secondary | ICD-10-CM | POA: Diagnosis not present

## 2023-09-04 DIAGNOSIS — K219 Gastro-esophageal reflux disease without esophagitis: Secondary | ICD-10-CM | POA: Diagnosis not present

## 2023-09-04 DIAGNOSIS — E1122 Type 2 diabetes mellitus with diabetic chronic kidney disease: Secondary | ICD-10-CM | POA: Diagnosis not present

## 2023-09-04 DIAGNOSIS — E871 Hypo-osmolality and hyponatremia: Secondary | ICD-10-CM | POA: Diagnosis not present

## 2023-09-04 DIAGNOSIS — F32A Depression, unspecified: Secondary | ICD-10-CM | POA: Diagnosis not present

## 2023-09-04 DIAGNOSIS — N3281 Overactive bladder: Secondary | ICD-10-CM | POA: Diagnosis not present

## 2023-09-04 DIAGNOSIS — Z8673 Personal history of transient ischemic attack (TIA), and cerebral infarction without residual deficits: Secondary | ICD-10-CM | POA: Diagnosis not present

## 2023-09-04 DIAGNOSIS — F330 Major depressive disorder, recurrent, mild: Secondary | ICD-10-CM

## 2023-09-04 DIAGNOSIS — A0472 Enterocolitis due to Clostridium difficile, not specified as recurrent: Secondary | ICD-10-CM | POA: Diagnosis not present

## 2023-09-04 DIAGNOSIS — I10 Essential (primary) hypertension: Secondary | ICD-10-CM

## 2023-09-04 DIAGNOSIS — N183 Chronic kidney disease, stage 3 unspecified: Secondary | ICD-10-CM | POA: Diagnosis not present

## 2023-09-06 DIAGNOSIS — K219 Gastro-esophageal reflux disease without esophagitis: Secondary | ICD-10-CM | POA: Diagnosis not present

## 2023-09-06 DIAGNOSIS — E1122 Type 2 diabetes mellitus with diabetic chronic kidney disease: Secondary | ICD-10-CM | POA: Diagnosis not present

## 2023-09-06 DIAGNOSIS — I129 Hypertensive chronic kidney disease with stage 1 through stage 4 chronic kidney disease, or unspecified chronic kidney disease: Secondary | ICD-10-CM | POA: Diagnosis not present

## 2023-09-06 DIAGNOSIS — Z8673 Personal history of transient ischemic attack (TIA), and cerebral infarction without residual deficits: Secondary | ICD-10-CM | POA: Diagnosis not present

## 2023-09-06 DIAGNOSIS — E871 Hypo-osmolality and hyponatremia: Secondary | ICD-10-CM | POA: Diagnosis not present

## 2023-09-06 DIAGNOSIS — A0472 Enterocolitis due to Clostridium difficile, not specified as recurrent: Secondary | ICD-10-CM | POA: Diagnosis not present

## 2023-09-06 DIAGNOSIS — N183 Chronic kidney disease, stage 3 unspecified: Secondary | ICD-10-CM | POA: Diagnosis not present

## 2023-09-06 DIAGNOSIS — N3281 Overactive bladder: Secondary | ICD-10-CM | POA: Diagnosis not present

## 2023-09-06 DIAGNOSIS — R600 Localized edema: Secondary | ICD-10-CM | POA: Diagnosis not present

## 2023-09-09 DIAGNOSIS — A0472 Enterocolitis due to Clostridium difficile, not specified as recurrent: Secondary | ICD-10-CM | POA: Diagnosis not present

## 2023-09-09 DIAGNOSIS — N3281 Overactive bladder: Secondary | ICD-10-CM | POA: Diagnosis not present

## 2023-09-09 DIAGNOSIS — E871 Hypo-osmolality and hyponatremia: Secondary | ICD-10-CM | POA: Diagnosis not present

## 2023-09-09 DIAGNOSIS — K219 Gastro-esophageal reflux disease without esophagitis: Secondary | ICD-10-CM | POA: Diagnosis not present

## 2023-09-09 DIAGNOSIS — E1122 Type 2 diabetes mellitus with diabetic chronic kidney disease: Secondary | ICD-10-CM | POA: Diagnosis not present

## 2023-09-09 DIAGNOSIS — I129 Hypertensive chronic kidney disease with stage 1 through stage 4 chronic kidney disease, or unspecified chronic kidney disease: Secondary | ICD-10-CM | POA: Diagnosis not present

## 2023-09-09 DIAGNOSIS — N183 Chronic kidney disease, stage 3 unspecified: Secondary | ICD-10-CM | POA: Diagnosis not present

## 2023-09-11 DIAGNOSIS — E871 Hypo-osmolality and hyponatremia: Secondary | ICD-10-CM | POA: Diagnosis not present

## 2023-09-11 DIAGNOSIS — I129 Hypertensive chronic kidney disease with stage 1 through stage 4 chronic kidney disease, or unspecified chronic kidney disease: Secondary | ICD-10-CM | POA: Diagnosis not present

## 2023-09-11 DIAGNOSIS — E1122 Type 2 diabetes mellitus with diabetic chronic kidney disease: Secondary | ICD-10-CM | POA: Diagnosis not present

## 2023-09-11 DIAGNOSIS — Z8673 Personal history of transient ischemic attack (TIA), and cerebral infarction without residual deficits: Secondary | ICD-10-CM | POA: Diagnosis not present

## 2023-09-11 DIAGNOSIS — N3281 Overactive bladder: Secondary | ICD-10-CM | POA: Diagnosis not present

## 2023-09-11 DIAGNOSIS — A0472 Enterocolitis due to Clostridium difficile, not specified as recurrent: Secondary | ICD-10-CM | POA: Diagnosis not present

## 2023-09-11 DIAGNOSIS — K219 Gastro-esophageal reflux disease without esophagitis: Secondary | ICD-10-CM | POA: Diagnosis not present

## 2023-09-11 DIAGNOSIS — N183 Chronic kidney disease, stage 3 unspecified: Secondary | ICD-10-CM | POA: Diagnosis not present

## 2023-09-11 DIAGNOSIS — F32A Depression, unspecified: Secondary | ICD-10-CM | POA: Diagnosis not present

## 2023-09-11 DIAGNOSIS — R7989 Other specified abnormal findings of blood chemistry: Secondary | ICD-10-CM | POA: Diagnosis not present

## 2023-09-16 ENCOUNTER — Ambulatory Visit (INDEPENDENT_AMBULATORY_CARE_PROVIDER_SITE_OTHER): Admitting: Internal Medicine

## 2023-09-16 ENCOUNTER — Encounter: Payer: Self-pay | Admitting: Internal Medicine

## 2023-09-16 VITALS — BP 118/52 | HR 79 | Temp 98.6°F | Ht 60.0 in | Wt 138.6 lb

## 2023-09-16 DIAGNOSIS — R7401 Elevation of levels of liver transaminase levels: Secondary | ICD-10-CM | POA: Diagnosis not present

## 2023-09-16 DIAGNOSIS — E871 Hypo-osmolality and hyponatremia: Secondary | ICD-10-CM

## 2023-09-16 DIAGNOSIS — D649 Anemia, unspecified: Secondary | ICD-10-CM | POA: Diagnosis not present

## 2023-09-16 DIAGNOSIS — N179 Acute kidney failure, unspecified: Secondary | ICD-10-CM

## 2023-09-16 DIAGNOSIS — I10 Essential (primary) hypertension: Secondary | ICD-10-CM

## 2023-09-16 DIAGNOSIS — A0472 Enterocolitis due to Clostridium difficile, not specified as recurrent: Secondary | ICD-10-CM

## 2023-09-16 DIAGNOSIS — Z8619 Personal history of other infectious and parasitic diseases: Secondary | ICD-10-CM

## 2023-09-16 NOTE — Assessment & Plan Note (Signed)
-   This problem is chronic and stable -Patient's medications (amlodipine  and Coreg ) were initially held on admission to the hospital secondary to her underlying infection -This was resumed on discharge -Today her blood pressure is 120/54 -Will continue with her Coreg  and amlodipine  for now -No further workup at this time

## 2023-09-16 NOTE — Assessment & Plan Note (Signed)
-   Patient history of normocytic anemia likely secondary to CKD -She was noted to have worsening anemia with hemoglobin in the nines (from baseline 10-11) in the hospital -Will repeat her CBC today -Will also check iron, IBC as well as vitamin B12 level -Patient has a follow-up with her nephrologist coming up -No further workup at this time

## 2023-09-16 NOTE — Progress Notes (Signed)
 Established Patient Office Visit  Subjective   Patient ID: Crystal Miller, female    DOB: [birth year]  Age: 78 y.o. MRN: [id number]  Chief Complaint  Patient presents with   Hospitalization Follow-up    HPI  Patient was recently admitted to [facility] from March 29 to April 7 with a C. difficile infection.  Patient initially developed a sore throat and was treated with amoxicillin  which resolved her symptoms but then she developed significant diarrhea associated with fevers and chills as well as anorexia and was found to have C. difficile infection at [facility].  Patient was treated with oral vancomycin  for her C. difficile and azithromycin  for possible strep throat.  Patient's diarrhea improved while she was in the hospital and she was discharged to SNF on April 7.  Patient returned home from SNF on April 23.  Currently, patient states that her symptoms have resolved.  She has had no further episodes of diarrhea and her sore throat has resolved as well.  She states that she feels like she is back at her baseline and denies any new complaints currently.  Patient's medication list was reconciled at this visit as well.    Review of Systems  Constitutional: Negative.   HENT: Negative.    Respiratory: Negative.    Cardiovascular: Negative.   Gastrointestinal: Negative.  Negative for abdominal pain, diarrhea, nausea and vomiting.  Neurological: Negative.  Negative for dizziness and loss of consciousness.  Psychiatric/Behavioral: Negative.        Objective:     BP (!) 118/52   Pulse 79   Temp 98.6 F (37 C) (Oral)   Ht 5' (1.524 m)   Wt 138 lb 9.6 oz (62.9 kg)   SpO2 97%   BMI 27.07 kg/m    Physical Exam Constitutional:      Appearance: Normal appearance.  HENT:     Head: Normocephalic and atraumatic.  Cardiovascular:     Rate and Rhythm: Normal rate and regular rhythm.     Heart sounds: Normal heart sounds.  Pulmonary:     Effort: Pulmonary effort is normal. No respiratory  distress.     Breath sounds: Normal breath sounds. No wheezing or rales.  Abdominal:     General: Bowel sounds are normal. There is no distension.     Palpations: Abdomen is soft.     Tenderness: There is no abdominal tenderness. There is no guarding or rebound.  Musculoskeletal:        General: Swelling present. No tenderness.     Comments: Trace bilateral lower extremity edema noted  Neurological:     Mental Status: Crystal Miller is alert.  Psychiatric:        Mood and Affect: Mood normal.        Behavior: Behavior normal.      No results found for any visits on 09/16/23.    The ASCVD Risk score (Arnett DK, et al., 2019) failed to calculate for the following reasons:   Risk score cannot be calculated because patient has a medical history suggesting prior/existing ASCVD    Assessment & Plan:   Problem List Items Addressed This Visit       Cardiovascular and Mediastinum   Essential hypertension (Chronic)   - This problem is chronic and stable -Patient's medications (amlodipine  and Coreg ) were initially held on admission to the hospital secondary to her underlying infection -This was resumed on discharge -Today her blood pressure is 120/54 -Will continue with her Coreg  and amlodipine  for now -No further  workup at this time        Digestive   C. difficile colitis   - Patient was admitted to [facility] recently from March 29 to April 7 with a C. difficile infection.  Patient subsequently was discharged to SNF after improvement of her symptoms on April 7 and returned home on April 23. -Patient was treated with oral vancomycin  for 10 days with resolution of her symptoms -Patient denies any diarrhea currently and is tolerating oral intake -She completed her course of antibiotics -She denies any acute complaints at this time -Return precautions given to the patient (recurrent diarrhea, fever/chills, abdominal pain or anorexia) -No further workup at this time        Genitourinary    AKI (acute kidney injury) (HCC) - Primary   - Patient has a history of CKD with a baseline creatinine of 1.4-1.6 -She was noted to have an AKI on admission to the hospital secondary to her C. difficile infection and decreased oral intake -She is also noted to have mild hyponatremia likely secondary to decreased oral intake -She received IV fluids and her C. difficile was treated and her oral intake improved -Her creatinine improved to 1.2 on discharge -Will repeat a BMP today for follow-up - Patient to follow-up with her nephrologist on May 5      Relevant Orders   Comprehensive metabolic panel with GFR     Other   Hyponatremia   - Patient was noted to be mildly hyponatremic in the hospital likely secondary to decreased oral intake and underlying infection -Will repeat her BMP today given that her symptoms have resolved -No further workup at this time      Normocytic anemia   - Patient history of normocytic anemia likely secondary to CKD -She was noted to have worsening anemia with hemoglobin in the nines (from baseline 10-11) in the hospital -Will repeat her CBC today -Will also check iron, IBC as well as vitamin B12 level -Patient has a follow-up with her nephrologist coming up -No further workup at this time      Relevant Orders   CBC with Differential/Platelet   IBC + Ferritin   Vitamin B12   Transaminitis   - Patient was noted to have mild transaminitis on admission to the hospital for C. Difficile -I suspect this likely secondary to her underlying infection -She had an abdominal ultrasound done which showed fatty liver -Will recheck her LFTs today -No further workup at this time      Relevant Orders   Comprehensive metabolic panel with GFR    No follow-ups on file.    Nuh Lipton, MD

## 2023-09-16 NOTE — Assessment & Plan Note (Signed)
-   Patient was noted to have mild transaminitis on admission to the hospital for C. Difficile -I suspect this likely secondary to her underlying infection -She had an abdominal ultrasound done which showed fatty liver -Will recheck her LFTs today -No further workup at this time

## 2023-09-16 NOTE — Assessment & Plan Note (Addendum)
-   Patient has a history of CKD with a baseline creatinine of 1.4-1.6 -She was noted to have an AKI on admission to the hospital secondary to her C. difficile infection and decreased oral intake -She is also noted to have mild hyponatremia likely secondary to decreased oral intake -She received IV fluids and her C. difficile was treated and her oral intake improved -Her creatinine improved to 1.2 on discharge -Will repeat a BMP today for follow-up - Patient to follow-up with her nephrologist on May 5

## 2023-09-16 NOTE — Patient Instructions (Signed)
-   It was a pleasure meeting you today -We reviewed your medication list today -Please follow-up with your nephrologist for further evaluation of your kidney function -We will check some blood work today including your kidney function, liver function as well as your blood counts -Please follow-up with your new primary care physician at her next available appointment -Please contact us  with any questions or concerns

## 2023-09-16 NOTE — Assessment & Plan Note (Signed)
-   Patient was noted to be mildly hyponatremic in the hospital likely secondary to decreased oral intake and underlying infection -Will repeat her BMP today given that her symptoms have resolved -No further workup at this time

## 2023-09-16 NOTE — Assessment & Plan Note (Signed)
-   Patient was admitted to [facility] recently from March 29 to April 7 with a C. difficile infection.  Patient subsequently was discharged to SNF after improvement of her symptoms on April 7 and returned home on April 23. -Patient was treated with oral vancomycin  for 10 days with resolution of her symptoms -Patient denies any diarrhea currently and is tolerating oral intake -She completed her course of antibiotics -She denies any acute complaints at this time -Return precautions given to the patient (recurrent diarrhea, fever/chills, abdominal pain or anorexia) -No further workup at this time

## 2023-09-17 LAB — CBC WITH DIFFERENTIAL/PLATELET
Basophils Absolute: 0.1 10*3/uL (ref 0.0–0.1)
Basophils Relative: 0.7 % (ref 0.0–3.0)
Eosinophils Absolute: 0.2 10*3/uL (ref 0.0–0.7)
Eosinophils Relative: 1.6 % (ref 0.0–5.0)
HCT: 32.2 % — ABNORMAL LOW (ref 36.0–46.0)
Hemoglobin: 10.7 g/dL — ABNORMAL LOW (ref 12.0–15.0)
Lymphocytes Relative: 16.6 % (ref 12.0–46.0)
Lymphs Abs: 1.7 10*3/uL (ref 0.7–4.0)
MCHC: 33.3 g/dL (ref 30.0–36.0)
MCV: 91.9 fl (ref 78.0–100.0)
Monocytes Absolute: 0.7 10*3/uL (ref 0.1–1.0)
Monocytes Relative: 7.2 % (ref 3.0–12.0)
Neutro Abs: 7.7 10*3/uL (ref 1.4–7.7)
Neutrophils Relative %: 73.9 % (ref 43.0–77.0)
Platelets: 356 10*3/uL (ref 150.0–400.0)
RBC: 3.51 Mil/uL — ABNORMAL LOW (ref 3.87–5.11)
RDW: 13.7 % (ref 11.5–15.5)
WBC: 10.4 10*3/uL (ref 4.0–10.5)

## 2023-09-17 LAB — COMPREHENSIVE METABOLIC PANEL WITH GFR
ALT: 16 U/L (ref 0–35)
AST: 18 U/L (ref 0–37)
Albumin: 3.8 g/dL (ref 3.5–5.2)
Alkaline Phosphatase: 88 U/L (ref 39–117)
BUN: 35 mg/dL — ABNORMAL HIGH (ref 6–23)
CO2: 23 meq/L (ref 19–32)
Calcium: 9.4 mg/dL (ref 8.4–10.5)
Chloride: 107 meq/L (ref 96–112)
Creatinine, Ser: 1.57 mg/dL — ABNORMAL HIGH (ref 0.40–1.20)
GFR: 31.26 mL/min — ABNORMAL LOW (ref >60.00)
Glucose, Bld: 92 mg/dL (ref 70–99)
Potassium: 5 meq/L (ref 3.5–5.1)
Sodium: 136 meq/L (ref 135–145)
Total Bilirubin: 0.4 mg/dL (ref 0.2–1.2)
Total Protein: 7.4 g/dL (ref 6.0–8.3)

## 2023-09-17 LAB — IBC + FERRITIN
Ferritin: 47.8 ng/mL (ref 10.0–291.0)
Iron: 80 ug/dL (ref 42–145)
Saturation Ratios: 25.7 % (ref 20.0–50.0)
TIBC: 310.8 ug/dL (ref 250.0–450.0)
Transferrin: 222 mg/dL (ref 212.0–360.0)

## 2023-09-23 ENCOUNTER — Other Ambulatory Visit: Payer: Self-pay | Admitting: Gastroenterology

## 2023-09-24 ENCOUNTER — Telehealth: Payer: Self-pay

## 2023-09-24 NOTE — Telephone Encounter (Signed)
 Copied from CRM [id number]. Topic: Clinical - Home Health Verbal Orders >> Sep 24, 2023  1:30 PM [place] wrote: Reason for CRM: [facility] # [phone]- looking to get orders for PT and OT. Patient does not have a transfer of care appt until September

## 2023-09-25 ENCOUNTER — Telehealth: Payer: Self-pay | Admitting: Internal Medicine

## 2023-09-25 ENCOUNTER — Telehealth: Payer: Self-pay

## 2023-09-25 DIAGNOSIS — A0472 Enterocolitis due to Clostridium difficile, not specified as recurrent: Secondary | ICD-10-CM

## 2023-09-25 NOTE — Telephone Encounter (Signed)
 Sent to Dr. Sharia Daunt since they are requesting that he sign off since he was the one that saw the Patient last.

## 2023-09-25 NOTE — Telephone Encounter (Signed)
 Copied from CRM [id number]. Topic: Clinical - Home Health Verbal Orders >> Sep 25, 2023  9:45 AM Lovett Ruck C wrote: Caller/Agency: Mylinda Asa with [facility] Callback Number: [phone] (secure voicemail) Service Requested: Physical Therapy, Occupational Therapy Frequency: initial evaluations  Any new concerns about the patient? No- however note regarding patient as Dr. Sharia Daunt saw patient most recently Home Health is asking for him to cover Home Health visit requests including this one until patient has her primary care visit in September. Patient previously saw Dr. Lovetta Rucks and saw Dr. Sharia Daunt on 4/28. Thank you

## 2023-09-25 NOTE — Addendum Note (Signed)
 Addended by: Sacheen Arrasmith on: 09/25/2023 04:37 PM   Modules accepted: Orders

## 2023-09-25 NOTE — Telephone Encounter (Signed)
 Called Mandy at [facility]: Lambert Rhonda W and let her know that Dr. Sharia Daunt put the orders in.

## 2023-09-25 NOTE — Telephone Encounter (Signed)
 [facility] called again today in regards to concerns for a provider to write PT and OT orders for this pt. We let them know we would send a note back after reviewing CRM from yesterday. [facility]

## 2023-09-25 NOTE — Telephone Encounter (Signed)
 Noted.

## 2023-09-26 ENCOUNTER — Encounter: Payer: Self-pay | Admitting: Pharmacist

## 2023-09-26 ENCOUNTER — Other Ambulatory Visit: Payer: Self-pay | Admitting: Family

## 2023-09-26 DIAGNOSIS — E782 Mixed hyperlipidemia: Secondary | ICD-10-CM

## 2023-09-26 MED ORDER — ATORVASTATIN CALCIUM 40 MG PO TABS: ORAL_TABLET | ORAL | 1 refills | Status: DC

## 2023-09-26 NOTE — Progress Notes (Signed)
 Reviewed chart Refilled lipitor until TOC with Dr Casimir Cleaver

## 2023-09-26 NOTE — Progress Notes (Signed)
 Pharmacy Quality Measure Review  This patient is appearing on a report for being at risk of failing the adherence measure for cholesterol (statin) medications this calendar year.   Medication: atorvastatin  40 mg Last fill date: 05/23/23 for 90 day supply  **Last Rx written December 2024 for 90 ds with 0 refills.  Previous prescriber no longer at this practice.  Patient is scheduled for transitions of care visit with new PCP in September.  Significant CAD history, on statin for secondary prevention.  Message sent to new PCP regarding consideration of refill

## 2023-09-30 DIAGNOSIS — K219 Gastro-esophageal reflux disease without esophagitis: Secondary | ICD-10-CM | POA: Diagnosis not present

## 2023-09-30 DIAGNOSIS — F32A Depression, unspecified: Secondary | ICD-10-CM | POA: Diagnosis not present

## 2023-09-30 DIAGNOSIS — E875 Hyperkalemia: Secondary | ICD-10-CM | POA: Diagnosis not present

## 2023-09-30 DIAGNOSIS — A0472 Enterocolitis due to Clostridium difficile, not specified as recurrent: Secondary | ICD-10-CM | POA: Diagnosis not present

## 2023-09-30 DIAGNOSIS — G939 Disorder of brain, unspecified: Secondary | ICD-10-CM | POA: Diagnosis not present

## 2023-09-30 DIAGNOSIS — N3281 Overactive bladder: Secondary | ICD-10-CM | POA: Diagnosis not present

## 2023-09-30 DIAGNOSIS — N183 Chronic kidney disease, stage 3 unspecified: Secondary | ICD-10-CM | POA: Diagnosis not present

## 2023-09-30 DIAGNOSIS — I10 Essential (primary) hypertension: Secondary | ICD-10-CM | POA: Diagnosis not present

## 2023-09-30 DIAGNOSIS — N1832 Chronic kidney disease, stage 3b: Secondary | ICD-10-CM | POA: Diagnosis not present

## 2023-09-30 DIAGNOSIS — N2581 Secondary hyperparathyroidism of renal origin: Secondary | ICD-10-CM | POA: Diagnosis not present

## 2023-09-30 DIAGNOSIS — J020 Streptococcal pharyngitis: Secondary | ICD-10-CM | POA: Diagnosis not present

## 2023-09-30 DIAGNOSIS — E871 Hypo-osmolality and hyponatremia: Secondary | ICD-10-CM | POA: Diagnosis not present

## 2023-09-30 DIAGNOSIS — E1122 Type 2 diabetes mellitus with diabetic chronic kidney disease: Secondary | ICD-10-CM | POA: Diagnosis not present

## 2023-09-30 DIAGNOSIS — Z8673 Personal history of transient ischemic attack (TIA), and cerebral infarction without residual deficits: Secondary | ICD-10-CM | POA: Diagnosis not present

## 2023-09-30 DIAGNOSIS — I129 Hypertensive chronic kidney disease with stage 1 through stage 4 chronic kidney disease, or unspecified chronic kidney disease: Secondary | ICD-10-CM | POA: Diagnosis not present

## 2023-10-03 ENCOUNTER — Encounter: Payer: Self-pay | Admitting: Gastroenterology

## 2023-10-03 ENCOUNTER — Ambulatory Visit: Admitting: Gastroenterology

## 2023-10-03 VITALS — BP 130/62 | HR 71 | Ht <= 58 in | Wt 136.0 lb

## 2023-10-03 DIAGNOSIS — K219 Gastro-esophageal reflux disease without esophagitis: Secondary | ICD-10-CM | POA: Diagnosis not present

## 2023-10-03 MED ORDER — OMEPRAZOLE 20 MG PO CPDR: 20.0000 mg | DELAYED_RELEASE_CAPSULE | Freq: Every day | ORAL | 3 refills | Status: AC

## 2023-10-03 NOTE — Patient Instructions (Signed)
 We have sent the following medications to your pharmacy for you to pick up at your convenience: Omeprazole  20 mg daily 30-60 minutes before breakfast.   _______________________________________________________  If your blood pressure at your visit was 140/90 or greater, please contact your primary care physician to follow up on this.  _______________________________________________________  If you are age 79 or older, your body mass index should be between 23-30. Your Body mass index is 29.43 kg/m. If this is out of the aforementioned range listed, please consider follow up with your Primary Care Provider.  If you are age 59 or younger, your body mass index should be between 19-25. Your Body mass index is 29.43 kg/m. If this is out of the aformentioned range listed, please consider follow up with your Primary Care Provider.   ________________________________________________________  The [facility] providers would like to encourage you to use MYCHART to communicate with providers for non-urgent requests or questions.  Due to long hold times on the telephone, sending your provider a message by [facility] may be a faster and more efficient way to get a response.  Please allow 48 business hours for a response.  Please remember that this is for non-urgent requests.  _______________________________________________________

## 2023-10-03 NOTE — Progress Notes (Addendum)
 10/03/2023 Crystal Miller [id number] June 30, 1945   HISTORY OF PRESENT ILLNESS:  This is a 78 year old female, patient of Dr. Jadene Maxwell.  Follows here for GERD.  She comes in today for follow-up and medication refill of her omeprazole  20 mg daily.  She is taking omeprazole  20 mg p.o. every morning which she has been taking over the past few years and says this works very well to control any reflux symptoms. Very occasionally she will have some regurgitation at nighttime with fluid or food coming up into the back of her throat, and says this usually occurs if she eats too late.  Will use pepto-bismol or drink mild to help when this occurs, but it does not occur often.  She had undergone barium swallow in August 2022 which showed normal pharyngeal motion with swallowing, no laryngeal penetration or aspiration, no upper esophageal web stricture diverticuli, there was evidence of esophageal dysmotility with disruption of the primary peristaltic waves, and occasional tertiary contractions, mild esophageal stasis no esophageal mass or stricture, she has moderate sliding hiatal hernia with minimal inducible GE reflux.  13 mm barium tablet passed without difficulty.   No complaints of dysphagia.  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 does not drive Lt side weakness   Past Surgical History:  Procedure Laterality Date   KIDNEY STONE SURGERY     LOWER EXTREMITY ANGIOGRAPHY Left 12/31/2017   Procedure: LOWER EXTREMITY ANGIOGRAPHY;  Surgeon: Jackquelyn Mass, MD;  Location: [facility]  INVASIVE CV LAB;  Service: Cardiovascular;  Laterality: Left;   LOWER EXTREMITY ANGIOGRAPHY Right 04/22/2018   Procedure: LOWER EXTREMITY ANGIOGRAPHY;  Surgeon: Jackquelyn Mass, MD;  Location: [facility] INVASIVE CV LAB;  Service: Cardiovascular;  Laterality: Right;    reports that Latawnya quit smoking about 13 years ago. Francee's smoking use included cigarettes. Eshita started smoking about 33 years ago. Kristanna has a 20 pack-year smoking history. Anitta has never used smokeless tobacco. Rosana reports that Arlana does not drink alcohol and does not use drugs. family history includes Alzheimer's disease in Enrika's mother; Arthritis in Cheralyn's mother and sister; Bone cancer in Aishah's brother; CAD in Karia's father; CVA in Ramiya's father; Diabetes in Loveta's brother and son; Heart disease in Marycatherine's brother and father; Hypertension in Yui's father and mother; Lung cancer in Coraleigh's brother; Stroke in Veora's brother and father. Allergies  Allergen Reactions   Metformin  And Related Diarrhea      Outpatient Encounter Medications as of 10/03/2023  Medication Sig   acetaminophen  (TYLENOL ) 500 MG tablet Take 1,000 mg by mouth every 6 (six) hours as needed (for pain.).   amLODipine  (NORVASC ) 10 MG tablet Take 1 tablet by mouth once daily   aspirin  EC 81 MG EC tablet Take 1 tablet (81 mg total) by mouth daily.   atorvastatin  (LIPITOR) 40 MG tablet TAKE 1 TABLET BY MOUTH ONCE DAILY AT  6PM   blood glucose meter kit and supplies KIT Dispense based on patient and insurance preference. Use up to four times daily as directed. (FOR ICD-9 250.00, 250.01).  carvedilol  (COREG ) 6.25 MG tablet TAKE 1 TABLET BY MOUTH TWICE DAILY WITH A MEAL   diclofenac Sodium (VOLTAREN) 1 % GEL Apply 2 g topically 4 (four) times daily.   Dulaglutide  (TRULICITY ) 0.75 MG/0.5ML SOAJ Inject 0.75 mg into the skin once a week.   Multiple Vitamin (MULTIVITAMIN WITH MINERALS) TABS tablet Take 1 tablet by mouth daily. Centrum Silver . In the  morning   nystatin  (MYCOSTATIN /NYSTOP ) powder Apply topically 2 (two) times daily.   omeprazole  (PRILOSEC) 20 MG capsule TAKE 1 CAPSULE BY MOUTH ONCE DAILY (APPOINTMENT  REQUIRED  FOR  FUTURE  REFILLS)   oxybutynin  (DITROPAN -XL) 5 MG 24 hr tablet    Polyethyl Glycol-Propyl Glycol 0.4-0.3 % SOLN Place 1 drop into both eyes 3 (three) times daily as needed (for dry eyes.).   sertraline  (ZOLOFT ) 25 MG tablet Take 1 tablet by mouth once daily   No facility-administered encounter medications on file as of 10/03/2023.    REVIEW OF SYSTEMS  : All other systems reviewed and negative except where noted in the History of Present Illness.   PHYSICAL EXAM: BP 130/62   Pulse 71   Ht 4\' 9"  (1.448 m)   Wt 136 lb (61.7 kg)   BMI 29.43 kg/m  General: Well developed white female in no acute distress; uses a walker Head: Normocephalic and atraumatic Eyes:  Sclerae anicteric, conjunctiva pink. Ears: Normal auditory acuity Lungs: Clear throughout to auscultation; no W/R/R. Heart: Regular rate and rhythm; no M/R/G. Musculoskeletal: Symmetrical with no gross deformities  Skin: No lesions on visible extremities Neurological: Alert oriented x 4, grossly non-focal Psychological:  Alert and cooperative. Normal mood and affect  ASSESSMENT AND PLAN: 78 year old female with chronic GERD, well-controlled on low-dose omeprazole  20 mg p.o. every morning Barium swallow 2022 had shown a moderate sliding hiatal hernia, no evidence of stricture and minimal inducible GE reflux.    -Continue omeprazole  20 mg daily.  Prescription sent to pharmacy. -Follow up in 1-2 years or sooner if needed.  CC:  No ref. provider found

## 2023-10-07 ENCOUNTER — Other Ambulatory Visit (INDEPENDENT_AMBULATORY_CARE_PROVIDER_SITE_OTHER): Payer: Self-pay | Admitting: Vascular Surgery

## 2023-10-07 DIAGNOSIS — I739 Peripheral vascular disease, unspecified: Secondary | ICD-10-CM

## 2023-10-09 ENCOUNTER — Ambulatory Visit (INDEPENDENT_AMBULATORY_CARE_PROVIDER_SITE_OTHER): Admitting: Nurse Practitioner

## 2023-10-09 ENCOUNTER — Encounter (INDEPENDENT_AMBULATORY_CARE_PROVIDER_SITE_OTHER): Payer: Self-pay | Admitting: Nurse Practitioner

## 2023-10-09 ENCOUNTER — Ambulatory Visit (INDEPENDENT_AMBULATORY_CARE_PROVIDER_SITE_OTHER)

## 2023-10-09 VITALS — BP 173/85 | HR 66 | Resp 18 | Ht <= 58 in | Wt 137.2 lb

## 2023-10-09 DIAGNOSIS — I70213 Atherosclerosis of native arteries of extremities with intermittent claudication, bilateral legs: Secondary | ICD-10-CM

## 2023-10-09 DIAGNOSIS — I10 Essential (primary) hypertension: Secondary | ICD-10-CM

## 2023-10-09 DIAGNOSIS — Z9889 Other specified postprocedural states: Secondary | ICD-10-CM

## 2023-10-09 DIAGNOSIS — E118 Type 2 diabetes mellitus with unspecified complications: Secondary | ICD-10-CM

## 2023-10-09 DIAGNOSIS — I739 Peripheral vascular disease, unspecified: Secondary | ICD-10-CM

## 2023-10-10 DIAGNOSIS — N1832 Chronic kidney disease, stage 3b: Secondary | ICD-10-CM | POA: Diagnosis not present

## 2023-10-10 DIAGNOSIS — E1122 Type 2 diabetes mellitus with diabetic chronic kidney disease: Secondary | ICD-10-CM | POA: Diagnosis not present

## 2023-10-10 DIAGNOSIS — N2581 Secondary hyperparathyroidism of renal origin: Secondary | ICD-10-CM | POA: Diagnosis not present

## 2023-10-10 DIAGNOSIS — D631 Anemia in chronic kidney disease: Secondary | ICD-10-CM | POA: Diagnosis not present

## 2023-10-10 DIAGNOSIS — I10 Essential (primary) hypertension: Secondary | ICD-10-CM | POA: Diagnosis not present

## 2023-10-10 DIAGNOSIS — R8281 Pyuria: Secondary | ICD-10-CM | POA: Diagnosis not present

## 2023-10-10 LAB — VAS US ABI WITH/WO TBI
Left ABI: 1.44
Right ABI: 1.22

## 2023-10-14 NOTE — Progress Notes (Signed)
 Subjective:    Patient ID: Crystal Miller, female    DOB: 11/18/1945, 78 y.o.   MRN: [id number] Chief Complaint  Patient presents with   Follow-up    Confrimed: f/u in 1 year with abi    The patient returns to the office for followup regarding atherosclerotic changes of the lower extremities and review of the noninvasive studies.   There have been no interval changes in lower extremity symptoms. No interval shortening of the patient's claudication distance or development of rest pain symptoms. No new ulcers or wounds have occurred since the last visit.  There have been no significant changes to the patient's overall health care.  The patient denies amaurosis fugax or recent TIA symptoms. There are no documented recent neurological changes noted. There is no history of DVT, PE or superficial thrombophlebitis. The patient denies recent episodes of angina or shortness of breath.   ABI Rt=1.22 and Lt=1.44  (previous ABI's Rt=0.95 and Lt=1.00) Duplex ultrasound of the right lower extremity shows biphasic waveforms with triphasic waveforms on the left.  She has good toe waveforms bilaterally.    Review of Systems  All other systems reviewed and are negative.      Objective:    Physical Exam Vitals reviewed.  HENT:     Head: Normocephalic.  Cardiovascular:     Rate and Rhythm: Normal rate.     Pulses:          Dorsalis pedis pulses are detected w/ Doppler on the right side and detected w/ Doppler on the left side.       Posterior tibial pulses are detected w/ Doppler on the right side and detected w/ Doppler on the left side.  Pulmonary:     Effort: Pulmonary effort is normal.  Skin:    General: Skin is warm and dry.  Neurological:     Mental Status: [name] is alert and oriented to person, place, and time.  Psychiatric:        Mood and Affect: Mood normal.        Behavior: Behavior normal.        Thought Content: Thought content normal.        Judgment: Judgment normal.      BP (!) 173/85   Pulse 66   Resp 18   Ht 4\' 9"  (1.448 m)   Wt 137 lb 3.2 oz (62.2 kg)   BMI 29.69 kg/m   Past Medical History:  Diagnosis Date   Acute pyelonephritis August 31, 2018   Depression    husband died in 11/19/15   DM (diabetes mellitus), 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   Essential hypertension 11/01/2016   GERD (gastroesophageal reflux disease)    Kidney stones    Mixed hyperlipidemia 11/01/2016   Peripheral vascular disease (HCC)    Renal insufficiency    Right pontine stroke (HCC) 03/27/2017   Stroke (HCC) 2016-11-18   slight residual with speech. does not drive Lt side weakness    Social History   Socioeconomic History   Marital status: Widowed    Spouse name: Not on file   Number of children: 3   Years of education: Not on file   Highest education level: Not on file  Occupational History   Occupation: Higher education careers adviser    Comment: retired   Occupation: retired  Tobacco Use   Smoking status: Former    Current packs/day: 0.00    Average packs/day: 1 pack/day for 20.0 years (20.0 ttl pk-yrs)    Types:  Cigarettes    Start date: 79    Quit date: 2012    Years since quitting: 13.4   Smokeless tobacco: Never  Vaping Use   Vaping status: Never Used  Substance and Sexual Activity   Alcohol use: No   Drug use: No   Sexual activity: Not Currently    Birth control/protection: Post-menopausal  Other Topics Concern   Not on file  Social History Narrative   Not on file   Social Drivers of Health   Financial Resource Strain: Low Risk  (12/04/2022)   Overall Financial Resource Strain (CARDIA)    Difficulty of Paying Living Expenses: Not hard at all  Food Insecurity: No Food Insecurity (08/19/2023)   Hunger Vital Sign    Worried About Running Out of Food in the Last Year: Never true    Ran Out of Food in the Last Year: Never true  Transportation Needs: No Transportation Needs (08/19/2023)   PRAPARE - Administrator, Civil Service  (Medical): No    Lack of Transportation (Non-Medical): No  Physical Activity: Sufficiently Active (12/04/2022)   Exercise Vital Sign    Days of Exercise per Week: 7 days    Minutes of Exercise per Session: 30 min  Stress: No Stress Concern Present (12/04/2022)   [employer] of Occupational Health - Occupational Stress Questionnaire    Feeling of Stress : Not at all  Social Connections: Moderately Integrated (08/19/2023)   Social Connection and Isolation Panel [NHANES]    Frequency of Communication with Friends and Family: More than three times a week    Frequency of Social Gatherings with Friends and Family: More than three times a week    Attends Religious Services: More than 4 times per year    Active Member of [employer] or Organizations: Yes    Attends Banker Meetings: 1 to 4 times per year    Marital Status: Widowed  Intimate Partner Violence: Not At Risk (08/19/2023)   Humiliation, Afraid, Rape, and Kick questionnaire    Fear of Current or Ex-Partner: No    Emotionally Abused: No    Physically Abused: No    Sexually Abused: No    Past Surgical History:  Procedure Laterality Date   KIDNEY STONE SURGERY     LOWER EXTREMITY ANGIOGRAPHY Left 12/31/2017   Procedure: LOWER EXTREMITY ANGIOGRAPHY;  Surgeon: Jackquelyn Mass, MD;  Location: [facility];  Service: Cardiovascular;  Laterality: Left;   LOWER EXTREMITY ANGIOGRAPHY Right 04/22/2018   Procedure: LOWER EXTREMITY ANGIOGRAPHY;  Surgeon: Jackquelyn Mass, MD;  Location: [facility];  Service: Cardiovascular;  Laterality: Right;    Family History  Problem Relation Age of Onset   Alzheimer's disease Mother    Arthritis Mother    Hypertension Mother    CVA Father    CAD Father    Heart disease Father    Stroke Father    Hypertension Father    Arthritis Sister    Stroke Brother    Diabetes Brother    Heart disease Brother    Bone cancer Brother    Lung cancer Brother    Diabetes Son     Breast cancer Neg Hx     Allergies  Allergen Reactions   Metformin  And Related Diarrhea       Latest Ref Rng & Units 09/16/2023    2:04 PM 08/24/2023    4:05 AM 08/22/2023    4:30 AM  CBC  WBC 4.0 - 10.5  K/uL 10.4  6.3  7.4   Hemoglobin 12.0 - 15.0 g/dL 84.6  9.6  9.4   Hematocrit 36.0 - 46.0 % 32.2  27.2  26.6   Platelets 150.0 - 400.0 K/uL 356.0  338  284        CMP     Component Value Date/Time   NA 136 09/16/2023 1404   NA 137 09/11/2019 1534   K 5.0 09/16/2023 1404   CL 107 09/16/2023 1404   CO2 23 09/16/2023 1404   GLUCOSE 92 09/16/2023 1404   BUN 35 (H) 09/16/2023 1404   BUN 26 09/11/2019 1534   CREATININE 1.57 (H) 09/16/2023 1404   CALCIUM  9.4 09/16/2023 1404   PROT 7.4 09/16/2023 1404   ALBUMIN 3.8 09/16/2023 1404   AST 18 09/16/2023 1404   ALT 16 09/16/2023 1404   ALKPHOS 88 09/16/2023 1404   BILITOT 0.4 09/16/2023 1404   GFR 31.26 (L) 09/16/2023 1404   GFRNONAA 46 (L) 08/24/2023 0405     VAS US  ABI WITH/WO TBI Result Date: 10/10/2023  LOWER EXTREMITY DOPPLER STUDY Patient Name:  Crystal Miller  Date of Exam:   10/09/2023 Medical Rec #: [id number]      Accession #:    [id number] Date of Birth: Mar 29, 1946       Patient Gender: F Patient Age:   17 years Exam Location:  [facility] Procedure:      VAS US  ABI WITH/WO TBI Referring Phys: [facility] --------------------------------------------------------------------------------  Indications: Rest pain, and peripheral artery disease. High Risk Factors: Hyperlipidemia, Diabetes, past history of smoking.  Vascular Interventions: 12/31/2017 PTA of the SFA and popliteal artery, bilateral                         CIA stents.                          04/22/2018 PTA: Rt SFA , Popliteal Artery, PFA, Poterior                         Tibial Artery and Tibial Peroneal Trunk. Performing Technologist: Oneta Bilberry RVT  Examination Guidelines: A complete evaluation includes at minimum, Doppler waveform signals and  systolic blood pressure reading at the level of bilateral brachial, anterior tibial, and posterior tibial arteries, when vessel segments are accessible. Bilateral testing is considered an integral part of a complete examination. Photoelectric Plethysmograph (PPG) waveforms and toe systolic pressure readings are included as required and additional duplex testing as needed. Limited examinations for reoccurring indications may be performed as noted.  ABI Findings: +---------+------------------+-----+--------+--------+ Right    Rt Pressure (mmHg)IndexWaveformComment  +---------+------------------+-----+--------+--------+ Brachial 136                                     +---------+------------------+-----+--------+--------+ PTA      169               1.22 biphasic         +---------+------------------+-----+--------+--------+ DP       152               1.09 biphasic         +---------+------------------+-----+--------+--------+ Great Toe92                0.66                  +---------+------------------+-----+--------+--------+ +---------+------------------+-----+---------+-------+  Left     Lt Pressure (mmHg)IndexWaveform Comment +---------+------------------+-----+---------+-------+ Brachial 139                                     +---------+------------------+-----+---------+-------+ PTA      200               1.44 triphasic        +---------+------------------+-----+---------+-------+ DP       193               1.39 triphasic        +---------+------------------+-----+---------+-------+ Great Toe89                0.64                  +---------+------------------+-----+---------+-------+ +-------+-----------+-----------+------------+------------+ ABI/TBIToday's ABIToday's TBIPrevious ABIPrevious TBI +-------+-----------+-----------+------------+------------+ Right  1.22       0.66       0.95        0.57          +-------+-----------+-----------+------------+------------+ Left   1.44       0.64       1.00        0.44         +-------+-----------+-----------+------------+------------+ Arterial wall calcification precludes accurate ankle pressures and ABIs. Right ABIs appear increased compared to prior study on 09/17/2022. Left ABIs appear increased compared to prior study on 09/17/2022.  Summary: Right: Resting right ankle-brachial index is within normal range. The right toe-brachial index is abnormal. Left: Resting left ankle-brachial index indicates noncompressible left lower extremity arteries. The left toe-brachial index is abnormal. *See table(s) above for measurements and observations.  Electronically signed by Devon Fogo MD on 10/10/2023 at 8:41:19 AM.    Final        Assessment & Plan:   1. Atherosclerosis of native artery of both lower extremities with intermittent claudication (HCC) (Primary)  Recommend:  The patient has evidence of atherosclerosis of the lower extremities with claudication.  The patient does not voice lifestyle limiting changes at this point in time.  Noninvasive studies do not suggest clinically significant change.  No invasive studies, angiography or surgery at this time The patient should continue walking and begin a more formal exercise program.  The patient should continue antiplatelet therapy and aggressive treatment of the lipid abnormalities  No changes in the patient's medications at this time  Continued surveillance is indicated as atherosclerosis is likely to progress with time.    The patient will continue follow up with noninvasive studies as ordered.   2. Essential hypertension Continue antihypertensive medications as already ordered, these medications have been reviewed and there are no changes at this time.  3. DM (diabetes mellitus), type 2 with complications (HCC) Continue hypoglycemic medications as already ordered, these medications have been  reviewed and there are no changes at this time.  Hgb A1C to be monitored as already arranged by primary service   Current Outpatient Medications on File Prior to Visit  Medication Sig Dispense Refill   acetaminophen  (TYLENOL ) 500 MG tablet Take 1,000 mg by mouth every 6 (six) hours as needed (for pain.).     amLODipine  (NORVASC ) 10 MG tablet Take 1 tablet by mouth once daily 90 tablet 0   aspirin  EC 81 MG EC tablet Take 1 tablet (81 mg total) by mouth daily. 30 tablet 2   atorvastatin  (LIPITOR) 40 MG tablet TAKE 1 TABLET BY MOUTH  ONCE DAILY AT  6PM 90 tablet 1   blood glucose meter kit and supplies KIT Dispense based on patient and insurance preference. Use up to four times daily as directed. (FOR ICD-9 250.00, 250.01). 1 each 0   carvedilol  (COREG ) 6.25 MG tablet TAKE 1 TABLET BY MOUTH TWICE DAILY WITH A MEAL 180 tablet 0   diclofenac Sodium (VOLTAREN) 1 % GEL Apply 2 g topically 4 (four) times daily.     Dulaglutide  (TRULICITY ) 0.75 MG/0.5ML SOAJ Inject 0.75 mg into the skin once a week. 6 mL 1   Multiple Vitamin (MULTIVITAMIN WITH MINERALS) TABS tablet Take 1 tablet by mouth daily. Centrum Silver . In the morning     nystatin  (MYCOSTATIN /NYSTOP ) powder Apply topically 2 (two) times daily.     omeprazole  (PRILOSEC) 20 MG capsule Take 1 capsule (20 mg total) by mouth daily. 90 capsule 3   oxybutynin  (DITROPAN -XL) 5 MG 24 hr tablet      Polyethyl Glycol-Propyl Glycol 0.4-0.3 % SOLN Place 1 drop into both eyes 3 (three) times daily as needed (for dry eyes.).     sertraline  (ZOLOFT ) 25 MG tablet Take 1 tablet by mouth once daily 90 tablet 0   No current facility-administered medications on file prior to visit.    There are no Patient Instructions on file for this visit. No follow-ups on file.   Aiden Helzer E Avyn Coate, NP

## 2023-10-28 ENCOUNTER — Other Ambulatory Visit: Payer: Self-pay | Admitting: Family Medicine

## 2023-10-28 DIAGNOSIS — I10 Essential (primary) hypertension: Secondary | ICD-10-CM

## 2023-10-28 NOTE — Telephone Encounter (Signed)
 Copied from CRM [id number]. Topic: Clinical - Medication Refill >> Oct 28, 2023 10:28 AM Chuck Crater wrote: Medication: amLODipine  (NORVASC ) 10 MG tablet [[id number]]  Has the patient contacted their pharmacy? Yes (Agent: If no, request that the patient contact the pharmacy for the refill. If patient does not wish to contact the pharmacy document the reason why and proceed with request.) (Agent: If yes, when and what did the pharmacy advise?) waiting on approval from doctor   This is the patient's preferred pharmacy:  [facility] [address], [place] - [address] [place] [address] Phone: [phone] Fax: [phone]  Is this the correct pharmacy for this prescription? Yes If no, delete pharmacy and type the correct one.   Has the prescription been filled recently? No  Is the patient out of the medication? Yes  Has the patient been seen for an appointment in the last year OR does the patient have an upcoming appointment? Yes  Can we respond through MyChart? No  Agent: Please be advised that Rx refills may take up to 3 business days. We ask that you follow-up with your pharmacy.

## 2023-10-31 NOTE — Telephone Encounter (Signed)
 Copied from CRM [id number]. Topic: Clinical - Medication Refill >> Oct 28, 2023 10:28 AM Chuck Crater wrote: Medication: amLODipine  (NORVASC ) 10 MG tablet [[id number]]  Has the patient contacted their pharmacy? Yes (Agent: If no, request that the patient contact the pharmacy for the refill. If patient does not wish to contact the pharmacy document the reason why and proceed with request.) (Agent: If yes, when and what did the pharmacy advise?) waiting on approval from doctor   This is the patient's preferred pharmacy:  [facility] [address] [place] - [address] [place] [address] Phone: [phone] Fax: [phone]  Is this the correct pharmacy for this prescription? Yes If no, delete pharmacy and type the correct one.   Has the prescription been filled recently? No  Is the patient out of the medication? Yes  Has the patient been seen for an appointment in the last year OR does the patient have an upcoming appointment? Yes  Can we respond through MyChart? No  Agent: Please be advised that Rx refills may take up to 3 business days. We ask that you follow-up with your pharmacy. >> Oct 31, 2023 10:59 AM Kita Perish H wrote: Patient following up on refill request for her amLODipine  (NORVASC ) 10 MG tablet please reach out, patient is out of medication.  Stana Ear [phone]

## 2023-11-01 ENCOUNTER — Other Ambulatory Visit: Payer: Self-pay

## 2023-11-01 DIAGNOSIS — I10 Essential (primary) hypertension: Secondary | ICD-10-CM

## 2023-11-01 MED ORDER — AMLODIPINE BESYLATE 10 MG PO TABS: 10.0000 mg | ORAL_TABLET | Freq: Every day | ORAL | 0 refills | Status: DC

## 2023-11-01 MED ORDER — CARVEDILOL 6.25 MG PO TABS: 6.2500 mg | ORAL_TABLET | Freq: Two times a day (BID) | ORAL | 0 refills | Status: DC

## 2023-11-04 ENCOUNTER — Ambulatory Visit (INDEPENDENT_AMBULATORY_CARE_PROVIDER_SITE_OTHER): Admitting: Podiatry

## 2023-11-04 DIAGNOSIS — Z91198 Patient's noncompliance with other medical treatment and regimen for other reason: Secondary | ICD-10-CM

## 2023-11-07 NOTE — Progress Notes (Signed)
 1. Failure to attend appointment with reason given    Appointment canceled and rescheduled by patient.

## 2023-11-18 ENCOUNTER — Encounter: Payer: Self-pay | Admitting: Podiatry

## 2023-11-18 ENCOUNTER — Ambulatory Visit: Admitting: Podiatry

## 2023-11-18 DIAGNOSIS — M205X1 Other deformities of toe(s) (acquired), right foot: Secondary | ICD-10-CM

## 2023-11-18 DIAGNOSIS — E118 Type 2 diabetes mellitus with unspecified complications: Secondary | ICD-10-CM | POA: Diagnosis not present

## 2023-11-18 DIAGNOSIS — M205X2 Other deformities of toe(s) (acquired), left foot: Secondary | ICD-10-CM | POA: Diagnosis not present

## 2023-11-18 DIAGNOSIS — B351 Tinea unguium: Secondary | ICD-10-CM

## 2023-11-18 DIAGNOSIS — M79674 Pain in right toe(s): Secondary | ICD-10-CM | POA: Diagnosis not present

## 2023-11-18 DIAGNOSIS — E119 Type 2 diabetes mellitus without complications: Secondary | ICD-10-CM

## 2023-11-18 DIAGNOSIS — M79675 Pain in left toe(s): Secondary | ICD-10-CM | POA: Diagnosis not present

## 2023-11-18 NOTE — Progress Notes (Signed)
 ANNUAL DIABETIC FOOT EXAM  Subjective: Crystal Miller presents today for annual diabetic foot exam.  Patient confirms h/o diabetes.  Narendra, Nischal, MD is patient's PCP. LOV 09/16/2023.  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 does not drive Lt side weakness   Patient Active Problem List   Diagnosis Date Noted   Sepsis (HCC) 08/17/2023   Pharyngitis 08/17/2023   Hyponatremia 08/17/2023   AKI (acute kidney injury) (HCC) 08/17/2023   Hypocalcemia 08/17/2023   C. difficile colitis 08/17/2023   Transaminitis 08/17/2023   Elevated troponin 08/17/2023   Cerumen impaction 02/01/2023   Atherosclerosis of native arteries of extremity with intermittent claudication (HCC) 03/12/2022   Bruising 01/17/2022   Foot laceration 07/25/2021   Onychomycosis 07/25/2021   Subclinical hypothyroidism 04/23/2020   Chronic kidney disease due to hypertension 12/02/2019   Localized edema 12/02/2019   GERD (gastroesophageal reflux disease) 08/19/2019   Diabetic retinopathy (HCC) 12/04/2018   Normocytic anemia 10/03/2018   Overactive bladder 06/30/2018   Mood disorder (HCC) 02/19/2018   PAD (peripheral artery disease) (HCC) 10/10/2017   CVA (cerebral vascular accident) (HCC) 03/27/2017   DM (diabetes mellitus), 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   Essential hypertension 11/01/2016   Mixed hyperlipidemia 11/01/2016   Memory change 11/01/2016   History of stroke 10/01/2016   Past Surgical History:  Procedure Laterality Date   KIDNEY STONE SURGERY     LOWER EXTREMITY  ANGIOGRAPHY Left 12/31/2017   Procedure: LOWER EXTREMITY ANGIOGRAPHY;  Surgeon: Jama Cordella MATSU, MD;  Location: [facility];  Service: Cardiovascular;  Laterality: Left;   LOWER EXTREMITY ANGIOGRAPHY Right 04/22/2018   Procedure: LOWER EXTREMITY ANGIOGRAPHY;  Surgeon: Jama Cordella MATSU, MD;  Location: [facility];  Service: Cardiovascular;  Laterality: Right;   Current Outpatient Medications on File Prior to Visit  Medication Sig Dispense Refill   acetaminophen  (TYLENOL ) 500 MG tablet Take 1,000 mg by mouth every 6 (six) hours as needed (for pain.).     amLODipine  (NORVASC ) 10 MG tablet Take 1 tablet (10 mg total) by mouth daily. 90 tablet 0   aspirin  EC 81 MG EC tablet Take 1 tablet (81 mg total) by mouth daily. 30 tablet 2   atorvastatin  (LIPITOR) 40 MG tablet TAKE 1 TABLET BY MOUTH ONCE DAILY AT  6PM 90 tablet 1   blood glucose meter kit and supplies KIT Dispense based on patient and insurance preference. Use up to four times daily as directed. (FOR ICD-9 250.00, 250.01). 1 each 0   carvedilol  (COREG ) 6.25 MG tablet Take 1 tablet (6.25 mg total) by mouth 2 (two) times daily with a meal. 180 tablet 0   diclofenac Sodium (VOLTAREN) 1 % GEL Apply 2 g topically 4 (four) times daily.     Dulaglutide  (TRULICITY ) 0.75 MG/0.5ML SOAJ Inject 0.75 mg into the skin once a week. 6 mL 1   Multiple Vitamin (MULTIVITAMIN WITH MINERALS) TABS tablet Take 1 tablet by mouth daily. Centrum Silver . In the morning     nystatin  (MYCOSTATIN /NYSTOP ) powder Apply topically 2 (two) times  daily.     omeprazole  (PRILOSEC) 20 MG capsule Take 1 capsule (20 mg total) by mouth daily. 90 capsule 3   oxybutynin  (DITROPAN -XL) 5 MG 24 hr tablet      Polyethyl Glycol-Propyl Glycol 0.4-0.3 % SOLN Place 1 drop into both eyes 3 (three) times daily as needed (for dry eyes.).     sertraline  (ZOLOFT ) 25 MG tablet Take 1 tablet by mouth once daily 90 tablet 0   No current facility-administered medications on file  prior to visit.    Allergies  Allergen Reactions   Metformin  And Related Diarrhea   Social History   Occupational History   Occupation: Higher education careers adviser    Comment: retired   Occupation: retired  Tobacco Use   Smoking status: Former    Current packs/day: 0.00    Average packs/day: 1 pack/day for 20.0 years (20.0 ttl pk-yrs)    Types: Cigarettes    Start date: 40    Quit date: 2012    Years since quitting: 13.5   Smokeless tobacco: Never  Vaping Use   Vaping status: Never Used  Substance and Sexual Activity   Alcohol use: No   Drug use: No   Sexual activity: Not Currently    Birth control/protection: Post-menopausal   Family History  Problem Relation Age of Onset   Alzheimer's disease Mother    Arthritis Mother    Hypertension Mother    CVA Father    CAD Father    Heart disease Father    Stroke Father    Hypertension Father    Arthritis Sister    Stroke Brother    Diabetes Brother    Heart disease Brother    Bone cancer Brother    Lung cancer Brother    Diabetes Son    Breast cancer Neg Hx    Immunization History  Administered Date(s) Administered   Fluad Quad(high Dose 65+) 02/26/2019, 01/27/2020, 07/17/2021, 03/16/2022   Influenza, High Dose Seasonal PF 02/19/2018   PFIZER(Purple Top)SARS-COV-2 Vaccination 07/11/2019, 08/04/2019, 03/22/2020   Pfizer Covid-19 Vaccine Bivalent Booster 66yrs & up 02/02/2021   Pneumococcal Conjugate-13 10/30/2017   Pneumococcal Polysaccharide-23 10/02/2016   Tdap 02/02/2021     Review of Systems: Negative except as noted in the HPI.   Objective: There were no vitals filed for this visit.  Crystal Miller is a pleasant 78 y.o. female in NAD. AAO X 3.  Diabetic foot exam was performed with the following findings:   Vascular Examination: Capillary refill time immediate b/l. Palpable pedal pulses. Pedal hair present b/l. No pain with calf compression b/l. Skin temperature gradient WNL b/l. No cyanosis or clubbing b/l.  No ischemia or gangrene noted b/l. No varicosities noted.  Neurological Examination: Sensation grossly intact b/l with 10 gram monofilament. Vibratory sensation intact b/l.   Dermatological Examination: Pedal skin with normal turgor, texture and tone b/l.  No open wounds. No interdigital macerations.   Toenails 1-5 b/l thick, discolored, elongated with subungual debris and pain on dorsal palpation.   No corns, calluses nor porokeratotic lesions noted.  Musculoskeletal Examination: Muscle strength 5/5 to all lower extremity muscle groups bilaterally. No pain, crepitus or joint limitation noted with ROM b/l LE. Adductovarus deformity bilateral 5th toes. Patient ambulates with rollator assistance.  Radiographs: None     Lab Results  Component Value Date   HGBA1C 6.2 01/30/2023   VAS US  ABI WITH/WO TBI Result Date: 10/10/2023  LOWER EXTREMITY DOPPLER STUDY Patient Name:  Crystal Miller  Date of Exam:  10/09/2023 Medical Rec #: [id number]      Accession #:    [id number] Date of Birth: 1945/10/30       Patient Gender: F Patient Age:   37 years Exam Location:  [facility] Procedure:      VAS US  ABI WITH/WO TBI Referring Phys: [facility] --------------------------------------------------------------------------------  Indications: Rest pain, and peripheral artery disease. High Risk Factors: Hyperlipidemia, Diabetes, past history of smoking.  Vascular Interventions: 12/31/2017 PTA of the SFA and popliteal artery, bilateral                         CIA stents.                          04/22/2018 PTA: Rt SFA , Popliteal Artery, PFA, Poterior                         Tibial Artery and Tibial Peroneal Trunk. Performing Technologist: Donnice Charnley RVT  Examination Guidelines: A complete evaluation includes at minimum, Doppler waveform signals and systolic blood pressure reading at the level of bilateral brachial, anterior tibial, and posterior tibial arteries, when vessel segments are  accessible. Bilateral testing is considered an integral part of a complete examination. Photoelectric Plethysmograph (PPG) waveforms and toe systolic pressure readings are included as required and additional duplex testing as needed. Limited examinations for reoccurring indications may be performed as noted.  ABI Findings: +---------+------------------+-----+--------+--------+ Right    Rt Pressure (mmHg)IndexWaveformComment  +---------+------------------+-----+--------+--------+ Brachial 136                                     +---------+------------------+-----+--------+--------+ PTA      169               1.22 biphasic         +---------+------------------+-----+--------+--------+ DP       152               1.09 biphasic         +---------+------------------+-----+--------+--------+ Great Toe92                0.66                  +---------+------------------+-----+--------+--------+ +---------+------------------+-----+---------+-------+ Left     Lt Pressure (mmHg)IndexWaveform Comment +---------+------------------+-----+---------+-------+ Brachial 139                                     +---------+------------------+-----+---------+-------+ PTA      200               1.44 triphasic        +---------+------------------+-----+---------+-------+ DP       193               1.39 triphasic        +---------+------------------+-----+---------+-------+ Great Toe89                0.64                  +---------+------------------+-----+---------+-------+ +-------+-----------+-----------+------------+------------+ ABI/TBIToday's ABIToday's TBIPrevious ABIPrevious TBI +-------+-----------+-----------+------------+------------+ Right  1.22       0.66       0.95        0.57         +-------+-----------+-----------+------------+------------+  Left   1.44       0.64       1.00        0.44          +-------+-----------+-----------+------------+------------+ Arterial wall calcification precludes accurate ankle pressures and ABIs. Right ABIs appear increased compared to prior study on 09/17/2022. Left ABIs appear increased compared to prior study on 09/17/2022.  Summary: Right: Resting right ankle-brachial index is within normal range. The right toe-brachial index is abnormal. Left: Resting left ankle-brachial index indicates noncompressible left lower extremity arteries. The left toe-brachial index is abnormal. *See table(s) above for measurements and observations.  Electronically signed by Cordella Shawl MD on 10/10/2023 at 8:41:19 AM.    Final    ADA Risk Categorization: Low Risk :  Patient has all of the following: Intact protective sensation No prior foot ulcer  No severe deformity Pedal pulses present  Assessment: 1. Pain due to onychomycosis of toenails of both feet   2. 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 Adductovarus rotation of toe, acquired, right   4. DM (diabetes mellitus), type 2 with complications (HCC)   5. Encounter for diabetic foot exam (HCC)     Plan: Diabetic foot examination performed today. All patient's and/or POA's questions/concerns addressed on today's visit. Toenails 1-5 debrided in length and girth without incident. Continue foot and shoe inspections daily. Monitor blood glucose per PCP/Endocrinologist's recommendations. Continue soft, supportive shoe gear daily. Report any pedal injuries to medical professional. Call office if there are any questions/concerns. -Patient/POA to call should there be question/concern in the interim. Return in about 3 months (around 02/18/2024).  Delon LITTIE Merlin, DPM      [place] LOCATION: [address], [place] [address]                   Office [phone]   [facility] LOCATION: [address] [place], [place] [address] Office [phone]

## 2023-12-06 ENCOUNTER — Ambulatory Visit (INDEPENDENT_AMBULATORY_CARE_PROVIDER_SITE_OTHER): Payer: Medicare HMO | Admitting: *Deleted

## 2023-12-06 VITALS — Ht <= 58 in | Wt 130.0 lb

## 2023-12-06 DIAGNOSIS — Z0000 Encounter for general adult medical examination without abnormal findings: Secondary | ICD-10-CM

## 2023-12-06 NOTE — Progress Notes (Signed)
 Subjective:   Crystal Miller is a 78 y.o. who presents for a Medicare Wellness preventive visit.  As a reminder, Annual Wellness Visits don't include a physical exam, and some assessments may be limited, especially if this visit is performed virtually. We may recommend an in-person follow-up visit with your provider if needed.  Visit Complete: Virtual I connected with  Crystal Miller on 12/06/23 by a audio enabled telemedicine application and verified that I am speaking with the correct person using two identifiers.  Patient Location: Home  Provider Location: Home Office  I discussed the limitations of evaluation and management by telemedicine. The patient expressed understanding and agreed to proceed.  Vital Signs: Because this visit was a virtual/telehealth visit, some criteria may be missing or patient reported. Any vitals not documented were not able to be obtained and vitals that have been documented are patient reported.  VideoDeclined- This patient declined Librarian, academic. Therefore the visit was completed with audio only.  Persons Participating in Visit: Patient.  AWV Questionnaire: No: Patient Medicare AWV questionnaire was not completed prior to this visit.  Cardiac Risk Factors include: advanced age (>46men, >5 women);diabetes mellitus;dyslipidemia;obesity (BMI >30kg/m2);hypertension     Objective:    Today's Vitals   12/06/23 1120  Weight: 130 lb (59 kg)  Height: 4' 7 (1.397 m)   Body mass index is 30.21 kg/m.     12/06/2023   11:37 AM 08/19/2023   12:00 PM 08/17/2023    3:36 PM 12/04/2022    1:39 PM 11/30/2021    2:06 PM 11/29/2020    3:00 PM 11/24/2019   12:04 PM  Advanced Directives  Does Patient Have a Medical Advance Directive? No Yes Yes No Yes Yes Yes  Type of Physiological scientist  Out of facility DNR (pink MOST or yellow form) Healthcare Power of [place];Living will  Out of facility DNR (pink MOST or yellow form)  Does patient want to make changes to medical advance directive?  No - Patient declined   No - Patient declined No - Patient declined No - Patient declined  Copy of Healthcare Power of Attorney in Chart?  No - copy requested    Yes - validated most recent copy scanned in chart (See row information)   Would patient like information on creating a medical advance directive? No - Patient declined   No - Patient declined       Current Medications (verified) Outpatient Encounter Medications as of 12/06/2023  Medication Sig   acetaminophen  (TYLENOL ) 500 MG tablet Take 1,000 mg by mouth every 6 (six) hours as needed (for pain.).   amLODipine  (NORVASC ) 10 MG tablet Take 1 tablet (10 mg total) by mouth daily.   aspirin  EC 81 MG EC tablet Take 1 tablet (81 mg total) by mouth daily.   atorvastatin  (LIPITOR) 40 MG tablet TAKE 1 TABLET BY MOUTH ONCE DAILY AT  6PM   blood glucose meter kit and supplies KIT Dispense based on patient and insurance preference. Use up to four times daily as directed. (FOR ICD-9 250.00, 250.01).   carvedilol  (COREG ) 6.25 MG tablet Take 1 tablet (6.25 mg total) by mouth 2 (two) times daily with a meal.   diclofenac Sodium (VOLTAREN) 1 % GEL Apply 2 g topically 4 (four) times daily. (Patient taking differently: Apply 2 g topically daily as needed.)   Dulaglutide  (TRULICITY ) 0.75 MG/0.5ML SOAJ Inject 0.75 mg into the skin once a week.  Ferrous Sulfate (IRON PO) Take by mouth daily.   Multiple Vitamin (MULTIVITAMIN WITH MINERALS) TABS tablet Take 1 tablet by mouth daily. Centrum Silver . In the morning   omeprazole  (PRILOSEC) 20 MG capsule Take 1 capsule (20 mg total) by mouth daily.   oxybutynin  (DITROPAN -XL) 5 MG 24 hr tablet    sertraline  (ZOLOFT ) 25 MG tablet Take 1 tablet by mouth once daily   nystatin  (MYCOSTATIN /NYSTOP ) powder Apply topically 2 (two) times daily. (Patient not taking: Reported on 12/06/2023)   Polyethyl  Glycol-Propyl Glycol 0.4-0.3 % SOLN Place 1 drop into both eyes 3 (three) times daily as needed (for dry eyes.). (Patient not taking: Reported on 12/06/2023)   No facility-administered encounter medications on file as of 12/06/2023.    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 does not drive Lt side weakness   Past Surgical History:  Procedure Laterality Date   KIDNEY STONE SURGERY     LOWER EXTREMITY ANGIOGRAPHY Left 12/31/2017   Procedure: LOWER EXTREMITY ANGIOGRAPHY;  Surgeon: Jama Cordella MATSU, MD;  Location: [facility] INVASIVE CV LAB;  Service: Cardiovascular;  Laterality: Left;   LOWER EXTREMITY ANGIOGRAPHY Right 04/22/2018   Procedure: LOWER EXTREMITY ANGIOGRAPHY;  Surgeon: Jama Cordella MATSU, MD;  Location: [facility] INVASIVE CV LAB;  Service: Cardiovascular;  Laterality: Right;   Family History  Problem Relation Age of Onset   Alzheimer's disease Mother    Arthritis Mother    Hypertension Mother    CVA Father    CAD Father    Heart disease Father    Stroke Father    Hypertension Father    Arthritis Sister    Stroke Brother    Diabetes Brother    Heart disease Brother    Bone cancer Brother    Lung cancer Brother    Diabetes Son    Breast cancer Neg Hx    Social History   Socioeconomic History   Marital status: Widowed    Spouse name: Not on file   Number of children: 3   Years of education: Not on file   Highest education level: Not on file  Occupational History   Occupation: Higher education careers adviser     Comment: retired   Occupation: retired  Tobacco Use   Smoking status: Former    Current packs/day: 0.00    Average packs/day: 1 pack/day for 20.0 years (20.0 ttl pk-yrs)    Types: Cigarettes    Start date: 37    Quit date: Dec 11, 2010    Years since quitting: 13.5   Smokeless tobacco: Never  Vaping Use   Vaping status: Never Used  Substance and Sexual Activity   Alcohol use: No   Drug use: No   Sexual activity: Not Currently    Birth control/protection: Post-menopausal  Other Topics Concern   Not on file  Social History Narrative   Not on file   Social Drivers of Health   Financial Resource Strain: Low Risk  (12/06/2023)   Overall Financial Resource Strain (CARDIA)  Difficulty of Paying Living Expenses: Not hard at all  Food Insecurity: No Food Insecurity (12/06/2023)   Hunger Vital Sign    Worried About Running Out of Food in the Last Year: Never true    Ran Out of Food in the Last Year: Never true  Transportation Needs: No Transportation Needs (12/06/2023)   PRAPARE - Administrator, Civil Service (Medical): No    Lack of Transportation (Non-Medical): No  Physical Activity: Sufficiently Active (12/06/2023)   Exercise Vital Sign    Days of Exercise per Week: 5 days    Minutes of Exercise per Session: 30 min  Stress: No Stress Concern Present (12/06/2023)   [employer] of Occupational Health - Occupational Stress Questionnaire    Feeling of Stress: Only a little  Social Connections: Moderately Isolated (12/06/2023)   Social Connection and Isolation Panel    Frequency of Communication with Friends and Family: More than three times a week    Frequency of Social Gatherings with Friends and Family: More than three times a week    Attends Religious Services: More than 4 times per year    Active Member of [employer] or Organizations: No    Attends Banker Meetings: Never    Marital Status: Widowed    Tobacco Counseling Counseling given: Not  Answered    Clinical Intake:  Pre-visit preparation completed: Yes  Pain : No/denies pain     BMI - recorded: 30.21 Nutritional Status: BMI > 30  Obese Nutritional Risks: None Diabetes: Yes CBG done?: No Did pt. bring in CBG monitor from home?: No  Lab Results  Component Value Date   HGBA1C 6.2 01/30/2023   HGBA1C 6.1 07/30/2022   HGBA1C 6.7 (H) 01/17/2022     How often do you need to have someone help you when you read instructions, pamphlets, or other written materials from your doctor or pharmacy?: 1 - Never  Interpreter Needed?: No  Information entered by :: R. Mileidy Atkin LPN   Activities of Daily Living     12/06/2023   11:22 AM 08/19/2023   12:00 PM  In your present state of health, do you have any difficulty performing the following activities:  Hearing? 1 0  Vision? 0 0  Comment glasses   Difficulty concentrating or making decisions? 0 0  Walking or climbing stairs? 1   Dressing or bathing? 0   Doing errands, shopping? 1 0  Preparing Food and eating ? N   Using the Toilet? N   In the past six months, have you accidently leaked urine? Y   Do you have problems with loss of bowel control? Y   Managing your Medications? N   Managing your Finances? Y   Housekeeping or managing your Housekeeping? N     Patient Care Team: Zehr, Jessica D, PA-C as Physician Assistant (Gastroenterology) Dennise Capri, MD as Consulting Physician (Nephrology)  I have updated your Care Teams any recent Medical Services you may have received from other providers in the past year.     Assessment:   This is a routine wellness examination for [place].  Hearing/Vision screen Hearing Screening - Comments:: Some difficulty no aids Vision Screening - Comments:: glasses   Goals Addressed             This Visit's Progress    Patient Stated       Wants to continue to walk       Depression Screen     12/06/2023   11:32  AM 01/30/2023    4:01 PM 12/04/2022    1:37 PM 07/30/2022     4:14 PM 03/16/2022    3:28 PM 11/30/2021    1:44 PM 07/17/2021    1:27 PM  PHQ 2/9 Scores  PHQ - 2 Score 2 0 2 1 0 0 0  PHQ- 9 Score 5 0 4 2     Exception Documentation   Patient refusal        Fall Risk     12/06/2023   11:26 AM 09/16/2023    1:44 PM 01/30/2023    4:01 PM 12/10/2022   12:14 PM 12/04/2022    1:40 PM  Fall Risk   Falls in the past year? 1 0 0 1 1  Number falls in past yr: 0 0 0 0 0  Injury with Fall? 0 0 0 0 0  Risk for fall due to : History of fall(s);Impaired balance/gait Impaired balance/gait No Fall Risks History of fall(s) Impaired balance/gait;History of fall(s)  Follow up Falls evaluation completed;Falls prevention discussed Falls evaluation completed Falls evaluation completed Falls evaluation completed Falls prevention discussed;Falls evaluation completed    MEDICARE RISK AT HOME:  Medicare Risk at Home Any stairs in or around the home?: No If so, are there any without handrails?: No Home free of loose throw rugs in walkways, pet beds, electrical cords, etc?: Yes Adequate lighting in your home to reduce risk of falls?: Yes Life alert?: No Use of a cane, walker or w/c?: Yes Grab bars in the bathroom?: Yes Shower chair or bench in shower?: Yes Elevated toilet seat or a handicapped toilet?: Yes  TIMED UP AND GO:  Was the test performed?  No  Cognitive Function: 6CIT completed        12/06/2023   11:38 AM 12/04/2022    1:41 PM 11/30/2021    2:08 PM 11/24/2019   11:45 AM 11/20/2018   11:54 AM  6CIT Screen  What Year? 0 points 0 points 0 points 0 points 0 points  What month? 0 points 0 points 0 points 0 points 0 points  What time? 0 points 0 points 0 points  0 points  Count back from 20 0 points 0 points 0 points 0 points 0 points  Months in reverse 0 points 0 points 0 points 0 points 0 points  Repeat phrase 0 points 0 points 0 points 0 points 0 points  Total Score 0 points 0 points 0 points  0 points    Immunizations Immunization History   Administered Date(s) Administered   Fluad Quad(high Dose 65+) 02/26/2019, 01/27/2020, 07/17/2021, 03/16/2022   Influenza, High Dose Seasonal PF 02/19/2018   PFIZER(Purple Top)SARS-COV-2 Vaccination 07/11/2019, 08/04/2019, 03/22/2020   Pfizer Covid-19 Vaccine Bivalent Booster 7yrs & up 02/02/2021   Pneumococcal Conjugate-13 10/30/2017   Pneumococcal Polysaccharide-23 10/02/2016   Tdap 02/02/2021    Screening Tests Health Maintenance  Topic Date Due   Diabetic kidney evaluation - Urine ACR  Never done   Lung Cancer Screening  Never done   Zoster Vaccines- Shingrix (1 of 2) Never done   COVID-19 Vaccine (5 - 2024-25 season) 01/20/2023   HEMOGLOBIN A1C  07/30/2023   INFLUENZA VACCINE  12/20/2023   OPHTHALMOLOGY EXAM  02/14/2024   Diabetic kidney evaluation - eGFR measurement  09/15/2024   FOOT EXAM  11/17/2024   Medicare Annual Wellness (AWV)  12/05/2024   DTaP/Tdap/Td (2 - Td or Tdap) 02/03/2031   Pneumococcal Vaccine: 50+ Years  Completed   DEXA SCAN  Completed  Hepatitis B Vaccines  Aged Out   HPV VACCINES  Aged Out   Meningococcal B Vaccine  Aged Out   Fecal DNA (Cologuard)  Discontinued    Health Maintenance  Health Maintenance Due  Topic Date Due   Diabetic kidney evaluation - Urine ACR  Never done   Lung Cancer Screening  Never done   Zoster Vaccines- Shingrix (1 of 2) Never done   COVID-19 Vaccine (5 - 2024-25 season) 01/20/2023   HEMOGLOBIN A1C  07/30/2023   Health Maintenance Items Addressed: Discussed the need to update covid and shingles vaccines. Patient declines lung cancer screening and Dexa.  Patient needs A1c at upcoming office visit.   Additional Screening:  Vision Screening: Recommended annual ophthalmology exams for early detection of glaucoma and other disorders of the eye. Up to date [facility] Would you like a referral to an eye doctor? No    Dental Screening: Recommended annual dental exams for proper oral hygiene  Community Resource  Referral / Chronic Care Management: CRR required this visit?  No   CCM required this visit?  No   Plan:    I have personally reviewed and noted the following in the patient's chart:   Medical and social history Use of alcohol, tobacco or illicit drugs  Current medications and supplements including opioid prescriptions. Patient is not currently taking opioid prescriptions. Functional ability and status Nutritional status Physical activity Advanced directives List of other physicians Hospitalizations, surgeries, and ER visits in previous 12 months Vitals Screenings to include cognitive, depression, and falls Referrals and appointments  In addition, I have reviewed and discussed with patient certain preventive protocols, quality metrics, and best practice recommendations. A written personalized care plan for preventive services as well as general preventive health recommendations were provided to patient.   Angeline Fredericks, LPN   [id number]   After Visit Summary: (Pick Up) Due to this being a telephonic visit, with patients personalized plan was offered to patient and patient has requested to Pick up at office.  Notes: Nothing significant to report at this time.

## 2023-12-06 NOTE — Patient Instructions (Signed)
 Crystal Miller , Thank you for taking time out of your busy schedule to complete your Annual Wellness Visit with me. I enjoyed our conversation and look forward to speaking with you again next year. I, as well as your care team,  appreciate your ongoing commitment to your health goals. Please review the following plan we discussed and let me know if I can assist you in the future. Your Game plan/ To Do List    Referrals: If you haven't heard from the office you've been referred to, please reach out to them at the phone provided.  Remember to update your shingles and covid vaccines at your pharmacy.  Follow up Visits: Next Medicare AWV with our clinical staff: 12/08/24 @ 2:00   Have you seen your provider in the last 6 months (3 months if uncontrolled diabetes)? Yes Next Office Visit with your provider: 01/31/24  Clinician Recommendations:  Aim for 30 minutes of exercise or brisk walking, 6-8 glasses of water, and 5 servings of fruits and vegetables each day.       This is a list of the screening recommended for you and due dates:  Health Maintenance  Topic Date Due   Yearly kidney health urinalysis for diabetes  Never done   Screening for Lung Cancer  Never done   Zoster (Shingles) Vaccine (1 of 2) Never done   COVID-19 Vaccine (5 - 2024-25 season) 01/20/2023   Hemoglobin A1C  07/30/2023   Flu Shot  12/20/2023   Eye exam for diabetics  02/14/2024   Yearly kidney function blood test for diabetes  09/15/2024   Complete foot exam   11/17/2024   Medicare Annual Wellness Visit  12/05/2024   DTaP/Tdap/Td vaccine (2 - Td or Tdap) 02/03/2031   Pneumococcal Vaccine for age over 76  Completed   DEXA scan (bone density measurement)  Completed   Hepatitis B Vaccine  Aged Out   HPV Vaccine  Aged Out   Meningitis B Vaccine  Aged Out   Cologuard (Stool DNA test)  Discontinued    Advanced directives: (Declined) Advance directive discussed with you today. Even though you declined this today, please call  our office should you change your mind, and we can give you the proper paperwork for you to fill out. Advance Care Planning is important because it:  [x]  Makes sure you receive the medical care that is consistent with your values, goals, and preferences  [x]  It provides guidance to your family and loved ones and reduces their decisional burden about whether or not they are making the right decisions based on your wishes.

## 2024-01-06 ENCOUNTER — Other Ambulatory Visit: Payer: Self-pay

## 2024-01-06 DIAGNOSIS — F330 Major depressive disorder, recurrent, mild: Secondary | ICD-10-CM

## 2024-01-06 MED ORDER — SERTRALINE HCL 25 MG PO TABS: 25.0000 mg | ORAL_TABLET | Freq: Every day | ORAL | 0 refills | Status: DC

## 2024-01-11 ENCOUNTER — Other Ambulatory Visit: Payer: Self-pay | Admitting: Nurse Practitioner

## 2024-01-11 DIAGNOSIS — E118 Type 2 diabetes mellitus with unspecified complications: Secondary | ICD-10-CM

## 2024-01-31 ENCOUNTER — Ambulatory Visit: Payer: Self-pay

## 2024-01-31 ENCOUNTER — Ambulatory Visit

## 2024-01-31 VITALS — BP 120/78 | HR 69 | Temp 97.8°F | Ht <= 58 in | Wt 139.0 lb

## 2024-01-31 DIAGNOSIS — N1832 Chronic kidney disease, stage 3b: Secondary | ICD-10-CM | POA: Insufficient documentation

## 2024-01-31 DIAGNOSIS — E038 Other specified hypothyroidism: Secondary | ICD-10-CM

## 2024-01-31 DIAGNOSIS — E118 Type 2 diabetes mellitus with unspecified complications: Secondary | ICD-10-CM

## 2024-01-31 DIAGNOSIS — Z8673 Personal history of transient ischemic attack (TIA), and cerebral infarction without residual deficits: Secondary | ICD-10-CM

## 2024-01-31 DIAGNOSIS — E782 Mixed hyperlipidemia: Secondary | ICD-10-CM | POA: Diagnosis not present

## 2024-01-31 DIAGNOSIS — N3281 Overactive bladder: Secondary | ICD-10-CM

## 2024-01-31 DIAGNOSIS — F39 Unspecified mood [affective] disorder: Secondary | ICD-10-CM

## 2024-01-31 DIAGNOSIS — M545 Low back pain, unspecified: Secondary | ICD-10-CM

## 2024-01-31 DIAGNOSIS — D649 Anemia, unspecified: Secondary | ICD-10-CM

## 2024-01-31 DIAGNOSIS — E669 Obesity, unspecified: Secondary | ICD-10-CM | POA: Diagnosis not present

## 2024-01-31 DIAGNOSIS — Z23 Encounter for immunization: Secondary | ICD-10-CM | POA: Insufficient documentation

## 2024-01-31 DIAGNOSIS — Z79899 Other long term (current) drug therapy: Secondary | ICD-10-CM | POA: Diagnosis not present

## 2024-01-31 DIAGNOSIS — D72829 Elevated white blood cell count, unspecified: Secondary | ICD-10-CM

## 2024-01-31 DIAGNOSIS — I10 Essential (primary) hypertension: Secondary | ICD-10-CM | POA: Diagnosis not present

## 2024-01-31 DIAGNOSIS — G8929 Other chronic pain: Secondary | ICD-10-CM | POA: Diagnosis not present

## 2024-01-31 DIAGNOSIS — F330 Major depressive disorder, recurrent, mild: Secondary | ICD-10-CM

## 2024-01-31 DIAGNOSIS — E11319 Type 2 diabetes mellitus with unspecified diabetic retinopathy without macular edema: Secondary | ICD-10-CM

## 2024-01-31 LAB — LIPID PANEL
Cholesterol: 96 mg/dL (ref 0–200)
HDL: 34.4 mg/dL — ABNORMAL LOW (ref >39.00)
LDL Cholesterol: 34 mg/dL (ref 0–99)
NonHDL: 61.29
Total CHOL/HDL Ratio: 3
Triglycerides: 138 mg/dL (ref 0.0–149.0)
VLDL: 27.6 mg/dL (ref 0.0–40.0)

## 2024-01-31 LAB — HEMOGLOBIN A1C: Hgb A1c MFr Bld: 6.1 % (ref 4.6–6.5)

## 2024-01-31 LAB — TSH: TSH: 5.8 u[IU]/mL — ABNORMAL HIGH (ref 0.35–5.50)

## 2024-01-31 LAB — VITAMIN D 25 HYDROXY (VIT D DEFICIENCY, FRACTURES): VITD: 39.13 ng/mL (ref 30.00–100.00)

## 2024-01-31 LAB — VITAMIN B12: Vitamin B-12: 430 pg/mL (ref 211–911)

## 2024-01-31 MED ORDER — TRULICITY 0.75 MG/0.5ML ~~LOC~~ SOAJ: 0.7500 mg | SUBCUTANEOUS | 1 refills | Status: AC

## 2024-01-31 MED ORDER — AMLODIPINE BESYLATE 10 MG PO TABS
10.0000 mg | ORAL_TABLET | Freq: Every day | ORAL | 3 refills | Status: AC
Start: 2024-01-31 — End: ?

## 2024-01-31 MED ORDER — CARVEDILOL 6.25 MG PO TABS: 6.2500 mg | ORAL_TABLET | Freq: Two times a day (BID) | ORAL | 3 refills | Status: AC

## 2024-01-31 MED ORDER — COVID-19 MRNA VACC (MODERNA) 50 MCG/0.5ML IM SUSY: 0.5000 mL | PREFILLED_SYRINGE | Freq: Once | INTRAMUSCULAR | 0 refills | Status: AC

## 2024-01-31 MED ORDER — ATORVASTATIN CALCIUM 40 MG PO TABS: ORAL_TABLET | ORAL | 3 refills | Status: AC

## 2024-01-31 MED ORDER — SERTRALINE HCL 25 MG PO TABS
25.0000 mg | ORAL_TABLET | Freq: Every day | ORAL | 3 refills | Status: AC
Start: 2024-01-31 — End: ?

## 2024-01-31 NOTE — Assessment & Plan Note (Signed)
 Chronic, BP within goal today.  Continue Amlodipine  10 mg daily, Coreg  6.125 twice a day. Due to h/o hyperkalemia she is not a candidate for ACEI/ARB.  Patient will continue to f/u with nephrologist Dr. Dennise.

## 2024-01-31 NOTE — Telephone Encounter (Signed)
 Copied from CRM #[id number]. Topic: Clinical - Lab/Test Results >> Jan 31, 2024  4:14 PM [place] J wrote: Reason for CRM: Patient calling for lab results. She acknowledged results and has no further questions.

## 2024-01-31 NOTE — Assessment & Plan Note (Signed)
 On Prilosec for GERD Check B12 level

## 2024-01-31 NOTE — Assessment & Plan Note (Signed)
 In 2018, continue Lipitor 40 mg daily, Aspirin  81 mg daily. Risk factor management including good BP control, lipid management discussed.

## 2024-01-31 NOTE — Assessment & Plan Note (Signed)
 Suspected CKD from DM, HTN.  Under nephrologist care. Continue current management and follow-up with nephrologist. Avoid NSAIDs and certain blood pressure medications.

## 2024-01-31 NOTE — Assessment & Plan Note (Signed)
 Patient asymptomatic. Check TSH.

## 2024-01-31 NOTE — Patient Instructions (Addendum)
--   You can try Voltaren gel, up to 4 times a day as needed when back pain is bad. It is okay for you to take Tylenol  when Voltaren does not help with the lower back pain. I would limit daily Tylenol  to less than 2gm per day. You can also try heating pad when back pain is bad. In the future if it gets worse please let me know and we can reevaluate you, consider physical therapy for it.   -- I recommend updating your COVID-19, shingles, influenza vaccine. I have ordered COVID-19 vaccine to your pharmacy. You can also update your influenza vaccine at your pharmacy.  You can also update shingles vaccine through your local pharmacy. I would recommend getting shingles vaccine separate from Covid-19 and influenza.   -- Follow up with Dr. Abbey in 4 months, sooner if you need me.

## 2024-01-31 NOTE — Assessment & Plan Note (Signed)
 Chronic, stable mood on Zoloft  25 mg daily, continue.

## 2024-01-31 NOTE — Progress Notes (Signed)
 Established Patient Office Visit TOC from Dr. Maribeth    Subjective  Patient ID: Crystal Miller, female    DOB: 1946-01-09  Age: 78 y.o. MRN: [id number]  Chief Complaint  Patient presents with   Establish Care   Fatigue    Patient says she was hospitalized earlier this year for Sepsis and E.Coli. Patient states since then she has been fatigue more and tired more than usual.    Mija  has a past medical history of Acute pyelonephritis (08/01/2018), C. 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  HPI Discussed the use of AI scribe software for clinical note transcription with the patient, who gave verbal consent to proceed.  History of Present Illness Patient is a pleasant 78 year old female presenting for transfer of care from previous PCP and chronic medication follow up.   - She qualifies for COVID-19, influenza, shingles immunization. She relies on transport and would like to get these update through local pharmacy. She has a h/o smoking for more than 20 years, however she smoked one pack per week so she does not qualify for low dose lung cancer screening. She quit smoking in 02-13-2011.   - She was hospitalized for GAS pharyngitis induced sepsis in March 2025. She reports she has been slowly regaining energy since being discharged from the hospital. She denies cough, wheezing, new urinary symptoms, diarrhea. She stays active, walks daily. She lives with her daughter in an apartment complex. She enjoys cooking.   - She has a h/o chronic low back  pain, b/l knee pain, which worsens with prolonged walking.  She uses Tylenol  twice a week for pain management. She also use Voltaren gel on her knees.  She has a history of a stroke, which affected their balance, and usees a walker. She has been through physical therapy in the past for balance. She is not interested to go to PT for back pain.   - She has a h/o type II DM with complications including CKD IIIb, proteinuria, hyperlipidemia, hypertension, h/o CVA, anemia of chronic disease: Currently she is on Trulicity : 0.75 once weekly injectable for DM management, treatment on Fridays. Tolerating well.  On Lipitor 40 mg daily, no new concerns.  Takes Amlodipine  10 mg daily, Coreg  6.25 BID and Aspirin  81 mg She is established with neurologist Dr. Dennise at [facility], last visit was on 10/10/23.  Urine microalbumin was checked on 09/30/23.  Due to h/o urinary incontinence she is not a candidate for SGLT-2.  Due to h/o hyperkalemia she is not a candidate for ACEI/ARB.  She takes Oxybutynin  (previously prescribed by urology but now refill through her nephrologist daily). Has not tried pelvic floor therapy. Has a mixed urinary incontinence symptoms, worsened since stroke in 02/12/17.   - She has a h/o GERD and takes Prilosec 20 mg daily. Symptoms stable on it.    - MDD: She has been on Zoloft  25 mg once daily since her husband's death in February 13, 2016.  describe feeling very depressed at that time and note that stopping Zoloft  in the past led to noticeable differences in her mood. She denies SI/HI.   ROS As per HPI    Objective:     BP 120/78 (BP Location: Right  Arm, Patient Position: Sitting, Cuff Size: Normal)   Pulse 69   Temp 97.8 F (36.6 C) (Oral)   Ht 4' 8 (1.422 m)   Wt 139 lb (63 kg)   SpO2 97%   BMI 31.16 kg/m      01/31/2024    9:45 AM 12/06/2023   11:32 AM 01/30/2023    4:01 PM  Depression screen PHQ 2/9  Decreased Interest 1 0 0  Down, Depressed, Hopeless 2 2 0  PHQ - 2 Score 3  2 0  Altered sleeping 1 1 0  Tired, decreased energy 2 2 0  Change in appetite 0 0 0  Feeling bad or failure about yourself  1 0 0  Trouble concentrating 0 0 0  Moving slowly or fidgety/restless 0 0 0  Suicidal thoughts 0 0 0  PHQ-9 Score 7 5 0  Difficult doing work/chores Somewhat difficult Somewhat difficult Not difficult at all      01/31/2024    9:45 AM 01/30/2023    4:01 PM 07/30/2022    4:14 PM 06/03/2018   10:22 AM  GAD 7 : Generalized Anxiety Score  Nervous, Anxious, on Edge 0 0 0 0  Control/stop worrying 0 0 0 0  Worry too much - different things 0 0 0 1  Trouble relaxing 0 0 0 0  Restless 0 0 0 0  Easily annoyed or irritable 0 0 0 0  Afraid - awful might happen 0 0 0 1  Total GAD 7 Score 0 0 0 2  Anxiety Difficulty Not difficult at all Not difficult at all Not difficult at all       01/31/2024    9:45 AM 12/06/2023   11:32 AM 01/30/2023    4:01 PM  Depression screen PHQ 2/9  Decreased Interest 1 0 0  Down, Depressed, Hopeless 2 2 0  PHQ - 2 Score 3 2 0  Altered sleeping 1 1 0  Tired, decreased energy 2 2 0  Change in appetite 0 0 0  Feeling bad or failure about yourself  1 0 0  Trouble concentrating 0 0 0  Moving slowly or fidgety/restless 0 0 0  Suicidal thoughts 0 0 0  PHQ-9 Score 7 5 0  Difficult doing work/chores Somewhat difficult Somewhat difficult Not difficult at all      01/31/2024    9:45 AM 01/30/2023    4:01 PM 07/30/2022    4:14 PM 06/03/2018   10:22 AM  GAD 7 : Generalized Anxiety Score  Nervous, Anxious, on Edge 0 0 0 0  Control/stop worrying 0 0 0 0  Worry too much - different things 0 0 0 1  Trouble relaxing 0 0 0 0  Restless 0 0 0 0  Easily annoyed or irritable 0 0 0 0  Afraid - awful might happen 0 0 0 1  Total GAD 7 Score 0 0 0 2  Anxiety Difficulty Not difficult at all Not difficult at all Not difficult at all    SDOH Screenings   Food Insecurity: No Food Insecurity (12/06/2023)  Housing: Unknown (12/06/2023)  Transportation Needs:  No Transportation Needs (12/06/2023)  Utilities: Not At Risk (12/06/2023)  Alcohol Screen: Low Risk  (12/06/2023)  Depression (PHQ2-9): Medium Risk (01/31/2024)  Financial Resource Strain: Low Risk  (12/06/2023)  Physical Activity: Sufficiently Active (12/06/2023)  Social Connections: Moderately Isolated (12/06/2023)  Stress: No Stress Concern Present (12/06/2023)  Tobacco Use: Medium Risk (01/31/2024)  Health Literacy: Adequate Health Literacy (12/06/2023)  Physical Exam HENT:     Head: Normocephalic and atraumatic.     Left Ear: Tympanic membrane normal.     Nose: Nose normal.  Cardiovascular:     Rate and Rhythm: Normal rate.  Pulmonary:     Effort: Pulmonary effort is normal.     Breath sounds: Normal breath sounds.  Abdominal:     General: Bowel sounds are normal.     Palpations: Abdomen is soft.     Tenderness: There is no abdominal tenderness. There is no guarding.  Musculoskeletal:     Cervical back: Neck supple. No rigidity.     Right lower leg: No edema.     Left lower leg: No edema.  Skin:    General: Skin is warm.  Neurological:     Mental Status: Crystal Miller is alert and oriented to person, place, and time.     Gait: Gait abnormal (slow, stooped gait, using walker for ambulation).  Psychiatric:        Mood and Affect: Mood normal.        No results found for any visits on 01/31/24.  The ASCVD Risk score (Arnett DK, et al., 2019) failed to calculate for the following reasons:   Risk score cannot be calculated because patient has a medical history suggesting prior/existing ASCVD     Assessment & Plan:  Patient is a pleasant 78 year old female presenting for TOC from Dr. Maribeth and follow up on multiple chronic concerns.  DM (diabetes mellitus), type 2 with complications [facility]) Assessment & Plan: Chronic,  Check A1c. Continue Trulicity  0.75 mg weekly. Consider increasing dose if A1c above goal.  F/U with  nephrologist for diabetic nephropathy with CKD. Not on  ACEI/ARB due to h/o hyperkalemia, not on SGLT 2 due to urinary incontinence. Recommend updating annual influenza, COVID-19 immunization and shingles vaccine. Patient relies on transport and plans of getting the vaccine updated through local pharmacy.    Orders: -     Hemoglobin A1c -     Lipid panel -     Trulicity ; Inject 0.75 mg into the skin once a week.  Dispense: 6 mL; Refill: 1  Essential hypertension Assessment & Plan: Chronic, BP within goal today.  Continue Amlodipine  10 mg daily, Coreg  6.125 twice a day. Due to h/o hyperkalemia she is not a candidate for ACEI/ARB.  Patient will continue to f/u with nephrologist Dr. Dennise.   Orders: -     amLODIPine  Besylate; Take 1 tablet (10 mg total) by mouth daily.  Dispense: 90 tablet; Refill: 3 -     Lipid panel  Mixed hyperlipidemia Assessment & Plan: Tolerating Lipitor 40 mg daily, continue. Check lipid panel today.   Orders: -     Atorvastatin  Calcium ; TAKE 1 TABLET BY MOUTH ONCE DAILY AT  6PM  Dispense: 90 tablet; Refill: 3 -     Lipid panel  Subclinical hypothyroidism Assessment & Plan: Patient asymptomatic. Check TSH.  Orders: -     TSH  Need for COVID-19 vaccine Assessment & Plan: Risk factors:Age, h/o smoking, DM, CKD, h/o CAD, anemia of chronic disease.   Recommend updating COVID-19 vaccine, patient agreeable. Prescription sent to the pharmacy.  Orders: -     COVID-19 mRNA Vacc (Moderna); Inject 0.5 mLs into the muscle once for 1 dose.  Dispense: 0.5 mL; Refill: 0  Obesity (BMI 30-39.9) Assessment & Plan: Discussed continuing activities as tolerated, eating balanced diet. Check vitamin D  level today.   Orders: -     VITAMIN  D 25 Hydroxy (Vit-D Deficiency, Fractures)  Medication management Assessment & Plan: On Prilosec for GERD Check B12 level   Orders: -     Vitamin B12  Stage 3b chronic kidney disease (HCC) Assessment & Plan: Suspected CKD from DM, HTN.  Under nephrologist care. Continue current  management and follow-up with nephrologist. Avoid NSAIDs and certain blood pressure medications.    Diabetic retinopathy associated with type 2 diabetes mellitus, macular edema presence unspecified, unspecified laterality, unspecified retinopathy severity (HCC)  Normocytic anemia Assessment & Plan: Chronic, likely anemia of chronic disease. Reviewed CBC from 09/30/23, 10.9, will continue to monitor it periodically.   Mood disorder (HCC) Assessment & Plan: Chronic, stable mood on Zoloft  25 mg daily, continue.  Orders: -     Sertraline  HCl; Take 1 tablet (25 mg total) by mouth daily.  Dispense: 90 tablet; Refill: 3  History of stroke Assessment & Plan: In 2018, continue Lipitor 40 mg daily, Aspirin  81 mg daily. Risk factor management including good BP control, lipid management discussed.   Overactive bladder Assessment & Plan: Patient will monitor her symptoms on the oxybutynin  which is prescribed by her nephrologist. Also recommend Pelvic floor exercise, patient agreeable. Provide pelvic floor exercises to strengthen muscles.   Chronic bilateral low back pain without sciatica Assessment & Plan: Chronic low back pain exacerbated by stooping, using a walker.  Recommend Voltaren gel on the back up to four times daily as needed for pain. Advise Tylenol  when Voltaren is insufficient, limiting to less than 2 grams per day. Suggest use of a heating pad for back pain. Consider physical therapy if pain worsen   Other orders -     Carvedilol ; Take 1 tablet (6.25 mg total) by mouth 2 (two) times daily with a meal.  Dispense: 180 tablet; Refill: 3   I personally spent a total of 60 minutes in the care of the patient today including preparing to see the patient, getting/reviewing separately obtained history, performing a medically appropriate exam/evaluation, counseling and educating, placing orders, documenting clinical information in the EHR, independently interpreting results, and  communicating results.  Return in about 4 months (around 06/01/2024) for Chronic follow up.   Luke Shade, MD

## 2024-01-31 NOTE — Assessment & Plan Note (Signed)
 Chronic,  Check A1c. Continue Trulicity  0.75 mg weekly. Consider increasing dose if A1c above goal.  F/U with  nephrologist for diabetic nephropathy with CKD. Not on ACEI/ARB due to h/o hyperkalemia, not on SGLT 2 due to urinary incontinence. Recommend updating annual influenza, COVID-19 immunization and shingles vaccine. Patient relies on transport and plans of getting the vaccine updated through local pharmacy.

## 2024-01-31 NOTE — Assessment & Plan Note (Signed)
 Discussed continuing activities as tolerated, eating balanced diet. Check vitamin D  level today.

## 2024-01-31 NOTE — Progress Notes (Signed)
 Please let the patient know her vitamin B 12, vitamin D  looks normal. A1c, lipid panel is stable. Her TSH is slightly elevated and I recommend we repeat this lab during her 4 months follow up.   Thank you,  Luke Shade, MD

## 2024-01-31 NOTE — Assessment & Plan Note (Signed)
 Chronic, likely anemia of chronic disease. Reviewed CBC from 09/30/23, 10.9, will continue to monitor it periodically.

## 2024-01-31 NOTE — Assessment & Plan Note (Signed)
 Chronic low back pain exacerbated by stooping, using a walker.  Recommend Voltaren gel on the back up to four times daily as needed for pain. Advise Tylenol  when Voltaren is insufficient, limiting to less than 2 grams per day. Suggest use of a heating pad for back pain. Consider physical therapy if pain worsen

## 2024-01-31 NOTE — Assessment & Plan Note (Signed)
 Risk factors:Age, h/o smoking, DM, CKD, h/o CAD, anemia of chronic disease.   Recommend updating COVID-19 vaccine, patient agreeable. Prescription sent to the pharmacy.

## 2024-01-31 NOTE — Assessment & Plan Note (Signed)
 Tolerating Lipitor 40 mg daily, continue. Check lipid panel today.

## 2024-01-31 NOTE — Assessment & Plan Note (Signed)
 Patient will monitor her symptoms on the oxybutynin  which is prescribed by her nephrologist. Also recommend Pelvic floor exercise, patient agreeable. Provide pelvic floor exercises to strengthen muscles.

## 2024-02-03 ENCOUNTER — Telehealth: Payer: Self-pay

## 2024-02-03 NOTE — Telephone Encounter (Signed)
 I signed the form. Can we please send a copy for scanning as well.   Thank you,  Luke Shade, MD

## 2024-02-03 NOTE — Telephone Encounter (Signed)
 Daughter dropped off disability parking placard renewal. It is in Dr Graylon color folder up front.

## 2024-02-17 DIAGNOSIS — H40003 Preglaucoma, unspecified, bilateral: Secondary | ICD-10-CM | POA: Diagnosis not present

## 2024-02-17 DIAGNOSIS — H35373 Puckering of macula, bilateral: Secondary | ICD-10-CM | POA: Diagnosis not present

## 2024-02-17 DIAGNOSIS — H2513 Age-related nuclear cataract, bilateral: Secondary | ICD-10-CM | POA: Diagnosis not present

## 2024-02-24 ENCOUNTER — Ambulatory Visit: Admitting: Podiatry

## 2024-02-24 DIAGNOSIS — H2511 Age-related nuclear cataract, right eye: Secondary | ICD-10-CM | POA: Diagnosis not present

## 2024-02-24 DIAGNOSIS — H2513 Age-related nuclear cataract, bilateral: Secondary | ICD-10-CM | POA: Diagnosis not present

## 2024-02-26 ENCOUNTER — Encounter: Payer: Self-pay | Admitting: Ophthalmology

## 2024-02-26 NOTE — Anesthesia Preprocedure Evaluation (Addendum)
 Anesthesia Evaluation  Patient identified by MRN, date of birth, ID band Patient awake    Reviewed: Allergy & Precautions, H&P , NPO status , Patient's Chart, lab work & pertinent test results  Airway Mallampati: III  TM Distance: <3 FB Neck ROM: Full    Dental no notable dental hx. (+) Upper Dentures   Pulmonary former smoker   Pulmonary exam normal breath sounds clear to auscultation       Cardiovascular hypertension, + Peripheral Vascular Disease  Normal cardiovascular exam Rhythm:Regular Rate:Normal  09-22-16 echo Left ventricle: The cavity size was normal. Wall thickness was    increased in a pattern of mild LVH. There was focal basal    hypertrophy of the septum. Systolic function was vigorous. The    estimated ejection fraction was in the range of 65% to 70%. Wall    motion was normal; there were no regional wall motion    abnormalities. Doppler parameters are consistent with abnormal    left ventricular relaxation (grade 1 diastolic dysfunction).    Doppler parameters are consistent with high ventricular filling    pressure.  - Mitral valve: Mildly calcified annulus. There was mild    regurgitation.  - Left atrium: The atrium was mildly dilated.  - Right ventricle: The cavity size was normal. Systolic function    was normal.  - Tricuspid valve: There was mild regurgitation.     Neuro/Psych  PSYCHIATRIC DISORDERS  Depression    CVA    GI/Hepatic Neg liver ROS,GERD  ,,  Endo/Other  diabetesHypothyroidism    Renal/GU Renal disease  negative genitourinary   Musculoskeletal negative musculoskeletal ROS (+)    Abdominal   Peds negative pediatric ROS (+)  Hematology  (+) Blood dyscrasia, anemia   Anesthesia Other Findings Essential hypertension  DM (diabetes mellitus), type 2 with complications (HCC) Mixed hyperlipidemia  Peripheral vascular disease GERD (gastroesophageal reflux  disease) Depression Stroke (HCC) 03-27-17 Renal insufficiency Acute pyelonephritis  Right pontine stroke 03-27-17 slight residual with speech, does not drive, left side weakness Kidney stones  Transaminitis Sepsis (HCC)  Pharyngitis CVA (cerebral vascular accident)  Clostridium difficile colitis Type 2 diabetes mellitus with diabetic chronic kidney disease   Stage 3b chronic kidney disease  Anemia in chronic kidney disease  Mild mitral regurgitation by prior echocardiogram Mild tricuspid regurgitation by prior echocardiogram  Grade I diastolic dysfunction     Reproductive/Obstetrics negative OB ROS                              Anesthesia Physical Anesthesia Plan  ASA: 3  Anesthesia Plan: MAC   Post-op Pain Management:    Induction: Intravenous  PONV Risk Score and Plan:   Airway Management Planned: Natural Airway and Nasal Cannula  Additional Equipment:   Intra-op Plan:   Post-operative Plan:   Informed Consent: I have reviewed the patients History and Physical, chart, labs and discussed the procedure including the risks, benefits and alternatives for the proposed anesthesia with the patient or authorized representative who has indicated his/her understanding and acceptance.     Dental Advisory Given  Plan Discussed with: Anesthesiologist, CRNA and Surgeon  Anesthesia Plan Comments: (Patient consented for risks of anesthesia including but not limited to:  - adverse reactions to medications - damage to eyes, teeth, lips or other oral mucosa - nerve damage due to positioning  - sore throat or hoarseness - Damage to heart, brain, nerves, lungs, other parts of body  or loss of life  Patient voiced understanding and assent.)         Anesthesia Quick Evaluation

## 2024-02-28 NOTE — Discharge Instructions (Signed)

## 2024-03-03 ENCOUNTER — Ambulatory Visit: Payer: Self-pay | Admitting: Anesthesiology

## 2024-03-03 ENCOUNTER — Encounter
Admission: RE | Disposition: A | Discharge status: Home / Self Care | Payer: Self-pay | Source: Home / Self Care | Attending: Ophthalmology

## 2024-03-03 ENCOUNTER — Other Ambulatory Visit: Payer: Self-pay

## 2024-03-03 ENCOUNTER — Ambulatory Visit
Admission: RE | Admit: 2024-03-03 | Discharge: 2024-03-03 | Disposition: A | Discharge status: Home / Self Care | Attending: Ophthalmology | Admitting: Ophthalmology

## 2024-03-03 DIAGNOSIS — F32A Depression, unspecified: Secondary | ICD-10-CM | POA: Insufficient documentation

## 2024-03-03 DIAGNOSIS — E1136 Type 2 diabetes mellitus with diabetic cataract: Secondary | ICD-10-CM | POA: Diagnosis not present

## 2024-03-03 DIAGNOSIS — Z8673 Personal history of transient ischemic attack (TIA), and cerebral infarction without residual deficits: Secondary | ICD-10-CM | POA: Diagnosis not present

## 2024-03-03 DIAGNOSIS — Z87891 Personal history of nicotine dependence: Secondary | ICD-10-CM | POA: Diagnosis not present

## 2024-03-03 DIAGNOSIS — N1832 Chronic kidney disease, stage 3b: Secondary | ICD-10-CM | POA: Insufficient documentation

## 2024-03-03 DIAGNOSIS — E039 Hypothyroidism, unspecified: Secondary | ICD-10-CM | POA: Insufficient documentation

## 2024-03-03 DIAGNOSIS — E782 Mixed hyperlipidemia: Secondary | ICD-10-CM | POA: Diagnosis not present

## 2024-03-03 DIAGNOSIS — D631 Anemia in chronic kidney disease: Secondary | ICD-10-CM | POA: Insufficient documentation

## 2024-03-03 DIAGNOSIS — Z7985 Long-term (current) use of injectable non-insulin antidiabetic drugs: Secondary | ICD-10-CM | POA: Diagnosis not present

## 2024-03-03 DIAGNOSIS — I129 Hypertensive chronic kidney disease with stage 1 through stage 4 chronic kidney disease, or unspecified chronic kidney disease: Secondary | ICD-10-CM | POA: Insufficient documentation

## 2024-03-03 DIAGNOSIS — E1151 Type 2 diabetes mellitus with diabetic peripheral angiopathy without gangrene: Secondary | ICD-10-CM | POA: Diagnosis not present

## 2024-03-03 DIAGNOSIS — E1122 Type 2 diabetes mellitus with diabetic chronic kidney disease: Secondary | ICD-10-CM | POA: Diagnosis not present

## 2024-03-03 DIAGNOSIS — H2511 Age-related nuclear cataract, right eye: Secondary | ICD-10-CM | POA: Diagnosis not present

## 2024-03-03 HISTORY — DX: Rheumatic tricuspid insufficiency: I07.1

## 2024-03-03 HISTORY — DX: Nonrheumatic mitral (valve) insufficiency: I34.0

## 2024-03-03 HISTORY — DX: Other ill-defined heart diseases: I51.89

## 2024-03-03 HISTORY — DX: Chronic kidney disease, stage 3b: N18.32

## 2024-03-03 HISTORY — DX: Type 2 diabetes mellitus with diabetic chronic kidney disease: E11.22

## 2024-03-03 HISTORY — DX: Anemia in chronic kidney disease: D63.1

## 2024-03-03 HISTORY — DX: Anemia in chronic kidney disease: N18.9

## 2024-03-03 HISTORY — PX: CATARACT EXTRACTION W/PHACO: SHX586

## 2024-03-03 LAB — GLUCOSE, CAPILLARY: Glucose-Capillary: 91 mg/dL (ref 70–99)

## 2024-03-03 SURGERY — PHACOEMULSIFICATION, CATARACT, WITH IOL INSERTION
Anesthesia: Monitor Anesthesia Care | Site: Eye | Laterality: Right

## 2024-03-03 MED ORDER — MOXIFLOXACIN HCL 0.5 % OP SOLN
OPHTHALMIC | Status: DC | PRN
  Administered 2024-03-03: .2 mL via OPHTHALMIC

## 2024-03-03 MED ORDER — MIDAZOLAM HCL 2 MG/2ML IJ SOLN
INTRAMUSCULAR | Status: AC
  Filled 2024-03-03: qty 2

## 2024-03-03 MED ORDER — ARMC OPHTHALMIC DILATING DROPS
OPHTHALMIC | Status: AC
  Filled 2024-03-03: qty 0.5

## 2024-03-03 MED ORDER — SIGHTPATH DOSE#1 BSS IO SOLN
INTRAOCULAR | Status: DC | PRN
  Administered 2024-03-03: 15 mL

## 2024-03-03 MED ORDER — SIGHTPATH DOSE#1 NA CHONDROIT SULF-NA HYALURON 40-17 MG/ML IO SOLN
INTRAOCULAR | Status: DC | PRN
  Administered 2024-03-03: 1 mL via INTRAOCULAR

## 2024-03-03 MED ORDER — TETRACAINE HCL 0.5 % OP SOLN
OPHTHALMIC | Status: AC
  Filled 2024-03-03: qty 4

## 2024-03-03 MED ORDER — SIGHTPATH DOSE#1 BSS IO SOLN
INTRAOCULAR | Status: DC | PRN
  Administered 2024-03-03: 75 mL via OPHTHALMIC

## 2024-03-03 MED ORDER — TETRACAINE HCL 0.5 % OP SOLN
1.0000 [drp] | OPHTHALMIC | Status: DC | PRN
  Administered 2024-03-03 (×3): 1 [drp] via OPHTHALMIC

## 2024-03-03 MED ORDER — ARMC OPHTHALMIC DILATING DROPS
1.0000 | OPHTHALMIC | Status: DC | PRN
  Administered 2024-03-03 (×3): 1 via OPHTHALMIC

## 2024-03-03 MED ORDER — FENTANYL CITRATE (PF) 100 MCG/2ML IJ SOLN
INTRAMUSCULAR | Status: DC | PRN
  Administered 2024-03-03: 50 ug via INTRAVENOUS

## 2024-03-03 MED ORDER — LIDOCAINE HCL (PF) 2 % IJ SOLN
INTRAOCULAR | Status: DC | PRN
  Administered 2024-03-03: 1 mL via INTRAMUSCULAR

## 2024-03-03 MED ORDER — MIDAZOLAM HCL 2 MG/2ML IJ SOLN
INTRAMUSCULAR | Status: DC | PRN
  Administered 2024-03-03: 1 mg via INTRAVENOUS

## 2024-03-03 MED ORDER — BRIMONIDINE TARTRATE-TIMOLOL 0.2-0.5 % OP SOLN
OPHTHALMIC | Status: DC | PRN
  Administered 2024-03-03: 1 [drp] via OPHTHALMIC

## 2024-03-03 MED ORDER — LACTATED RINGERS IV SOLN: INTRAVENOUS | Status: DC

## 2024-03-03 MED ORDER — FENTANYL CITRATE (PF) 100 MCG/2ML IJ SOLN
INTRAMUSCULAR | Status: AC
  Filled 2024-03-03: qty 2

## 2024-03-03 SURGICAL SUPPLY — 10 items
CANNULA ANT/CHMB 27G (MISCELLANEOUS) ×1 IMPLANT
CYSTOTOME ANGL RVRS SHRT 25G (CUTTER) ×1 IMPLANT
FEE CATARACT SUITE SIGHTPATH (MISCELLANEOUS) ×1 IMPLANT
GLOVE BIOGEL PI IND STRL 8 (GLOVE) ×1 IMPLANT
GLOVE SURG LX STRL 8.0 MICRO (GLOVE) ×1 IMPLANT
GLOVE SURG SYN 6.5 PF PI BL (GLOVE) ×1 IMPLANT
LENS IOL TECNIS EYHANCE 23.0 (Intraocular Lens) IMPLANT
NDL FILTER BLUNT 18X1 1/2 (NEEDLE) ×1 IMPLANT
NEEDLE FILTER BLUNT 18X1 1/2 (NEEDLE) ×1 IMPLANT
SYR 3ML LL SCALE MARK (SYRINGE) ×1 IMPLANT

## 2024-03-03 NOTE — Anesthesia Postprocedure Evaluation (Signed)
 Anesthesia Post Note  Patient: Crystal Miller  Procedure(s) Performed: PHACOEMULSIFICATION, CATARACT, WITH IOL INSERTION  7.68  01:07.3 (Right: Eye)  Anesthesia Type: MAC Anesthetic complications: no   No notable events documented.   Last Vitals:  Vitals:   03/03/24 1108 03/03/24 1112  BP: (!) 134/48 (!) 138/57  Pulse: 65 68  Resp: 14 18  Temp: (!) 36.3 C   SpO2: 96% 95%    Last Pain:  Vitals:   03/03/24 1112  TempSrc:   PainSc: 0-No pain                 Keishia Ground C [place]

## 2024-03-03 NOTE — Transfer of Care (Signed)
 Immediate Anesthesia Transfer of Care Note  Patient: Caidence Kaseman  Procedure(s) Performed: PHACOEMULSIFICATION, CATARACT, WITH IOL INSERTION  7.68  01:07.3 (Right: Eye)  Patient Location: PACU  Anesthesia Type: MAC  Level of Consciousness: awake, alert  and patient cooperative  Airway and Oxygen Therapy: Patient Spontanous Breathing   Post-op Assessment: Post-op Vital signs reviewed, Patient's Cardiovascular Status Stable, Respiratory Function Stable, Patent Airway and No signs of Nausea or vomiting  Post-op Vital Signs: Reviewed and stable  Complications: No notable events documented.

## 2024-03-03 NOTE — Op Note (Signed)
 PREOPERATIVE DIAGNOSIS:  Nuclear sclerotic cataract of the right eye.   POSTOPERATIVE DIAGNOSIS:  Right Eye Cataract   OPERATIVE PROCEDURE:ORPROCALL@   SURGEON:  Elsie Carmine, MD.   ANESTHESIA:  Anesthesiologist: Ola Donny BROCKS, MD CRNA: Veronica Alm BROCKS, CRNA  1.      Managed anesthesia care. 2.      0.56ml of Shugarcaine was instilled in the eye following the paracentesis.   COMPLICATIONS:  None.   TECHNIQUE:   Stop and chop   DESCRIPTION OF PROCEDURE:  The patient was examined and consented in the preoperative holding area where the aforementioned topical anesthesia was applied to the right eye and then brought back to the Operating Room where the right eye was prepped and draped in the usual sterile ophthalmic fashion and a lid speculum was placed. A paracentesis was created with the side port blade and the anterior chamber was filled with viscoelastic. A near clear corneal incision was performed with the steel keratome. A continuous curvilinear capsulorrhexis was performed with a cystotome followed by the capsulorrhexis forceps. Hydrodissection and hydrodelineation were carried out with BSS on a blunt cannula. The lens was removed in a stop and chop  technique and the remaining cortical material was removed with the irrigation-aspiration handpiece. The capsular bag was inflated with viscoelastic and the intraocular lens was placed in the capsular bag without complication. The remaining viscoelastic was removed from the eye with the irrigation-aspiration handpiece. The wounds were hydrated. The anterior chamber was flushed with BSS and the eye was inflated to physiologic pressure. 0.1ml of Vigamox was placed in the anterior chamber. The wounds were found to be water tight. The eye was dressed with Combigan. The patient was given protective glasses to wear throughout the day and a shield with which to sleep tonight. The patient was also given drops with which to begin a drop regimen today  and will follow-up with me in one day. Implant Name Type Inv. Item Serial No. Manufacturer Lot No. LRB No. Used Action  LENS IOL TECNIS EYHANCE 23.0 - [id number] Intraocular Lens LENS IOL TECNIS EYHANCE 23.0 [id number] [employer]  Right 1 Implanted   Procedure(s): PHACOEMULSIFICATION, CATARACT, WITH IOL INSERTION  7.68  01:07.3 (Right)  Electronically signed: Elsie Carmine 03/03/2024 11:06 AM

## 2024-03-03 NOTE — H&P (Signed)
 [facility]   Primary Care Physician:  Abbey Bruckner, MD Ophthalmologist: Dr. Elsie Carmine  Pre-Procedure History & Physical: HPI:  Crystal Miller is a 78 y.o. female here for cataract surgery.   Past Medical History:  Diagnosis Date   Acute pyelonephritis 08/01/2018   Anemia in chronic kidney disease    C. 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 does not drive Lt side weakness   Transaminitis 08/17/2023   Type 2 diabetes mellitus with diabetic chronic kidney disease [facility])     Past Surgical History:  Procedure Laterality Date   KIDNEY STONE SURGERY     LOWER EXTREMITY ANGIOGRAPHY Left 12/31/2017   Procedure: LOWER EXTREMITY ANGIOGRAPHY;  Surgeon: Jama Cordella MATSU, MD;  Location: [facility] INVASIVE CV LAB;  Service: Cardiovascular;  Laterality: Left;   LOWER EXTREMITY ANGIOGRAPHY Right 04/22/2018   Procedure: LOWER EXTREMITY ANGIOGRAPHY;  Surgeon: Jama Cordella MATSU, MD;  Location: [facility] INVASIVE CV LAB;  Service: Cardiovascular;  Laterality: Right;    Prior to Admission medications   Medication Sig Start Date End Date Taking? Authorizing Provider  acetaminophen  (TYLENOL ) 500 MG tablet Take 1,000 mg by mouth every 6 (six) hours as needed (for pain.).   Yes [provider]  amLODipine  (NORVASC ) 10 MG tablet  Take 1 tablet (10 mg total) by mouth daily. 01/31/24  Yes Bair, Bruckner, MD  aspirin  EC 81 MG EC tablet Take 1 tablet (81 mg total) by mouth daily. 10/03/16  Yes Vachhani, Vaibhavkumar, MD  atorvastatin  (LIPITOR) 40 MG tablet TAKE 1 TABLET BY MOUTH ONCE DAILY AT  [facility] 01/31/24  Yes Bair, Kalpana, MD  carvedilol  (COREG ) 6.25 MG tablet Take 1 tablet (6.25 mg total) by mouth 2 (two) times daily with a meal. 01/31/24  Yes Bair, Bruckner, MD  diclofenac Sodium (VOLTAREN) 1 % GEL Apply 2 g topically 4 (four) times daily. Patient taking differently: Apply 2 g topically daily as needed.   Yes [provider]  Dulaglutide  (TRULICITY ) 0.75 MG/0.5ML SOAJ Inject 0.75 mg into the skin once a week. 01/31/24  Yes Bair, Kalpana, MD  omeprazole  (PRILOSEC) 20 MG capsule Take 1 capsule (20 mg total) by mouth daily. 10/03/23  Yes Zehr, Jessica D, PA-C  oxybutynin  (DITROPAN -XL) 5 MG 24 hr tablet  12/11/21  Yes [provider]  sertraline  (ZOLOFT ) 25 MG tablet Take 1 tablet (25 mg total) by mouth daily. 01/31/24  Yes Bair, Kalpana, MD  blood glucose meter kit and supplies KIT Dispense based on patient and insurance preference. Use up to four times daily as directed. (FOR ICD-9 250.00, 250.01). 10/05/16   Yvonna Alm Cough, MD    Allergies as of 02/19/2024 - Review Complete 01/31/2024  Allergen Reaction Noted   Metformin  and related Diarrhea 07/30/2017    Family History  Problem Relation Age of Onset  Alzheimer's disease Mother    Arthritis Mother    Hypertension Mother    CVA Father    CAD Father    Heart disease Father    Stroke Father    Hypertension Father    Arthritis Sister    Stroke Brother    Diabetes Brother    Heart disease Brother    Bone cancer Brother    Lung cancer Brother    Diabetes Son    Breast cancer Neg Hx     Social History   Socioeconomic History   Marital status: Widowed    Spouse name: Not on file   Number of children: 3   Years of education: Not on file    Highest education level: Not on file  Occupational History   Occupation: Higher education careers adviser    Comment: retired   Occupation: retired  Tobacco Use   Smoking status: Former    Current packs/day: 0.00    Average packs/day: 1 pack/day for 20.0 years (20.0 ttl pk-yrs)    Types: Cigarettes    Start date: 20    Quit date: 2012    Years since quitting: 13.7   Smokeless tobacco: Never  Vaping Use   Vaping status: Never Used  Substance and Sexual Activity   Alcohol use: No   Drug use: No   Sexual activity: Not Currently    Birth control/protection: Post-menopausal  Other Topics Concern   Not on file  Social History Narrative   Not on file   Social Drivers of Health   Financial Resource Strain: Low Risk  (12/06/2023)   Overall Financial Resource Strain (CARDIA)    Difficulty of Paying Living Expenses: Not hard at all  Food Insecurity: No Food Insecurity (12/06/2023)   Hunger Vital Sign    Worried About Running Out of Food in the Last Year: Never true    Ran Out of Food in the Last Year: Never true  Transportation Needs: No Transportation Needs (12/06/2023)   PRAPARE - Administrator, Civil Service (Medical): No    Lack of Transportation (Non-Medical): No  Physical Activity: Sufficiently Active (12/06/2023)   Exercise Vital Sign    Days of Exercise per Week: 5 days    Minutes of Exercise per Session: 30 min  Stress: No Stress Concern Present (12/06/2023)   [employer] of Occupational Health - Occupational Stress Questionnaire    Feeling of Stress: Only a little  Social Connections: Moderately Isolated (12/06/2023)   Social Connection and Isolation Panel    Frequency of Communication with Friends and Family: More than three times a week    Frequency of Social Gatherings with Friends and Family: More than three times a week    Attends Religious Services: More than 4 times per year    Active Member of [employer] or Organizations: No    Attends Banker  Meetings: Never    Marital Status: Widowed  Intimate Partner Violence: Not At Risk (12/06/2023)   Humiliation, Afraid, Rape, and Kick questionnaire    Fear of Current or Ex-Partner: No    Emotionally Abused: No    Physically Abused: No    Sexually Abused: No    Review of Systems: See HPI, otherwise negative ROS  Physical Exam: BP (!) 159/50   Pulse 66   Temp 97.6 F (36.4 C) (Temporal)   Resp 18   Ht 4' 7.98 (1.422 m)   Wt 62.1 kg   SpO2 97%   BMI 30.73 kg/m  General:   Alert, cooperative. Head:  Normocephalic and atraumatic. Respiratory:  Normal work of breathing. Cardiovascular:  NAD  Impression/Plan: Crystal Miller is here for cataract surgery.  Risks, benefits, limitations, and alternatives regarding cataract surgery have been reviewed with the patient.  Questions have been answered.  All parties agreeable.   Elsie Carmine, MD  03/03/2024, 10:38 AM

## 2024-03-04 ENCOUNTER — Encounter: Payer: Self-pay | Admitting: Ophthalmology

## 2024-03-09 NOTE — Anesthesia Preprocedure Evaluation (Signed)
 Anesthesia Evaluation    Airway Mallampati: III  TM Distance: <3 FB     Dental  (+) Upper Dentures   Pulmonary former smoker          Cardiovascular hypertension,   09-22-16 echo Left ventricle: The cavity size was normal. Wall thickness was    increased in a pattern of mild LVH. There was focal basal    hypertrophy of the septum. Systolic function was vigorous. The    estimated ejection fraction was in the range of 65% to 70%. Wall    motion was normal; there were no regional wall motion    abnormalities. Doppler parameters are consistent with abnormal    left ventricular relaxation (grade 1 diastolic dysfunction).    Doppler parameters are consistent with high ventricular filling    pressure.  - Mitral valve: Mildly calcified annulus. There was mild    regurgitation.  - Left atrium: The atrium was mildly dilated.  - Right ventricle: The cavity size was normal. Systolic function    was normal.  - Tricuspid valve: There was mild regurgitation.         Neuro/Psych    GI/Hepatic   Endo/Other  diabetes    Renal/GU      Musculoskeletal   Abdominal   Peds  Hematology   Anesthesia Other Findings Previous cataract surgery 03-03-24 Dr. Ola   Essential hypertension  DM (diabetes mellitus), type 2 with complications (HCC) Mixed hyperlipidemia  Peripheral vascular disease GERD (gastroesophageal reflux disease)  Depression Stroke [facility])  Renal insufficiency Acute pyelonephritis  Right pontine stroke (HCC) Kidney stones  Transaminitis Sepsis (HCC)  Pharyngitis CVA (cerebral vascular accident) (HCC)  C. difficile colitis Type 2 diabetes mellitus with diabetic chronic kidney disease (HCC)  Stage 3b chronic kidney disease (CKD) (HCC) Anemia in chronic kidney disease  Mild mitral regurgitation by prior echocardiogram Mild tricuspid regurgitation by prior echocardiogram  Grade I diastolic dysfunction     Reproductive/Obstetrics                              Anesthesia Physical Anesthesia Plan  ASA: 3  Anesthesia Plan: MAC   Post-op Pain Management:    Induction: Intravenous  PONV Risk Score and Plan:   Airway Management Planned: Natural Airway and Nasal Cannula  Additional Equipment:   Intra-op Plan:   Post-operative Plan:   Informed Consent: I have reviewed the patients History and Physical, chart, labs and discussed the procedure including the risks, benefits and alternatives for the proposed anesthesia with the patient or authorized representative who has indicated his/her understanding and acceptance.     Dental Advisory Given  Plan Discussed with: Anesthesiologist, CRNA and Surgeon  Anesthesia Plan Comments: (Patient consented for risks of anesthesia including but not limited to:  - adverse reactions to medications - damage to eyes, teeth, lips or other oral mucosa - nerve damage due to positioning  - sore throat or hoarseness - Damage to heart, brain, nerves, lungs, other parts of body or loss of life  Patient voiced understanding and assent.)         Anesthesia Quick Evaluation

## 2024-03-16 ENCOUNTER — Ambulatory Visit: Payer: Self-pay

## 2024-03-16 NOTE — Telephone Encounter (Signed)
 FYI Only or Action Required?: FYI only for provider.  Patient was last seen in primary care on 01/31/2024 by Bair, Kalpana, MD.  Called Nurse Triage reporting Nasal Congestion.  Symptoms began a week ago.  Interventions attempted: Nothing.  Symptoms are: gradually worsening.  Triage Disposition: See PCP When Office is Open (Within 3 Days)  Patient/caregiver understands and will follow disposition?: Yes  Copied from CRM [id number]. Topic: Clinical - Red Word Triage >> Mar 16, 2024 11:20 AM Vena HERO wrote: Red Word that prompted transfer to Nurse Triage: cough for about week with some phlegm and chest congestion. Daughter is at work so please call pt at home at [phone] >> Mar 16, 2024  4:07 PM Aisha D wrote: Pt's daughter Saul is calling back to get an update on the call from earlier. Chrissy stated that she was told they would receive a callback from [facility] but never did.  Reason for Disposition  Lots of coughing  Answer Assessment - Initial Assessment Questions Speaking with pts daughter Collie (on [place]). No appt availability within next 3 days, advised UC. Pt agreeable to go.  1. LOCATION: Where does it hurt?      No pain  2. ONSET: When did the sinus pain start?  (e.g., hours, days)      1 week  3. SEVERITY: How bad is the pain?   (Scale 0-10; or none, mild, moderate or severe)     None  4. RECURRENT SYMPTOM: Have you ever had sinus problems before? If Yes, ask: When was the last time? and What happened that time?      Yes, last time had strep.   5. NASAL CONGESTION: Is the nose blocked? If Yes, ask: Can you open it or must you breathe through your mouth?     Congested but able to breathe through nose  6. NASAL DISCHARGE: Do you have discharge from your nose? If so ask, What color?     Unsure of color of pts sputum.   7. FEVER: Do you have a fever? If Yes, ask: What is it, how was it measured, and when did it start?      No  8. OTHER SYMPTOMS:  Do you have any other symptoms? (e.g., sore throat, cough, earache, difficulty breathing)     Fatigued  9. PREGNANCY: Is there any chance you are pregnant? When was your last menstrual period?     No  Protocols used: Sinus Pain or Congestion-A-AH

## 2024-03-17 ENCOUNTER — Encounter: Payer: Self-pay | Admitting: Anesthesiology

## 2024-03-17 ENCOUNTER — Ambulatory Visit: Admission: RE | Admit: 2024-03-17 | Source: Home / Self Care | Admitting: Ophthalmology

## 2024-03-17 SURGERY — PHACOEMULSIFICATION, CATARACT, WITH IOL INSERTION
Anesthesia: Topical | Laterality: Left

## 2024-03-17 NOTE — Telephone Encounter (Signed)
 Agree with UC evaluation if she continues to be symptomatic. I can see her on Thursday at 4:30.   Thank you,  Luke Shade, MD

## 2024-03-17 NOTE — Telephone Encounter (Signed)
 FYI. Noted

## 2024-03-19 ENCOUNTER — Ambulatory Visit (INDEPENDENT_AMBULATORY_CARE_PROVIDER_SITE_OTHER)

## 2024-03-19 VITALS — BP 121/58 | HR 70 | Temp 98.2°F | Ht <= 58 in | Wt 134.8 lb

## 2024-03-19 DIAGNOSIS — J069 Acute upper respiratory infection, unspecified: Secondary | ICD-10-CM | POA: Diagnosis not present

## 2024-03-19 MED ORDER — BENZONATATE 100 MG PO CAPS: 100.0000 mg | ORAL_CAPSULE | Freq: Three times a day (TID) | ORAL | 0 refills | Status: DC | PRN

## 2024-03-19 MED ORDER — PREDNISONE 20 MG PO TABS: 20.0000 mg | ORAL_TABLET | Freq: Every day | ORAL | 0 refills | Status: AC

## 2024-03-19 NOTE — Progress Notes (Signed)
 Acute Office Visit  Subjective:    Patient ID: Crystal Miller, female    DOB: 10-Nov-1945, 78 y.o.   MRN: [id number]  Chief Complaint  Patient presents with   Cough   Patient is in today for following acute concerns:   HPI Discussed the use of AI scribe software for clinical note transcription with the patient, who gave verbal consent to proceed.  History of Present Illness Crystal Miller is a 78 year old female with h/o c. Dif who presents for evaluation of uri like symptoms with cough. She is accompanied by her daughter.  Symptoms started about 2 weeks ago with runny nose,  cough, initially dry but now producing green phlegm. There is no fever, chills, or difficulty breathing, but they experience a sensation of being 'strangled' when attempting to clear the phlegm from their throat.  She has a history of Clostridium difficile infection and has not experienced diarrhea or body pain, but coughing has caused some soreness in her stomach. No leg swelling or changes in urination patterns have been noted.  Her current medications remain unchanged.  She is up to date on her influenza and COVID 19 immunization.  There has been no recent exposure to COVID or flu, and no significant illness aside from the current symptoms.   ROS As per HPI    Objective:    BP (!) 121/58   Pulse 70   Temp 98.2 F (36.8 C)   Ht 4' 7.98 (1.422 m)   Wt 134 lb 12.8 oz (61.1 kg)   SpO2 98%   BMI 30.24 kg/m    Physical Exam Constitutional:      General: Crystal Miller is not in acute distress. HENT:     Head: Normocephalic.     Right Ear: Tympanic membrane normal.     Left Ear: Tympanic membrane normal.     Mouth/Throat:     Mouth: Mucous membranes are moist.  Cardiovascular:     Rate and Rhythm: Normal rate.  Pulmonary:     Effort: Pulmonary effort is normal.     Breath sounds: Normal breath sounds. No wheezing or rales.  Abdominal:     Palpations: Abdomen is soft.     Tenderness: There is no  guarding.  Musculoskeletal:     Cervical back: Neck supple.     Right lower leg: No edema.     Left lower leg: No edema.  Lymphadenopathy:     Cervical: No cervical adenopathy.  Skin:    General: Skin is warm.  Neurological:     Mental Status: Crystal Miller is alert and oriented to person, place, and time.  Psychiatric:        Mood and Affect: Mood normal.     No results found for any visits on 03/19/24.     Assessment & Plan:  Patient is a pleasant 78 year old female presenting with her daughter for evaluation of ongoing cough and congestion,  URI with cough and congestion Assessment & Plan: - Cough with green sputum production, clear rhinorrhea likely due to viral upper respiratory infection. B/L lungs clear to auscultation, Spo2 normal.  - Prescribed prednisone 20 mg daily for 5 days with food. Monitor for side effects. - Prescribed Tessalon Perles 100 mg up to three times daily for 7 days. Discontinue if no improvement after 2 days. - Advised hydration, soothing foods, and rest. - Monitor for new symptoms such as chest pain or leg swelling. - Consider chest X-ray if no improvement. - Avoid antibiotics  unless necessary, history of C. diff limits antibiotic use.  Orders: -     predniSONE; Take 1 tablet (20 mg total) by mouth daily with breakfast for 5 days.  Dispense: 5 tablet; Refill: 0 -     Benzonatate; Take 1 capsule (100 mg total) by mouth 3 (three) times daily as needed for cough.  Dispense: 21 capsule; Refill: 0    Return if symptoms worsen or fail to improve.  Luke Shade, MD

## 2024-03-19 NOTE — Assessment & Plan Note (Signed)
-   Cough with green sputum production, clear rhinorrhea likely due to viral upper respiratory infection. B/L lungs clear to auscultation, Spo2 normal.  - Prescribed prednisone 20 mg daily for 5 days with food. Monitor for side effects. - Prescribed Tessalon Perles 100 mg up to three times daily for 7 days. Discontinue if no improvement after 2 days. - Advised hydration, soothing foods, and rest. - Monitor for new symptoms such as chest pain or leg swelling. - Consider chest X-ray if no improvement. - Avoid antibiotics unless necessary, history of C. diff limits antibiotic use.

## 2024-04-08 ENCOUNTER — Other Ambulatory Visit: Payer: Self-pay

## 2024-04-08 ENCOUNTER — Inpatient Hospital Stay
Admission: EM | Admit: 2024-04-08 | Discharge: 2024-04-11 | DRG: 195 | Disposition: A | Discharge status: Home Health | Source: Ambulatory Visit | Attending: Internal Medicine | Admitting: Internal Medicine

## 2024-04-08 ENCOUNTER — Emergency Department

## 2024-04-08 ENCOUNTER — Ambulatory Visit
Admission: EM | Admit: 2024-04-08 | Discharge: 2024-04-08 | Disposition: A | Discharge status: Home / Self Care | Attending: Emergency Medicine | Admitting: Emergency Medicine

## 2024-04-08 DIAGNOSIS — Z7985 Long-term (current) use of injectable non-insulin antidiabetic drugs: Secondary | ICD-10-CM

## 2024-04-08 DIAGNOSIS — Z8673 Personal history of transient ischemic attack (TIA), and cerebral infarction without residual deficits: Secondary | ICD-10-CM

## 2024-04-08 DIAGNOSIS — J189 Pneumonia, unspecified organism: Principal | ICD-10-CM | POA: Diagnosis present

## 2024-04-08 DIAGNOSIS — Z8249 Family history of ischemic heart disease and other diseases of the circulatory system: Secondary | ICD-10-CM

## 2024-04-08 DIAGNOSIS — R079 Chest pain, unspecified: Secondary | ICD-10-CM

## 2024-04-08 DIAGNOSIS — Z8616 Personal history of COVID-19: Secondary | ICD-10-CM

## 2024-04-08 DIAGNOSIS — D631 Anemia in chronic kidney disease: Secondary | ICD-10-CM | POA: Diagnosis present

## 2024-04-08 DIAGNOSIS — I959 Hypotension, unspecified: Secondary | ICD-10-CM | POA: Diagnosis not present

## 2024-04-08 DIAGNOSIS — Z8261 Family history of arthritis: Secondary | ICD-10-CM

## 2024-04-08 DIAGNOSIS — Z823 Family history of stroke: Secondary | ICD-10-CM

## 2024-04-08 DIAGNOSIS — R059 Cough, unspecified: Secondary | ICD-10-CM | POA: Diagnosis not present

## 2024-04-08 DIAGNOSIS — Z66 Do not resuscitate: Secondary | ICD-10-CM | POA: Diagnosis present

## 2024-04-08 DIAGNOSIS — J168 Pneumonia due to other specified infectious organisms: Secondary | ICD-10-CM | POA: Diagnosis not present

## 2024-04-08 DIAGNOSIS — E1151 Type 2 diabetes mellitus with diabetic peripheral angiopathy without gangrene: Secondary | ICD-10-CM | POA: Diagnosis present

## 2024-04-08 DIAGNOSIS — F32A Depression, unspecified: Secondary | ICD-10-CM | POA: Diagnosis present

## 2024-04-08 DIAGNOSIS — Z820 Family history of epilepsy and other diseases of the nervous system: Secondary | ICD-10-CM

## 2024-04-08 DIAGNOSIS — I129 Hypertensive chronic kidney disease with stage 1 through stage 4 chronic kidney disease, or unspecified chronic kidney disease: Secondary | ICD-10-CM | POA: Diagnosis present

## 2024-04-08 DIAGNOSIS — R8281 Pyuria: Secondary | ICD-10-CM | POA: Diagnosis present

## 2024-04-08 DIAGNOSIS — R531 Weakness: Secondary | ICD-10-CM | POA: Diagnosis not present

## 2024-04-08 DIAGNOSIS — E782 Mixed hyperlipidemia: Secondary | ICD-10-CM | POA: Diagnosis present

## 2024-04-08 DIAGNOSIS — Z87891 Personal history of nicotine dependence: Secondary | ICD-10-CM

## 2024-04-08 DIAGNOSIS — Z7982 Long term (current) use of aspirin: Secondary | ICD-10-CM

## 2024-04-08 DIAGNOSIS — Z801 Family history of malignant neoplasm of trachea, bronchus and lung: Secondary | ICD-10-CM

## 2024-04-08 DIAGNOSIS — N1832 Chronic kidney disease, stage 3b: Secondary | ICD-10-CM | POA: Diagnosis present

## 2024-04-08 DIAGNOSIS — K219 Gastro-esophageal reflux disease without esophagitis: Secondary | ICD-10-CM | POA: Diagnosis present

## 2024-04-08 DIAGNOSIS — Z79899 Other long term (current) drug therapy: Secondary | ICD-10-CM

## 2024-04-08 DIAGNOSIS — R0602 Shortness of breath: Secondary | ICD-10-CM

## 2024-04-08 DIAGNOSIS — Z1152 Encounter for screening for COVID-19: Secondary | ICD-10-CM

## 2024-04-08 DIAGNOSIS — E1122 Type 2 diabetes mellitus with diabetic chronic kidney disease: Secondary | ICD-10-CM | POA: Diagnosis present

## 2024-04-08 DIAGNOSIS — Z833 Family history of diabetes mellitus: Secondary | ICD-10-CM

## 2024-04-08 DIAGNOSIS — R0789 Other chest pain: Secondary | ICD-10-CM | POA: Diagnosis not present

## 2024-04-08 DIAGNOSIS — R918 Other nonspecific abnormal finding of lung field: Secondary | ICD-10-CM | POA: Diagnosis not present

## 2024-04-08 LAB — COMPREHENSIVE METABOLIC PANEL WITH GFR
ALT: 16 U/L (ref 0–44)
AST: 21 U/L (ref 15–41)
Albumin: 3.4 g/dL — ABNORMAL LOW (ref 3.5–5.0)
Alkaline Phosphatase: 105 U/L (ref 38–126)
Anion gap: 13 (ref 5–15)
BUN: 22 mg/dL (ref 8–23)
CO2: 16 mmol/L — ABNORMAL LOW (ref 22–32)
Calcium: 8.8 mg/dL — ABNORMAL LOW (ref 8.9–10.3)
Chloride: 104 mmol/L (ref 98–111)
Creatinine, Ser: 1.56 mg/dL — ABNORMAL HIGH (ref 0.44–1.00)
GFR, Estimated: 33 mL/min — ABNORMAL LOW (ref >60)
Glucose, Bld: 179 mg/dL — ABNORMAL HIGH (ref 70–99)
Potassium: 4.9 mmol/L (ref 3.5–5.1)
Sodium: 133 mmol/L — ABNORMAL LOW (ref 135–145)
Total Bilirubin: 0.5 mg/dL (ref 0.0–1.2)
Total Protein: 7.2 g/dL (ref 6.5–8.1)

## 2024-04-08 LAB — CBC
HCT: 29.8 % — ABNORMAL LOW (ref 36.0–46.0)
Hemoglobin: 10 g/dL — ABNORMAL LOW (ref 12.0–15.0)
MCH: 30.8 pg (ref 26.0–34.0)
MCHC: 33.6 g/dL (ref 30.0–36.0)
MCV: 91.7 fL (ref 80.0–100.0)
Platelets: 359 K/uL (ref 150–400)
RBC: 3.25 MIL/uL — ABNORMAL LOW (ref 3.87–5.11)
RDW: 12.2 % (ref 11.5–15.5)
WBC: 23.8 K/uL — ABNORMAL HIGH (ref 4.0–10.5)
nRBC: 0 % (ref 0.0–0.2)

## 2024-04-08 LAB — GLUCOSE, POCT (MANUAL RESULT ENTRY): POC Glucose: 183 mg/dL — AB (ref 70–99)

## 2024-04-08 LAB — PROCALCITONIN: Procalcitonin: 0.43 ng/mL

## 2024-04-08 LAB — HIV ANTIBODY (ROUTINE TESTING W REFLEX): HIV Screen 4th Generation wRfx: NONREACTIVE

## 2024-04-08 LAB — LACTIC ACID, PLASMA: Lactic Acid, Venous: 1.5 mmol/L (ref 0.5–1.9)

## 2024-04-08 LAB — RESP PANEL BY RT-PCR (RSV, FLU A&B, COVID)  RVPGX2
Influenza A by PCR: NEGATIVE
Influenza B by PCR: NEGATIVE
Resp Syncytial Virus by PCR: NEGATIVE
SARS Coronavirus 2 by RT PCR: NEGATIVE

## 2024-04-08 MED ORDER — SODIUM CHLORIDE 0.9 % IV SOLN: 2.0000 g | INTRAVENOUS | Status: DC

## 2024-04-08 MED ORDER — SODIUM CHLORIDE 0.9 % IV SOLN
1.0000 g | Freq: Once | INTRAVENOUS | Status: AC
  Administered 2024-04-08: 1 g via INTRAVENOUS
  Filled 2024-04-08: qty 10

## 2024-04-08 MED ORDER — ACETAMINOPHEN 325 MG PO TABS
650.0000 mg | ORAL_TABLET | Freq: Four times a day (QID) | ORAL | Status: DC | PRN
Start: 2024-04-08 — End: 2024-04-11

## 2024-04-08 MED ORDER — ENOXAPARIN SODIUM 30 MG/0.3ML IJ SOSY
30.0000 mg | PREFILLED_SYRINGE | INTRAMUSCULAR | Status: DC
  Administered 2024-04-08 – 2024-04-10 (×3): 30 mg via SUBCUTANEOUS
  Filled 2024-04-08 (×3): qty 0.3

## 2024-04-08 MED ORDER — SODIUM CHLORIDE 0.9 % IV SOLN
500.0000 mg | Freq: Once | INTRAVENOUS | Status: AC
  Administered 2024-04-08: 500 mg via INTRAVENOUS
  Filled 2024-04-08: qty 5

## 2024-04-08 MED ORDER — SODIUM CHLORIDE 0.9 % IV BOLUS
500.0000 mL | Freq: Once | INTRAVENOUS | Status: AC
  Administered 2024-04-08: 500 mL via INTRAVENOUS

## 2024-04-08 MED ORDER — ACETAMINOPHEN 650 MG RE SUPP
650.0000 mg | Freq: Four times a day (QID) | RECTAL | Status: DC | PRN
Start: 2024-04-08 — End: 2024-04-11

## 2024-04-08 MED ORDER — AZITHROMYCIN 500 MG PO TABS
500.0000 mg | ORAL_TABLET | Freq: Every day | ORAL | Status: DC
  Administered 2024-04-09 – 2024-04-11 (×3): 500 mg via ORAL
  Filled 2024-04-08 (×3): qty 1

## 2024-04-08 MED ORDER — LACTATED RINGERS IV SOLN: INTRAVENOUS | Status: AC

## 2024-04-08 MED ORDER — ACETAMINOPHEN 325 MG PO TABS
650.0000 mg | ORAL_TABLET | Freq: Once | ORAL | Status: AC
  Administered 2024-04-08: 650 mg via ORAL
  Filled 2024-04-08: qty 2

## 2024-04-08 MED ORDER — SODIUM CHLORIDE 0.9 % IV SOLN
2.0000 g | INTRAVENOUS | Status: DC
  Administered 2024-04-09 – 2024-04-10 (×2): 2 g via INTRAVENOUS
  Filled 2024-04-08 (×2): qty 20

## 2024-04-08 NOTE — ED Notes (Signed)
 Patient is being discharged from the Urgent Care and sent to the Emergency Department via EMS . Per Corlis Burnard DEL, NP, patient is in need of higher level of care due to weakness, chest soreness, and prior history. Patient is aware and verbalizes understanding of plan of care.  Vitals:   04/08/24 1233 04/08/24 1235  BP: 109/65   Pulse:    Resp:    Temp:    SpO2:  96%

## 2024-04-08 NOTE — ED Triage Notes (Signed)
 Patient presented with daughter with a one day history of weakness, chest pain, fever, cough, and shortness of breath. Patient's O2 saturation dropped as low as 90% during the visit.

## 2024-04-08 NOTE — Progress Notes (Signed)
 Have confirmed with bedside RN that blood cultures were drawn before any antibiotic given. The times in EMR are just barely off and reflective of scanning antibiotic in preparation of administration as soon as cultures drawn.

## 2024-04-08 NOTE — ED Triage Notes (Signed)
 Pt arrives via EMS from [facility] UC. Pt sts that she has been coughing all night long and having chest pain only while coughing. Pt is normally on RA however EMS placed pt on 2l/min via [place].

## 2024-04-08 NOTE — ED Notes (Signed)
 Provider was in room upon arrival and EMS was called therefore the full triage was not completed.

## 2024-04-08 NOTE — Consult Note (Signed)
 CODE SEPSIS - PHARMACY COMMUNICATION  **Broad Spectrum Antibiotics should be administered within 1 hour of Sepsis diagnosis**  Time Code Sepsis Called/Page Received: 1707  Antibiotics Ordered: azithromycin , ceftriaxone   Time of 1st antibiotic administration: 1730  Additional action taken by pharmacy: n/a  If necessary, Name of Provider/Nurse Contacted: n/a    Mayleen Borrero A Kaeden Mester ,PharmD Clinical Pharmacist  04/08/2024  5:11 PM

## 2024-04-08 NOTE — H&P (Addendum)
 History and Physical    Patient: Crystal Miller FMW:[id number] DOB: 17-Oct-1945 DOA: 04/08/2024 DOS: the patient was seen and examined on 04/08/2024 PCP: Abbey Bruckner, MD  Patient coming from: Home  Chief Complaint:  Chief Complaint  Patient presents with   Cough   HPI: Crystal Miller is a 78 y.o. female with medical history significant of DM, HTN, anemia presents with 2 month hx sob. Noted to have temp as high as 101 at home. In ED, CXR notable for minimal R base opacity. WBC elevated at over  23.8k. Pt was started on empiric abx in ED. Hospitalist consulted for consideration for admission  Per family, since discharge in 4/25 for cdiff w/ sepsis, has been persistently weak, which only worsened over time.   Review of Systems: As mentioned in the history of present illness. All other systems reviewed and are negative. Past Medical History:  Diagnosis Date   Acute pyelonephritis 08/01/2018   Anemia in chronic kidney disease    C. 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 does not drive Lt side weakness   Transaminitis 08/17/2023   Type 2 diabetes mellitus with diabetic chronic kidney disease [facility])    Past Surgical History:  Procedure Laterality Date   CATARACT EXTRACTION W/PHACO Right 03/03/2024   Procedure:  PHACOEMULSIFICATION, CATARACT, WITH IOL INSERTION  7.68  01:07.3;  Surgeon: Jaye Fallow, MD;  Location: [facility];  Service: Ophthalmology;  Laterality: Right;   KIDNEY STONE SURGERY     LOWER EXTREMITY ANGIOGRAPHY Left 12/31/2017   Procedure: LOWER EXTREMITY ANGIOGRAPHY;  Surgeon: Jama Cordella MATSU, MD;  Location: [facility] INVASIVE CV LAB;  Service: Cardiovascular;  Laterality: Left;   LOWER EXTREMITY ANGIOGRAPHY Right 04/22/2018   Procedure: LOWER EXTREMITY ANGIOGRAPHY;  Surgeon: Jama Cordella MATSU, MD;  Location: [facility] INVASIVE CV LAB;  Service: Cardiovascular;  Laterality: Right;   Social History:  reports that Khamia quit smoking about 13 years ago. Mckynzi's smoking use included cigarettes. Taylen started smoking about 33 years ago. Shanel has a 20 pack-year smoking history. Fara has never used smokeless tobacco. Rosalita reports that Merleen does not drink alcohol and does not use drugs.  Allergies  Allergen Reactions   Metformin  And Related Diarrhea    Family History  Problem Relation Age of Onset   Alzheimer's disease Mother    Arthritis Mother    Hypertension Mother    CVA Father    CAD Father    Heart disease Father    Stroke Father    Hypertension Father    Arthritis Sister    Stroke Brother    Diabetes Brother    Heart disease Brother    Bone cancer Brother    Lung cancer Brother    Diabetes Son    Breast cancer Neg Hx     Prior to  Admission medications   Medication Sig Start Date End Date Taking? Authorizing Provider  acetaminophen  (TYLENOL ) 500 MG tablet Take 1,000 mg by mouth every 6 (six) hours as needed (for pain.).    [provider]  amLODipine  (NORVASC ) 10 MG tablet Take 1 tablet (10 mg total) by mouth daily. 01/31/24   Bair, Luke, MD  aspirin  EC 81 MG EC tablet Take 1 tablet (81 mg total) by mouth daily. 10/03/16   Vachhani, Vaibhavkumar, MD  atorvastatin  (LIPITOR) 40 MG tablet TAKE 1 TABLET BY MOUTH ONCE DAILY AT  [facility] 01/31/24   Bair, Kalpana,  MD  benzonatate (TESSALON PERLES) 100 MG capsule Take 1 capsule (100 mg total) by mouth 3 (three) times daily as needed for cough. 03/19/24   Bair, Kalpana, MD  blood glucose meter kit and supplies KIT Dispense based on patient and insurance preference. Use up to four times daily as directed. (FOR ICD-9 250.00, 250.01). 10/05/16   Schaevitz, Alm Cough, MD  carvedilol  (COREG ) 6.25 MG tablet Take 1 tablet (6.25 mg total) by mouth 2 (two) times daily with a meal. 01/31/24   Bair, Luke, MD  diclofenac Sodium (VOLTAREN) 1 % GEL Apply 2 g topically 4 (four) times daily. Patient taking differently: Apply 2 g topically daily as needed.    [provider]  Dulaglutide  (TRULICITY ) 0.75 MG/0.5ML SOAJ Inject 0.75 mg into the skin once a week. 01/31/24   Bair, Kalpana, MD  omeprazole  (PRILOSEC) 20 MG capsule Take 1 capsule (20 mg total) by mouth daily. 10/03/23   Zehr, Jessica D, PA-C  oxybutynin  (DITROPAN -XL) 5 MG 24 hr tablet  12/11/21   [provider]  sertraline  (ZOLOFT ) 25 MG tablet Take 1 tablet (25 mg total) by mouth daily. 01/31/24   Abbey Luke, MD    Physical Exam: Vitals:   04/08/24 1640 04/08/24 1645 04/08/24 1651 04/08/24 1652  BP:  118/64  118/64  Pulse: 70 75  71  Resp:    18  Temp:   (!) 97.4 F (36.3 C)   TempSrc:   Oral   SpO2: 95% 97%  99%  Weight:      Height:       General exam: Awake, laying in bed, in nad Respiratory system: Normal respiratory effort, no wheezing Cardiovascular system: regular rate, s1, s2 Gastrointestinal system: Soft, nondistended, positive BS Central nervous system: CN2-12 grossly intact, strength intact Extremities: Perfused, no clubbing Skin: Normal skin turgor, no notable skin lesions seen Psychiatry: Mood normal // no visual hallucinations   Data Reviewed:  Labs reviewed: Na 133, K 4.9, Cr 1.56, WBC 23.8  Assessment and Plan: Leukocytosis -Currently afebrile, although reported temp 101 PTA -F/u ua -Presumed PNA for now  given sx -recheck cbc in AM -check procal  Possible PNA -noted on cxr -cont empiric azithro and rocehpin -recheck cbc in aM -f/u on COVID/Flu/RSV panel. Of note, pt states her sx are similar to her prior covid symptoms  DM2 -continue on SSI as needed  HTN -Bp stable -Cont coreg  per home regimen  Hx CVA -Stable  Generalized weakness -Consult PT/OT   Advance Care Planning:   Code Status: Prior DNR  Consults:   Family Communication: Pt in room  Severity of Illness: The appropriate patient status for this patient is OBSERVATION. Observation status is judged to be reasonable and necessary in order to provide the required intensity of service to ensure the patient's safety. The patient's presenting symptoms, physical exam findings, and initial radiographic and laboratory data in the context  of their medical condition is felt to place them at decreased risk for further clinical deterioration. Furthermore, it is anticipated that the patient will be medically stable for discharge from the hospital within 2 midnights of admission.   Author: Garnette Pelt, MD 04/08/2024 5:23 PM  For on call review [email or web address].

## 2024-04-08 NOTE — ED Provider Notes (Signed)
 [facility] Provider Note    Event Date/Time   First MD Initiated Contact with Patient 04/08/24 1633     (approximate)   History   Cough   HPI  Crystal Miller is a 78 y.o. female who presents today with concern of generalized weakness.  Apparently having chills and weakness.  Few weeks now, but yesterday much more severe in regards to weakness also with some difficulty breathing and some lightheadedness at times.  Increased cough.  No other sick contacts at home.  She went to urgent care where they checked her vital signs and then recommended straight presentation to the emergency department as she felt like she was having increased work of breathing.  Currently she is on 2 L of nasal cannula and seems that her work of breathing has improved.      Physical Exam   Triage Vital Signs: ED Triage Vitals [04/08/24 1309]  Encounter Vitals Group     BP (!) 121/51     Girls Systolic BP Percentile      Girls Diastolic BP Percentile      Boys Systolic BP Percentile      Boys Diastolic BP Percentile      Pulse Rate 77     Resp 19     Temp 98.6 F (37 C)     Temp Source Oral     SpO2 96 %     Weight 134 lb 12.8 oz (61.1 kg)     Height 4' 10 (1.473 m)     Head Circumference      Peak Flow      Pain Score 0     Pain Loc      Pain Education      Exclude from Growth Chart     Most recent vital signs: Vitals:   04/08/24 1835 04/08/24 1955  BP: (!) 148/58 (!) 118/44  Pulse: 73 71  Resp: 16 16  Temp: 98.4 F (36.9 C) 98.2 F (36.8 C)  SpO2: 94% 92%     General: Awake, no distress.  Answering questions appropriately CV:  Good peripheral perfusion.  No lower extremity swelling or edema Resp:  Normal effort.  Clear to auscultation bilaterally, able to speak in full sentences Abd:  No distention.  Soft nontender Other:     ED Results / Procedures / Treatments   Labs (all labs ordered are listed, but only abnormal results are displayed) Labs  Reviewed  CBC - Abnormal; Notable for the following components:      Result Value   WBC 23.8 (*)    RBC 3.25 (*)    Hemoglobin 10.0 (*)    HCT 29.8 (*)    All other components within normal limits  COMPREHENSIVE METABOLIC PANEL WITH GFR - Abnormal; Notable for the following components:   Sodium 133 (*)    CO2 16 (*)    Glucose, Bld 179 (*)    Creatinine, Ser 1.56 (*)    Calcium  8.8 (*)    Albumin 3.4 (*)    GFR, Estimated 33 (*)    All other components within normal limits  RESP PANEL BY RT-PCR (RSV, FLU A&B, COVID)  RVPGX2  CULTURE, BLOOD (SINGLE)  CULTURE, BLOOD (ROUTINE X 2)  CULTURE, BLOOD (ROUTINE X 2)  LACTIC ACID, PLASMA  PROCALCITONIN  COMPREHENSIVE METABOLIC PANEL WITH GFR  CBC  HIV ANTIBODY (ROUTINE TESTING W REFLEX)  URINALYSIS, COMPLETE (UACMP) WITH MICROSCOPIC     EKG Sinus rhythm with rate of  about 75, axis is about 45, intervals appear to be within normal limits, no obvious ischemia that I appreciate this EKG   RADIOLOGY  Very faint consolidation appreciated in the right lower lobe which may be developing pneumonia  PROCEDURES:  Critical Care performed: No  Procedures   MEDICATIONS ORDERED IN ED: Medications  lactated ringers  infusion ( Intravenous New Bag/Given 04/08/24 2216)  enoxaparin  (LOVENOX ) injection 30 mg (30 mg Subcutaneous Given 04/08/24 2212)  acetaminophen  (TYLENOL ) tablet 650 mg (has no administration in time range)    Or  acetaminophen  (TYLENOL ) suppository 650 mg (has no administration in time range)  azithromycin  (ZITHROMAX ) tablet 500 mg (has no administration in time range)  cefTRIAXone  (ROCEPHIN ) 2 g in sodium chloride  0.9 % 100 mL IVPB (has no administration in time range)  cefTRIAXone  (ROCEPHIN ) 1 g in sodium chloride  0.9 % 100 mL IVPB (0 g Intravenous Stopped 04/08/24 1819)  azithromycin  (ZITHROMAX ) 500 mg in sodium chloride  0.9 % 250 mL IVPB (500 mg Intravenous New Bag/Given 04/08/24 1731)  acetaminophen  (TYLENOL ) tablet  650 mg (650 mg Oral Given 04/08/24 1730)  sodium chloride  0.9 % bolus 500 mL (500 mLs Intravenous New Bag/Given 04/08/24 1731)     IMPRESSION / MDM / ASSESSMENT AND PLAN / ED COURSE  I reviewed the triage vital signs and the nursing notes.                               Patient's presentation is most consistent with acute complicated illness / injury requiring diagnostic workup.  78 year old female who presents today with concern of generalized weakness cough fevers.  Here fortunately afebrile, however she has a significant leukocytosis, chest x-ray demonstrating likely developing pneumonia and history appears consistent with this.  She is currently requiring about 2 L of nasal cannula, her white blood cell count is significantly elevated.  Will start her on antibiotics for seems to be community-acquired pneumonia.  Will reach out to medicine for admission.   Clinical Course as of 04/08/24 2343  Wed Apr 08, 2024  1722 Spoke with Dr. Cindy from medicine service for admission at this time. [SK]    Clinical Course User Index [SK] Fernand Rossie HERO, MD     FINAL CLINICAL IMPRESSION(S) / ED DIAGNOSES   Final diagnoses:  Pneumonia of right lower lobe due to infectious organism     Rx / DC Orders   ED Discharge Orders     None        Note:  This document was prepared using Dragon voice recognition software and may include unintentional dictation errors.   Fernand Rossie HERO, MD 04/08/24 [phone]

## 2024-04-08 NOTE — ED Notes (Signed)
 Pt reports feeling weak and intermittent fevers.  Pt had sepsis approx 4 months ago, E coli per family and pt went to rehab.  Family report she has not improved much since.  Pt on 2 liters [place].  Pt has a cough x 2 months and intermittent sob.  No chest pain.  Family at  bedside.

## 2024-04-08 NOTE — Progress Notes (Signed)
 Elink following for sepsis protocol.

## 2024-04-08 NOTE — ED Provider Notes (Signed)
 Crystal Miller    CSN: [id number] Arrival date & time: 04/08/24  May 10, 1211      History   Chief Complaint No chief complaint on file.   HPI Crystal Miller is a 78 y.o. female.  Accompanied by her daughter who is her power of attorney, patient presents with 1 day history of fever, cough, shortness of breath.  The patient woke her up this morning at 0300 because she was having generalized weakness and chest pain.  She was unable to get up due to weakness.  Patient states her chest is sore from coughing but her daughter states she was having severe chest pain this morning.  She states her mother would not let her call EMS at that time.  Tmax 101; Tylenol  given by daughter.  Daughter states her mother almost passed out yesterday.  Patient's medical history includes diabetes, stroke, hypertension.  The history is provided by the patient, a relative and medical records.    Past Medical History:  Diagnosis Date   Acute pyelonephritis 08/01/2018   Anemia in chronic kidney disease    C. difficile colitis 08/17/2023   CVA (cerebral vascular accident) (HCC) 04-23-2017   Depression    husband died in 05-09-16   DM (diabetes mellitus), 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   Essential hypertension 11/01/2016   GERD (gastroesophageal reflux disease)    Grade I diastolic dysfunction    Kidney stones    Mild mitral regurgitation by prior echocardiogram    Mild tricuspid regurgitation by prior echocardiogram    Mixed hyperlipidemia 11/01/2016   Peripheral vascular disease    Pharyngitis 08/17/2023   Renal insufficiency    Right pontine stroke (HCC) 2017/04/23   Sepsis (HCC) 08/17/2023   Stage 3b chronic kidney disease (CKD) (HCC)    Stroke (HCC) 09-May-2017   slight residual with speech. does not drive Lt side weakness   Transaminitis 08/17/2023   Type 2 diabetes mellitus with diabetic chronic kidney disease [facility])     Patient Active Problem List   Diagnosis Date Noted   URI with  cough and congestion 03/19/2024   Need for COVID-19 vaccine 01/31/2024   Obesity (BMI 30-39.9) 01/31/2024   Medication management 01/31/2024   Stage 3b chronic kidney disease (HCC) 01/31/2024   Cerumen impaction 02/01/2023   Atherosclerosis of native arteries of extremity with intermittent claudication 03/12/2022   Onychomycosis 07/25/2021   Subclinical hypothyroidism 04/23/2020   GERD (gastroesophageal reflux disease) 08/19/2019   Diabetic retinopathy (HCC) 12/04/2018   Normocytic anemia 10/03/2018   Overactive bladder 06/30/2018   Chronic bilateral low back pain without sciatica 02/19/2018   Mood disorder 02/19/2018   Pain of right hip joint 10/30/2017   PAD (peripheral artery disease) 10/10/2017   DM (diabetes mellitus), 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   Essential hypertension 11/01/2016   Mixed hyperlipidemia 11/01/2016   Memory change 11/01/2016   History of stroke 10/01/2016    Past Surgical History:  Procedure Laterality Date   CATARACT EXTRACTION W/PHACO Right 03/03/2024   Procedure: PHACOEMULSIFICATION, CATARACT, WITH IOL INSERTION  7.68  01:07.3;  Surgeon: Jaye Fallow, MD;  Location: [facility];  Service: Ophthalmology;  Laterality: Right;   KIDNEY STONE SURGERY     LOWER EXTREMITY ANGIOGRAPHY Left 12/31/2017   Procedure: LOWER EXTREMITY ANGIOGRAPHY;  Surgeon: Jama Cordella MATSU, MD;  Location: [facility] INVASIVE CV LAB;  Service: Cardiovascular;  Laterality: Left;   LOWER EXTREMITY ANGIOGRAPHY Right 04/22/2018   Procedure: LOWER EXTREMITY ANGIOGRAPHY;  Surgeon: Jama Cordella MATSU, MD;  Location: [facility] INVAS[facility] CV [facility];  Service: Cardiovascular;  Laterality: Right;    OB History   No obstetric history on file.      Home Medications    Prior to Admission medications   Medication Sig Start Date End Date Taking? Authorizing Provider  acetaminophen  (TYLENOL ) 500 MG tablet Take 1,000 mg by mouth every 6 (six) hours as needed (for pain.).     [provider]  amLODipine  (NORVASC ) 10 MG tablet Take 1 tablet (10 mg total) by mouth daily. 01/31/24   Abbey Bruckner, MD  aspirin  EC 81 MG EC tablet Take 1 tablet (81 mg total) by mouth daily. 10/03/16   Vachhani, Vaibhavkumar, MD  atorvastatin  (LIPITOR) 40 MG tablet TAKE 1 TABLET BY MOUTH ONCE DAILY AT  [facility] 01/31/24   Bair, Kalpana, MD  benzonatate (TESSALON PERLES) 100 MG capsule Take 1 capsule (100 mg total) by mouth 3 (three) times daily as needed for cough. 03/19/24   Bair, Kalpana, MD  blood glucose meter kit and supplies KIT Dispense based on patient and insurance preference. Use up to four times daily as directed. (FOR ICD-9 250.00, 250.01). 10/05/16   Schaevitz, Alm Cough, MD  carvedilol  (COREG ) 6.25 MG tablet Take 1 tablet (6.25 mg total) by mouth 2 (two) times daily with a meal. 01/31/24   Bair, Bruckner, MD  diclofenac Sodium (VOLTAREN) 1 % GEL Apply 2 g topically 4 (four) times daily. Patient taking differently: Apply 2 g topically daily as needed.    [provider]  Dulaglutide  (TRULICITY ) 0.75 MG/0.5ML SOAJ Inject 0.75 mg into the skin once a week. 01/31/24   Bair, Bruckner, MD  omeprazole  (PRILOSEC) 20 MG capsule Take 1 capsule (20 mg total) by mouth daily. 10/03/23   Zehr, Jessica D, PA-C  oxybutynin  (DITROPAN -XL) 5 MG 24 hr tablet  12/11/21   [provider]  sertraline  (ZOLOFT ) 25 MG tablet Take 1 tablet (25 mg total) by mouth daily. 01/31/24   Abbey Bruckner, MD    Family History Family History  Problem Relation Age of Onset   Alzheimer's disease Mother    Arthritis Mother    Hypertension Mother    CVA Father    CAD Father    Heart disease Father    Stroke Father    Hypertension Father    Arthritis Sister    Stroke Brother    Diabetes Brother    Heart disease Brother    Bone cancer Brother    Lung cancer Brother    Diabetes Son    Breast cancer Neg Hx     Social History Social History   Tobacco Use   Smoking status: Former    Current  packs/day: 0.00    Average packs/day: 1 pack/day for 20.0 years (20.0 ttl pk-yrs)    Types: Cigarettes    Start date: 65    Quit date: 2012    Years since quitting: 13.8   Smokeless tobacco: Never  Vaping Use   Vaping status: Never Used  Substance Use Topics   Alcohol use: No   Drug use: No     Allergies   Metformin  and related   Review of Systems Review of Systems  Constitutional:  Positive for fever. Negative for chills.  Respiratory:  Positive for cough and shortness of breath.   Cardiovascular:  Positive for chest pain. Negative for palpitations.  Neurological:  Positive for weakness. Negative for syncope.     Physical Exam Triage Vital Signs ED Triage Vitals [04/08/24 1224]  Encounter  Vitals Group     BP 100/63     Girls Systolic BP Percentile      Girls Diastolic BP Percentile      Boys Systolic BP Percentile      Boys Diastolic BP Percentile      Pulse Rate 82     Resp (!) 24     Temp 98.5 F (36.9 C)     Temp Source Oral     SpO2 94 %     Weight      Height      Head Circumference      Peak Flow      Pain Score      Pain Loc      Pain Education      Exclude from Growth Chart    No data found.  Updated Vital Signs BP 109/65 (BP Location: Right Arm)   Pulse 81   Temp (P) 98.5 F (36.9 C)   Resp (!) 24   SpO2 96%   Visual Acuity Right Eye Distance:   Left Eye Distance:   Bilateral Distance:    Right Eye Near:   Left Eye Near:    Bilateral Near:     Physical Exam Constitutional:      General: Crystal Miller is not in acute distress. HENT:     Mouth/Throat:     Mouth: Mucous membranes are moist.  Cardiovascular:     Rate and Rhythm: Normal rate and regular rhythm.     Heart sounds: Normal heart sounds.  Pulmonary:     Effort: Pulmonary effort is normal. Tachypnea present. No respiratory distress.     Breath sounds: Normal breath sounds.  Neurological:     Mental Status: Crystal Miller is alert.      UC Treatments / Results  Labs (all labs  ordered are listed, but only abnormal results are displayed) Labs Reviewed  GLUCOSE, POCT (MANUAL RESULT ENTRY) - Abnormal; Notable for the following components:      Result Value   POC Glucose 183 (*)    All other components within normal limits    EKG   Radiology No results found.  Procedures Procedures (including critical care time)  Medications Ordered in UC Medications - No data to display  Initial Impression / Assessment and Plan / UC Course  I have reviewed the triage vital signs and the nursing notes.  Pertinent labs & imaging results that were available during my care of the patient were reviewed by me and considered in my medical decision making (see chart for details).    Chest pain, shortness of breath, generalized weakness.  Patient needed wheelchair for transport to the exam room due to generalized weakness.  Her O2 sat on room air was as low as 90%.  Improved to 97% on 2 L of O2.  Sending patient to the ED for evaluation via EMS.  Patient's daughter is agreeable to this (power of attorney).  Final Clinical Impressions(s) / UC Diagnoses   Final diagnoses:  Chest pain, unspecified type  Shortness of breath  Weakness generalized   Discharge Instructions   None    ED Prescriptions   None    PDMP not reviewed this encounter.   Corlis Burnard DEL, NP 04/08/24 [phone]

## 2024-04-09 DIAGNOSIS — Z79899 Other long term (current) drug therapy: Secondary | ICD-10-CM | POA: Diagnosis not present

## 2024-04-09 DIAGNOSIS — Z1152 Encounter for screening for COVID-19: Secondary | ICD-10-CM | POA: Diagnosis not present

## 2024-04-09 DIAGNOSIS — Z87891 Personal history of nicotine dependence: Secondary | ICD-10-CM | POA: Diagnosis not present

## 2024-04-09 DIAGNOSIS — J168 Pneumonia due to other specified infectious organisms: Secondary | ICD-10-CM | POA: Diagnosis not present

## 2024-04-09 DIAGNOSIS — R8281 Pyuria: Secondary | ICD-10-CM | POA: Diagnosis not present

## 2024-04-09 DIAGNOSIS — K219 Gastro-esophageal reflux disease without esophagitis: Secondary | ICD-10-CM | POA: Diagnosis not present

## 2024-04-09 DIAGNOSIS — Z801 Family history of malignant neoplasm of trachea, bronchus and lung: Secondary | ICD-10-CM | POA: Diagnosis not present

## 2024-04-09 DIAGNOSIS — R918 Other nonspecific abnormal finding of lung field: Secondary | ICD-10-CM | POA: Diagnosis not present

## 2024-04-09 DIAGNOSIS — Z8616 Personal history of COVID-19: Secondary | ICD-10-CM | POA: Diagnosis not present

## 2024-04-09 DIAGNOSIS — E782 Mixed hyperlipidemia: Secondary | ICD-10-CM | POA: Diagnosis not present

## 2024-04-09 DIAGNOSIS — I129 Hypertensive chronic kidney disease with stage 1 through stage 4 chronic kidney disease, or unspecified chronic kidney disease: Secondary | ICD-10-CM | POA: Diagnosis not present

## 2024-04-09 DIAGNOSIS — Z8249 Family history of ischemic heart disease and other diseases of the circulatory system: Secondary | ICD-10-CM | POA: Diagnosis not present

## 2024-04-09 DIAGNOSIS — R059 Cough, unspecified: Secondary | ICD-10-CM | POA: Diagnosis not present

## 2024-04-09 DIAGNOSIS — Z8261 Family history of arthritis: Secondary | ICD-10-CM | POA: Diagnosis not present

## 2024-04-09 DIAGNOSIS — Z7982 Long term (current) use of aspirin: Secondary | ICD-10-CM | POA: Diagnosis not present

## 2024-04-09 DIAGNOSIS — Z8673 Personal history of transient ischemic attack (TIA), and cerebral infarction without residual deficits: Secondary | ICD-10-CM | POA: Diagnosis not present

## 2024-04-09 DIAGNOSIS — Z7985 Long-term (current) use of injectable non-insulin antidiabetic drugs: Secondary | ICD-10-CM | POA: Diagnosis not present

## 2024-04-09 DIAGNOSIS — J189 Pneumonia, unspecified organism: Secondary | ICD-10-CM | POA: Diagnosis not present

## 2024-04-09 DIAGNOSIS — E1151 Type 2 diabetes mellitus with diabetic peripheral angiopathy without gangrene: Secondary | ICD-10-CM | POA: Diagnosis not present

## 2024-04-09 DIAGNOSIS — N1832 Chronic kidney disease, stage 3b: Secondary | ICD-10-CM | POA: Diagnosis not present

## 2024-04-09 DIAGNOSIS — Z820 Family history of epilepsy and other diseases of the nervous system: Secondary | ICD-10-CM | POA: Diagnosis not present

## 2024-04-09 DIAGNOSIS — Z66 Do not resuscitate: Secondary | ICD-10-CM | POA: Diagnosis not present

## 2024-04-09 DIAGNOSIS — Z823 Family history of stroke: Secondary | ICD-10-CM | POA: Diagnosis not present

## 2024-04-09 DIAGNOSIS — Z833 Family history of diabetes mellitus: Secondary | ICD-10-CM | POA: Diagnosis not present

## 2024-04-09 DIAGNOSIS — E1122 Type 2 diabetes mellitus with diabetic chronic kidney disease: Secondary | ICD-10-CM | POA: Diagnosis not present

## 2024-04-09 DIAGNOSIS — R0789 Other chest pain: Secondary | ICD-10-CM | POA: Diagnosis not present

## 2024-04-09 DIAGNOSIS — D631 Anemia in chronic kidney disease: Secondary | ICD-10-CM | POA: Diagnosis not present

## 2024-04-09 DIAGNOSIS — F32A Depression, unspecified: Secondary | ICD-10-CM | POA: Diagnosis not present

## 2024-04-09 LAB — CBC
HCT: 26.4 % — ABNORMAL LOW (ref 36.0–46.0)
Hemoglobin: 9.1 g/dL — ABNORMAL LOW (ref 12.0–15.0)
MCH: 31.4 pg (ref 26.0–34.0)
MCHC: 34.5 g/dL (ref 30.0–36.0)
MCV: 91 fL (ref 80.0–100.0)
Platelets: 314 K/uL (ref 150–400)
RBC: 2.9 MIL/uL — ABNORMAL LOW (ref 3.87–5.11)
RDW: 12.2 % (ref 11.5–15.5)
WBC: 14.7 K/uL — ABNORMAL HIGH (ref 4.0–10.5)
nRBC: 0 % (ref 0.0–0.2)

## 2024-04-09 LAB — COMPREHENSIVE METABOLIC PANEL WITH GFR
ALT: 12 U/L (ref 0–44)
AST: 16 U/L (ref 15–41)
Albumin: 3 g/dL — ABNORMAL LOW (ref 3.5–5.0)
Alkaline Phosphatase: 101 U/L (ref 38–126)
Anion gap: 9 (ref 5–15)
BUN: 20 mg/dL (ref 8–23)
CO2: 20 mmol/L — ABNORMAL LOW (ref 22–32)
Calcium: 8.6 mg/dL — ABNORMAL LOW (ref 8.9–10.3)
Chloride: 106 mmol/L (ref 98–111)
Creatinine, Ser: 1.36 mg/dL — ABNORMAL HIGH (ref 0.44–1.00)
GFR, Estimated: 39 mL/min — ABNORMAL LOW (ref >60)
Glucose, Bld: 103 mg/dL — ABNORMAL HIGH (ref 70–99)
Potassium: 4.7 mmol/L (ref 3.5–5.1)
Sodium: 135 mmol/L (ref 135–145)
Total Bilirubin: 0.3 mg/dL (ref 0.0–1.2)
Total Protein: 6.4 g/dL — ABNORMAL LOW (ref 6.5–8.1)

## 2024-04-09 LAB — URINALYSIS, COMPLETE (UACMP) WITH MICROSCOPIC
Bilirubin Urine: NEGATIVE
Glucose, UA: NEGATIVE mg/dL
Hgb urine dipstick: NEGATIVE
Ketones, ur: NEGATIVE mg/dL
Nitrite: NEGATIVE
Protein, ur: 30 mg/dL — AB
Specific Gravity, Urine: 1.009 (ref 1.005–1.030)
WBC, UA: >50 WBC/hpf (ref 0–5)
pH: 6 (ref 5.0–8.0)

## 2024-04-09 LAB — MRSA NEXT GEN BY PCR, NASAL: MRSA by PCR Next Gen: NOT DETECTED

## 2024-04-09 NOTE — Evaluation (Signed)
 Occupational Therapy Evaluation Patient Details Name: Crystal Miller MRN: [id number] DOB: 1946/04/27 Today's Date: 04/09/2024   History of Present Illness   Crystal Miller is a 79 y.o. female with medical history significant of DM, HTN, anemia presents with 2 month hx sob. Noted to have temp as high as 101 at home. In ED, CXR notable for minimal R base opacity. WBC elevated at over  23.8k. Pt was started on empiric abx in ED. Hospitalist consulted for consideration for admission     Per family, since discharge in 4/25 for cdiff w/ sepsis, has been persistently weak, which only worsened over time.  Positive for PNA on chest xray     Clinical Impressions Patient was seen for OT evaluation this date. Prior to hospital admission, patient was having increased difficulty managing more than her basic self care; prior to the last few weeks she was able to walk for exercise in parking lot of apartment complex, manage meals/cooking/laundry. The last few weeks she reports she has limited her activity to the bare minimum. Patient lives in a first floor apartment with her daughter (who works full time outside of the home) and her 62 year old great grandson.   Patient eating breakfast in bed upon OT/PT arrival, on 1L of O2; O2 removed and patient 91%, instructed on pursed lip breathing with good return demo; she did drop to 85% when blowing nose and did not go above 90% after 10 feet of ambulation. She reports 9/10 fatigue after 10 feet of mobility/toileting. She requested to return to bed.Patient performed transfers and in room mobility with min A/CGA. Patient presents with deficits in overall activity tolerance, standing tolerance/balance, affecting safe and optimal ADL completion. Patient is currently requiring min A for ADLs and transfers.  Paient would benefit from skilled OT services to address noted impairments and functional limitations (see below for any additional details) in order to maximize safety and  independence while minimizing future risk of falls, injury, and readmission. Anticipate the need for follow up OT services upon acute hospital DC.      If plan is discharge home, recommend the following:   A little help with walking and/or transfers;A little help with bathing/dressing/bathroom     Functional Status Assessment   Patient has had a recent decline in their functional status and demonstrates the ability to make significant improvements in function in a reasonable and predictable amount of time.     Equipment Recommendations   None recommended by OT     Recommendations for Other Services         Precautions/Restrictions   Precautions Precautions: Fall Recall of Precautions/Restrictions: Intact Restrictions Weight Bearing Restrictions Per Provider Order: No     Mobility Bed Mobility Overal bed mobility: Needs Assistance Bed Mobility: Supine to Sit     Supine to sit: HOB elevated, Used rails, Min assist, Contact guard          Transfers Overall transfer level: Needs assistance Equipment used: Rolling walker (2 wheels) Transfers: Bed to chair/wheelchair/BSC, Sit to/from Stand Sit to Stand: From elevated surface, Min assist     Step pivot transfers: Min assist     General transfer comment: cues for hand placement, min A to come into standing      Balance Overall balance assessment: Needs assistance Sitting-balance support: Feet supported, No upper extremity supported Sitting balance-Leahy Scale: Good     Standing balance support: During functional activity Standing balance-Leahy Scale: Fair  ADL either performed or assessed with clinical judgement   ADL Overall ADL's : Needs assistance/impaired Eating/Feeding: Set up   Grooming: Wash/dry hands;Set up;Bed level   Upper Body Bathing: Set up   Lower Body Bathing: Minimal assistance   Upper Body Dressing : Set up   Lower Body Dressing:  Minimal assistance   Toilet Transfer: Minimal assistance   Toileting- Clothing Manipulation and Hygiene: Minimal assistance               Vision         Perception         Praxis         Pertinent Vitals/Pain Pain Assessment Pain Assessment: No/denies pain     Extremity/Trunk Assessment Upper Extremity Assessment Upper Extremity Assessment: Generalized weakness   Lower Extremity Assessment Lower Extremity Assessment: Defer to PT evaluation       Communication Communication Communication: No apparent difficulties   Cognition Arousal: Alert Behavior During Therapy: WFL for tasks assessed/performed                                 Following commands: Intact       Cueing  General Comments   Cueing Techniques: Verbal cues  on 1L O2 at start, 91%; removed O2 dropped to 85 with ambulating 10 feet; replaced O2 to 1L   Exercises     Shoulder Instructions      Home Living Family/patient expects to be discharged to:: Private residence Living Arrangements: Children Available Help at Discharge: Family;Available PRN/intermittently Type of Home: Apartment Home Access: Level entry     Home Layout: One level     Bathroom Shower/Tub: Chief Strategy Officer: Handicapped height Bathroom Accessibility: Yes   Home Equipment: Educational Psychologist (4 wheels);Toilet riser;BSC/3in1          Prior Functioning/Environment Prior Level of Function : Independent/Modified Independent;History of Falls (last six months)             Mobility Comments: uses rollator for community mobility, occasionally uses in the apartment ADLs Comments: manages basic ADLs, daughter helps with IADLs    OT Problem List: Decreased strength;Decreased activity tolerance;Impaired balance (sitting and/or standing)   OT Treatment/Interventions: Self-care/ADL training;Therapeutic exercise;Energy conservation;DME and/or AE instruction;Therapeutic  activities;Balance training      OT Goals(Current goals can be found in the care plan section)   Acute Rehab OT Goals Patient Stated Goal: to go home OT Goal Formulation: With patient/family Time For Goal Achievement: 04/23/24 Potential to Achieve Goals: Good ADL Goals Pt Will Perform Grooming: with modified independence;sitting;standing Pt Will Perform Lower Body Dressing: with modified independence;sit to/from stand Pt Will Transfer to Toilet: with modified independence;ambulating;regular height toilet;bedside commode Pt Will Perform Toileting - Clothing Manipulation and hygiene: with modified independence;sit to/from stand   OT Frequency:  Min 2X/week    Co-evaluation PT/OT/SLP Co-Evaluation/Treatment: Yes Reason for Co-Treatment: To address functional/ADL transfers;For patient/therapist safety PT goals addressed during session: Mobility/safety with mobility;Proper use of DME OT goals addressed during session: ADL's and self-care      AM-PAC OT 6 Clicks Daily Activity     Outcome Measure Help from another person eating meals?: None Help from another person taking care of personal grooming?: None Help from another person toileting, which includes using toliet, bedpan, or urinal?: A Little Help from another person bathing (including washing, rinsing, drying)?: A Little Help from another person to put on and taking off regular  upper body clothing?: A Little Help from another person to put on and taking off regular lower body clothing?: A Little 6 Click Score: 20   End of Session Equipment Utilized During Treatment: Gait belt;Rolling walker (2 wheels);Oxygen  Activity Tolerance: Patient tolerated treatment well Patient left: in bed;with call bell/phone within reach;with bed alarm set  OT Visit Diagnosis: Unsteadiness on feet (R26.81);History of falling (Z91.81);Muscle weakness (generalized) (M62.81)                Time: 9079-9053 OT Time Calculation (min): 26  min Charges:  OT General Charges $OT Visit: 1 Visit OT Evaluation $OT Eval Low Complexity: 1 Low OT Treatments $Self Care/Home Management : 23-37 mins  Rogers Clause, OT/L MSOT, 04/09/2024

## 2024-04-09 NOTE — Progress Notes (Signed)
 Progress Note    Oakleigh Hesketh  FMW:[id number] DOB: 1946/01/26  DOA: 04/08/2024 PCP: Abbey Bruckner, MD      Brief Narrative:    Medical records reviewed and are as summarized below:  Crystal Miller is a 78 y.o. female with medical history significant for diabetes mellitus, hypertension, chronic anemia, who presented to the hospital with cough, shortness of breath and general weakness.  She says she has been coughing for about a month.  She was given some cough medicine and prednisone about a month ago.  She said she has completed these medicines but she was still having symptoms.  She felt weaker and weaker.  Cough was associated with pleuritic chest pain.  Reportedly, she had a fever with temperature of 101 F at home.  She went to the urgent care clinic and she was referred to the ED for further management.  Chest x-ray  IMPRESSION: Minimal right base opacification likely atelectasis and less likely infection.      Assessment/Plan:   Principal Problem:   Pneumonia   Body mass index is 28.17 kg/m.   Community-acquired pneumonia, leukocytosis: Continue IV ceftriaxone  and azithromycin . Follow-up blood cultures. She is on 1 L/min oxygen via [place] but no documented hypoxia.  Wean off oxygen as able   Abnormal urinalysis, pyuria: Patient is asymptomatic.  She is already on antibiotics.   Type II DM: NovoLog  as needed for hyperglycemia per sliding scale. Trulicity  on hold   Comorbidities include hypertension, CKD stage IIIb, history of stroke on aspirin     Diet Order             Diet regular Room service appropriate? Yes; Fluid consistency: Thin  Diet effective now                                  Consultants: None  Procedures: None    Medications:    azithromycin   500 mg Oral Daily   enoxaparin  (LOVENOX ) injection  30 mg Subcutaneous Q24H   Continuous Infusions:  cefTRIAXone  (ROCEPHIN )  IV     lactated ringers 150 mL/hr at  04/08/24 2216     Anti-infectives (From admission, onward)    Start     Dose/Rate Route Frequency Ordered Stop   04/09/24 1700  cefTRIAXone  (ROCEPHIN ) 2 g in sodium chloride  0.9 % 100 mL IVPB        2 g 200 mL/hr over 30 Minutes Intravenous Every 24 hours 04/08/24 1749 04/13/24 1659   04/09/24 1000  azithromycin  (ZITHROMAX ) tablet 500 mg        500 mg Oral Daily 04/08/24 1741 04/13/24 0959   04/09/24 0000  cefTRIAXone  (ROCEPHIN ) 2 g in sodium chloride  0.9 % 100 mL IVPB  Status:  Discontinued        2 g 200 mL/hr over 30 Minutes Intravenous Every 24 hours 04/08/24 1741 04/08/24 1749   04/08/24 1715  cefTRIAXone  (ROCEPHIN ) 1 g in sodium chloride  0.9 % 100 mL IVPB        1 g 200 mL/hr over 30 Minutes Intravenous  Once 04/08/24 1703 04/08/24 1819   04/08/24 1715  azithromycin  (ZITHROMAX ) 500 mg in sodium chloride  0.9 % 250 mL IVPB        500 mg 250 mL/hr over 60 Minutes Intravenous  Once 04/08/24 1703 04/08/24 1831              Family Communication/Anticipated D/C date and plan/Code Status  DVT prophylaxis: enoxaparin  (LOVENOX ) injection 30 mg Start: 04/08/24 2200     Code Status: Limited: Do not attempt resuscitation (DNR) -DNR-LIMITED -Do Not Intubate/DNI   Family Communication: None Disposition Plan: Plan to discharge home   Status is: Inpatient Remains inpatient appropriate because: Pneumonia       Subjective:   Interval events noted.  She complains of cough, congestion and pleuritic chest pain.  Breathing is better.  No urinary symptoms  Objective:    Vitals:   04/08/24 1820 04/08/24 1835 04/08/24 1955 04/09/24 0511  BP:  (!) 148/58 (!) 118/44 (!) 119/46  Pulse: 79 73 71 81  Resp: 17 16 16    Temp:  98.4 F (36.9 C) 98.2 F (36.8 C) 98.5 F (36.9 C)  TempSrc:  Oral Oral Oral  SpO2: 95% 94% 92% 95%  Weight:      Height:       No data found.   Intake/Output Summary (Last 24 hours) at 04/09/2024 1130 Last data filed at 04/09/2024 0400 Gross  per 24 hour  Intake 427.65 ml  Output --  Net 427.65 ml   Filed Weights   04/08/24 1309  Weight: 61.1 kg    Exam:  GEN: NAD SKIN: Warm and dry EYES: No pallor or icterus ENT: MMM CV: RRR PULM: Mild bibasilar rales.  No wheezing ABD: soft, ND, NT, +BS CNS: AAO x 3, non focal EXT: No edema or tenderness        Data Reviewed:   I have personally reviewed following labs and imaging studies:  Labs: Labs show the following:   Basic Metabolic Panel: Recent Labs  Lab 04/08/24 1311 04/09/24 0522  NA 133* 135  K 4.9 4.7  CL 104 106  CO2 16* 20*  GLUCOSE 179* 103*  BUN 22 20  CREATININE 1.56* 1.36*  CALCIUM  8.8* 8.6*   GFR Estimated Creatinine Clearance: 25.9 mL/min (A) (by C-G formula based on SCr of 1.36 mg/dL (H)). Liver Function Tests: Recent Labs  Lab 04/08/24 1311 04/09/24 0522  AST 21 16  ALT 16 12  ALKPHOS 105 101  BILITOT 0.5 0.3  PROT 7.2 6.4*  ALBUMIN 3.4* 3.0*   No results for input(s): LIPASE, AMYLASE in the last 168 hours. No results for input(s): AMMONIA in the last 168 hours. Coagulation profile No results for input(s): INR, PROTIME in the last 168 hours.  CBC: Recent Labs  Lab 04/08/24 1311 04/09/24 0522  WBC 23.8* 14.7*  HGB 10.0* 9.1*  HCT 29.8* 26.4*  MCV 91.7 91.0  PLT 359 314   Cardiac Enzymes: No results for input(s): CKTOTAL, CKMB, CKMBINDEX, TROPONINI in the last 168 hours. BNP (last 3 results) No results for input(s): PROBNP in the last 8760 hours. CBG: No results for input(s): GLUCAP in the last 168 hours. D-Dimer: No results for input(s): DDIMER in the last 72 hours. Hgb A1c: No results for input(s): HGBA1C in the last 72 hours. Lipid Profile: No results for input(s): CHOL, HDL, LDLCALC, TRIG, CHOLHDL, LDLDIRECT in the last 72 hours. Thyroid  function studies: No results for input(s): TSH, T4TOTAL, T3FREE, THYROIDAB in the last 72 hours.  Invalid input(s):  FREET3 Anemia work up: No results for input(s): VITAMINB12, FOLATE, FERRITIN, TIBC, IRON, RETICCTPCT in the last 72 hours. Sepsis Labs: Recent Labs  Lab 04/08/24 1311 04/08/24 1730 04/08/24 1926 04/09/24 0522  PROCALCITON  --   --  0.43  --   WBC 23.8*  --   --  14.7*  LATICACIDVEN  --  1.5  --   --  Microbiology Recent Results (from the past 240 hours)  Blood culture (routine x 2)     Status: None (Preliminary result)   Collection Time: 04/08/24  5:31 PM   Specimen: BLOOD  Result Value Ref Range Status   Specimen Description BLOOD BLOOD RIGHT WRIST  Final   Special Requests   Final    BOTTLES DRAWN AEROBIC AND ANAEROBIC Blood Culture results may not be optimal due to an inadequate volume of blood received in culture bottles   Culture   Final    NO GROWTH < 12 HOURS Performed at [facility], [address], [place], [place] [address]    Report Status PENDING  Incomplete  Blood culture (routine x 2)     Status: None (Preliminary result)   Collection Time: 04/08/24  5:36 PM   Specimen: BLOOD  Result Value Ref Range Status   Specimen Description BLOOD LEFT ANTECUBITAL  Final   Special Requests   Final    BOTTLES DRAWN AEROBIC AND ANAEROBIC Blood Culture adequate volume   Culture   Final    NO GROWTH < 12 HOURS Performed at [facility], [address], [place], [place] [address]    Report Status PENDING  Incomplete  Resp panel by RT-PCR (RSV, Flu A&B, Covid) Anterior Nasal Swab     Status: None   Collection Time: 04/08/24  5:40 PM   Specimen: Anterior Nasal Swab  Result Value Ref Range Status   SARS Coronavirus 2 by RT PCR NEGATIVE NEGATIVE Final    Comment: (NOTE) SARS-CoV-2 target nucleic acids are NOT DETECTED.  The SARS-CoV-2 RNA is generally detectable in upper respiratory specimens during the acute phase of infection. The lowest concentration of SARS-CoV-2 viral copies this assay can detect is 138 copies/mL. A negative  result does not preclude SARS-Cov-2 infection and should not be used as the sole basis for treatment or other patient management decisions. A negative result may occur with  improper specimen collection/handling, submission of specimen other than nasopharyngeal swab, presence of viral mutation(s) within the areas targeted by this assay, and inadequate number of viral copies(<138 copies/mL). A negative result must be combined with clinical observations, patient history, and epidemiological information. The expected result is Negative.  Fact Sheet for Patients:  bloggercourse.com  Fact Sheet for Healthcare Providers:  seriousbroker.it  This test is no t yet approved or cleared by the [place]  FDA and  has been authorized for detection and/or diagnosis of SARS-CoV-2 by FDA under an Emergency Use Authorization (EUA). This EUA will remain  in effect (meaning this test can be used) for the duration of the COVID-19 declaration under Section 564(b)(1) of the Act, 21 U.S.C.section 360bbb-3(b)(1), unless the authorization is terminated  or revoked sooner.       Influenza A by PCR NEGATIVE NEGATIVE Final   Influenza B by PCR NEGATIVE NEGATIVE Final    Comment: (NOTE) The Xpert Xpress SARS-CoV-2/FLU/RSV plus assay is intended as an aid in the diagnosis of influenza from Nasopharyngeal swab specimens and should not be used as a sole basis for treatment. Nasal washings and aspirates are unacceptable for Xpert Xpress SARS-CoV-2/FLU/RSV testing.  Fact Sheet for Patients: bloggercourse.com  Fact Sheet for Healthcare Providers: seriousbroker.it  This test is not yet approved or cleared by the [place]  FDA and has been authorized for detection and/or diagnosis of SARS-CoV-2 by FDA under an Emergency Use Authorization (EUA). This EUA will remain in effect (meaning this test can be used)  for the duration of  the COVID-19 declaration under Section 564(b)(1) of the Act, 21 U.S.C. section 360bbb-3(b)(1), unless the authorization is terminated or revoked.     Resp Syncytial Virus by PCR NEGATIVE NEGATIVE Final    Comment: (NOTE) Fact Sheet for Patients: bloggercourse.com  Fact Sheet for Healthcare Providers: seriousbroker.it  This test is not yet approved or cleared by the [place]  FDA and has been authorized for detection and/or diagnosis of SARS-CoV-2 by FDA under an Emergency Use Authorization (EUA). This EUA will remain in effect (meaning this test can be used) for the duration of the COVID-19 declaration under Section 564(b)(1) of the Act, 21 U.S.C. section 360bbb-3(b)(1), unless the authorization is terminated or revoked.  Performed at [facility], [address] [place], [place] [address]   Culture, blood (single)     Status: None (Preliminary result)   Collection Time: 04/08/24  7:28 PM   Specimen: BLOOD RIGHT ARM  Result Value Ref Range Status   Specimen Description BLOOD RIGHT ARM  Final   Special Requests   Final    BOTTLES DRAWN AEROBIC AND ANAEROBIC Blood Culture adequate volume   Culture   Final    NO GROWTH < 12 HOURS Performed at [facility], [address] [place], [place] [address]    Report Status PENDING  Incomplete    Procedures and diagnostic studies:  DG Chest 2 View Result Date: 04/08/2024 CLINICAL DATA:  Cough with oxygen requirement. EXAM: CHEST - 2 VIEW COMPARISON:  08/17/2023 FINDINGS: Lungs are adequately inflated with minimal right base opacification likely atelectasis and less likely infection. No effusion. Cardiomediastinal silhouette and remainder of the exam is unchanged. IMPRESSION: Minimal right base opacification likely atelectasis and less likely infection. Electronically Signed   By: Toribio Agreste M.D.   On: 04/08/2024 14:23                LOS: 0 days   Jorey Dollard  [facility]   Pager on [email or web address]. If 7PM-7AM, please contact night-coverage at [email or web address]     04/09/2024, 11:30 AM

## 2024-04-09 NOTE — Evaluation (Signed)
 Physical Therapy Evaluation Patient Details Name: Crystal Miller MRN: [id number] DOB: 1945/12/31 Today's Date: 04/09/2024  History of Present Illness  78 y/o female presented to ED on 04/08/24 for weakness, chest pain, fever, cough, and SOB. Admitted for leukocytosis and possible PNA. PMH: CVA, hypertension, GERD. DM, CKD stage 3b  Clinical Impression  Patient admitted with the above. PTA, patient lives with daughter and was utilizing a rollator at baseline in the home and outdoors. Unable to ambulate long distances recently due to fatigue. Patient presents with weakness, impaired balance, and decreased activity tolerance. Patient functioning at CGA-minA for mobility with RW throughout session. SpO2 dropping to 85% with activity but able to increase with pursed lip breathing with frequent cueing. Donned 1L O2 at end of session for comfort. Patient will benefit from skilled PT services during acute stay to address listed deficits. Patient will benefit from ongoing therapy at discharge to maximize functional independence and safety.         If plan is discharge home, recommend the following: A little help with walking and/or transfers;A little help with bathing/dressing/bathroom;Assistance with cooking/housework;Assist for transportation;Help with stairs or ramp for entrance   Can travel by private vehicle        Equipment Recommendations None recommended by PT  Recommendations for Other Services       Functional Status Assessment Patient has had a recent decline in their functional status and demonstrates the ability to make significant improvements in function in a reasonable and predictable amount of time.     Precautions / Restrictions Precautions Precautions: Fall Recall of Precautions/Restrictions: Intact Restrictions Weight Bearing Restrictions Per Provider Order: No      Mobility  Bed Mobility Overal bed mobility: Needs Assistance Bed Mobility: Supine to Sit, Sit to Supine      Supine to sit: HOB elevated, Used rails, Min assist, Contact guard Sit to supine: Contact guard assist        Transfers Overall transfer level: Needs assistance Equipment used: Rolling Leandrea Ackley (2 wheels) Transfers: Sit to/from Stand Sit to Stand: From elevated surface, Min assist           General transfer comment: cues for hand placement, min A to come into standing    Ambulation/Gait Ambulation/Gait assistance: Contact guard assist Gait Distance (Feet): 20 Feet Assistive device: Rolling Cali Cuartas (2 wheels) Gait Pattern/deviations: Step-through pattern, Decreased stride length Gait velocity: decreased     General Gait Details: CGA for safety. Patient rating exertion 9/10 upon sitting on EOB. SpO2 85% with ambulation on RA  Stairs            Wheelchair Mobility     Tilt Bed    Modified Rankin (Stroke Patients Only)       Balance Overall balance assessment: Needs assistance Sitting-balance support: Feet supported, No upper extremity supported Sitting balance-Leahy Scale: Good     Standing balance support: During functional activity Standing balance-Leahy Scale: Fair                               Pertinent Vitals/Pain Pain Assessment Pain Assessment: No/denies pain    Home Living Family/patient expects to be discharged to:: Private residence Living Arrangements: Children Available Help at Discharge: Family;Available PRN/intermittently Type of Home: Apartment Home Access: Level entry       Home Layout: One level Home Equipment: Educational Psychologist (4 wheels);Toilet riser;BSC/3in1      Prior Function Prior Level of Function : Independent/Modified Independent;History of  Falls (last six months)             Mobility Comments: uses rollator for community mobility, occasionally uses in the apartment ADLs Comments: manages basic ADLs, daughter helps with IADLs     Extremity/Trunk Assessment   Upper Extremity Assessment Upper  Extremity Assessment: Defer to OT evaluation    Lower Extremity Assessment Lower Extremity Assessment: Generalized weakness       Communication   Communication Communication: No apparent difficulties    Cognition Arousal: Alert Behavior During Therapy: WFL for tasks assessed/performed   PT - Cognitive impairments: No apparent impairments                         Following commands: Intact       Cueing Cueing Techniques: Verbal cues     General Comments General comments (skin integrity, edema, etc.): on 1L O2 at start, 91%; removed O2 dropped to 85 with ambulating 10 feet; replaced O2 to 1L    Exercises     Assessment/Plan    PT Assessment Patient needs continued PT services  PT Problem List Decreased strength;Decreased activity tolerance;Decreased balance;Decreased mobility;Decreased safety awareness;Decreased knowledge of precautions;Cardiopulmonary status limiting activity       PT Treatment Interventions DME instruction;Gait training;Functional mobility training;Therapeutic activities;Balance training;Neuromuscular re-education;Therapeutic exercise;Patient/family education    PT Goals (Current goals can be found in the Care Plan section)  Acute Rehab PT Goals Patient Stated Goal: to get stronger PT Goal Formulation: With patient Time For Goal Achievement: 04/23/24 Potential to Achieve Goals: Good    Frequency Min 2X/week     Co-evaluation PT/OT/SLP Co-Evaluation/Treatment: Yes Reason for Co-Treatment: To address functional/ADL transfers;For patient/therapist safety PT goals addressed during session: Mobility/safety with mobility;Proper use of DME OT goals addressed during session: ADL's and self-care       AM-PAC PT 6 Clicks Mobility  Outcome Measure Help needed turning from your back to your side while in a flat bed without using bedrails?: A Little Help needed moving from lying on your back to sitting on the side of a flat bed without  using bedrails?: A Little Help needed moving to and from a bed to a chair (including a wheelchair)?: A Little Help needed standing up from a chair using your arms (e.g., wheelchair or bedside chair)?: A Little Help needed to walk in hospital room?: A Little Help needed climbing 3-5 steps with a railing? : A Little 6 Click Score: 18    End of Session   Activity Tolerance: Patient tolerated treatment well Patient left: in bed;with call bell/phone within reach;with bed alarm set Nurse Communication: Mobility status PT Visit Diagnosis: Unsteadiness on feet (R26.81);Muscle weakness (generalized) (M62.81);Other abnormalities of gait and mobility (R26.89)    Time: 9077-9053 PT Time Calculation (min) (ACUTE ONLY): 24 min   Charges:   PT Evaluation $PT Eval Moderate Complexity: 1 Mod PT Treatments $Therapeutic Activity: 8-22 mins PT General Charges $$ ACUTE PT VISIT: 1 Visit         Maryanne Finder, PT, DPT Physical Therapist - [facility]  [facility]   Deveron Shamoon A Irja Wheless 04/09/2024, 11:49 AM

## 2024-04-09 NOTE — Plan of Care (Signed)
   Problem: Education: Goal: Knowledge of General Education information will improve Description Including pain rating scale, medication(s)/side effects and non-pharmacologic comfort measures Outcome: Progressing

## 2024-04-10 DIAGNOSIS — J189 Pneumonia, unspecified organism: Secondary | ICD-10-CM | POA: Diagnosis not present

## 2024-04-10 LAB — CBC
HCT: 27.4 % — ABNORMAL LOW (ref 36.0–46.0)
Hemoglobin: 9.1 g/dL — ABNORMAL LOW (ref 12.0–15.0)
MCH: 30.6 pg (ref 26.0–34.0)
MCHC: 33.2 g/dL (ref 30.0–36.0)
MCV: 92.3 fL (ref 80.0–100.0)
Platelets: 332 K/uL (ref 150–400)
RBC: 2.97 MIL/uL — ABNORMAL LOW (ref 3.87–5.11)
RDW: 12.1 % (ref 11.5–15.5)
WBC: 11.9 K/uL — ABNORMAL HIGH (ref 4.0–10.5)
nRBC: 0 % (ref 0.0–0.2)

## 2024-04-10 LAB — BASIC METABOLIC PANEL WITH GFR
Anion gap: 10 (ref 5–15)
BUN: 21 mg/dL (ref 8–23)
CO2: 21 mmol/L — ABNORMAL LOW (ref 22–32)
Calcium: 8.7 mg/dL — ABNORMAL LOW (ref 8.9–10.3)
Chloride: 109 mmol/L (ref 98–111)
Creatinine, Ser: 1.29 mg/dL — ABNORMAL HIGH (ref 0.44–1.00)
GFR, Estimated: 42 mL/min — ABNORMAL LOW (ref >60)
Glucose, Bld: 111 mg/dL — ABNORMAL HIGH (ref 70–99)
Potassium: 4.8 mmol/L (ref 3.5–5.1)
Sodium: 140 mmol/L (ref 135–145)

## 2024-04-10 MED ORDER — CARVEDILOL 6.25 MG PO TABS
6.2500 mg | ORAL_TABLET | Freq: Two times a day (BID) | ORAL | Status: DC
  Administered 2024-04-10 – 2024-04-11 (×3): 6.25 mg via ORAL
  Filled 2024-04-10 (×3): qty 1

## 2024-04-10 MED ORDER — AMLODIPINE BESYLATE 10 MG PO TABS
10.0000 mg | ORAL_TABLET | Freq: Every day | ORAL | Status: DC
  Administered 2024-04-10 – 2024-04-11 (×2): 10 mg via ORAL
  Filled 2024-04-10 (×2): qty 1

## 2024-04-10 MED ORDER — ENSURE PLUS HIGH PROTEIN PO LIQD: 237.0000 mL | Freq: Two times a day (BID) | ORAL | Status: DC

## 2024-04-10 MED ORDER — VITAMIN B-12 1000 MCG PO TABS
1000.0000 ug | ORAL_TABLET | Freq: Every day | ORAL | Status: DC
  Administered 2024-04-10 – 2024-04-11 (×2): 1000 ug via ORAL
  Filled 2024-04-10 (×2): qty 1

## 2024-04-10 MED ORDER — ATORVASTATIN CALCIUM 20 MG PO TABS
40.0000 mg | ORAL_TABLET | Freq: Every day | ORAL | Status: DC
  Administered 2024-04-10 – 2024-04-11 (×2): 40 mg via ORAL
  Filled 2024-04-10 (×2): qty 2

## 2024-04-10 MED ORDER — SERTRALINE HCL 50 MG PO TABS
25.0000 mg | ORAL_TABLET | Freq: Every day | ORAL | Status: DC
  Administered 2024-04-10 – 2024-04-11 (×2): 25 mg via ORAL
  Filled 2024-04-10 (×2): qty 1

## 2024-04-10 MED ORDER — PANTOPRAZOLE SODIUM 40 MG PO TBEC
40.0000 mg | DELAYED_RELEASE_TABLET | Freq: Every day | ORAL | Status: DC
  Administered 2024-04-10 – 2024-04-11 (×2): 40 mg via ORAL
  Filled 2024-04-10 (×2): qty 1

## 2024-04-10 MED ORDER — OXYBUTYNIN CHLORIDE ER 5 MG PO TB24
5.0000 mg | ORAL_TABLET | Freq: Every evening | ORAL | Status: DC
  Administered 2024-04-10: 5 mg via ORAL
  Filled 2024-04-10 (×2): qty 1

## 2024-04-10 MED ORDER — ASPIRIN 81 MG PO TBEC
81.0000 mg | DELAYED_RELEASE_TABLET | Freq: Every day | ORAL | Status: DC
  Administered 2024-04-10 – 2024-04-11 (×2): 81 mg via ORAL
  Filled 2024-04-10 (×2): qty 1

## 2024-04-10 NOTE — Progress Notes (Signed)
 Progress Note    Crystal Miller  FMW:[id number] DOB: [birth year]  DOA: 04/08/2024 PCP: Abbey Bruckner, MD      Brief Narrative:    Medical records reviewed and are as summarized below:  Crystal Miller is a 78 y.o. female with medical history significant for diabetes mellitus, hypertension, chronic anemia, who presented to the hospital with cough, shortness of breath and general weakness.  She says she has been coughing for about a month.  She was given some cough medicine and prednisone  about a month ago.  She said she has completed these medicines but she was still having symptoms.  She felt weaker and weaker.  Cough was associated with pleuritic chest pain.  Reportedly, she had a fever with temperature of 101 F at home.  She went to the urgent care clinic and she was referred to the ED for further management.  Chest x-ray  IMPRESSION: Minimal right base opacification likely atelectasis and less likely infection.      Assessment/Plan:   Principal Problem:   Pneumonia   Body mass index is 28.17 kg/m.   Community-acquired pneumonia, leukocytosis: Continue IV azithromycin  and ceftriaxone .  No growth on blood cultures thus far.   She is tolerating room air.     Abnormal urinalysis, pyuria: Patient is asymptomatic.  She is already on antibiotics.   Type II DM: NovoLog  as needed for hyperglycemia per sliding scale. Trulicity  on hold   Comorbidities include hypertension, CKD stage IIIb, history of stroke on aspirin    General weakness: PT recommended home health therapy.   Plan to discharge home tomorrow.   Diet Order             Diet regular Room service appropriate? Yes; Fluid consistency: Thin  Diet effective now                                  Consultants: None  Procedures: None    Medications:    amLODipine   10 mg Oral Daily   aspirin  EC  81 mg Oral Daily   atorvastatin   40 mg Oral Daily   azithromycin   500 mg Oral Daily    carvedilol   6.25 mg Oral BID WC   cyanocobalamin   1,000 mcg Oral Daily   enoxaparin  (LOVENOX ) injection  30 mg Subcutaneous Q24H   feeding supplement  237 mL Oral BID BM   oxybutynin   5 mg Oral QPM   pantoprazole   40 mg Oral Daily   sertraline   25 mg Oral Daily   Continuous Infusions:  cefTRIAXone  (ROCEPHIN )  IV Stopped (04/09/24 1631)     Anti-infectives (From admission, onward)    Start     Dose/Rate Route Frequency Ordered Stop   04/09/24 1700  cefTRIAXone  (ROCEPHIN ) 2 g in sodium chloride  0.9 % 100 mL IVPB        2 g 200 mL/hr over 30 Minutes Intravenous Every 24 hours 04/08/24 1749 04/13/24 1659   04/09/24 1000  azithromycin  (ZITHROMAX ) tablet 500 mg        500 mg Oral Daily 04/08/24 1741 04/13/24 0959   04/09/24 0000  cefTRIAXone  (ROCEPHIN ) 2 g in sodium chloride  0.9 % 100 mL IVPB  Status:  Discontinued        2 g 200 mL/hr over 30 Minutes Intravenous Every 24 hours 04/08/24 1741 04/08/24 1749   04/08/24 1715  cefTRIAXone  (ROCEPHIN ) 1 g in sodium chloride  0.9 % 100 mL IVPB  1 g 200 mL/hr over 30 Minutes Intravenous  Once 04/08/24 1703 04/08/24 1819   04/08/24 1715  azithromycin  (ZITHROMAX ) 500 mg in sodium chloride  0.9 % 250 mL IVPB        500 mg 250 mL/hr over 60 Minutes Intravenous  Once 04/08/24 1703 04/08/24 1831              Family Communication/Anticipated D/C date and plan/Code Status   DVT prophylaxis: enoxaparin  (LOVENOX ) injection 30 mg Start: 04/08/24 2200     Code Status: Limited: Do not attempt resuscitation (DNR) -DNR-LIMITED -Do Not Intubate/DNI   Family Communication: None Disposition Plan: Plan to discharge home   Status is: Inpatient Remains inpatient appropriate because: Pneumonia       Subjective:   Interval events noted.  She complains of low energy.  She still has a cough.  Breathing and chest pain are slowly improving.   Objective:    Vitals:   04/09/24 2003 04/10/24 0446 04/10/24 0725 04/10/24 1610  BP: (!)  120/55 (!) 154/55 (!) 148/40 (!) 148/56  Pulse: 82 86 80 85  Resp:  17 15 16   Temp: 99.1 F (37.3 C) 98.7 F (37.1 C) 98.7 F (37.1 C) 98 F (36.7 C)  TempSrc: Oral Oral    SpO2: 94% 92% 93% 95%  Weight:      Height:       No data found.   Intake/Output Summary (Last 24 hours) at 04/10/2024 1618 Last data filed at 04/10/2024 1300 Gross per 24 hour  Intake 460 ml  Output --  Net 460 ml   Filed Weights   04/08/24 1309  Weight: 61.1 kg    Exam:  GEN: NAD SKIN: Warm and dry EYES: No pallor or icterus ENT: MMM CV: RRR PULM: Mild bibasilar rales.  No rhonchi or wheezing ABD: soft, ND, NT, +BS CNS: AAO x 3, non focal EXT: No edema or tenderness       Data Reviewed:   I have personally reviewed following labs and imaging studies:  Labs: Labs show the following:   Basic Metabolic Panel: Recent Labs  Lab 04/08/24 1311 04/09/24 0522 04/10/24 0400  NA 133* 135 140  K 4.9 4.7 4.8  CL 104 106 109  CO2 16* 20* 21*  GLUCOSE 179* 103* 111*  BUN 22 20 21   CREATININE 1.56* 1.36* 1.29*  CALCIUM  8.8* 8.6* 8.7*   GFR Estimated Creatinine Clearance: 27.4 mL/min (A) (by C-G formula based on SCr of 1.29 mg/dL (H)). Liver Function Tests: Recent Labs  Lab 04/08/24 1311 04/09/24 0522  AST 21 16  ALT 16 12  ALKPHOS 105 101  BILITOT 0.5 0.3  PROT 7.2 6.4*  ALBUMIN 3.4* 3.0*   No results for input(s): LIPASE, AMYLASE in the last 168 hours. No results for input(s): AMMONIA in the last 168 hours. Coagulation profile No results for input(s): INR, PROTIME in the last 168 hours.  CBC: Recent Labs  Lab 04/08/24 1311 04/09/24 0522 04/10/24 0400  WBC 23.8* 14.7* 11.9*  HGB 10.0* 9.1* 9.1*  HCT 29.8* 26.4* 27.4*  MCV 91.7 91.0 92.3  PLT 359 314 332   Cardiac Enzymes: No results for input(s): CKTOTAL, CKMB, CKMBINDEX, TROPONINI in the last 168 hours. BNP (last 3 results) No results for input(s): PROBNP in the last 8760 hours. CBG: No  results for input(s): GLUCAP in the last 168 hours. D-Dimer: No results for input(s): DDIMER in the last 72 hours. Hgb A1c: No results for input(s): HGBA1C in the last 72  hours. Lipid Profile: No results for input(s): CHOL, HDL, LDLCALC, TRIG, CHOLHDL, LDLDIRECT in the last 72 hours. Thyroid  function studies: No results for input(s): TSH, T4TOTAL, T3FREE, THYROIDAB in the last 72 hours.  Invalid input(s): FREET3 Anemia work up: No results for input(s): VITAMINB12, FOLATE, FERRITIN, TIBC, IRON, RETICCTPCT in the last 72 hours. Sepsis Labs: Recent Labs  Lab 04/08/24 1311 04/08/24 1730 04/08/24 1926 04/09/24 0522 04/10/24 0400  PROCALCITON  --   --  0.43  --   --   WBC 23.8*  --   --  14.7* 11.9*  LATICACIDVEN  --  1.5  --   --   --     Microbiology Recent Results (from the past 240 hours)  Blood culture (routine x 2)     Status: None (Preliminary result)   Collection Time: 04/08/24  5:31 PM   Specimen: BLOOD  Result Value Ref Range Status   Specimen Description BLOOD BLOOD RIGHT WRIST  Final   Special Requests   Final    BOTTLES DRAWN AEROBIC AND ANAEROBIC Blood Culture results may not be optimal due to an inadequate volume of blood received in culture bottles   Culture   Final    NO GROWTH 2 DAYS Performed at [facility], [address] [place], [place] [address]    Report Status PENDING  Incomplete  Blood culture (routine x 2)     Status: None (Preliminary result)   Collection Time: 04/08/24  5:36 PM   Specimen: BLOOD  Result Value Ref Range Status   Specimen Description BLOOD LEFT ANTECUBITAL  Final   Special Requests   Final    BOTTLES DRAWN AEROBIC AND ANAEROBIC Blood Culture adequate volume   Culture   Final    NO GROWTH 2 DAYS Performed at [facility], [address], [place], [place] [address]    Report Status PENDING  Incomplete  Resp panel by RT-PCR (RSV, Flu A&B, Covid) Anterior Nasal Swab      Status: None   Collection Time: 04/08/24  5:40 PM   Specimen: Anterior Nasal Swab  Result Value Ref Range Status   SARS Coronavirus 2 by RT PCR NEGATIVE NEGATIVE Final    Comment: (NOTE) SARS-CoV-2 target nucleic acids are NOT DETECTED.  The SARS-CoV-2 RNA is generally detectable in upper respiratory specimens during the acute phase of infection. The lowest concentration of SARS-CoV-2 viral copies this assay can detect is 138 copies/mL. A negative result does not preclude SARS-Cov-2 infection and should not be used as the sole basis for treatment or other patient management decisions. A negative result may occur with  improper specimen collection/handling, submission of specimen other than nasopharyngeal swab, presence of viral mutation(s) within the areas targeted by this assay, and inadequate number of viral copies(<138 copies/mL). A negative result must be combined with clinical observations, patient history, and epidemiological information. The expected result is Negative.  Fact Sheet for Patients:  bloggercourse.com  Fact Sheet for Healthcare Providers:  seriousbroker.it  This test is no t yet approved or cleared by the [place]  FDA and  has been authorized for detection and/or diagnosis of SARS-CoV-2 by FDA under an Emergency Use Authorization (EUA). This EUA will remain  in effect (meaning this test can be used) for the duration of the COVID-19 declaration under Section 564(b)(1) of the Act, 21 U.S.C.section 360bbb-3(b)(1), unless the authorization is terminated  or revoked sooner.       Influenza A by PCR NEGATIVE NEGATIVE Final   Influenza B  by PCR NEGATIVE NEGATIVE Final    Comment: (NOTE) The Xpert Xpress SARS-CoV-2/FLU/RSV plus assay is intended as an aid in the diagnosis of influenza from Nasopharyngeal swab specimens and should not be used as a sole basis for treatment. Nasal washings and aspirates are  unacceptable for Xpert Xpress SARS-CoV-2/FLU/RSV testing.  Fact Sheet for Patients: bloggercourse.com  Fact Sheet for Healthcare Providers: seriousbroker.it  This test is not yet approved or cleared by the [place]  FDA and has been authorized for detection and/or diagnosis of SARS-CoV-2 by FDA under an Emergency Use Authorization (EUA). This EUA will remain in effect (meaning this test can be used) for the duration of the COVID-19 declaration under Section 564(b)(1) of the Act, 21 U.S.C. section 360bbb-3(b)(1), unless the authorization is terminated or revoked.     Resp Syncytial Virus by PCR NEGATIVE NEGATIVE Final    Comment: (NOTE) Fact Sheet for Patients: bloggercourse.com  Fact Sheet for Healthcare Providers: seriousbroker.it  This test is not yet approved or cleared by the [place]  FDA and has been authorized for detection and/or diagnosis of SARS-CoV-2 by FDA under an Emergency Use Authorization (EUA). This EUA will remain in effect (meaning this test can be used) for the duration of the COVID-19 declaration under Section 564(b)(1) of the Act, 21 U.S.C. section 360bbb-3(b)(1), unless the authorization is terminated or revoked.  Performed at [facility], [address] [place], [place] [address]   Culture, blood (single)     Status: None (Preliminary result)   Collection Time: 04/08/24  7:28 PM   Specimen: BLOOD RIGHT ARM  Result Value Ref Range Status   Specimen Description BLOOD RIGHT ARM  Final   Special Requests   Final    BOTTLES DRAWN AEROBIC AND ANAEROBIC Blood Culture adequate volume   Culture   Final    NO GROWTH 2 DAYS Performed at [facility], [address] [place], [place] [address]    Report Status PENDING  Incomplete  MRSA Next Gen by PCR, Nasal     Status: None   Collection Time: 04/09/24  4:05 PM   Specimen: Nasal  Mucosa; Nasal Swab  Result Value Ref Range Status   MRSA by PCR Next Gen NOT DETECTED NOT DETECTED Final    Comment: (NOTE) The GeneXpert MRSA Assay (FDA approved for NASAL specimens only), is one component of a comprehensive MRSA colonization surveillance program. It is not intended to diagnose MRSA infection nor to guide or monitor treatment for MRSA infections. Test performance is not FDA approved in patients less than 32 years old. Performed at [facility], [address] [place], [place] [address]     Procedures and diagnostic studies:  No results found.              LOS: 1 day   Keeton Kassebaum  [facility]   Pager on [email or web address]. If 7PM-7AM, please contact night-coverage at [email or web address]     04/10/2024, 4:18 PM

## 2024-04-10 NOTE — Plan of Care (Signed)
  Problem: Education: Goal: Knowledge of General Education information will improve Description: Including pain rating scale, medication(s)/side effects and non-pharmacologic comfort measures Outcome: Progressing   Problem: Health Behavior/Discharge Planning: Goal: Ability to manage health-related needs will improve Outcome: Progressing   Problem: Clinical Measurements: Goal: Ability to maintain clinical measurements within normal limits will improve Outcome: Progressing Goal: Will remain free from infection Outcome: Progressing Goal: Diagnostic test results will improve Outcome: Progressing Goal: Respiratory complications will improve Outcome: Progressing Goal: Cardiovascular complication will be avoided Outcome: Progressing   Problem: Activity: Goal: Risk for activity intolerance will decrease Outcome: Progressing   Problem: Nutrition: Goal: Adequate nutrition will be maintained Outcome: Progressing   Problem: Elimination: Goal: Will not experience complications related to bowel motility Outcome: Progressing Goal: Will not experience complications related to urinary retention Outcome: Progressing   Problem: Pain Managment: Goal: General experience of comfort will improve and/or be controlled Outcome: Progressing   Problem: Safety: Goal: Ability to remain free from injury will improve Outcome: Progressing   Problem: Skin Integrity: Goal: Risk for impaired skin integrity will decrease Outcome: Progressing   Problem: Activity: Goal: Ability to tolerate increased activity will improve Outcome: Progressing   Problem: Clinical Measurements: Goal: Ability to maintain a body temperature in the normal range will improve Outcome: Progressing   Problem: Respiratory: Goal: Ability to maintain adequate ventilation will improve Outcome: Progressing Goal: Ability to maintain a clear airway will improve Outcome: Progressing

## 2024-04-10 NOTE — Plan of Care (Signed)
  Problem: Activity: Goal: Risk for activity intolerance will decrease Outcome: Progressing   Problem: Coping: Goal: Level of anxiety will decrease Outcome: Progressing   Problem: Elimination: Goal: Will not experience complications related to bowel motility Outcome: Progressing   Problem: Activity: Goal: Ability to tolerate increased activity will improve Outcome: Progressing   Problem: Respiratory: Goal: Ability to maintain adequate ventilation will improve Outcome: Progressing

## 2024-04-10 NOTE — Progress Notes (Signed)
 Physical Therapy Treatment Patient Details Name: Crystal Miller MRN: [id number] DOB: 08-28-1945 Today's Date: 04/10/2024   History of Present Illness 78 y/o female presented to ED on 04/08/24 for weakness, chest pain, fever, cough, and SOB. Admitted for leukocytosis and possible PNA. PMH: CVA, hypertension, GERD. DM, CKD stage 3b    PT Comments  Patient progressing towards physical therapy goals. Doffed 1L O2 at beginning of session. Completed bed mobility and sit to stand with RW and supervision. Ambulated 200' with RW and CGA progressing to supervision. SpO2 >90% throughout on RA. Patient rating exertion 4-5/10 at end of ambulation which is improved from previous session. Discharge plan remains appropriate.     If plan is discharge home, recommend the following: A little help with walking and/or transfers;A little help with bathing/dressing/bathroom;Assistance with cooking/housework;Assist for transportation;Help with stairs or ramp for entrance   Can travel by private vehicle        Equipment Recommendations  None recommended by PT    Recommendations for Other Services       Precautions / Restrictions Precautions Precautions: Fall Recall of Precautions/Restrictions: Intact Restrictions Weight Bearing Restrictions Per Provider Order: No     Mobility  Bed Mobility Overal bed mobility: Needs Assistance Bed Mobility: Supine to Sit     Supine to sit: Supervision          Transfers Overall transfer level: Needs assistance Equipment used: Rolling [name] (2 wheels) Transfers: Sit to/from Stand Sit to Stand: Supervision                Ambulation/Gait Ambulation/Gait assistance: Contact guard assist, Supervision Gait Distance (Feet): 200 Feet Assistive device: Rolling [name] (2 wheels) Gait Pattern/deviations: Step-through pattern, Decreased stride length Gait velocity: decreased     General Gait Details: CGA progressing to supervision for safety. SpO2 >90% on  RA   Stairs             Wheelchair Mobility     Tilt Bed    Modified Rankin (Stroke Patients Only)       Balance Overall balance assessment: Needs assistance Sitting-balance support: Feet supported, No upper extremity supported Sitting balance-Leahy Scale: Good     Standing balance support: During functional activity Standing balance-Leahy Scale: Fair                              Hotel Manager: No apparent difficulties  Cognition Arousal: Alert Behavior During Therapy: WFL for tasks assessed/performed   PT - Cognitive impairments: No apparent impairments                         Following commands: Intact      Cueing    Exercises      General Comments        Pertinent Vitals/Pain Pain Assessment Pain Assessment: No/denies pain    Home Living                          Prior Function            PT Goals (current goals can now be found in the care plan section) Acute Rehab PT Goals Patient Stated Goal: to return to independence PT Goal Formulation: With patient Time For Goal Achievement: 04/23/24 Potential to Achieve Goals: Good Progress towards PT goals: Progressing toward goals    Frequency    Min 2X/week  PT Plan      Co-evaluation              AM-PAC PT 6 Clicks Mobility   Outcome Measure  Help needed turning from your back to your side while in a flat bed without using bedrails?: A Little Help needed moving from lying on your back to sitting on the side of a flat bed without using bedrails?: A Little Help needed moving to and from a bed to a chair (including a wheelchair)?: A Little Help needed standing up from a chair using your arms (e.g., wheelchair or bedside chair)?: A Little Help needed to walk in hospital room?: A Little Help needed climbing 3-5 steps with a railing? : A Little 6 Click Score: 18    End of Session   Activity Tolerance: Patient  tolerated treatment well Patient left: in bed;with call bell/phone within reach;with family/visitor present (seated on EOB) Nurse Communication: Mobility status PT Visit Diagnosis: Unsteadiness on feet (R26.81);Muscle weakness (generalized) (M62.81);Other abnormalities of gait and mobility (R26.89)     Time: 8558-8492 PT Time Calculation (min) (ACUTE ONLY): 26 min  Charges:    $Therapeutic Activity: 23-37 mins PT General Charges $$ ACUTE PT VISIT: 1 Visit                     Crystal Miller, PT, DPT Physical Therapist - [facility]  [facility]    Crystal Miller 04/10/2024, 3:22 PM

## 2024-04-10 NOTE — TOC Progression Note (Signed)
 Transition of Care [facility]) - Progression Note    Patient Details  Name: Crystal Miller MRN: [id number] Date of Birth: [birth year]  Transition of Care [facility] CM/SW Contact  Racheal LITTIE Schimke, RN Phone Number: 04/10/2024, 4:02 PM  Clinical Narrative:  RNCM spoke with patient via phone about home health recommendation, she consents however prefer not to have [facility]. Requested a list to review, then will make decision.                       Expected Discharge Plan and Services                                               Social Drivers of Health (SDOH) Interventions SDOH Screenings   Food Insecurity: No Food Insecurity (04/08/2024)  Housing: Low Risk  (04/08/2024)  Transportation Needs: No Transportation Needs (04/08/2024)  Utilities: Not At Risk (04/08/2024)  Alcohol Screen: Low Risk  (12/06/2023)  Depression (PHQ2-9): Medium Risk (01/31/2024)  Financial Resource Strain: Low Risk  (12/06/2023)  Physical Activity: Sufficiently Active (12/06/2023)  Social Connections: Moderately Isolated (04/08/2024)  Stress: No Stress Concern Present (12/06/2023)  Tobacco Use: Medium Risk (04/08/2024)  Health Literacy: Adequate Health Literacy (12/06/2023)    Readmission Risk Interventions     No data to display

## 2024-04-11 DIAGNOSIS — J189 Pneumonia, unspecified organism: Secondary | ICD-10-CM | POA: Diagnosis not present

## 2024-04-11 LAB — CBC
HCT: 27.6 % — ABNORMAL LOW (ref 36.0–46.0)
Hemoglobin: 9.2 g/dL — ABNORMAL LOW (ref 12.0–15.0)
MCH: 30.6 pg (ref 26.0–34.0)
MCHC: 33.3 g/dL (ref 30.0–36.0)
MCV: 91.7 fL (ref 80.0–100.0)
Platelets: 360 K/uL (ref 150–400)
RBC: 3.01 MIL/uL — ABNORMAL LOW (ref 3.87–5.11)
RDW: 12.1 % (ref 11.5–15.5)
WBC: 9.7 K/uL (ref 4.0–10.5)
nRBC: 0 % (ref 0.0–0.2)

## 2024-04-11 LAB — RENAL FUNCTION PANEL
Albumin: 3.1 g/dL — ABNORMAL LOW (ref 3.5–5.0)
Anion gap: 9 (ref 5–15)
BUN: 25 mg/dL — ABNORMAL HIGH (ref 8–23)
CO2: 21 mmol/L — ABNORMAL LOW (ref 22–32)
Calcium: 8.8 mg/dL — ABNORMAL LOW (ref 8.9–10.3)
Chloride: 108 mmol/L (ref 98–111)
Creatinine, Ser: 1.28 mg/dL — ABNORMAL HIGH (ref 0.44–1.00)
GFR, Estimated: 42 mL/min — ABNORMAL LOW (ref >60)
Glucose, Bld: 111 mg/dL — ABNORMAL HIGH (ref 70–99)
Phosphorus: 4.1 mg/dL (ref 2.5–4.6)
Potassium: 4.6 mmol/L (ref 3.5–5.1)
Sodium: 139 mmol/L (ref 135–145)

## 2024-04-11 MED ORDER — AMOXICILLIN-POT CLAVULANATE 500-125 MG PO TABS: 1.0000 | ORAL_TABLET | Freq: Two times a day (BID) | ORAL | 0 refills | Status: AC

## 2024-04-11 MED ORDER — AMOXICILLIN-POT CLAVULANATE 500-125 MG PO TABS
1.0000 | ORAL_TABLET | Freq: Two times a day (BID) | ORAL | Status: DC
  Administered 2024-04-11: 1 via ORAL
  Filled 2024-04-11: qty 1

## 2024-04-11 MED ORDER — DICLOFENAC SODIUM 1 % EX GEL: 2.0000 g | Freq: Every day | CUTANEOUS | Status: AC | PRN

## 2024-04-11 NOTE — Discharge Summary (Incomplete)
 Physician Discharge Summary   Patient: Crystal Miller MRN: [id number] DOB: 1945/05/28  Admit date:     04/08/2024  Discharge date: {dischdate:26783}  Discharge Physician: AIDA CHO   PCP: Abbey Bruckner, MD   Recommendations at discharge:  {Tip this will not be part of the note when signed- Example include specific recommendations for outpatient follow-up, pending tests to follow-up on. (Optional):26781}  ***  Discharge Diagnoses: Principal Problem:   Pneumonia  Resolved Problems:   * No resolved hospital problems. [facility] Course: No notes on file  Assessment and Plan: No notes have been filed under this hospital service. Service: Hospitalist     {Tip this will not be part of the note when signed Body mass index is 28.17 kg/m. , ,  (Optional):26781}  {(NOTE) Pain control PDMP Statment (Optional):26782} Consultants: *** Procedures performed: ***  Disposition: {Plan; Disposition:26390} Diet recommendation:  Discharge Diet Orders (From admission, onward)     Start     Ordered   04/11/24 0000  Diet - low sodium heart healthy        04/11/24 1006           {Diet_Plan:26776} DISCHARGE MEDICATION: Allergies as of 04/11/2024       Reactions   Metformin  And Related Diarrhea        Medication List     STOP taking these medications    benzonatate  100 MG capsule Commonly known as: Tessalon  Perles       TAKE these medications    acetaminophen  500 MG tablet Commonly known as: TYLENOL  Take 1,000 mg by mouth every 6 (six) hours as needed (for pain.).   amLODipine  10 MG tablet Commonly known as: NORVASC  Take 1 tablet (10 mg total) by mouth daily.   amoxicillin -clavulanate 500-125 MG tablet Commonly known as: AUGMENTIN  Take 1 tablet by mouth 2 (two) times daily for 3 doses.   aspirin  EC 81 MG tablet Take 1 tablet (81 mg total) by mouth daily.   atorvastatin  40 MG tablet Commonly known as: LIPITOR TAKE 1 TABLET BY MOUTH ONCE DAILY AT   6PM What changed:  how much to take how to take this when to take this additional instructions   b complex-vitamin c-folic acid  0.8 MG Tabs tablet Take 1 tablet by mouth daily.   blood glucose meter kit and supplies Kit Dispense based on patient and insurance preference. Use up to four times daily as directed. (FOR ICD-9 250.00, 250.01).   carvedilol  6.25 MG tablet Commonly known as: COREG  Take 1 tablet (6.25 mg total) by mouth 2 (two) times daily with a meal.   cyanocobalamin  1000 MCG tablet Commonly known as: VITAMIN B12 Take 1,000 mcg by mouth daily.   diclofenac  Sodium 1 % Gel Commonly known as: VOLTAREN  Apply 2 g topically daily as needed.   omeprazole  20 MG capsule Commonly known as: PRILOSEC Take 1 capsule (20 mg total) by mouth daily.   oxybutynin  5 MG 24 hr tablet Commonly known as: DITROPAN -XL 5 mg every evening.   sertraline  25 MG tablet Commonly known as: ZOLOFT  Take 1 tablet (25 mg total) by mouth daily.   Trulicity  0.75 MG/0.5ML Soaj Generic drug: Dulaglutide  Inject 0.75 mg into the skin once a week.        Discharge Exam: Filed Weights   04/08/24 1309  Weight: 61.1 kg   ***  Condition at discharge: {DC Condition:26389}  The results of significant diagnostics from this hospitalization (including imaging, microbiology, ancillary and laboratory) are listed below for reference.  Imaging Studies: DG Chest 2 View Result Date: 04/08/2024 CLINICAL DATA:  Cough with oxygen requirement. EXAM: CHEST - 2 VIEW COMPARISON:  08/17/2023 FINDINGS: Lungs are adequately inflated with minimal right base opacification likely atelectasis and less likely infection. No effusion. Cardiomediastinal silhouette and remainder of the exam is unchanged. IMPRESSION: Minimal right base opacification likely atelectasis and less likely infection. Electronically Signed   By: Toribio Agreste M.D.   On: 04/08/2024 14:23    Microbiology: Results for orders placed or performed  during the hospital encounter of 04/08/24  Blood culture (routine x 2)     Status: None (Preliminary result)   Collection Time: 04/08/24  5:31 PM   Specimen: BLOOD  Result Value Ref Range Status   Specimen Description BLOOD BLOOD RIGHT WRIST  Final   Special Requests   Final    BOTTLES DRAWN AEROBIC AND ANAEROBIC Blood Culture results may not be optimal due to an inadequate volume of blood received in culture bottles   Culture   Final    NO GROWTH 3 DAYS Performed at [facility], [address] [place], [place] [address]    Report Status PENDING  Incomplete  Blood culture (routine x 2)     Status: None (Preliminary result)   Collection Time: 04/08/24  5:36 PM   Specimen: BLOOD  Result Value Ref Range Status   Specimen Description BLOOD LEFT ANTECUBITAL  Final   Special Requests   Final    BOTTLES DRAWN AEROBIC AND ANAEROBIC Blood Culture adequate volume   Culture   Final    NO GROWTH 3 DAYS Performed at [facility], [address], [place], [place] [address]    Report Status PENDING  Incomplete  Resp panel by RT-PCR (RSV, Flu A&B, Covid) Anterior Nasal Swab     Status: None   Collection Time: 04/08/24  5:40 PM   Specimen: Anterior Nasal Swab  Result Value Ref Range Status   SARS Coronavirus 2 by RT PCR NEGATIVE NEGATIVE Final    Comment: (NOTE) SARS-CoV-2 target nucleic acids are NOT DETECTED.  The SARS-CoV-2 RNA is generally detectable in upper respiratory specimens during the acute phase of infection. The lowest concentration of SARS-CoV-2 viral copies this assay can detect is 138 copies/mL. A negative result does not preclude SARS-Cov-2 infection and should not be used as the sole basis for treatment or other patient management decisions. A negative result may occur with  improper specimen collection/handling, submission of specimen other than nasopharyngeal swab, presence of viral mutation(s) within the areas targeted by this assay, and inadequate  number of viral copies(<138 copies/mL). A negative result must be combined with clinical observations, patient history, and epidemiological information. The expected result is Negative.  Fact Sheet for Patients:  bloggercourse.com  Fact Sheet for Healthcare Providers:  seriousbroker.it  This test is no t yet approved or cleared by the [place]  FDA and  has been authorized for detection and/or diagnosis of SARS-CoV-2 by FDA under an Emergency Use Authorization (EUA). This EUA will remain  in effect (meaning this test can be used) for the duration of the COVID-19 declaration under Section 564(b)(1) of the Act, 21 U.S.C.section 360bbb-3(b)(1), unless the authorization is terminated  or revoked sooner.       Influenza A by PCR NEGATIVE NEGATIVE Final   Influenza B by PCR NEGATIVE NEGATIVE Final    Comment: (NOTE) The Xpert Xpress SARS-CoV-2/FLU/RSV plus assay is intended as an aid in the diagnosis of influenza from Nasopharyngeal swab specimens and  should not be used as a sole basis for treatment. Nasal washings and aspirates are unacceptable for Xpert Xpress SARS-CoV-2/FLU/RSV testing.  Fact Sheet for Patients: bloggercourse.com  Fact Sheet for Healthcare Providers: seriousbroker.it  This test is not yet approved or cleared by the [place]  FDA and has been authorized for detection and/or diagnosis of SARS-CoV-2 by FDA under an Emergency Use Authorization (EUA). This EUA will remain in effect (meaning this test can be used) for the duration of the COVID-19 declaration under Section 564(b)(1) of the Act, 21 U.S.C. section 360bbb-3(b)(1), unless the authorization is terminated or revoked.     Resp Syncytial Virus by PCR NEGATIVE NEGATIVE Final    Comment: (NOTE) Fact Sheet for Patients: bloggercourse.com  Fact Sheet for Healthcare  Providers: seriousbroker.it  This test is not yet approved or cleared by the [place]  FDA and has been authorized for detection and/or diagnosis of SARS-CoV-2 by FDA under an Emergency Use Authorization (EUA). This EUA will remain in effect (meaning this test can be used) for the duration of the COVID-19 declaration under Section 564(b)(1) of the Act, 21 U.S.C. section 360bbb-3(b)(1), unless the authorization is terminated or revoked.  Performed at [facility], [address] [place], [place] [address]   Culture, blood (single)     Status: None (Preliminary result)   Collection Time: 04/08/24  7:28 PM   Specimen: BLOOD RIGHT ARM  Result Value Ref Range Status   Specimen Description BLOOD RIGHT ARM  Final   Special Requests   Final    BOTTLES DRAWN AEROBIC AND ANAEROBIC Blood Culture adequate volume   Culture   Final    NO GROWTH 3 DAYS Performed at [facility], [address], [place], [place] [address]    Report Status PENDING  Incomplete  MRSA Next Gen by PCR, Nasal     Status: None   Collection Time: 04/09/24  4:05 PM   Specimen: Nasal Mucosa; Nasal Swab  Result Value Ref Range Status   MRSA by PCR Next Gen NOT DETECTED NOT DETECTED Final    Comment: (NOTE) The GeneXpert MRSA Assay (FDA approved for NASAL specimens only), is one component of a comprehensive MRSA colonization surveillance program. It is not intended to diagnose MRSA infection nor to guide or monitor treatment for MRSA infections. Test performance is not FDA approved in patients less than 17 years old. Performed at [facility], [address] [place], [place] [address]     Labs: CBC: Recent Labs  Lab 04/08/24 1311 04/09/24 0522 04/10/24 0400 04/11/24 0557  WBC 23.8* 14.7* 11.9* 9.7  HGB 10.0* 9.1* 9.1* 9.2*  HCT 29.8* 26.4* 27.4* 27.6*  MCV 91.7 91.0 92.3 91.7  PLT 359 314 332 360   Basic Metabolic Panel: Recent Labs  Lab  04/08/24 1311 04/09/24 0522 04/10/24 0400 04/11/24 0557  NA 133* 135 140 139  K 4.9 4.7 4.8 4.6  CL 104 106 109 108  CO2 16* 20* 21* 21*  GLUCOSE 179* 103* 111* 111*  BUN 22 20 21  25*  CREATININE 1.56* 1.36* 1.29* 1.28*  CALCIUM  8.8* 8.6* 8.7* 8.8*  PHOS  --   --   --  4.1   Liver Function Tests: Recent Labs  Lab 04/08/24 1311 04/09/24 0522 04/11/24 0557  AST 21 16  --   ALT 16 12  --   ALKPHOS 105 101  --   BILITOT 0.5 0.3  --   PROT 7.2 6.4*  --   ALBUMIN 3.4* 3.0*  3.1*   CBG: No results for input(s): GLUCAP in the last 168 hours.  Discharge time spent: {LESS THAN/GREATER UYJW:73611} 30 minutes.  Signed: AIDA CHO, MD [facility] 04/11/2024

## 2024-04-11 NOTE — Plan of Care (Signed)
  Problem: Clinical Measurements: Goal: Ability to maintain clinical measurements within normal limits will improve Outcome: Progressing   Problem: Activity: Goal: Risk for activity intolerance will decrease Outcome: Progressing   Problem: Elimination: Goal: Will not experience complications related to bowel motility Outcome: Progressing   Problem: Pain Managment: Goal: General experience of comfort will improve and/or be controlled Outcome: Progressing   Problem: Respiratory: Goal: Ability to maintain adequate ventilation will improve Outcome: Progressing

## 2024-04-13 ENCOUNTER — Telehealth: Payer: Self-pay

## 2024-04-13 LAB — CULTURE, BLOOD (SINGLE)
Culture: NO GROWTH
Special Requests: ADEQUATE

## 2024-04-13 LAB — CULTURE, BLOOD (ROUTINE X 2)
Culture: NO GROWTH
Culture: NO GROWTH
Special Requests: ADEQUATE

## 2024-04-13 NOTE — Transitions of Care (Post Inpatient/ED Visit) (Signed)
 04/13/2024  Name: Crystal Miller MRN: [id number] DOB: 05-Feb-1946  Today's TOC FU Call Status: Today's TOC FU Call Status:: Successful TOC FU Call Completed TOC FU Call Complete Date: 04/13/24  Patient's Name and Date of Birth confirmed. DOB, Name  Transition Care Management Follow-up Telephone Call Date of Discharge: 04/11/24 Discharge Facility: [facility]) Type of Discharge: Inpatient Admission Primary Inpatient Discharge Diagnosis:: Pneumonia How have you been since you were released from the hospital?: Better Any questions or concerns?: No  Items Reviewed: Did you receive and understand the discharge instructions provided?: No Medications obtained,verified, and reconciled?: Yes (Medications Reviewed) Any new allergies since your discharge?: No Dietary orders reviewed?: Yes Type of Diet Ordered:: diabetes Do you have support at home?: Yes People in Home [RPT]: child(ren), adult Name of Support/Comfort Primary Source: lives with adult daughter and adult son is staying with her while daughter works  Medications Reviewed Today: Medications Reviewed Today     Reviewed by Lauro Shona LABOR, RN (Registered Nurse) on 04/13/24 at 1343  Med List Status: <None>   Medication Order Taking? Sig Documenting Provider Last Dose Status Informant  acetaminophen  (TYLENOL ) 500 MG tablet [id number] Yes Take 1,000 mg by mouth every 6 (six) hours as needed (for pain.). [provider]  Active Self           Med Note SANDOR, CATHERINE E   Mon Sep 07, 2019  1:21 PM)    amLODipine  (NORVASC ) 10 MG tablet [id number] Yes Take 1 tablet (10 mg total) by mouth daily. Bair, Kalpana, MD  Active Self  amoxicillin -clavulanate (AUGMENTIN ) 500-125 MG tablet [id number] Yes Take 1 tablet by mouth 2 (two) times daily for 3 doses. Jens Durand, MD  Active   aspirin  EC 81 MG EC tablet [id number] Yes Take 1 tablet (81 mg total) by mouth daily. Vachhani, Vaibhavkumar, MD  Active Self   atorvastatin  (LIPITOR) 40 MG tablet [id number] Yes TAKE 1 TABLET BY MOUTH ONCE DAILY AT  [facility], Kalpana, MD  Active Self  b complex-vitamin c-folic acid  (NEPHRO-VITE) 0.8 MG TABS tablet [id number] Yes Take 1 tablet by mouth daily. [provider]  Active   blood glucose meter kit and supplies KIT [id number] Yes Dispense based on patient and insurance preference. Use up to four times daily as directed. (FOR ICD-9 250.00, 250.01). Yvonna Alm Cough, MD  Active Self  carvedilol  (COREG ) 6.25 MG tablet [id number] Yes Take 1 tablet (6.25 mg total) by mouth 2 (two) times daily with a meal. Bair, Kalpana, MD  Active Self  cyanocobalamin  (VITAMIN B12) 1000 MCG tablet [id number] Yes Take 1,000 mcg by mouth daily. [provider]  Active   diclofenac  Sodium (VOLTAREN ) 1 % GEL [id number] Yes Apply 2 g topically daily as needed. Jens Durand, MD  Active   Dulaglutide  (TRULICITY ) 0.75 MG/0.5ML EMMANUEL [id number] Yes Inject 0.75 mg into the skin once a week. Bair, Luke, MD  Active Self  omeprazole  (PRILOSEC) 20 MG capsule [id number] Yes Take 1 capsule (20 mg total) by mouth daily. Zehr, Jessica D, PA-C  Active Self  oxybutynin  (DITROPAN -XL) 5 MG 24 hr tablet [id number] Yes 5 mg every evening. [provider]  Active Self  sertraline  (ZOLOFT ) 25 MG tablet [id number] Yes Take 1 tablet (25 mg total) by mouth daily. Abbey Luke, MD  Active Self            Home Care and Equipment/Supplies: Were Home Health Services Ordered?: Yes Name of Home Health Agency:: [facility] Has Agency  set up a time to come to your home?: No Any new equipment or medical supplies ordered?: No  Functional Questionnaire: Do you need assistance with bathing/showering or dressing?: No Do you need assistance with meal preparation?: Yes (usually independent but getting a little help now) Do you need assistance with eating?: No Do you have difficulty maintaining continence: Yes (reports bladder  incintinence has bedside commode for nights) Do you need assistance with getting out of bed/getting out of a chair/moving?: No Do you have difficulty managing or taking your medications?: No  Follow up appointments reviewed: PCP Follow-up appointment confirmed?: Yes Date of PCP follow-up appointment?: 04/14/24 Follow-up Provider: PCP Specialist Hospital Follow-up appointment confirmed?: NA Do you need transportation to your follow-up appointment?: No Do you understand care options if your condition(s) worsen?: Yes-patient verbalized understanding  SDOH Interventions Today    Flowsheet Row Most Recent Value  SDOH Interventions   Food Insecurity Interventions Intervention Not Indicated  Housing Interventions Intervention Not Indicated  Transportation Interventions Intervention Not Indicated  Utilities Interventions Intervention Not Indicated    Goals Addressed             This Visit's Progress    VBCI Transitions of Care (TOC) Care Plan       Problems:  Recent Hospitalization for treatment of Pneumonia  Functional/Safety concern: Patient is a fall risk - history of stroke with left side weakness - balance issues and Home Health services barrier: mention of home health in chart - note states patient said she did not want [facility] but there was no other mention- secure chat message was sent to the nurse/CM at the hospital and no response. Patient states she is being seen tomorrow at PCP office and agrees to make MD aware and discuss with PCP ordering Home Health PT/OT if she hasn't heard from an agency    Goal:  Over the next 30 days, the patient will not experience hospital readmission  Interventions:  Transitions of Care: Doctor Visits  - discussed the importance of doctor visits   Chronic Kidney Disease Interventions: Patient has appt 04/20/24 with nephrologist, Dr Dennise at [facility] Assessed the Patient understanding of chronic kidney disease    Reviewed medications  with patient and discussed importance of compliance    Reviewed scheduled/upcoming provider appointments including    Discussed plans with patient for ongoing care management follow up and provided patient with direct contact information for care management team    Assessed social determinant of health barriers    Last practice recorded BP readings:  BP Readings from Last 3 Encounters:  04/11/24 (!) 143/47  04/08/24 109/65  03/19/24 (!) 121/58   Most recent eGFR/CrCl: No results found for: EGFR  No components found for: CRCL    Falls Interventions: Reviewed medications and discussed potential side effects of medications such as dizziness and frequent urination Advised patient of importance of notifying provider of falls  Patient Self Care Activities:  Attend all scheduled provider appointments Call pharmacy for medication refills 3-7 days in advance of running out of medications Call provider office for new concerns or questions  Notify RN Care Manager of TOC call rescheduling needs Participate in Transition of Care Program/Attend TOC scheduled calls Take medications as prescribed    Plan:  Telephone follow up appointment with care management team member scheduled for:  04/21/24 in the afternoon The patient has been provided with contact information for the care management team and has been advised to call with any health related questions or  concerns.         Shona Prow RN, CCM [place]  VBCI-Population Health RN Care Manager [phone]

## 2024-04-13 NOTE — Patient Instructions (Signed)
 Visit Information  Thank you for taking time to visit with me today. Please don't hesitate to contact me if I can be of assistance to you before our next scheduled telephone appointment.  Our next appointment is by telephone on 04/21/24 in the afternoon   Following is a copy of your care plan:   Goals Addressed             This Visit's Progress    VBCI Transitions of Care (TOC) Care Plan       Problems:  Recent Hospitalization for treatment of Pneumonia  Functional/Safety concern: Patient is a fall risk - history of stroke with left side weakness - balance issues and Home Health services barrier: mention of home health in chart - note states patient said she did not want [facility] but there was no other mention- secure chat message was sent to the nurse/CM at the hospital and no response. Patient states she is being seen tomorrow at PCP office and agrees to make MD aware and discuss with PCP ordering Home Health PT/OT if she hasn't heard from an agency    Goal:  Over the next 30 days, the patient will not experience hospital readmission  Interventions:  Transitions of Care: Doctor Visits  - discussed the importance of doctor visits   Chronic Kidney Disease Interventions: Patient has appt 04/20/24 with nephrologist, Dr Dennise at [facility] Assessed the Patient understanding of chronic kidney disease    Reviewed medications with patient and discussed importance of compliance    Reviewed scheduled/upcoming provider appointments including    Discussed plans with patient for ongoing care management follow up and provided patient with direct contact information for care management team    Assessed social determinant of health barriers    Last practice recorded BP readings:  BP Readings from Last 3 Encounters:  04/11/24 (!) 143/47  04/08/24 109/65  03/19/24 (!) 121/58   Most recent eGFR/CrCl: No results found for: EGFR  No components found for: CRCL    Falls  Interventions: Reviewed medications and discussed potential side effects of medications such as dizziness and frequent urination Advised patient of importance of notifying provider of falls  Patient Self Care Activities:  Attend all scheduled provider appointments Call pharmacy for medication refills 3-7 days in advance of running out of medications Call provider office for new concerns or questions  Notify RN Care Manager of TOC call rescheduling needs Participate in Transition of Care Program/Attend TOC scheduled calls Take medications as prescribed    Plan:  Telephone follow up appointment with care management team member scheduled for:  04/21/24 in the afternoon The patient has been provided with contact information for the care management team and has been advised to call with any health related questions or concerns.         Patient verbalizes understanding of instructions and care plan provided today and agrees to view in MyChart. Active MyChart status and patient understanding of how to access instructions and care plan via MyChart confirmed with patient.     Telephone follow up appointment with care management team member scheduled for:04/21/24 The patient has been provided with contact information for the care management team and has been advised to call with any health related questions or concerns.   Please call the care guide team at [phone] if you need to cancel or reschedule your appointment.   Please call the Suicide and Crisis Lifeline: 988 call 1-800-273-TALK (toll free, 24 hour hotline) call 911 if you are  experiencing a Mental Health or Behavioral Health Crisis or need someone to talk to.  Shona Prow RN, CCM [place]  VBCI-Population Health RN Care Manager [phone]

## 2024-04-14 ENCOUNTER — Encounter: Payer: Self-pay | Admitting: Nurse Practitioner

## 2024-04-14 ENCOUNTER — Ambulatory Visit: Admitting: Nurse Practitioner

## 2024-04-14 VITALS — BP 112/64 | HR 76 | Temp 97.7°F | Ht <= 58 in | Wt 135.8 lb

## 2024-04-14 DIAGNOSIS — R8281 Pyuria: Secondary | ICD-10-CM | POA: Diagnosis not present

## 2024-04-14 DIAGNOSIS — R058 Other specified cough: Secondary | ICD-10-CM

## 2024-04-14 DIAGNOSIS — R2689 Other abnormalities of gait and mobility: Secondary | ICD-10-CM

## 2024-04-14 DIAGNOSIS — N2581 Secondary hyperparathyroidism of renal origin: Secondary | ICD-10-CM | POA: Diagnosis not present

## 2024-04-14 DIAGNOSIS — D631 Anemia in chronic kidney disease: Secondary | ICD-10-CM | POA: Diagnosis not present

## 2024-04-14 DIAGNOSIS — I10 Essential (primary) hypertension: Secondary | ICD-10-CM | POA: Diagnosis not present

## 2024-04-14 DIAGNOSIS — E1122 Type 2 diabetes mellitus with diabetic chronic kidney disease: Secondary | ICD-10-CM | POA: Diagnosis not present

## 2024-04-14 DIAGNOSIS — N1832 Chronic kidney disease, stage 3b: Secondary | ICD-10-CM | POA: Diagnosis not present

## 2024-04-14 MED ORDER — BENZONATATE 100 MG PO CAPS: 100.0000 mg | ORAL_CAPSULE | Freq: Two times a day (BID) | ORAL | 0 refills | Status: AC | PRN

## 2024-04-14 NOTE — Progress Notes (Signed)
 Established Patient Office Visit  Subjective:  Patient ID: Crystal Miller, female    DOB: 09-04-1945  Age: 78 y.o. MRN: [id number]  CC:  Chief Complaint  Patient presents with   Hospitalization Follow-up   Discussed the use of AI scribe software for clinical note transcription with the patient, who gave verbal consent to proceed.  History of Present Illness  Discussed the use of AI scribe software for clinical note transcription with the patient, who gave verbal consent to proceed.  History of Present Illness   Crystal Miller is a 78 year old female who presents for a follow-up after hospital discharge for pneumonia.  She was admitted on April 08, 2024, for a month-long cough with fatigue, low-grade fever, and congestion, and discharged on April 11, 2024. She completed her antibiotic course, with the last dose yesterday. Since discharge she feels stronger and overall improved. The cough is better but still present. She denies shortness of breath or chest pain but had rib pain from coughing. She has not taken cough medication since discharge and is requesting something to help manage the remaining cough.    Past Medical History:  Diagnosis Date   Acute pyelonephritis 08/01/2018   Anemia in chronic kidney disease    C. 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 does not drive Lt side weakness   Transaminitis 08/17/2023   Type 2 diabetes mellitus with diabetic chronic kidney disease [facility])     Past Surgical History:  Procedure Laterality Date   CATARACT EXTRACTION W/PHACO Right 03/03/2024   Procedure: PHACOEMULSIFICATION, CATARACT, WITH IOL INSERTION  7.68  01:07.3;  Surgeon: Jaye Fallow, MD;  Location: [facility];  Service: Ophthalmology;  Laterality: Right;   KIDNEY STONE SURGERY     LOWER EXTREMITY ANGIOGRAPHY Left 12/31/2017   Procedure: LOWER EXTREMITY ANGIOGRAPHY;  Surgeon: Jama Cordella MATSU, MD;  Location: [facility] INVASIVE CV LAB;  Service: Cardiovascular;  Laterality: Left;   LOWER EXTREMITY ANGIOGRAPHY Right 04/22/2018   Procedure: LOWER EXTREMITY ANGIOGRAPHY;  Surgeon: Jama Cordella MATSU, MD;  Location: [facility] INVASIVE CV LAB;  Service: Cardiovascular;  Laterality: Right;    Family History  Problem Relation Age of Onset   Alzheimer's disease Mother    Arthritis Mother    Hypertension Mother    CVA Father    CAD Father    Heart disease Father    Stroke Father    Hypertension Father    Arthritis Sister    Stroke Brother    Diabetes Brother    Heart disease Brother    Bone cancer Brother    Lung cancer Brother    Diabetes Son    Breast cancer Neg Hx     Social History   Socioeconomic History   Marital status: Widowed    Spouse name: Not on  file   Number of children: 3   Years of education: Not on file   Highest education level: Not on file  Occupational History   Occupation: higher education careers adviser    Comment: retired   Occupation: retired  Tobacco Use   Smoking status: Former    Current packs/day: 0.00    Average packs/day: 1 pack/day for 20.0 years (20.0 ttl pk-yrs)    Types: Cigarettes    Start date: 45    Quit date: 2012    Years since quitting: 13.9   Smokeless tobacco: Never  Vaping Use   Vaping status: Never Used  Substance and Sexual Activity   Alcohol  use: No   Drug use: No   Sexual activity: Not Currently    Birth control/protection: Post-menopausal  Other Topics Concern   Not on file  Social History Narrative   Not on file   Social Drivers of Health   Financial Resource Strain: Low Risk  (12/06/2023)   Overall Financial Resource Strain (CARDIA)    Difficulty of Paying Living Expenses: Not hard at all  Food Insecurity: No Food Insecurity (04/13/2024)   Hunger Vital Sign    Worried About Running Out of Food in the Last Year: Never true    Ran Out of Food in the Last Year: Never true  Transportation Needs: No Transportation Needs (04/13/2024)   PRAPARE - Administrator, Civil Service (Medical): No    Lack of Transportation (Non-Medical): No  Physical Activity: Sufficiently Active (12/06/2023)   Exercise Vital Sign    Days of Exercise per Week: 5 days    Minutes of Exercise per Session: 30 min  Stress: No Stress Concern Present (12/06/2023)   [employer] of Occupational Health - Occupational Stress Questionnaire    Feeling of Stress: Only a little  Social Connections: Moderately Isolated (04/08/2024)   Social Connection and Isolation Panel    Frequency of Communication with Friends and Family: More than three times a week    Frequency of Social Gatherings with Friends and Family: More than three times a week    Attends Religious Services: More than 4 times per year    Active Member of [employer] or Organizations: No    Attends Banker Meetings: Never    Marital Status: Widowed  Intimate Partner Violence: Not At Risk (04/13/2024)   Humiliation, Afraid, Rape, and Kick questionnaire    Fear of Current or Ex-Partner: No    Emotionally Abused: No    Physically Abused: No    Sexually Abused: No     Outpatient Medications Prior to Visit  Medication Sig Dispense Refill   acetaminophen  (TYLENOL ) 500 MG tablet Take 1,000 mg by mouth every 6 (six) hours as needed (for pain.).     amLODipine  (NORVASC ) 10 MG  tablet Take 1 tablet (10 mg total) by mouth daily. 90 tablet 3   aspirin  EC 81 MG EC tablet Take 1 tablet (81 mg total) by mouth daily. 30 tablet 2   atorvastatin  (LIPITOR) 40 MG tablet TAKE 1 TABLET BY MOUTH ONCE DAILY AT  6PM 90 tablet 3   b complex-vitamin c-folic acid  (NEPHRO-VITE) 0.8 MG TABS tablet Take 1 tablet by mouth daily.     blood glucose meter kit and supplies KIT Dispense based on patient and insurance preference. Use up to four times daily as directed. (FOR ICD-9 250.00, 250.01). 1 each 0   carvedilol  (COREG ) 6.25 MG tablet Take 1 tablet (6.25 mg total) by mouth 2 (  two) times daily with a meal. 180 tablet 3   cyanocobalamin  (VITAMIN B12) 1000 MCG tablet Take 1,000 mcg by mouth daily.     diclofenac  Sodium (VOLTAREN ) 1 % GEL Apply 2 g topically daily as needed.     Dulaglutide  (TRULICITY ) 0.75 MG/0.5ML SOAJ Inject 0.75 mg into the skin once a week. 6 mL 1   omeprazole  (PRILOSEC) 20 MG capsule Take 1 capsule (20 mg total) by mouth daily. 90 capsule 3   oxybutynin  (DITROPAN -XL) 5 MG 24 hr tablet 5 mg every evening.     sertraline  (ZOLOFT ) 25 MG tablet Take 1 tablet (25 mg total) by mouth daily. 90 tablet 3   No facility-administered medications prior to visit.    Allergies  Allergen Reactions   Metformin  And Related Diarrhea    ROS Review of Systems Negative unless indicated in HPI.    Objective:    Physical Exam Constitutional:      Appearance: Normal appearance.  HENT:     Mouth/Throat:     Mouth: Mucous membranes are moist.  Eyes:     Conjunctiva/sclera: Conjunctivae normal.     Pupils: Pupils are equal, round, and reactive to light.  Cardiovascular:     Rate and Rhythm: Normal rate and regular rhythm.     Pulses: Normal pulses.     Heart sounds: Normal heart sounds.  Pulmonary:     Effort: Pulmonary effort is normal.     Breath sounds: Normal breath sounds.  Musculoskeletal:     Cervical back: Normal range of motion. No tenderness.  Skin:    General:  Skin is warm.     Findings: No bruising.  Neurological:     General: No focal deficit present.     Mental Status: Zylah is alert and oriented to person, place, and time. Mental status is at baseline.  Psychiatric:        Mood and Affect: Mood normal.        Behavior: Behavior normal.        Thought Content: Thought content normal.        Judgment: Judgment normal.     BP 112/64   Pulse 76   Temp 97.7 F (36.5 C)   Ht 4' 10 (1.473 m)   Wt 135 lb 12.8 oz (61.6 kg)   SpO2 96%   BMI 28.38 kg/m  Wt Readings from Last 3 Encounters:  04/14/24 135 lb 12.8 oz (61.6 kg)  04/08/24 134 lb 12.8 oz (61.1 kg)  03/19/24 134 lb 12.8 oz (61.1 kg)     Health Maintenance  Topic Date Due   Diabetic kidney evaluation - Urine ACR  Never done   Zoster Vaccines- Shingrix (1 of 2) Never done   OPHTHALMOLOGY EXAM  02/14/2024   HEMOGLOBIN A1C  07/30/2024   COVID-19 Vaccine (6 - Pfizer risk 2025-26 season) 08/04/2024   FOOT EXAM  11/17/2024   Medicare Annual Wellness (AWV)  12/05/2024   Diabetic kidney evaluation - eGFR measurement  04/11/2025   DTaP/Tdap/Td (2 - Td or Tdap) 02/03/2031   Pneumococcal Vaccine: 50+ Years  Completed   Influenza Vaccine  Completed   Bone Density Scan  Completed   Meningococcal B Vaccine  Aged Out   Lung Cancer Screening  Discontinued   Mammogram  Discontinued   Fecal DNA (Cologuard)  Discontinued    There are no preventive care reminders to display for this patient.  Lab Results  Component Value Date   TSH 5.80 (H) 01/31/2024   Lab  Results  Component Value Date   WBC 9.7 04/11/2024   HGB 9.2 (L) 04/11/2024   HCT 27.6 (L) 04/11/2024   MCV 91.7 04/11/2024   PLT 360 04/11/2024   Lab Results  Component Value Date   NA 139 04/11/2024   K 4.6 04/11/2024   CO2 21 (L) 04/11/2024   GLUCOSE 111 (H) 04/11/2024   BUN 25 (H) 04/11/2024   CREATININE 1.28 (H) 04/11/2024   BILITOT 0.3 04/09/2024   ALKPHOS 101 04/09/2024   AST 16 04/09/2024   ALT 12  04/09/2024   PROT 6.4 (L) 04/09/2024   ALBUMIN 3.1 (L) 04/11/2024   CALCIUM  8.8 (L) 04/11/2024   ANIONGAP 9 04/11/2024   GFR 31.26 (L) 09/16/2023   Lab Results  Component Value Date   CHOL 96 01/31/2024   Lab Results  Component Value Date   HDL 34.40 (L) 01/31/2024   Lab Results  Component Value Date   LDLCALC 34 01/31/2024   Lab Results  Component Value Date   TRIG 138.0 01/31/2024   Lab Results  Component Value Date   CHOLHDL 3 01/31/2024   Lab Results  Component Value Date   HGBA1C 6.1 01/31/2024      Assessment & Plan:   Assessment & Plan Other cough Recently treated pneumonia with persistent cough. Completed antibiotics. Cough improved but persistent. - Prescribed benzonatate  for cough.  - Advised to report if symptoms worsen.        Balance problem - Referred to [facility] [place] for physical therapy. Orders:   Ambulatory referral to Home Health   Assessment and Plan Assessment & Plan    Follow-up: No follow-ups on file.   Lynnita Somma, NP

## 2024-04-19 DIAGNOSIS — R059 Cough, unspecified: Secondary | ICD-10-CM | POA: Insufficient documentation

## 2024-04-19 DIAGNOSIS — R2689 Other abnormalities of gait and mobility: Secondary | ICD-10-CM | POA: Insufficient documentation

## 2024-04-19 NOTE — Assessment & Plan Note (Signed)
-   Referred to [facility] for physical therapy. Orders:   Ambulatory referral to Home Health

## 2024-04-19 NOTE — Assessment & Plan Note (Addendum)
 Recently treated pneumonia with persistent cough. Completed antibiotics. Cough improved but persistent. - Prescribed benzonatate  for cough.  - Advised to report if symptoms worsen.

## 2024-04-20 DIAGNOSIS — Z7985 Long-term (current) use of injectable non-insulin antidiabetic drugs: Secondary | ICD-10-CM | POA: Diagnosis not present

## 2024-04-20 DIAGNOSIS — N2581 Secondary hyperparathyroidism of renal origin: Secondary | ICD-10-CM | POA: Diagnosis not present

## 2024-04-20 DIAGNOSIS — D631 Anemia in chronic kidney disease: Secondary | ICD-10-CM | POA: Diagnosis not present

## 2024-04-20 DIAGNOSIS — I10 Essential (primary) hypertension: Secondary | ICD-10-CM | POA: Diagnosis not present

## 2024-04-20 DIAGNOSIS — N1832 Chronic kidney disease, stage 3b: Secondary | ICD-10-CM | POA: Diagnosis not present

## 2024-04-20 DIAGNOSIS — E1122 Type 2 diabetes mellitus with diabetic chronic kidney disease: Secondary | ICD-10-CM | POA: Diagnosis not present

## 2024-04-21 ENCOUNTER — Telehealth: Payer: Self-pay

## 2024-04-21 NOTE — Transitions of Care (Post Inpatient/ED Visit) (Signed)
 Transition of Care week 2  Visit Note  04/21/2024  Name: Crystal Miller MRN: [id number]          DOB: 1946/01/04  Situation: Patient enrolled in [facility] 30-day program. Visit completed with patient by telephone.   Background: Admit/Discharge Date:   11/19 - 11/22 [facility]   Primary Diagnosis: Pneumonia  - Per 04/21/24 call - patient states she is feeling 80% back to baseline  Initial Transition Care Management Follow-up Telephone Call Discharge Date and Diagnosis: 04/11/24, Pneumonia   Past Medical History:  Diagnosis Date   Acute pyelonephritis 08/01/2018   Anemia in chronic kidney disease    C. 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 does not drive Lt side weakness   Transaminitis 08/17/2023   Type 2 diabetes mellitus with diabetic chronic kidney disease (HCC)     Assessment: Patient Reported Symptoms: Cognitive Cognitive Status: No symptoms reported, Normal speech and language skills, Alert and oriented to person, place, and time      Neurological Neurological Review of Symptoms: No symptoms reported    HEENT HEENT Symptoms Reported: No symptoms reported      Cardiovascular Cardiovascular Symptoms Reported: No symptoms reported    Respiratory Respiratory Symptoms Reported: No symptoms reported    Endocrine Endocrine  Symptoms Reported: No symptoms reported Is patient diabetic?: Yes Is patient checking blood sugars at home?: No    Gastrointestinal Gastrointestinal Symptoms Reported: No symptoms reported      Genitourinary Genitourinary Symptoms Reported: Incontinence    Integumentary Integumentary Symptoms Reported: Bruising Other Integumentary Symptoms: patient states the bruising comes and goes    Musculoskeletal Musculoskelatal Symptoms Reviewed: Weakness, Unsteady gait Additional Musculoskeletal Details: uses rollator        Psychosocial           There were no vitals filed for this visit. Pain Scale: 0-10 Pain Score: 0-No pain  Medications Reviewed Today     Reviewed by Lauro Shona LABOR, RN (Registered Nurse) on 04/21/24 at 1551  Med List Status: <None>   Medication Order Taking? Sig Documenting Provider Last Dose Status Informant  acetaminophen  (TYLENOL ) 500 MG tablet [id number] Yes Take 1,000 mg by mouth every 6 (six) hours as needed (for pain.). [provider]  Active Self           Med Note SANDOR, CATHERINE E   Mon Sep 07, 2019  1:21 PM)    amLODipine  (NORVASC ) 10 MG tablet [id number] Yes Take 1 tablet (10 mg total) by mouth daily. Abbey Bruckner, MD  Active Self  aspirin  EC 81 MG EC tablet [id number] Yes Take 1 tablet (81 mg total) by mouth daily. Vachhani, Vaibhavkumar, MD  Active Self  atorvastatin  (LIPITOR) 40 MG tablet [id number] Yes TAKE 1 TABLET BY MOUTH ONCE DAILY AT  6PM Bair, Kalpana, MD  Active  Self  b complex-vitamin c-folic acid  (NEPHRO-VITE) 0.8 MG TABS tablet [id number] Yes Take 1 tablet by mouth daily. [provider]  Active   benzonatate  (TESSALON ) 100 MG capsule [id number] Yes Take 1 capsule (100 mg total) by mouth 2 (two) times daily as needed for cough. Vincente Saber, NP  Active   blood glucose meter kit and supplies KIT [id number]  Dispense based on patient and insurance preference. Use up to four times daily as directed. (FOR ICD-9 250.00,  250.01).  Patient not taking: Reported on 04/21/2024   Yvonna Alm Cough, MD  Active Self  carvedilol  (COREG ) 6.25 MG tablet [id number] Yes Take 1 tablet (6.25 mg total) by mouth 2 (two) times daily with a meal. Bair, Kalpana, MD  Active Self  cyanocobalamin  (VITAMIN B12) 1000 MCG tablet [id number]  Take 1,000 mcg by mouth daily. [provider]  Active   diclofenac  Sodium (VOLTAREN ) 1 % GEL [id number] Yes Apply 2 g topically daily as needed. Jens Durand, MD  Active   Dulaglutide  (TRULICITY ) 0.75 MG/0.5ML EMMANUEL [id number] Yes Inject 0.75 mg into the skin once a week. Bair, Luke, MD  Active Self  omeprazole  (PRILOSEC) 20 MG capsule [id number] Yes Take 1 capsule (20 mg total) by mouth daily. Zehr, Jessica D, PA-C  Active Self  oxybutynin  (DITROPAN -XL) 5 MG 24 hr tablet [id number] Yes 5 mg every evening. [provider]  Active Self  sertraline  (ZOLOFT ) 25 MG tablet [id number] Yes Take 1 tablet (25 mg total) by mouth daily. Abbey Luke, MD  Active Self            Recommendation:   Continue Current Plan of Care  Follow Up Plan:   Telephone follow up appointment date/time:  04/28/24 in the afternoon   Shona Prow RN, CCM [place]  VBCI-Population Health RN Care Manager [phone]

## 2024-04-21 NOTE — Telephone Encounter (Signed)
 Copied from CRM [id number]. Topic: Clinical - Home Health Verbal Orders >> Apr 21, 2024  4:15 PM Thersia BROCKS wrote: Caller/Agency: [facility]  Callback Number: [phone] Service Requested: Physical Therapy Frequency: 1 time a week for 7 weeks  Any new concerns about the patient? No

## 2024-04-21 NOTE — Patient Instructions (Signed)
 Visit Information  Thank you for taking time to visit with me today. Please don't hesitate to contact me if I can be of assistance to you before our next scheduled telephone appointment.  Our next appointment is by telephone on 04/28/24 in the afternoon   Following is a copy of your care plan:   Goals Addressed             This Visit's Progress    VBCI Transitions of Care (TOC) Care Plan       Problems:  Recent Hospitalization for treatment of Pneumonia  Functional/Safety concern: Patient is a fall risk - history of stroke with left side weakness - balance issues and Home Health services barrier: mention of home health in chart - note states patient said she did not want [facility] but there was no other mention- secure chat message was sent to the nurse/CM at the hospital and no response. Patient states she is being seen tomorrow at PCP office and agrees to make MD aware and discuss with PCP ordering Home Health PT/OT if she hasn't heard from an agency  - Home health was ordered PT saw patient today 04/21/24   Goal:  Over the next 30 days, the patient will not experience hospital readmission  Interventions:  Transitions of Care: Doctor Visits  - discussed the importance of doctor visits   Chronic Kidney Disease Interventions: Patient has appt 04/20/24 with nephrologist, Dr Dennise at [facility]  -  per 04/21/24 call - patient confirms she saw provider and next visit is 3 months  Assessed the Patient understanding of chronic kidney disease    Reviewed medications with patient and discussed importance of compliance    Reviewed scheduled/upcoming provider appointments including    Discussed plans with patient for ongoing care management follow up and provided patient with direct contact information for care management team    Assessed social determinant of health barriers    Last practice recorded BP readings:  BP Readings from Last 3 Encounters:  04/11/24 (!) 143/47  04/08/24 109/65   03/19/24 (!) 121/58   Most recent eGFR/CrCl: No results found for: EGFR  No components found for: CRCL    Falls Interventions: Reviewed medications and discussed potential side effects of medications such as dizziness and frequent urination Advised patient of importance of notifying provider of falls  Patient Self Care Activities:  Attend all scheduled provider appointments Call pharmacy for medication refills 3-7 days in advance of running out of medications Call provider office for new concerns or questions  Notify RN Care Manager of TOC call rescheduling needs Participate in Transition of Care Program/Attend TOC scheduled calls Take medications as prescribed    Plan:  Telephone follow up appointment with care management team member scheduled for:  04/28/24 in the afternoon The patient has been provided with contact information for the care management team and has been advised to call with any health related questions or concerns.         Patient verbalizes understanding of instructions and care plan provided today and agrees to view in MyChart. Active MyChart status and patient understanding of how to access instructions and care plan via MyChart confirmed with patient.     Telephone follow up appointment with care management team member scheduled for: 04/28/24 The patient has been provided with contact information for the care management team and has been advised to call with any health related questions or concerns.   Please call the care guide team at [phone] if  you need to cancel or reschedule your appointment.   Please call the Suicide and Crisis Lifeline: 988 call 1-800-273-TALK (toll free, 24 hour hotline) call 911 if you are experiencing a Mental Health or Behavioral Health Crisis or need someone to talk to.  Shona Prow RN, CCM [place]  VBCI-Population Health RN Care Manager [phone]

## 2024-04-21 NOTE — Telephone Encounter (Signed)
 Please provide verbal order for PT.   Luke Shade, MD

## 2024-04-22 NOTE — Telephone Encounter (Signed)
 Spoke with Soni to provide the verbal order for PT. Soni verbalized understanding and has no further questions.

## 2024-04-28 ENCOUNTER — Telehealth: Payer: Self-pay

## 2024-04-28 NOTE — Patient Instructions (Signed)
 Visit Information  Thank you for taking time to visit with me today. Please don't hesitate to contact me if I can be of assistance to you before our next scheduled telephone appointment.  Our next appointment is by telephone on 05/06/24 in the afternoon  Following is a copy of your care plan:   Goals Addressed             This Visit's Progress    VBCI Transitions of Care (TOC) Care Plan       Problems:  Recent Hospitalization for treatment of Pneumonia  Functional/Safety concern: Patient is a fall risk - history of stroke with left side weakness - balance issues and Home Health services barrier: mention of home health in chart - note states patient said she did not want [facility] but there was no other mention- secure chat message was sent to the nurse/CM at the hospital and no response. Patient states she is being seen tomorrow at PCP office and agrees to make MD aware and discuss with PCP ordering Home Health PT/OT if she hasn't heard from an agency  - Home health was ordered PT saw patient today 04/21/24   Goal:  Over the next 30 days, the patient will not experience hospital readmission  Interventions:  Transitions of Care: Doctor Visits  - discussed the importance of doctor visits 04/28/24: Patient reports [facility] Nurse was there and her sugar was 139 and states PT has been there and is coming back this week. Patient reports she is feeling good today and denies any issues at this time- [facility] RN discussed care gaps including Opthalmology exam, which patient states she had a couple months ago at [facility] in [place]. TOC RN also reviewed shingles vaccine.      Chronic Kidney Disease Interventions: Patient has appt 04/20/24 with nephrologist, Dr Dennise at [facility]  -  per 04/21/24 call - patient confirms she saw provider and next visit is 3 months  Assessed the Patient understanding of chronic kidney disease    Reviewed medications with patient and discussed importance  of compliance    Reviewed scheduled/upcoming provider appointments including    Discussed plans with patient for ongoing care management follow up and provided patient with direct contact information for care management team    Assessed social determinant of health barriers    Last practice recorded BP readings:  BP Readings from Last 3 Encounters:  04/11/24 (!) 143/47  04/08/24 109/65  03/19/24 (!) 121/58   Most recent eGFR/CrCl: No results found for: EGFR  No components found for: CRCL    Falls Interventions: Reviewed medications and discussed potential side effects of medications such as dizziness and frequent urination Advised patient of importance of notifying provider of falls  Patient Self Care Activities:  Attend all scheduled provider appointments Call pharmacy for medication refills 3-7 days in advance of running out of medications Call provider office for new concerns or questions  Notify RN Care Manager of TOC call rescheduling needs Participate in Transition of Care Program/Attend TOC scheduled calls Take medications as prescribed    Plan:  Telephone follow up appointment with care management team member scheduled for:  05/06/24 in the afternoon The patient has been provided with contact information for the care management team and has been advised to call with any health related questions or concerns.         Patient verbalizes understanding of instructions and care plan provided today and agrees to view in MyChart. Active MyChart status  and patient understanding of how to access instructions and care plan via MyChart confirmed with patient.     Telephone follow up appointment with care management team member scheduled for: 05/06/24 The patient has been provided with contact information for the care management team and has been advised to call with any health related questions or concerns.   Please call the care guide team at [phone] if you need to cancel  or reschedule your appointment.   Please call the Suicide and Crisis Lifeline: 988 call 1-800-273-TALK (toll free, 24 hour hotline) call 911 if you are experiencing a Mental Health or Behavioral Health Crisis or need someone to talk to.  Shona Prow RN, CCM [place]  VBCI-Population Health RN Care Manager [phone]

## 2024-04-28 NOTE — Transitions of Care (Post Inpatient/ED Visit) (Signed)
  Transition of Care week 3  Visit Note  04/28/2024  Name: Crystal Miller MRN: [id number]          DOB: 03/04/1946  Situation: Patient enrolled in [facility] 30-day program. Visit completed with patient by telephone.   Background: Admit/Discharge Date:   11/19 - 11/22 [facility] Primary Diagnosis: Pneumonia   Initial Transition Care Management Follow-up Telephone Call Discharge Date and Diagnosis: 04/11/24, Pneumonia   Past Medical History:  Diagnosis Date   Acute pyelonephritis 08/01/2018   Anemia in chronic kidney disease    C. 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 does not drive Lt side weakness   Transaminitis 08/17/2023   Type 2 diabetes mellitus with diabetic chronic kidney disease (HCC)     Assessment: Patient Reported Symptoms: Cognitive Cognitive Status: No symptoms reported, Normal speech and language skills, Alert and oriented to person, place, and time      Neurological Neurological Review of Symptoms: Headaches (patient reports a slight headache Sunday and took Tylenol  which helped) Neurological Self-Management Outcome: 4 (good)  HEENT HEENT Symptoms Reported: No symptoms reported      Cardiovascular Cardiovascular Symptoms Reported: No symptoms reported    Respiratory Respiratory Symptoms Reported: No symptoms  reported Other Respiratory Symptoms: patient states [facility] checked her O2 sat and reports it was 98% on room air    Endocrine Endocrine Symptoms Reported: No symptoms reported Is patient diabetic?: Yes Is patient checking blood sugars at home?: No Endocrine Self-Management Outcome: 4 (good) Endocrine Comment: patient reports [facility] nurse came last week and sugar was 139  Gastrointestinal Gastrointestinal Symptoms Reported: No symptoms reported      Genitourinary Genitourinary Symptoms Reported: Incontinence, Other Other Genitourinary Symptoms: patient has overactive bladder and states she has bedside commode for nights and usually makes it to the bathroom    Integumentary Integumentary Symptoms Reported: Bruising Other Integumentary Symptoms: patient states small bruising on arms    Musculoskeletal Musculoskelatal Symptoms Reviewed: Unsteady gait Additional Musculoskeletal Details: patient states I use the rollator all the time now. I don't take no chances Musculoskeletal Management Strategies: Medical device Musculoskeletal Self-Management Outcome: 4 (good)      Psychosocial Psychosocial Symptoms Reported: No symptoms reported         There were no vitals filed for this visit. Pain Scale: 0-10 Pain Score: 0-No pain  Medications Reviewed Today   Medications were not reviewed in this encounter     Recommendation:   Continue Current Plan of Care  Follow Up Plan:   Telephone follow up appointment date/time:  05/06/24 in the afternoon  Shona Prow RN, CCM [place]  VBCI-Population Health RN Care Manager [phone]

## 2024-05-08 ENCOUNTER — Telehealth: Payer: Self-pay

## 2024-05-08 NOTE — Transitions of Care (Post Inpatient/ED Visit) (Signed)
 " Transition of Care week 4  Visit Note  05/08/2024  Name: Crystal Miller MRN: [id number]          DOB: 11-21-1945  Situation: Patient disenrolled in [facility] 30-day program. Visit completed with patient by telephone.   Background: Admit/Discharge Date:   11/19 - 11/22 [facility] Primary Diagnosis: Pneumonia   Initial Transition Care Management Follow-up Telephone Call Discharge Date and Diagnosis: 04/11/24, Pneumonia   Past Medical History:  Diagnosis Date   Acute pyelonephritis 08/01/2018   Anemia in chronic kidney disease    C. 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 does not drive Lt side weakness   Transaminitis 08/17/2023   Type 2 diabetes mellitus with diabetic chronic kidney disease (HCC)     Assessment: Patient Reported Symptoms: Cognitive Cognitive Status: No symptoms reported, Normal speech and language skills, Alert and oriented to person, place, and time      Neurological Neurological Review of Symptoms: No symptoms reported    HEENT HEENT Symptoms Reported: No symptoms reported      Cardiovascular Cardiovascular Symptoms Reported: No symptoms reported    Respiratory Respiratory Symptoms Reported: No symptoms reported    Endocrine Endocrine Symptoms Reported: No symptoms reported    Gastrointestinal  Gastrointestinal Symptoms Reported: No symptoms reported      Genitourinary Genitourinary Symptoms Reported: Incontinence    Integumentary Integumentary Symptoms Reported: Bruising Other Integumentary Symptoms: reports improved    Musculoskeletal          Psychosocial Psychosocial Symptoms Reported: No symptoms reported (Patient states, It's been a good week - family member she hasn't seen in a year is visiting)         There were no vitals filed for this visit. Pain Scale: 0-10 Pain Score: 0-No pain  Medications Reviewed Today     Reviewed by Lauro Shona LABOR, RN (Registered Nurse) on 05/08/24 at [phone]  Med List Status: <None>   Medication Order Taking? Sig Documenting Provider Last Dose Status Informant  acetaminophen  (TYLENOL ) 500 MG tablet [id number] Yes Take 1,000 mg by mouth every 6 (six) hours as needed (for pain.). [provider]  Active Self           Med Note SANDOR, CATHERINE E   Mon Sep 07, 2019  1:21 PM)    amLODipine  (NORVASC ) 10 MG tablet [id number] Yes Take 1 tablet (10 mg total) by mouth daily. Bair, Luke, MD  Active Self  aspirin  EC 81 MG EC tablet [id number] Yes Take 1 tablet (81 mg total) by mouth daily. Vachhani, Vaibhavkumar, MD  Active Self  atorvastatin  (LIPITOR) 40 MG tablet [id number] Yes TAKE 1 TABLET BY MOUTH ONCE DAILY AT  [facility], Kalpana, MD  Active Self  b complex-vitamin c-folic acid  (NEPHRO-VITE) 0.8 MG TABS tablet [id number] Yes Take 1 tablet by mouth daily.  [provider]  Active   benzonatate  (TESSALON ) 100 MG capsule [id number]  Take 1 capsule (100 mg total) by mouth 2 (two) times daily as needed for cough.  Patient not taking: Reported on 05/08/2024   Vincente Saber, NP  Active   blood glucose meter kit and supplies KIT [id number]  Dispense based on patient and insurance preference. Use up to four times daily as directed. (FOR ICD-9 250.00, 250.01).  Patient not taking: Reported on 05/08/2024   Yvonna Alm Cough, MD  Active Self  carvedilol  (COREG ) 6.25 MG tablet [id number] Yes Take 1 tablet (6.25 mg total) by mouth 2 (two) times daily with a meal. Bair, Kalpana, MD  Active Self  cyanocobalamin  (VITAMIN B12) 1000 MCG tablet [id number] Yes Take 1,000 mcg by mouth daily. [provider]  Active   diclofenac  Sodium (VOLTAREN ) 1 % GEL [id number]  Apply 2 g topically daily as needed.  Patient not taking: Reported on 04/28/2024   Jens Durand, MD  Active   Dulaglutide  (TRULICITY ) 0.75 MG/0.5ML EMMANUEL [id number] Yes Inject 0.75 mg into the skin once a week. Bair, Luke, MD  Active Self  omeprazole  (PRILOSEC) 20 MG capsule [id number] Yes Take 1 capsule (20 mg total) by mouth daily. Zehr, Jessica D, PA-C  Active Self  oxybutynin  (DITROPAN -XL) 5 MG 24 hr tablet [id number] Yes 5 mg every evening. [provider]  Active Self  sertraline  (ZOLOFT ) 25 MG tablet [id number] Yes Take 1 tablet (25 mg total) by mouth daily. Abbey Luke, MD  Active Self            Recommendation:   Follow up with providers as needed  Follow Up Plan:   Closing From:  Transitions of Care Program  Shona Prow RN, CCM [facility]  VBCI-Population Health RN Care Manager [phone]     "

## 2024-06-01 ENCOUNTER — Ambulatory Visit

## 2024-06-08 ENCOUNTER — Telehealth: Payer: Self-pay

## 2024-06-08 ENCOUNTER — Ambulatory Visit

## 2024-06-08 NOTE — Telephone Encounter (Signed)
 Patient called us  back.  I explained that I was unable to leave a message when I called her, so I spoke with her daughter, and I relayed the information from that conversation.  Patient states she had [employer] for transportation today, but they were running late, so she missed her appointment.  I let patient know, per original note stating patient would like to know about other transportation service options, that she may want to call her insurance company.  I let patient know that sometimes insurance companies will offer transportation to appointments.  Patient states she doesn't need help with transportation.  Patient states she will have her daughter, sister, or somebody to bring her to her appointment.  I had originally rescheduled appointment to 06/29/2024 at 10:30pm.  Patient states she needs an afternoon appointment, so I rescheduled her appointment to 06/30/2024 at 2:30pm.

## 2024-06-08 NOTE — Telephone Encounter (Signed)
 Copied from CRM [id number]. Topic: Appointments - Appointment Cancel/Reschedule >> Jun 08, 2024 11:33 AM Viola F wrote: Patient missed appointment today due to transportation service. She rescheduled to 07/16/24 and added to wait list. She wants to know if theres another way to get her in sooner and maybe get help with different transportation service. Please call  [phone]  I called patient's home number and there was no answer and no voicemail.  I called patient's daughter (Chrischelle Mathew - DPR) and let her know about the phone message we received.  I let her know that I was unable to reach her mother, so I'm calling her.  I let her know that Dr. Luke Shade did have an earlier appointment on 06/29/2024 at 10:30am, so I rescheduled patient to that date/time.  I let her know that they may want to call patient's insurance company to see if her plan offers assistance with transportation to her doctor appointments.

## 2024-06-08 NOTE — Telephone Encounter (Signed)
 Noted thank you for the update.  I will discuss with referral to social worker during her next visit to help with transportation.  Luke Shade, MD

## 2024-06-29 ENCOUNTER — Ambulatory Visit

## 2024-06-30 ENCOUNTER — Ambulatory Visit

## 2024-07-16 ENCOUNTER — Ambulatory Visit

## 2024-08-03 ENCOUNTER — Ambulatory Visit: Admitting: Podiatry

## 2024-10-08 ENCOUNTER — Ambulatory Visit (INDEPENDENT_AMBULATORY_CARE_PROVIDER_SITE_OTHER): Admitting: Nurse Practitioner

## 2024-10-08 ENCOUNTER — Encounter (INDEPENDENT_AMBULATORY_CARE_PROVIDER_SITE_OTHER)

## 2024-12-08 ENCOUNTER — Ambulatory Visit
